# Patient Record
Sex: Male | Born: 1955 | Race: White | Hispanic: No | Marital: Single | Smoking: Former smoker
Health system: Southern US, Community
[De-identification: ages and names within clinical notes are randomized; demographics above are authoritative.]

## PROBLEM LIST (undated history)

## (undated) DIAGNOSIS — I10 Essential (primary) hypertension: Secondary | ICD-10-CM

## (undated) DIAGNOSIS — G629 Polyneuropathy, unspecified: Secondary | ICD-10-CM

## (undated) DIAGNOSIS — I4891 Unspecified atrial fibrillation: Secondary | ICD-10-CM

## (undated) DIAGNOSIS — K7460 Unspecified cirrhosis of liver: Secondary | ICD-10-CM

## (undated) DIAGNOSIS — I639 Cerebral infarction, unspecified: Secondary | ICD-10-CM

## (undated) DIAGNOSIS — G2581 Restless legs syndrome: Secondary | ICD-10-CM

## (undated) DIAGNOSIS — N400 Enlarged prostate without lower urinary tract symptoms: Secondary | ICD-10-CM

## (undated) DIAGNOSIS — I499 Cardiac arrhythmia, unspecified: Secondary | ICD-10-CM

## (undated) DIAGNOSIS — M5126 Other intervertebral disc displacement, lumbar region: Secondary | ICD-10-CM

## (undated) DIAGNOSIS — E871 Hypo-osmolality and hyponatremia: Secondary | ICD-10-CM

## (undated) DIAGNOSIS — Z8673 Personal history of transient ischemic attack (TIA), and cerebral infarction without residual deficits: Secondary | ICD-10-CM

## (undated) DIAGNOSIS — E876 Hypokalemia: Secondary | ICD-10-CM

## (undated) DIAGNOSIS — I69359 Hemiplegia and hemiparesis following cerebral infarction affecting unspecified side: Secondary | ICD-10-CM

## (undated) DIAGNOSIS — K219 Gastro-esophageal reflux disease without esophagitis: Secondary | ICD-10-CM

## (undated) HISTORY — DX: Restless legs syndrome: G25.81

## (undated) HISTORY — DX: Unspecified atrial fibrillation: I48.91

## (undated) HISTORY — DX: Unspecified cirrhosis of liver: K74.60

## (undated) HISTORY — DX: Hemiplegia and hemiparesis following cerebral infarction affecting unspecified side: I69.359

## (undated) HISTORY — DX: Hypo-osmolality and hyponatremia: E87.1

## (undated) HISTORY — DX: Polyneuropathy, unspecified: G62.9

## (undated) HISTORY — PX: TONSILLECTOMY: SUR1361

## (undated) HISTORY — PX: FRACTURE SURGERY: SHX138

---
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
## 2002-01-11 ENCOUNTER — Inpatient Hospital Stay (HOSPITAL_COMMUNITY): Admission: EM | Admit: 2002-01-11 | Discharge: 2002-01-12 | Payer: Self-pay | Admitting: Emergency Medicine

## 2002-01-11 ENCOUNTER — Encounter: Payer: Self-pay | Admitting: Emergency Medicine

## 2002-06-22 ENCOUNTER — Emergency Department (HOSPITAL_COMMUNITY): Admission: EM | Admit: 2002-06-22 | Discharge: 2002-06-23 | Payer: Self-pay | Admitting: Emergency Medicine

## 2002-07-09 ENCOUNTER — Emergency Department (HOSPITAL_COMMUNITY): Admission: EM | Admit: 2002-07-09 | Discharge: 2002-07-09 | Payer: Self-pay | Admitting: Emergency Medicine

## 2003-05-14 ENCOUNTER — Emergency Department (HOSPITAL_COMMUNITY): Admission: AC | Admit: 2003-05-14 | Discharge: 2003-05-14 | Payer: Self-pay

## 2003-05-15 ENCOUNTER — Encounter: Payer: Self-pay | Admitting: Emergency Medicine

## 2006-03-28 ENCOUNTER — Emergency Department (HOSPITAL_COMMUNITY): Admission: EM | Admit: 2006-03-28 | Discharge: 2006-03-28 | Payer: Self-pay | Admitting: Emergency Medicine

## 2006-04-21 ENCOUNTER — Emergency Department (HOSPITAL_COMMUNITY): Admission: EM | Admit: 2006-04-21 | Discharge: 2006-04-21 | Payer: Self-pay | Admitting: Family Medicine

## 2006-12-18 ENCOUNTER — Emergency Department (HOSPITAL_COMMUNITY): Admission: EM | Admit: 2006-12-18 | Discharge: 2006-12-18 | Payer: Self-pay | Admitting: Emergency Medicine

## 2008-09-13 ENCOUNTER — Emergency Department (HOSPITAL_COMMUNITY): Admission: EM | Admit: 2008-09-13 | Discharge: 2008-09-13 | Payer: Self-pay | Admitting: Emergency Medicine

## 2011-02-22 NOTE — Discharge Summary (Signed)
[place]  Patient:    Jeffrey Randolph, SCHWERTNER Visit Number: [id number] MRN: [id number]          Service Type: EMS Location: [place] Attending Physician:  Lorre Nick Dictated by:   Guy Franco, P.A. [facility] Admit Date:  01/11/2002 Disc. Date: 01/12/02                    Referring Physician Discharge [place]  DISCHARGE DIAGNOSES: 1. Chest pain, resolved. 2. Hypertension. 3. Tachycardia. 4. History of anxiety.  HISTORY OF PRESENT ILLNESS:  [name] is a 55 year old male patient who was admitted on January 11, 2002, with substernal chest pain.  His EKG showed sinus tachycardia at a rate of 112 with no acute changes.  HOSPITAL COURSE:  He was kept overnight and ruled out with negative cardiac isoenzymes and troponins.  He is being discharged today for a stress Cardiolite in the office at 1 p.m.  He was noted to be tachycardic and hypertensive in the hospital.  DISPOSITION:  He was discharged to home.  DISCHARGE MEDICATIONS: 1. Enteric-coated aspirin 325 mg one p.o. q.d. 2. Lopressor 50 mg one p.o. b.i.d. 3. Sublingual nitroglycerin p.r.n. chest pain  ACTIVITY:  No strenuous activity.  DIET:  Remain on a low-fat diet.  SPECIAL INSTRUCTIONS:  Call for any questions or concerns.  FOLLOW-UP:  Follow up in the office on January 12, 2002, at 1 p.m. for a stress Cardiolite.  He has a follow-up appointment on January 18, 2002, at 12:15 p.m. for a blood pressure check. Dictated by:   Guy Franco, P.A. LHC Attending Physician:  Lorre Nick DD:  01/12/02 TD:  01/12/02 Job: [id number]

## 2011-07-12 ENCOUNTER — Other Ambulatory Visit (HOSPITAL_COMMUNITY): Payer: Self-pay

## 2011-07-12 ENCOUNTER — Inpatient Hospital Stay (HOSPITAL_COMMUNITY): Payer: Self-pay

## 2011-07-12 ENCOUNTER — Emergency Department (HOSPITAL_COMMUNITY): Payer: Self-pay

## 2011-07-12 ENCOUNTER — Inpatient Hospital Stay (HOSPITAL_COMMUNITY)
Admission: EM | Admit: 2011-07-12 | Discharge: 2011-07-15 | DRG: 065 | Disposition: A | Discharge status: Home / Self Care | Payer: Self-pay | Source: Ambulatory Visit | Attending: Neurology | Admitting: Neurology

## 2011-07-12 DIAGNOSIS — I6789 Other cerebrovascular disease: Secondary | ICD-10-CM

## 2011-07-12 DIAGNOSIS — I9589 Other hypotension: Secondary | ICD-10-CM | POA: Diagnosis present

## 2011-07-12 DIAGNOSIS — R209 Unspecified disturbances of skin sensation: Secondary | ICD-10-CM | POA: Diagnosis present

## 2011-07-12 DIAGNOSIS — M519 Unspecified thoracic, thoracolumbar and lumbosacral intervertebral disc disorder: Secondary | ICD-10-CM | POA: Diagnosis present

## 2011-07-12 DIAGNOSIS — G8190 Hemiplegia, unspecified affecting unspecified side: Secondary | ICD-10-CM | POA: Diagnosis present

## 2011-07-12 DIAGNOSIS — I619 Nontraumatic intracerebral hemorrhage, unspecified: Principal | ICD-10-CM | POA: Diagnosis present

## 2011-07-12 DIAGNOSIS — M549 Dorsalgia, unspecified: Secondary | ICD-10-CM | POA: Diagnosis present

## 2011-07-12 LAB — POCT I-STAT, CHEM 8
BUN: <3 mg/dL — ABNORMAL LOW (ref 6–23)
Calcium, Ion: 1.01 mmol/L — ABNORMAL LOW (ref 1.12–1.32)
Chloride: 103 meq/L (ref 96–112)
Creatinine, Ser: 1 mg/dL (ref 0.50–1.35)
Glucose, Bld: 111 mg/dL — ABNORMAL HIGH (ref 70–99)
HCT: 49 % (ref 39.0–52.0)
Hemoglobin: 16.7 g/dL (ref 13.0–17.0)
Potassium: 3.6 meq/L (ref 3.5–5.1)
Sodium: 139 meq/L (ref 135–145)
TCO2: 22 mmol/L (ref 0–100)

## 2011-07-12 LAB — DIFFERENTIAL
Basophils Absolute: 0 10*3/uL (ref 0.0–0.1)
Basophils Relative: 0 % (ref 0–1)
Eosinophils Absolute: 0 10*3/uL (ref 0.0–0.7)
Eosinophils Relative: 0 % (ref 0–5)
Lymphocytes Relative: 17 % (ref 12–46)
Lymphs Abs: 1.2 10*3/uL (ref 0.7–4.0)
Monocytes Absolute: 0.6 K/uL (ref 0.1–1.0)
Monocytes Relative: 9 % (ref 3–12)
Neutro Abs: 5.3 K/uL (ref 1.7–7.7)
Neutrophils Relative %: 74 % (ref 43–77)

## 2011-07-12 LAB — CBC
HCT: 45.4 % (ref 39.0–52.0)
Hemoglobin: 15.7 g/dL (ref 13.0–17.0)
MCH: 34.5 pg — ABNORMAL HIGH (ref 26.0–34.0)
MCHC: 34.6 g/dL (ref 30.0–36.0)
MCV: 99.8 fL (ref 78.0–100.0)
Platelets: 209 10*3/uL (ref 150–400)
RBC: 4.55 MIL/uL (ref 4.22–5.81)
RDW: 13.2 % (ref 11.5–15.5)
WBC: 7.2 10*3/uL (ref 4.0–10.5)

## 2011-07-12 LAB — COMPREHENSIVE METABOLIC PANEL
AST: 45 U/L — ABNORMAL HIGH (ref 0–37)
CO2: 24 mEq/L (ref 19–32)
Calcium: 8.9 mg/dL (ref 8.4–10.5)
Creatinine, Ser: 0.62 mg/dL (ref 0.50–1.35)
GFR calc Af Amer: >90 mL/min (ref >90)
GFR calc non Af Amer: >90 mL/min (ref >90)

## 2011-07-12 LAB — COMPREHENSIVE METABOLIC PANEL WITH GFR
ALT: 29 U/L (ref 0–53)
Albumin: 3.9 g/dL (ref 3.5–5.2)
Alkaline Phosphatase: 85 U/L (ref 39–117)
BUN: 5 mg/dL — ABNORMAL LOW (ref 6–23)
Chloride: 100 meq/L (ref 96–112)
Glucose, Bld: 110 mg/dL — ABNORMAL HIGH (ref 70–99)
Potassium: 3.6 meq/L (ref 3.5–5.1)
Sodium: 137 meq/L (ref 135–145)
Total Bilirubin: 0.4 mg/dL (ref 0.3–1.2)
Total Protein: 7.8 g/dL (ref 6.0–8.3)

## 2011-07-12 LAB — CK TOTAL AND CKMB (NOT AT ARMC)
CK, MB: 2.8 ng/mL (ref 0.3–4.0)
Relative Index: 1.8 (ref 0.0–2.5)
Total CK: 158 U/L (ref 7–232)

## 2011-07-12 LAB — APTT: aPTT: 29 seconds (ref 24–37)

## 2011-07-12 LAB — PROTIME-INR
INR: 1.06 (ref 0.00–1.49)
Prothrombin Time: 14 seconds (ref 11.6–15.2)

## 2011-07-12 LAB — TROPONIN I: Troponin I: <0.3 ng/mL (ref <0.30)

## 2011-07-12 MED ORDER — GADOBENATE DIMEGLUMINE 529 MG/ML IV SOLN
20.0000 mL | Freq: Once | INTRAVENOUS | Status: AC
  Administered 2011-07-12: 20 mL via INTRAVENOUS

## 2011-07-14 ENCOUNTER — Inpatient Hospital Stay (HOSPITAL_COMMUNITY): Payer: Self-pay

## 2011-07-14 LAB — BASIC METABOLIC PANEL
Calcium: 9.4 mg/dL (ref 8.4–10.5)
Creatinine, Ser: 0.8 mg/dL (ref 0.50–1.35)
GFR calc Af Amer: >90 mL/min (ref >90)

## 2011-07-14 LAB — CBC
MCH: 32.5 pg (ref 26.0–34.0)
MCV: 100.7 fL — ABNORMAL HIGH (ref 78.0–100.0)
Platelets: 197 10*3/uL (ref 150–400)
RDW: 13 % (ref 11.5–15.5)

## 2011-07-14 LAB — TSH: TSH: 1.337 u[IU]/mL (ref 0.350–4.500)

## 2011-07-15 DIAGNOSIS — I6330 Cerebral infarction due to thrombosis of unspecified cerebral artery: Secondary | ICD-10-CM

## 2011-07-16 NOTE — H&P (Signed)
[name], AL NO.:  [id number]  MEDICAL RECORD NO.:  [id number]  LOCATION:  3106                         FACILITY:  [facility]  PHYSICIAN:  Marlan Palau, M.D.  DATE OF BIRTH:  January 20, 1956  DATE OF ADMISSION:  07/12/2011 DATE OF DISCHARGE:                             HISTORY & PHYSICAL   HISTORY OF PRESENT ILLNESS:  Jeffrey Randolph is a 55 year old right-handed white male, born on [birth year], without significant past medical history.  This patient has no primary care physician, and is on no medications.  The patient was cutting grass today around 10 a.m. when he noted onset of left-sided numbness and weakness.  The patient was unable to get back on his riding mower and came to the hospital for an evaluation.  Code Stroke was called.  CT scan of the brain was done showing evidence of a small right basal ganglia hemorrhage.  The patient was noted to have mild left hemiparesis and hemisensory deficit.  The patient is being brought in for an evaluation.  NIH stroke scale score is 2.  The patient is not a t-PA candidate secondary to the intracranial hemorrhage.  PAST MEDICAL HISTORY:  Significant for: 1. New-onset right basal ganglia hemorrhage. 2. Tonsillectomy. 3. Chronic low back pain and lumbosacral spine fraction in the past.  MEDICATIONS:  The patient is on no medications.  ALLERGIES:  Again, states no known allergies.  SOCIAL HISTORY:  The patient is divorced, is not employed, and has 2 children who are alive and well.  [place].  Does not smoke, drinks alcohol on occasion.  FAMILY MEDICAL HISTORY:  Mother died with pancreatic cancer.  Father had died from sudden death, possible cardiac source.  The patient has 1 brother.  No sisters.  Brother has low back pain, coronary artery disease.  REVIEW OF SYSTEMS:  Notable for occasional chills associated with episodes of abdominal pain that occur off and on.  The  patient does note some left-sided numbness, weakness.  Denies headache, shortness of breath, chest pains, visual field changes, trouble swallowing, blackout episodes, dizziness.  The patient denies problems controlling the bowels or the bladder.  PHYSICAL EXAMINATION:  VITAL SIGNS:  Blood pressure is 158/87, heart rate 97, respiratory rate 20, temperature afebrile. GENERAL:  The patient is a fairly well-developed white male who is alert and cooperative at the time of examination. HEENT:  Head is atraumatic.  Eyes:  Pupils are equal, round, and reactive to light. NECK:  Supple.  No carotid bruits noted. RESPIRATORY:  Clear. CARDIOVASCULAR:  Regular rate and rhythm.  No obvious murmurs or rubs noted. EXTREMITIES:  Without significant edema. NEUROLOGIC:  Cranial nerves as above.  Facial symmetry is present.  The patient has good sensation of the face to pinprick and soft touch bilaterally.  He has good strength of facial muscles and muscles of head turn and shoulder shrug bilaterally.  The patient has evidence of decreased pinprick and vibratory sensation in the left arm and left leg as compared to the right.  The patient extinct to double simultaneous stimulation on the entire left side including the face.  The  patient has mild drift with the left upper extremity.  No drift is noted with the left lower extremity.  The patient has fairly good strength to direct testing of all 4 extremities.  Deep tendon reflexes are relatively symmetric.  Toes are neutral bilaterally.  The patient has no evidence of ataxia with finger-nose-finger, heel-to-shin bilaterally.  Gait was not tested.  LABORATORY DATA:  Laboratory values notable for a white count of 7.2, hemoglobin of 15.7, hematocrit of 45.4, MCV of 99.8, platelets of 209. INR of 1.06.  Sodium 137, potassium 3.6, chloride of 100, CO2 of 24, glucose of 110, BUN of 5, creatinine 0.62.  Total bili 0.4, alk phosphatase 85, SGOT of 45, SGPT of  29, total protein 7.8, albumin 3.9, calcium 8.9, CK of 158, MB fraction 2.8, troponin-I of less than 0.3.  IMPRESSION:  Onset of right basal ganglia hemorrhage.  The patient really has no significant risk factors for stroke.  Blood pressure is minimally elevated at this time.  The patient will be brought in for further management of the intracranial hemorrhage.  The patient will undergo carotid Doppler studies, 2-D echocardiogram, and MRI evaluation.  The patient will require physical and occupational therapy evaluation.  We will follow the patient's clinical course while in-house.  The patient will require a primary care physician following discharge.     Marlan Palau, M.D.     CKW/MEDQ  D:  07/12/2011  T:  07/12/2011  Job:  [id number]  Electronically Signed by Thana Farr M.D. on 07/16/2011 06:23:14 PM

## 2011-07-18 NOTE — Discharge Summary (Signed)
Jeffrey Randolph, Jeffrey Randolph                 ACCOUNT NO.:  [id number]  MEDICAL RECORD NO.:  [id number]  LOCATION:  3006                         FACILITY:  [facility]  PHYSICIAN:  Zedekiah Hinderman P. Pearlean Brownie, MD    DATE OF BIRTH:  1956/04/19  DATE OF ADMISSION:  07/12/2011 DATE OF DISCHARGE:  07/15/2011                              DISCHARGE SUMMARY   DISCHARGE DIAGNOSES: 1. Hypertensive right basal ganglia hemorrhage. 2. Chronic back pain with bulging L3-L4 disk. 3. Tonsillectomy. 4. History of lumbosacral spine fracture in the past. 5. L3-L4 and L4-L5 disk bulging. 6. Inflammatory Schmorl's node.  MEDICATIONS AT TIME OF DISCHARGE: 1. Hydrochlorothiazide 25 mg a day. 2. Ultram 50 mg 1-2 q.6 h. p.r.n. back pain.  STUDIES PERFORMED: 1. CT of the brain on admission shows acute right basal ganglia     hemorrhage.  Estimated volume 4 mL. No significant mass effect.  No     other acute abnormalities. 2. MRI of the brain shows acute right     hemisphere subcapsular hemorrhage likely hypertensive. 3. MRA of the brain is unremarkable.  Followup CT at 24 hours shows     stable unchanged right basal ganglia hemorrhage. 4. Shoulder.  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 5. MR of the lumbar spine showed L2-3 severe disk desiccation with     loss of height and Schmorl's node extending to the inferior L2     endplate.  Discogenic edema on both sides of the disk space,     prominent surrounding the Schmorl node suggesting acute or     inflammatory Schmorl's node.  Mild central and minimal right     foraminal stenosis.  L4-L5 mild-to-moderate bilateral foraminal     stenosis associated with bulging disks and facet hypertrophy.     L4-L5 shallow broad based posterior disk bulge with mild left and     moderate right foraminal stenosis secondary to facet disease and     bulging disk. 6. 2D echocardiogram shows EF of 60-65% with no     obvious source of embolus. 7. EKG not present in chart.  Her  telemetry strip showed normal sinus rhythm. 8. Carotid Doppler with no ICA stenosis.  LABORATORY STUDIES:  Vitamin B12 313, TSH 1.337, chemistry with glucose 103.5, creatinine 0.8, otherwise normal.  CBC with MCV 100.7, otherwise normal.  MRSA negative.  Chemistry normal.  Troponin normal. Coagulation studies on admission were normal.  Liver function tests with SGOT 45, SGPT 29, otherwise normal.  HISTORY OF PRESENT ILLNESS:  Mr. Saleh Ulbrich is a 55 year old right- handed Caucasian male without significant past medical history.  The patient has no primary care physician and is on no medicines.  The patient was cutting grass around 10 am when he noticed onset of left- sided numbness and weakness.  The patient was unable to get back on his riding mower and came to the hospital for evaluation.  Code stroke was called.  CT of the brain shows a small right basal ganglia hemorrhage. The patient was noted to have left hemiparesis and hemisensory deficit. The patient is being brought in for evaluation.  NIH stroke scale was 2. The patient is not  a tPA candidate secondary to intracranial hemorrhage. He was admitted to the hospital for further evaluation.  HOSPITAL COURSE:  Blood pressure on admission was relatively unremarkable 158/81.  His hemorrhage was felt to be likely related to hypertension, though his blood pressure remained relatively stable in the hospital ranging from 123-149.  He was started on hydrochlorothiazide for blood pressure management.  The patient also complained of some back pain.  He was found to have L3-4 and L4-5 bulging disk.  He was recommended to follow up with [facility] and message has been left for appointment.  PT and OT evaluated the patient in hospital, having no therapy needs.  He was placed on Ultram for back pain and again will follow up with [facility]surgery.  CONDITIONS AT THE TIME OF DISCHARGE:  The patient is alert and oriented x3.   Normal speech.  Normal language.  Eye movements are full.  Face is symmetric.  There is no drift in his extremities and no focal weakness. His heart rate is regular.  His breath sounds are clear.  DISCHARGE PLAN: 1. Discharge home with family. 2. No aspirin or aspirin-containing products 3. Ultram for back pain.  FOLLOWUP: 1. [facility].  Message has been left for new patient     appointment. 2. Follow up Dr. Pearlean Brownie in his office in 2 months. 3. Follow up primary care physician in 1 month.     Annie Main, N.P.   ______________________________ Sunny Schlein. Pearlean Brownie, MD    SB/MEDQ  D:  07/15/2011  T:  07/16/2011  Job:  [id number]  cc:   Zyrell Carmean P. Pearlean Brownie, MD  Electronically Signed by Annie Main N.P. on 07/18/2011 04:11:18 PM Electronically Signed by Delia Heady MD on 07/18/2011 10:07:58 PM

## 2011-08-03 ENCOUNTER — Emergency Department (HOSPITAL_COMMUNITY)
Admission: EM | Admit: 2011-08-03 | Discharge: 2011-08-03 | Disposition: A | Discharge status: Home / Self Care | Payer: Self-pay | Attending: Emergency Medicine | Admitting: Emergency Medicine

## 2011-08-03 ENCOUNTER — Emergency Department (HOSPITAL_COMMUNITY): Payer: Self-pay

## 2011-08-03 DIAGNOSIS — R232 Flushing: Secondary | ICD-10-CM | POA: Insufficient documentation

## 2011-08-03 DIAGNOSIS — I10 Essential (primary) hypertension: Secondary | ICD-10-CM | POA: Insufficient documentation

## 2011-08-03 DIAGNOSIS — Z8673 Personal history of transient ischemic attack (TIA), and cerebral infarction without residual deficits: Secondary | ICD-10-CM | POA: Insufficient documentation

## 2011-08-03 DIAGNOSIS — E876 Hypokalemia: Secondary | ICD-10-CM | POA: Insufficient documentation

## 2011-08-03 DIAGNOSIS — M549 Dorsalgia, unspecified: Secondary | ICD-10-CM | POA: Insufficient documentation

## 2011-08-03 DIAGNOSIS — G8929 Other chronic pain: Secondary | ICD-10-CM | POA: Insufficient documentation

## 2011-08-03 DIAGNOSIS — R262 Difficulty in walking, not elsewhere classified: Secondary | ICD-10-CM | POA: Insufficient documentation

## 2011-08-03 DIAGNOSIS — Z79899 Other long term (current) drug therapy: Secondary | ICD-10-CM | POA: Insufficient documentation

## 2011-08-03 DIAGNOSIS — R4701 Aphasia: Secondary | ICD-10-CM | POA: Insufficient documentation

## 2011-08-03 LAB — POCT I-STAT, CHEM 8
Calcium, Ion: 1.04 mmol/L — ABNORMAL LOW (ref 1.12–1.32)
Glucose, Bld: 83 mg/dL (ref 70–99)
HCT: 48 % (ref 39.0–52.0)
Hemoglobin: 16.3 g/dL (ref 13.0–17.0)

## 2011-08-03 LAB — URINALYSIS, ROUTINE W REFLEX MICROSCOPIC
Bilirubin Urine: NEGATIVE
Ketones, ur: NEGATIVE mg/dL
Nitrite: NEGATIVE
Urobilinogen, UA: 0.2 mg/dL (ref 0.0–1.0)
pH: 6 (ref 5.0–8.0)

## 2011-08-05 LAB — GLUCOSE, CAPILLARY: Glucose-Capillary: 97 mg/dL (ref 70–99)

## 2011-12-20 ENCOUNTER — Emergency Department (HOSPITAL_COMMUNITY)
Admission: EM | Admit: 2011-12-20 | Discharge: 2011-12-20 | Disposition: A | Discharge status: Home / Self Care | Payer: Self-pay | Attending: Emergency Medicine | Admitting: Emergency Medicine

## 2011-12-20 ENCOUNTER — Encounter (HOSPITAL_COMMUNITY): Payer: Self-pay | Admitting: Emergency Medicine

## 2011-12-20 ENCOUNTER — Emergency Department (HOSPITAL_COMMUNITY): Payer: Self-pay

## 2011-12-20 DIAGNOSIS — M545 Low back pain, unspecified: Secondary | ICD-10-CM | POA: Insufficient documentation

## 2011-12-20 DIAGNOSIS — M549 Dorsalgia, unspecified: Secondary | ICD-10-CM

## 2011-12-20 DIAGNOSIS — M62830 Muscle spasm of back: Secondary | ICD-10-CM

## 2011-12-20 DIAGNOSIS — Z79899 Other long term (current) drug therapy: Secondary | ICD-10-CM | POA: Insufficient documentation

## 2011-12-20 DIAGNOSIS — M5380 Other specified dorsopathies, site unspecified: Secondary | ICD-10-CM | POA: Insufficient documentation

## 2011-12-20 DIAGNOSIS — I10 Essential (primary) hypertension: Secondary | ICD-10-CM | POA: Insufficient documentation

## 2011-12-20 HISTORY — DX: Essential (primary) hypertension: I10

## 2011-12-20 MED ORDER — ONDANSETRON 4 MG PO TBDP
4.0000 mg | ORAL_TABLET | Freq: Once | ORAL | Status: AC
  Administered 2011-12-20: 4 mg via ORAL
  Filled 2011-12-20: qty 1

## 2011-12-20 MED ORDER — MORPHINE SULFATE 4 MG/ML IJ SOLN
4.0000 mg | Freq: Once | INTRAMUSCULAR | Status: AC
  Administered 2011-12-20: 4 mg via INTRAMUSCULAR
  Filled 2011-12-20: qty 1

## 2011-12-20 MED ORDER — OXYCODONE-ACETAMINOPHEN 5-325 MG PO TABS: 1.0000 | ORAL_TABLET | Freq: Four times a day (QID) | ORAL | Status: AC | PRN

## 2011-12-20 MED ORDER — DIAZEPAM 5 MG PO TABS: 5.0000 mg | ORAL_TABLET | Freq: Two times a day (BID) | ORAL | Status: AC

## 2011-12-20 MED ORDER — DIAZEPAM 5 MG PO TABS
5.0000 mg | ORAL_TABLET | Freq: Once | ORAL | Status: AC
  Administered 2011-12-20: 5 mg via ORAL
  Filled 2011-12-20: qty 1

## 2011-12-20 NOTE — ED Notes (Signed)
Pt states 8/10 sharp back pain. States pain becomes worse with movement. No signs of distress noted. Vital signs stable. Will continue to monitor.

## 2011-12-20 NOTE — ED Notes (Signed)
Patient walked off front porch on Wednesday and twisted wrong.  Pain increased yesterday, unable to control pain at home.  Patient is visably uncomfortable.

## 2011-12-20 NOTE — ED Notes (Signed)
Pt discharged home. Had no further questions. Vital signs stable. Instructed to follow up with orthopedic physician as referred.

## 2011-12-20 NOTE — ED Provider Notes (Signed)
History     CSN: [id number]  Arrival date & time 12/20/11  0454   First MD Initiated Contact with Patient 12/20/11 [phone]      Chief Complaint  Patient presents with  . Back Pain    (Consider location/radiation/quality/duration/timing/severity/associated sxs/prior treatment) HPI  Pt presents to the ED with complaints of back pain. He has a PMH of back pain once or twice before but never this significant. He states that he stepped off the porch yesterday and it incited severe pain. He was unable to get comfortable to sleep last night. The patient has tried Tramadol at home but without any relief. He is ambulatory. He does not have any increased weakness, aside from residual deficits from a previous stroke. He denies urinary/bowel incontinence. Patient appears uncomfortable but is acting appropriately and nontoxic appearing.   Past Medical History  Diagnosis Date  . Hypertension     No past surgical history on file.  No family history on file.  History  Substance Use Topics  . Smoking status: Former Games developer  . Smokeless tobacco: Not on file  . Alcohol Use: Yes      Review of Systems  All other systems reviewed and are negative.    Allergies  Review of patient's allergies indicates no known allergies.  Home Medications   Current Outpatient Rx  Name Route Sig Dispense Refill  . ACETAMINOPHEN 500 MG PO TABS Oral Take 500 mg by mouth every 6 (six) hours as needed. For pain    . HYDROCHLOROTHIAZIDE 25 MG PO TABS Oral Take 25 mg by mouth daily.    . TRAMADOL HCL 50 MG PO TABS Oral Take 50 mg by mouth every 6 (six) hours as needed. For pain    . DIAZEPAM 5 MG PO TABS Oral Take 1 tablet (5 mg total) by mouth 2 (two) times daily. 10 tablet 0  . OXYCODONE-ACETAMINOPHEN 5-325 MG PO TABS Oral Take 1 tablet by mouth every 6 (six) hours as needed for pain. 12 tablet 0    BP 148/82  Pulse 89  Temp 98.5 F (36.9 C)  Resp 20  SpO2 96%  Physical Exam  Nursing note and vitals  reviewed. Constitutional: He appears well-developed and well-nourished. No distress.  HENT:  Head: Normocephalic and atraumatic.  Eyes: Pupils are equal, round, and reactive to light.  Neck: Normal range of motion. Neck supple.  Cardiovascular: Normal rate and regular rhythm.   Pulmonary/Chest: Effort normal.  Abdominal: Soft.  Musculoskeletal:       Lumbar back: He exhibits decreased range of motion, tenderness (bilateral paraspinal tenderness), pain and spasm. He exhibits no bony tenderness, no swelling, no edema, no deformity, no laceration and normal pulse.       Back:  Neurological: He is alert.  Skin: Skin is warm and dry.    ED Course  Procedures (including critical care time)  Labs Reviewed - No data to display Dg Lumbar Spine Complete  12/20/2011  *RADIOLOGY REPORT*  Clinical Data: Low back pain, the  LUMBAR SPINE - COMPLETE 4+ VIEW  Comparison: The prior MRI from 07/14/2011.  Plain film exam from 09/13/2008.  Findings: No fracture.  No subluxation.  Loss of disc height with endplate spurring seen at L2-3.  There is some loss of disc height at L3-4, slightly progressed since 12/08 and nine.  Facets are well- aligned bilaterally.  SI joints have normal imaging features.  IMPRESSION: Bowel, the level the gyri disc disease.  No acute bony findings.  Original Report  Authenticated By: ERIC A. MANSELL, M.D.     1. Back muscle spasm   2. Back pain       MDM    Pt given 4mg  Morphine IM and 5mg  Valium in ED. Pts pain went from a 10/10 down to a 3/10. Lumbar xray non acute findings. Pt is not having weakness or neurological symptoms. He ambulated to xray and back admitting to having one spasm during the course of transit.  Will give patient referral to Ortho, Rx for Valium 5mg  (10 pills) and Percocets 5-325 (12 pills).   Pt has been advised of the symptoms that warrant their return to the ED. Patient has voiced understanding and has agreed to follow-up with the PCP or  specialist.         Dorthula Matas, PA 12/20/11 [phone]

## 2011-12-20 NOTE — Discharge Instructions (Signed)
Please follow-up with the orthopedist if your symptoms do not start to resolve in the next 2-3 days. If you develop loss of bowel or urinary control return to the ED as soon as possible for further evaluation. Please take the medications as prescribed as they can be dangerous if not taken appropriately.  Back Pain, Adult Low back pain is very common. About 1 in 5 people have back pain.The cause of low back pain is rarely dangerous. The pain often gets better over time.About half of people with a sudden onset of back pain feel better in just 2 weeks. About 8 in 10 people feel better by 6 weeks.  CAUSES Some common causes of back pain include:  Strain of the muscles or ligaments supporting the spine.   Wear and tear (degeneration) of the spinal discs.   Arthritis.   Direct injury to the back.  DIAGNOSIS Most of the time, the direct cause of low back pain is not known.However, back pain can be treated effectively even when the exact cause of the pain is unknown.Answering your caregiver's questions about your overall health and symptoms is one of the most accurate ways to make sure the cause of your pain is not dangerous. If your caregiver needs more information, he or she may order lab work or imaging tests (X-rays or MRIs).However, even if imaging tests show changes in your back, this usually does not require surgery. HOME CARE INSTRUCTIONS For many people, back pain returns.Since low back pain is rarely dangerous, it is often a condition that people can learn to [facility] their own.   Remain active. It is stressful on the back to sit or stand in one place. Do not sit, drive, or stand in one place for more than 30 minutes at a time. Take short walks on level surfaces as soon as pain allows.Try to increase the length of time you walk each day.   Do not stay in bed.Resting more than 1 or 2 days can delay your recovery.   Do not avoid exercise or work.Your body is made to move.It is not  dangerous to be active, even though your back may hurt.Your back will likely heal faster if you return to being active before your pain is gone.   Pay attention to your body when you bend and lift. Many people have less discomfortwhen lifting if they bend their knees, keep the load close to their bodies,and avoid twisting. Often, the most comfortable positions are those that put less stress on your recovering back.   Find a comfortable position to sleep. Use a firm mattress and lie on your side with your knees slightly bent. If you lie on your back, put a pillow under your knees.   Only take over-the-counter or prescription medicines as directed by your caregiver. Over-the-counter medicines to reduce pain and inflammation are often the most helpful.Your caregiver may prescribe muscle relaxant drugs.These medicines help dull your pain so you can more quickly return to your normal activities and healthy exercise.   Put ice on the injured area.   Put ice in a plastic bag.   Place a towel between your skin and the bag.   Leave the ice on for 15 to 20 minutes, 3 to 4 times a day for the first 2 to 3 days. After that, ice and heat may be alternated to reduce pain and spasms.   Ask your caregiver about trying back exercises and gentle massage. This may be of some benefit.  Avoid feeling anxious or stressed.Stress increases muscle tension and can worsen back pain.It is important to recognize when you are anxious or stressed and learn ways to manage it.Exercise is a great option.  SEEK MEDICAL CARE IF:  You have pain that is not relieved with rest or medicine.   You have pain that does not improve in 1 week.   You have new symptoms.   You are generally not feeling well.  SEEK IMMEDIATE MEDICAL CARE IF:   You have pain that radiates from your back into your legs.   You develop new bowel or bladder control problems.   You have unusual weakness or numbness in your arms or legs.    You develop nausea or vomiting.   You develop abdominal pain.   You feel faint.  Document Released: 09/23/2005 Document Revised: 09/12/2011 Document Reviewed: 02/11/2011 [facility] Patient Information 2012 [place], [place].Lumbosacral Strain Lumbosacral strain is one of the most common causes of back pain. There are many causes of back pain. Most are not serious conditions. CAUSES  Your backbone (spinal column) is made up of 24 main vertebral bodies, the sacrum, and the coccyx. These are held together by muscles and tough, fibrous tissue (ligaments). Nerve roots pass through the openings between the vertebrae. A sudden move or injury to the back may cause injury to, or pressure on, these nerves. This may result in localized back pain or pain movement (radiation) into the buttocks, down the leg, and into the foot. Sharp, shooting pain from the buttock down the back of the leg (sciatica) is frequently associated with a ruptured (herniated) disk. Pain may be caused by muscle spasm alone. Your caregiver can often find the cause of your pain by the details of your symptoms and an exam. In some cases, you may need tests (such as X-rays). Your caregiver will work with you to decide if any tests are needed based on your specific exam. HOME CARE INSTRUCTIONS   Avoid an underactive lifestyle. Active exercise, as directed by your caregiver, is your greatest weapon against back pain.   Avoid hard physical activities (tennis, racquetball, waterskiing) if you are not in proper physical condition for it. This may aggravate or create problems.   If you have a back problem, avoid sports requiring sudden body movements. Swimming and walking are generally safer activities.   Maintain good posture.   Avoid becoming overweight (obese).   Use bed rest for only the most extreme, sudden (acute) episode. Your caregiver will help you determine how much bed rest is necessary.   For acute conditions, you may put ice  on the injured area.   Put ice in a plastic bag.   Place a towel between your skin and the bag.   Leave the ice on for 15 to 20 minutes at a time, every 2 hours, or as needed.   After you are improved and more active, it may help to apply heat for 30 minutes before activities.  See your caregiver if you are having pain that lasts longer than expected. Your caregiver can advise appropriate exercises or therapy if needed. With conditioning, most back problems can be avoided. SEEK IMMEDIATE MEDICAL CARE IF:   You have numbness, tingling, weakness, or problems with the use of your arms or legs.   You experience severe back pain not relieved with medicines.   There is a change in bowel or bladder control.   You have increasing pain in any area of the body, including your belly (  abdomen).   You notice shortness of breath, dizziness, or feel faint.   You feel sick to your stomach (nauseous), are throwing up (vomiting), or become sweaty.   You notice discoloration of your toes or legs, or your feet get very cold.   Your back pain is getting worse.   You have a fever.  MAKE SURE YOU:   Understand these instructions.   Will watch your condition.   Will get help right away if you are not doing well or get worse.  Document Released: 07/03/2005 Document Revised: 09/12/2011 Document Reviewed: 12/23/2008 [facility] Patient Information 2012 [place], [place].

## 2011-12-21 NOTE — ED Provider Notes (Signed)
Medical screening examination/treatment/procedure(s) were performed by non-physician practitioner and as supervising physician I was immediately available for consultation/collaboration.   Nat Christen, MD 12/21/11 [phone]

## 2012-03-05 ENCOUNTER — Emergency Department (HOSPITAL_COMMUNITY): Payer: Self-pay

## 2012-03-05 ENCOUNTER — Encounter (HOSPITAL_COMMUNITY): Payer: Self-pay | Admitting: *Deleted

## 2012-03-05 ENCOUNTER — Emergency Department (HOSPITAL_COMMUNITY)
Admission: EM | Admit: 2012-03-05 | Discharge: 2012-03-05 | Disposition: A | Discharge status: Home / Self Care | Payer: Self-pay | Attending: Emergency Medicine | Admitting: Emergency Medicine

## 2012-03-05 DIAGNOSIS — M792 Neuralgia and neuritis, unspecified: Secondary | ICD-10-CM

## 2012-03-05 DIAGNOSIS — IMO0002 Reserved for concepts with insufficient information to code with codable children: Secondary | ICD-10-CM | POA: Insufficient documentation

## 2012-03-05 DIAGNOSIS — Z8673 Personal history of transient ischemic attack (TIA), and cerebral infarction without residual deficits: Secondary | ICD-10-CM | POA: Insufficient documentation

## 2012-03-05 DIAGNOSIS — Z79899 Other long term (current) drug therapy: Secondary | ICD-10-CM | POA: Insufficient documentation

## 2012-03-05 DIAGNOSIS — I10 Essential (primary) hypertension: Secondary | ICD-10-CM | POA: Insufficient documentation

## 2012-03-05 DIAGNOSIS — M79609 Pain in unspecified limb: Secondary | ICD-10-CM | POA: Insufficient documentation

## 2012-03-05 HISTORY — DX: Cerebral infarction, unspecified: I63.9

## 2012-03-05 MED ORDER — HYDROMORPHONE HCL PF 1 MG/ML IJ SOLN
1.0000 mg | Freq: Once | INTRAMUSCULAR | Status: AC
  Administered 2012-03-05: 1 mg via INTRAMUSCULAR
  Filled 2012-03-05: qty 1

## 2012-03-05 MED ORDER — OXYCODONE-ACETAMINOPHEN 5-325 MG PO TABS: 1.0000 | ORAL_TABLET | Freq: Four times a day (QID) | ORAL | Status: AC | PRN

## 2012-03-05 MED ORDER — GABAPENTIN 100 MG PO CAPS: 100.0000 mg | ORAL_CAPSULE | Freq: Three times a day (TID) | ORAL | Status: DC

## 2012-03-05 NOTE — ED Notes (Signed)
Patient had a stroke 07/2011 and ever since then his left side has been in pain.  Today his left side is worse.  Patient states that his left arm, foot, and leg hurts worse.  Patient states that when he tried to shave his left side of the face it did not feel right.  Patient went to bed last night and was in his normal and when he woke up this am his pain was worse than yesterday.  Alert and oriented x 3.

## 2012-03-05 NOTE — ED Notes (Signed)
Pt reports left arm and left leg pain and numbness.  PT states, "It feels like my foot is 10 times bigger than normal."

## 2012-03-05 NOTE — ED Notes (Signed)
Pt placed on monitor and a pillow placed under left arm for comfort

## 2012-03-05 NOTE — ED Notes (Signed)
Patient transported to X-ray 

## 2012-03-05 NOTE — ED Notes (Signed)
Pt being transported out of the department

## 2012-03-05 NOTE — ED Notes (Signed)
Pt returned from being out of the department

## 2012-03-05 NOTE — ED Provider Notes (Signed)
History     CSN: [id number]  Arrival date & time 03/05/12  1056   First MD Initiated Contact with Patient 03/05/12 1107      Chief Complaint  Patient presents with  . left side pain     (Consider location/radiation/quality/duration/timing/severity/associated sxs/prior treatment) HPI Pt presents with c/o pain in left side- primarily in arm.  Pt states he has had similar pain since having a stroke in 10/12.   He denies having had any rehab or pain medication for this pain.  Today the pain is worse than prior.  No weakness or numbness of extremities.  No headache.  Has been taking blood pressure medications as prescribed.  Denies chest pain or shortness of breath.  No changes in vision or speech.  There are no other associated systemic symptoms, there are no alleviating or modifying factors.    Past Medical History  Diagnosis Date  . Hypertension   . CVA (cerebral infarction)     History reviewed. No pertinent past surgical history.  History reviewed. No pertinent family history.  History  Substance Use Topics  . Smoking status: Former Games developer  . Smokeless tobacco: Not on file  . Alcohol Use: Yes      Review of Systems ROS reviewed and all otherwise negative except for mentioned in HPI  Allergies  Review of patient's allergies indicates no known allergies.  Home Medications   Current Outpatient Rx  Name Route Sig Dispense Refill  . LISINOPRIL 10 MG PO TABS Oral Take 10 mg by mouth daily.    Marland Kitchen GABAPENTIN 100 MG PO CAPS Oral Take 1 capsule (100 mg total) by mouth 3 (three) times daily. 90 capsule 0  . OXYCODONE-ACETAMINOPHEN 5-325 MG PO TABS Oral Take 1-2 tablets by mouth every 6 (six) hours as needed for pain. 15 tablet 0    BP 123/69  Pulse 97  Temp(Src) 98 F (36.7 C) (Oral)  Resp 18  SpO2 95% Vitals reviewed Physical Exam Physical Examination: General appearance - alert, uncomfortable appearing, and in no distress Mental status - alert, oriented to person,  place, and time Neck - supple, nontender Chest - clear to auscultation, no wheezes, rales or rhonchi, symmetric air entry Heart - normal rate, regular rhythm, normal S1, S2, no murmurs, rubs, clicks or gallops Abdomen - soft, nontender, nondistended, no masses or organomegaly Neurological - alert, oriented, normal speech, strength 4+/5 on left upper and lower extremities- baseline per patient, sensation intact, cranial nerves 2-12 tested and intact Musculoskeletal - no joint tenderness, deformity or swelling Extremities - peripheral pulses normal, no pedal edema, no clubbing or cyanosis Skin - normal coloration and turgor, no rashes  ED Course  Procedures (including critical care time)  Labs Reviewed - No data to display Ct Head Wo Contrast  03/05/2012  *RADIOLOGY REPORT*  Clinical Data: Left arm pain.  History of stroke.  CT HEAD WITHOUT CONTRAST  Technique:  Contiguous axial images were obtained from the base of the skull through the vertex without contrast.  Comparison: CT 08/03/2011  Findings: Generalized atrophy is present, unchanged.  Chronic infarct right periventricular white matter.  This is the site of prior hemorrhage.  No acute infarct.  Mild chronic microvascular ischemia in the white matter.  Negative for mass with or acute hemorrhage.  Calvarium is intact.  IMPRESSION: Generalized atrophy and chronic ischemic changes.  No acute abnormality.  Original Report Authenticated By: Camelia Phenes, M.D.     1. Neuropathic pain  MDM  Pt presents with c/o left arm and leg pain which has been chronic in nature since his prior CVA in 10/12.  Has taken tylenol in the past but has not had other pain control.  Will discharge with rx for neurontin and advised him to f/u with PMD for further management.  Pt given strict return precautions.  He is agreeable with this plan.         Ethelda Chick, MD 03/06/12 [phone]

## 2012-03-05 NOTE — Discharge Instructions (Signed)
Return to the ED with any concerns including changes in vision or speech, increased weakness of arms or legs, or any other alarming symptoms

## 2012-05-25 IMAGING — CT CT HEAD W/O CM
1 of 2 series · 13 of 30 positions shown, 17 images · non-contrast
Comparison: 07/14/2011 and earlier.

CLINICAL DATA: 55-year-old male with fall, aphasia.  Recent stroke.

CT HEAD WITHOUT CONTRAST
TECHNIQUE: Contiguous axial images were obtained from the base of
the skull through the vertex without contrast.

[Series 2: brain · axial · 0.47mm/px · z∈[+17,+139]mm · 13 of 28 slices shown, 17 images]
[im 2/28  brain]
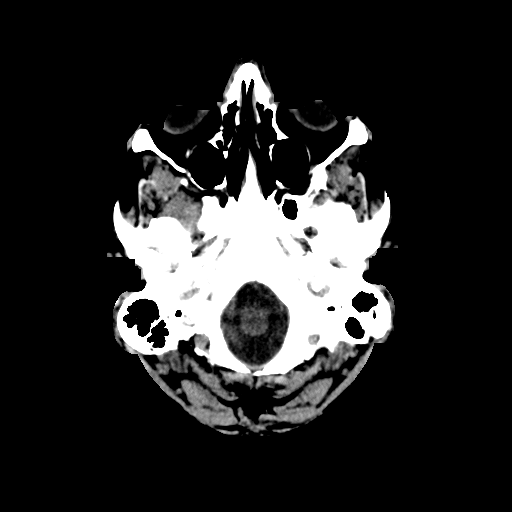
[im 2/28  bone]
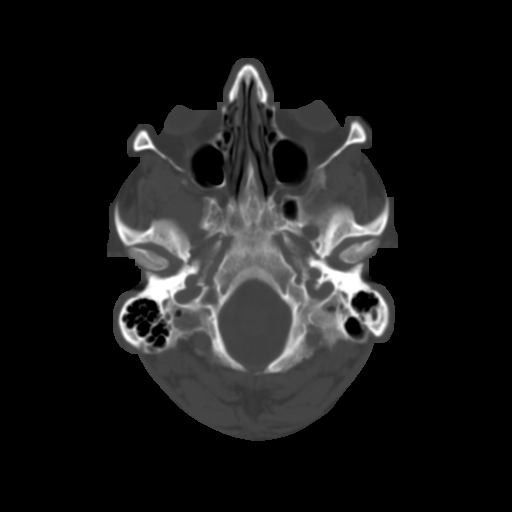
[im 4/28  brain]
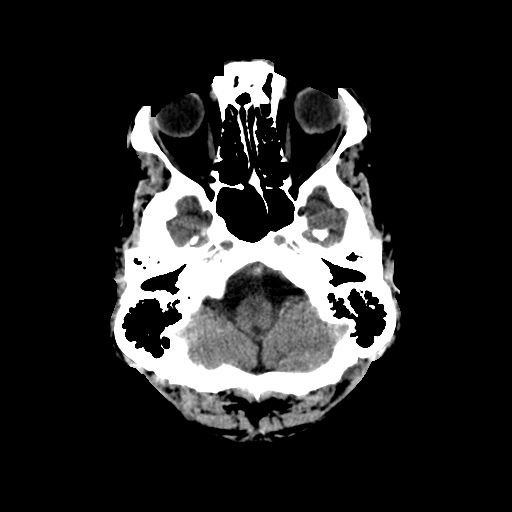
[im 6/28  brain]
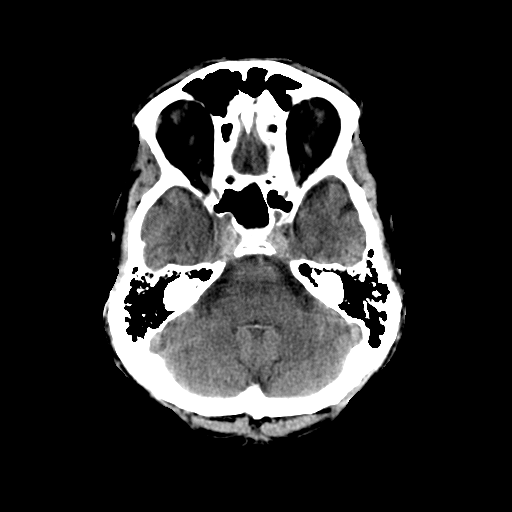
[im 8/28  brain]
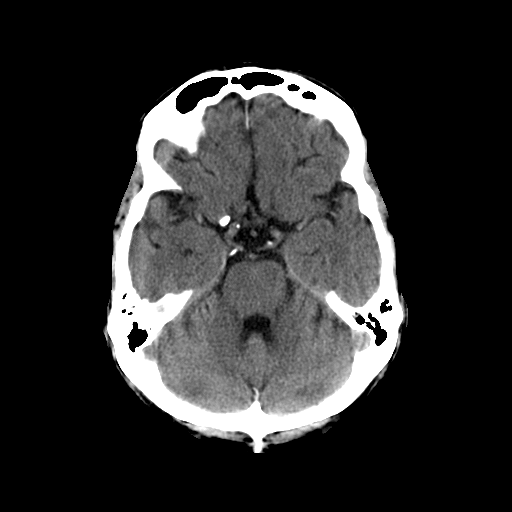
[im 10/28  brain]
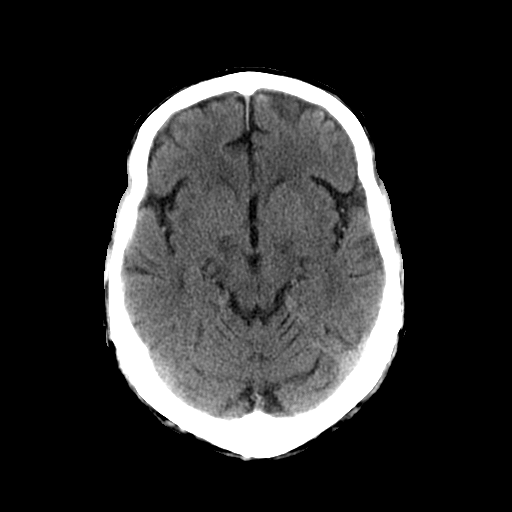
[im 10/28  bone]
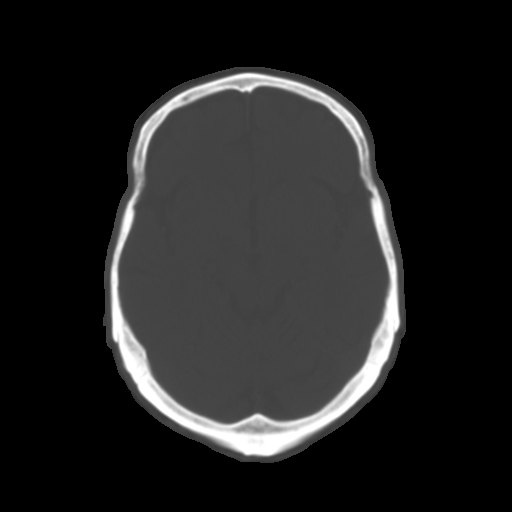
[im 12/28  brain]
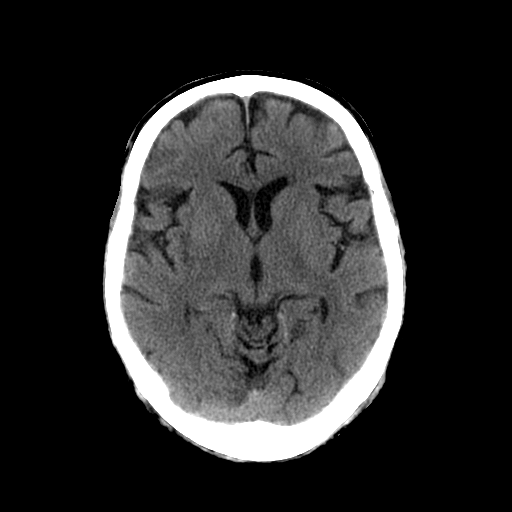
[im 14/28  brain]
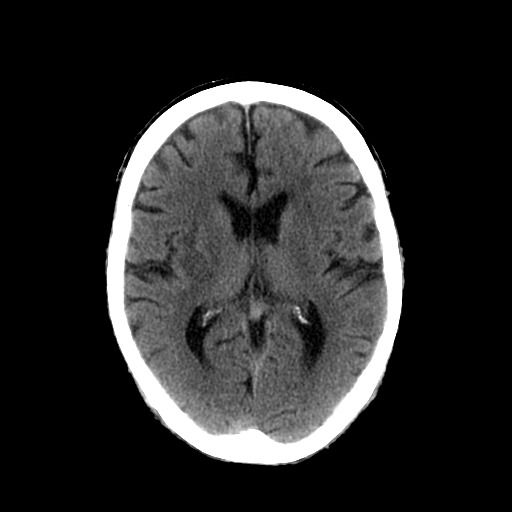
[im 16/28  brain]
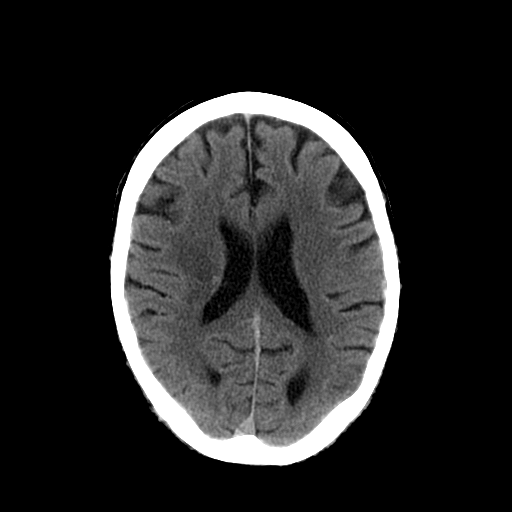
[im 18/28  brain]
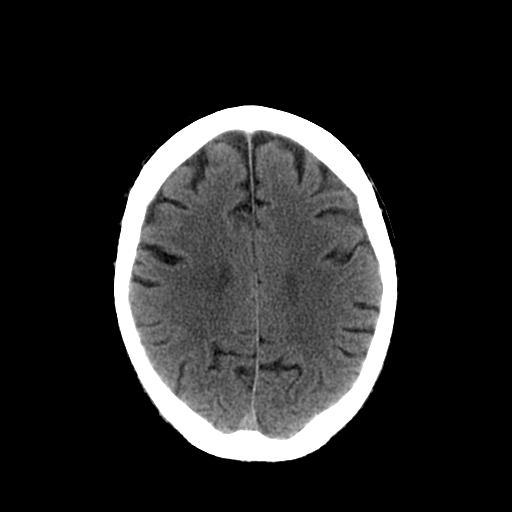
[im 18/28  bone]
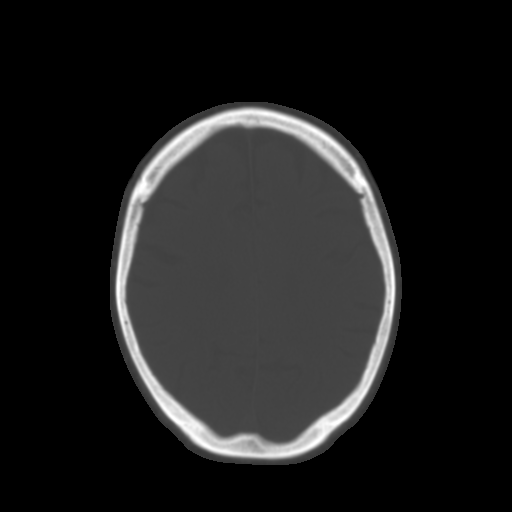
[im 20/28  brain]
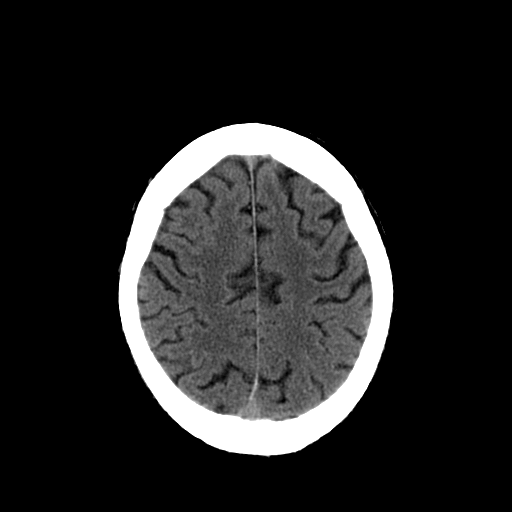
[im 22/28  brain]
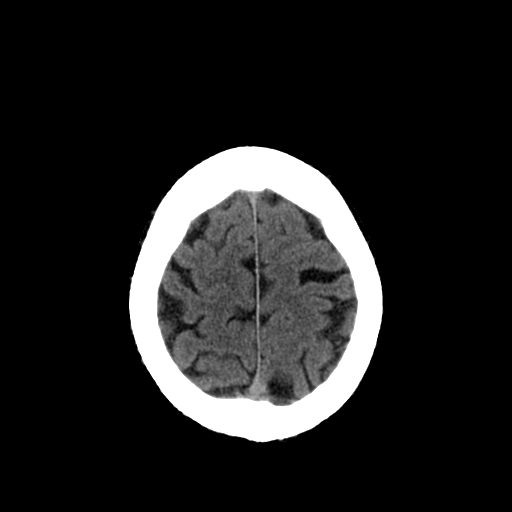
[im 24/28  brain]
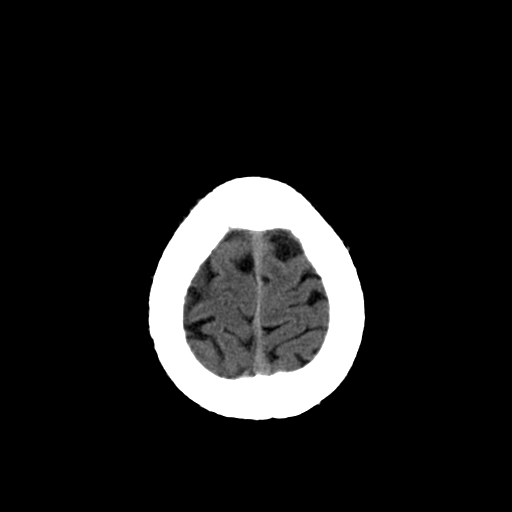
[im 26/28  brain]
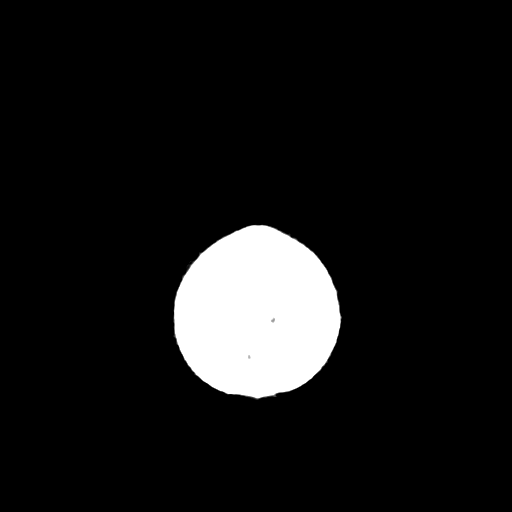
[im 26/28  bone]
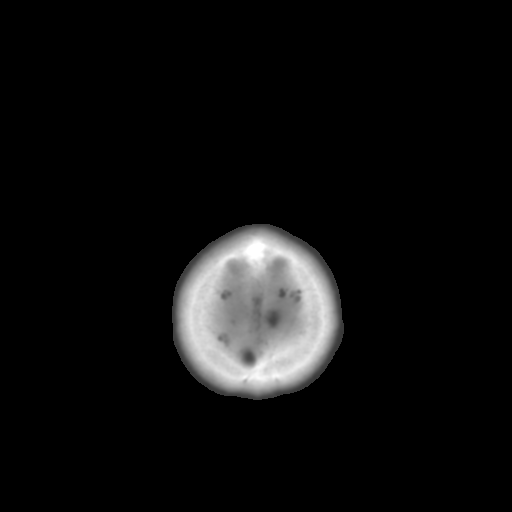

[13 of 30 positions shown; findings below may reference images not displayed]

FINDINGS: Visualized orbits and scalp soft tissues are within
normal limits.  Visualized paranasal sinuses and mastoids are
clear.  No acute osseous abnormality identified.

Interval expected evolution of previously seen right basal ganglia
hemorrhage, now with hypodensity.  No mass effect or new
intracranial mass lesion.

No ventriculomegaly. No acute intracranial hemorrhage identified.
Stable, normal gray-white matter differentiation elsewhere. No
evidence of cortically based acute infarction identified.  No
suspicious intracranial vascular hyperdensity.
IMPRESSION: Expected evolution of right basal ganglia hemorrhage from earlier
this month.  No new intracranial abnormality.

## 2012-10-11 IMAGING — CR DG LUMBAR SPINE COMPLETE 4+V
5 series · 5 of 5 positions shown · non-contrast
Comparison: The prior MRI from 07/14/2011.  Plain film exam from
09/13/2008.

CLINICAL DATA: Low back pain, the

LUMBAR SPINE - COMPLETE 4+ VIEW

[t lumbar spine ap]
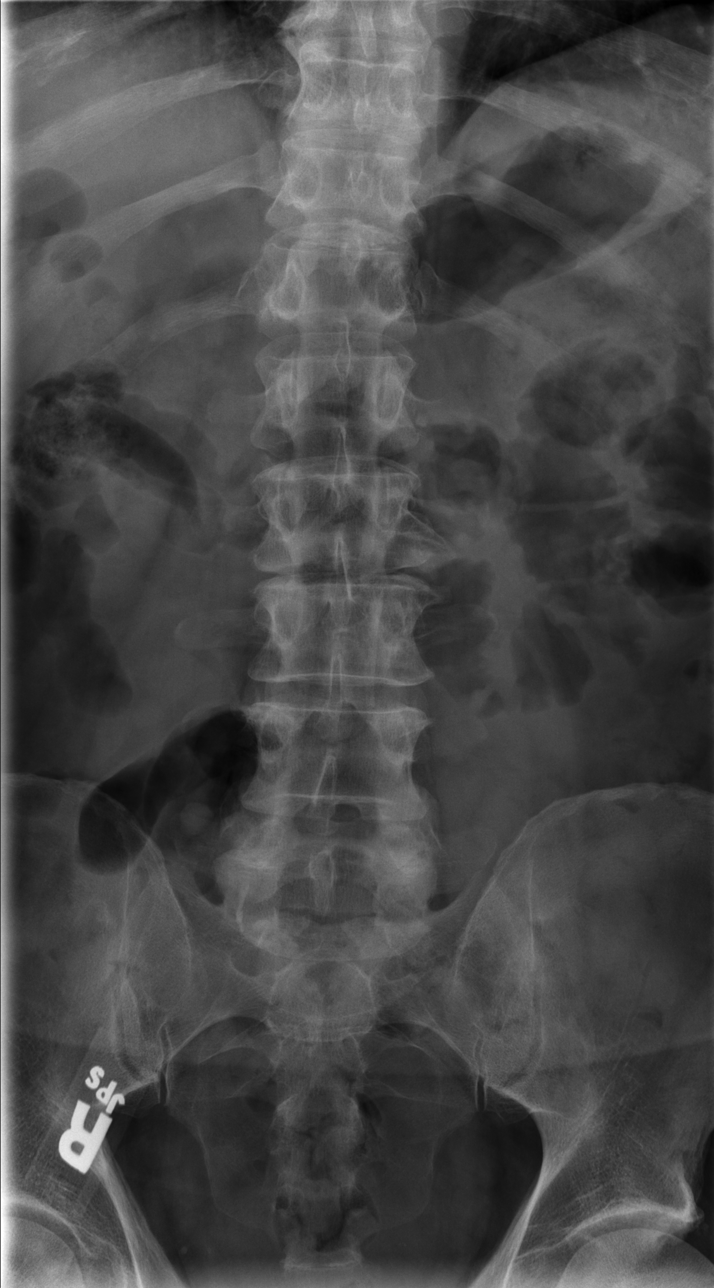

[t lumbar spine obl (1 of 2)]
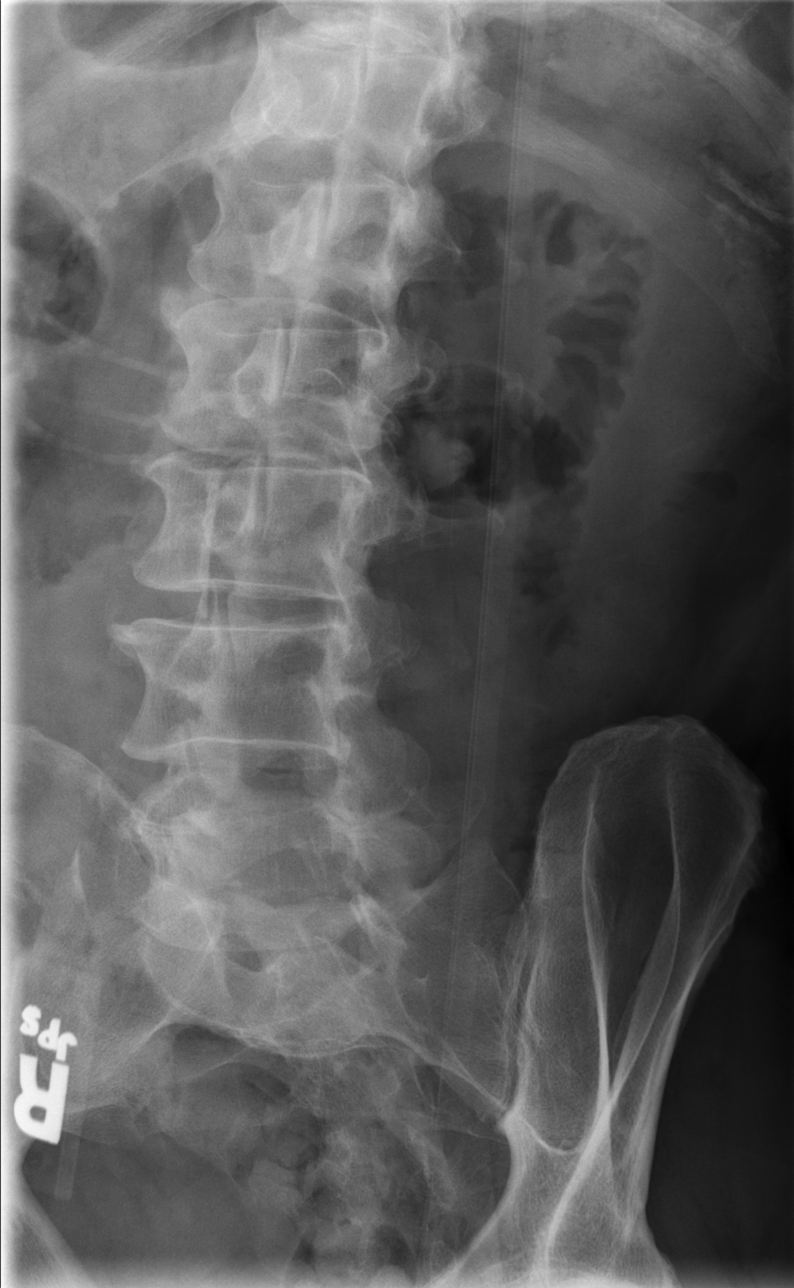

[t lumbar spine obl (2 of 2)]
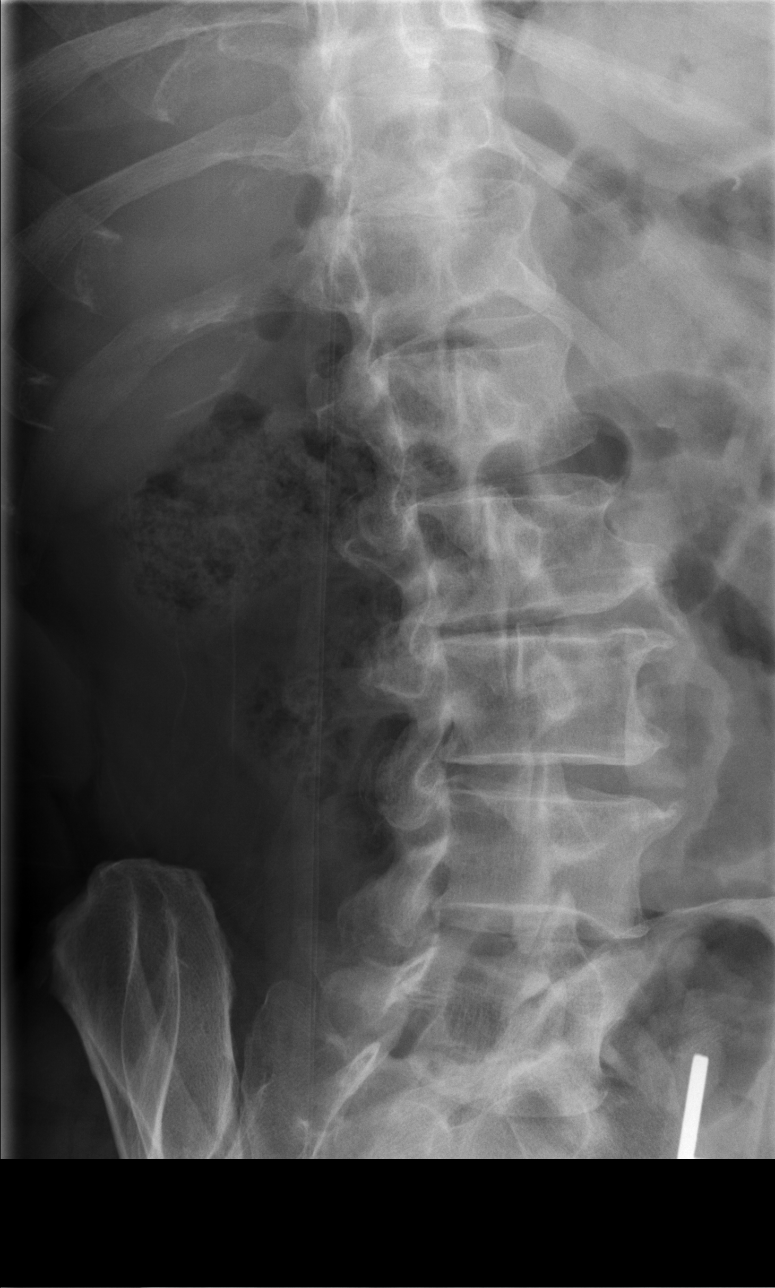

[t lumbar spine lat]
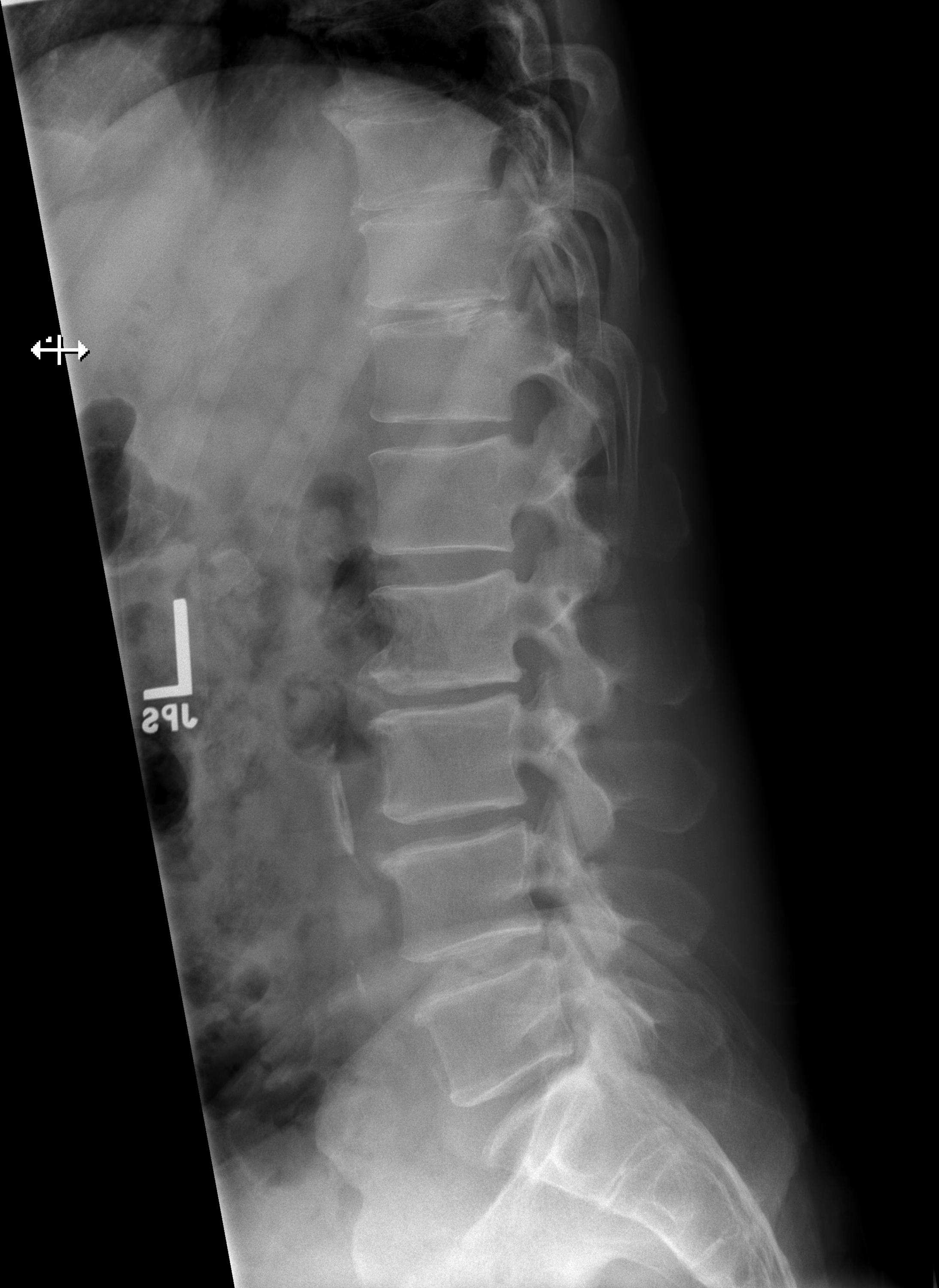

[t lumbar l-5 s-1 spot]
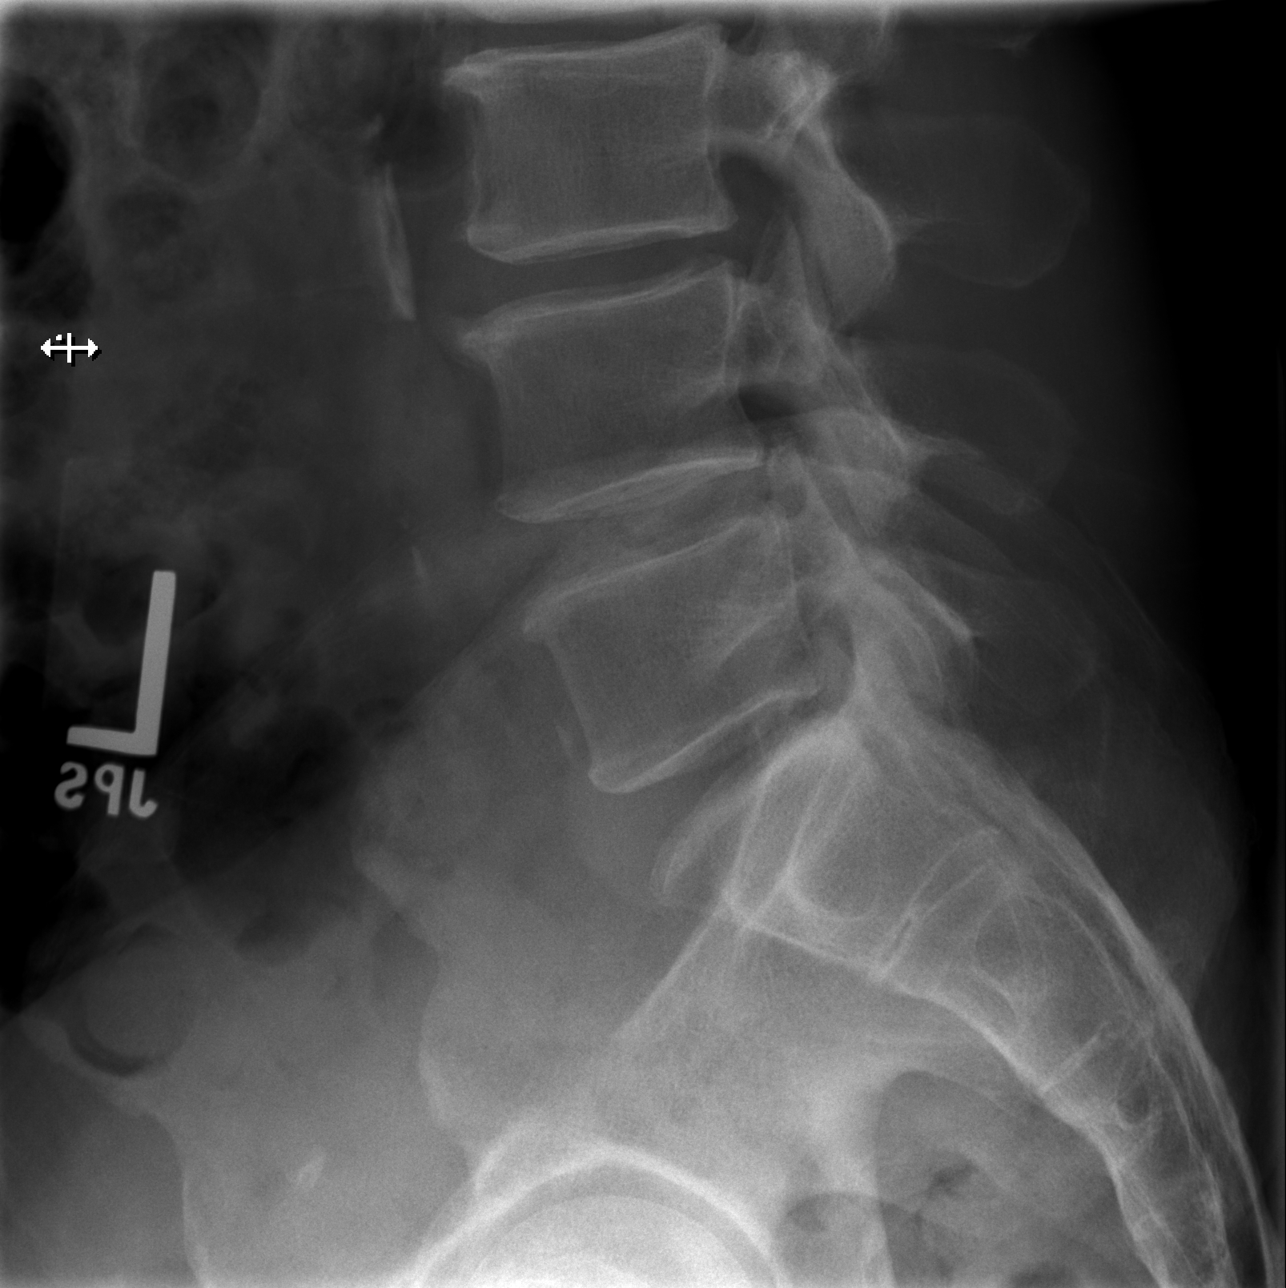

[5 of 5 positions shown; findings below may reference images not displayed]

FINDINGS: No fracture.  No subluxation.  Loss of disc height with
endplate spurring seen at L2-3.  There is some loss of disc height
at L3-4, slightly progressed since [DATE] and nine.  Facets are well-
aligned bilaterally.  SI joints have normal imaging features.
IMPRESSION: Bowel, the level the gyri disc disease.  No acute bony findings.

## 2014-03-30 ENCOUNTER — Emergency Department (HOSPITAL_COMMUNITY)
Admission: EM | Admit: 2014-03-30 | Discharge: 2014-03-30 | Discharge status: Against Medical Advice | Payer: Medicaid Other | Attending: Emergency Medicine | Admitting: Emergency Medicine

## 2014-03-30 ENCOUNTER — Encounter (HOSPITAL_COMMUNITY): Payer: Self-pay | Admitting: Emergency Medicine

## 2014-03-30 DIAGNOSIS — G8929 Other chronic pain: Secondary | ICD-10-CM | POA: Insufficient documentation

## 2014-03-30 DIAGNOSIS — I10 Essential (primary) hypertension: Secondary | ICD-10-CM | POA: Insufficient documentation

## 2014-03-30 DIAGNOSIS — M549 Dorsalgia, unspecified: Secondary | ICD-10-CM | POA: Insufficient documentation

## 2014-03-30 HISTORY — DX: Cerebral infarction, unspecified: I63.9

## 2014-03-30 NOTE — ED Notes (Addendum)
Pt has chronic back pain. Pt states it has gotten worse today but didn't do anything to make it worse. States pain shoots down from the lower back down his right leg.

## 2014-03-30 NOTE — ED Notes (Signed)
Pt requesting MRI, explained to pt MRI is not here at nights, pt admits to having pain med at home and that last dose was last night

## 2014-03-30 NOTE — ED Notes (Signed)
Pt left without informing staff of leaving, Pauline Ausammy Triplett, PA notified as well

## 2014-12-08 ENCOUNTER — Other Ambulatory Visit: Payer: Self-pay | Admitting: Neurosurgery

## 2014-12-08 DIAGNOSIS — M5126 Other intervertebral disc displacement, lumbar region: Secondary | ICD-10-CM | POA: Insufficient documentation

## 2015-01-02 ENCOUNTER — Encounter (HOSPITAL_COMMUNITY): Payer: Self-pay

## 2015-01-02 ENCOUNTER — Encounter (HOSPITAL_COMMUNITY)
Admission: RE | Admit: 2015-01-02 | Discharge: 2015-01-02 | Disposition: A | Discharge status: Home / Self Care | Payer: Medicaid Other | Source: Ambulatory Visit | Attending: Neurosurgery | Admitting: Neurosurgery

## 2015-01-02 DIAGNOSIS — I10 Essential (primary) hypertension: Secondary | ICD-10-CM | POA: Diagnosis not present

## 2015-01-02 DIAGNOSIS — Z01810 Encounter for preprocedural cardiovascular examination: Secondary | ICD-10-CM | POA: Diagnosis present

## 2015-01-02 DIAGNOSIS — Z01812 Encounter for preprocedural laboratory examination: Secondary | ICD-10-CM | POA: Insufficient documentation

## 2015-01-02 HISTORY — DX: Benign prostatic hyperplasia without lower urinary tract symptoms: N40.0

## 2015-01-02 HISTORY — DX: Gastro-esophageal reflux disease without esophagitis: K21.9

## 2015-01-02 LAB — SURGICAL PCR SCREEN
MRSA, PCR: NEGATIVE
STAPHYLOCOCCUS AUREUS: NEGATIVE

## 2015-01-02 LAB — BASIC METABOLIC PANEL
ANION GAP: 10 (ref 5–15)
BUN: 5 mg/dL — ABNORMAL LOW (ref 6–23)
CO2: 26 mmol/L (ref 19–32)
Calcium: 9.2 mg/dL (ref 8.4–10.5)
Chloride: 95 mmol/L — ABNORMAL LOW (ref 96–112)
Creatinine, Ser: 0.77 mg/dL (ref 0.50–1.35)
Glucose, Bld: 121 mg/dL — ABNORMAL HIGH (ref 70–99)
POTASSIUM: 3.9 mmol/L (ref 3.5–5.1)
SODIUM: 131 mmol/L — AB (ref 135–145)

## 2015-01-02 LAB — CBC
HEMATOCRIT: 45.4 % (ref 39.0–52.0)
HEMOGLOBIN: 16.2 g/dL (ref 13.0–17.0)
MCH: 35.5 pg — AB (ref 26.0–34.0)
MCHC: 35.7 g/dL (ref 30.0–36.0)
MCV: 99.6 fL (ref 78.0–100.0)
Platelets: 216 10*3/uL (ref 150–400)
RBC: 4.56 MIL/uL (ref 4.22–5.81)
RDW: 14 % (ref 11.5–15.5)
WBC: 8.8 10*3/uL (ref 4.0–10.5)

## 2015-01-02 NOTE — Progress Notes (Signed)
Anesthesia Chart Review:  Pt is 59 year old male scheduled for lumbar laminectomy/decompression microdiscectomy on 01/09/2015 with Dr. Wynetta Emeryram.   PMH includes: HTN, hemorrhagic stroke (2012), GERD. Former smoker. BMI 28.   Preoperative labs reviewed.    EKG: NSR. Cannot rule out Anterior infarct, age undetermined. Appears unchanged from 07/12/2011 EKG.   Echo 07/12/2011: -LV cavity size normal. Wall thickness was increased in a pattern of mild LVH. Systolic function was normal. Estimated EF 60-65%.   If no changes, I anticipate pt can proceed with surgery as scheduled.   Rica Mastngela Chardai Gangemi, FNP-BC [facility]Anesthesiology Phone: [phone] 01/02/2015 5:03 PM

## 2015-01-02 NOTE — Pre-Procedure Instructions (Addendum)
Jeffrey Randolph  01/02/2015   Your procedure is scheduled on:  Monday, April 4.  [facility] Admitting at 9:45 AM.   Call this number if you have problems the morning of surgery: [phone]               For any other questions, please call [phone], Monday - Friday 8 AM - 4 PM.   Remember:   Do not eat food or drink liquids after midnight Sunday, April 3.   Take these medicines the morning of surgery with A SIP OF WATER: Omeprazole.     Do not wear jewelry, make-up or nail polish.  Do not wear lotions, powders, or perfumes  Men may shave face and neck.  Do not bring valuables to the hospital.             [facility] any belongings or valuables.               Contacts, dentures or bridgework may not be worn into surgery.  Leave suitcase in the car. After surgery it may be brought to your room.  For patients admitted to the hospital, discharge time is determined by your treatment team.               Patients discharged the day of surgery will not be allowed to drive home.  Name and phone number of your driver:-   Special Instructions: Review  [facility] - Preparing For Surgery.   Please read over the following fact sheets that you were given: Pain Booklet, Coughing and Deep Breathing and Surgical Site Infection Prevention

## 2015-01-02 NOTE — Pre-Procedure Instructions (Signed)
[name]  01/02/2015   Your procedure is scheduled on:  Monday, April 4.  Report to [facility] Admitting at 9:45 AM.   Call this number if you have problems the morning of surgery: [phone]               For any other questions, please call [phone], Monday - Friday 8 AM - 4 PM.   Remember:   Do not eat food or drink liquids after midnight Sunday, April 3.   Take these medicines the morning of surgery with A SIP OF WATER: Omeprazole.     Do not wear jewelry, make-up or nail polish.  Do not wear lotions, powders, or perfumes  Men may shave face and neck.  Do not bring valuables to the hospital.             [facility] is not responsible for any belongings or valuables.               Contacts, dentures or bridgework may not be worn into surgery.  Leave suitcase in the car. After surgery it may be brought to your room.  For patients admitted to the hospital, discharge time is determined by your treatment team.               Patients discharged the day of surgery will not be allowed to drive home.  Name and phone number of your driver:-   Special Instructions: Review  [facility] - Preparing For Surgery.   Please read over the following fact sheets that you were given: Pain Booklet, Coughing and Deep Breathing and Surgical Site Infection Prevention  

## 2015-01-02 NOTE — Progress Notes (Signed)
Mr Jeffrey Randolph denies chest pain, light headedness ,sob.  Patient reports that the only residual from stroke is pain, "pain off over".  "My left foot feels like it is 3 times larger than it is and I limp."  Mr Jeffrey Randolph does not remember following up with the neurologist and thinks the only time he saw Dr Sharyn LullHarwani was when he was hospitalized for the stroke.

## 2015-01-08 MED ORDER — DEXAMETHASONE SODIUM PHOSPHATE 10 MG/ML IJ SOLN
10.0000 mg | INTRAMUSCULAR | Status: AC
  Administered 2015-01-09: 10 mg via INTRAVENOUS
  Filled 2015-01-08: qty 1

## 2015-01-08 MED ORDER — CEFAZOLIN SODIUM-DEXTROSE 2-3 GM-% IV SOLR
2.0000 g | INTRAVENOUS | Status: AC
  Administered 2015-01-09: 2 g via INTRAVENOUS
  Filled 2015-01-08: qty 50

## 2015-01-09 ENCOUNTER — Encounter (HOSPITAL_COMMUNITY)
Admission: RE | Disposition: A | Discharge status: Home / Self Care | Payer: Self-pay | Source: Ambulatory Visit | Attending: Neurosurgery

## 2015-01-09 ENCOUNTER — Ambulatory Visit (HOSPITAL_COMMUNITY): Payer: Medicaid Other | Admitting: Emergency Medicine

## 2015-01-09 ENCOUNTER — Encounter (HOSPITAL_COMMUNITY): Payer: Self-pay | Admitting: *Deleted

## 2015-01-09 ENCOUNTER — Ambulatory Visit (HOSPITAL_COMMUNITY): Payer: Medicaid Other

## 2015-01-09 ENCOUNTER — Ambulatory Visit (HOSPITAL_COMMUNITY): Payer: Medicaid Other | Admitting: Anesthesiology

## 2015-01-09 ENCOUNTER — Ambulatory Visit (HOSPITAL_COMMUNITY)
Admission: RE | Admit: 2015-01-09 | Discharge: 2015-01-10 | Disposition: A | Discharge status: Home / Self Care | Payer: Medicaid Other | Source: Ambulatory Visit | Attending: Neurosurgery | Admitting: Neurosurgery

## 2015-01-09 DIAGNOSIS — Z87891 Personal history of nicotine dependence: Secondary | ICD-10-CM | POA: Insufficient documentation

## 2015-01-09 DIAGNOSIS — K219 Gastro-esophageal reflux disease without esophagitis: Secondary | ICD-10-CM | POA: Diagnosis not present

## 2015-01-09 DIAGNOSIS — M79604 Pain in right leg: Secondary | ICD-10-CM | POA: Diagnosis present

## 2015-01-09 DIAGNOSIS — Z8673 Personal history of transient ischemic attack (TIA), and cerebral infarction without residual deficits: Secondary | ICD-10-CM | POA: Insufficient documentation

## 2015-01-09 DIAGNOSIS — M5126 Other intervertebral disc displacement, lumbar region: Secondary | ICD-10-CM

## 2015-01-09 DIAGNOSIS — M5116 Intervertebral disc disorders with radiculopathy, lumbar region: Secondary | ICD-10-CM | POA: Insufficient documentation

## 2015-01-09 DIAGNOSIS — N400 Enlarged prostate without lower urinary tract symptoms: Secondary | ICD-10-CM | POA: Insufficient documentation

## 2015-01-09 DIAGNOSIS — I10 Essential (primary) hypertension: Secondary | ICD-10-CM | POA: Diagnosis not present

## 2015-01-09 DIAGNOSIS — IMO0002 Reserved for concepts with insufficient information to code with codable children: Secondary | ICD-10-CM

## 2015-01-09 HISTORY — DX: Other intervertebral disc displacement, lumbar region: M51.26

## 2015-01-09 HISTORY — PX: LUMBAR LAMINECTOMY/DECOMPRESSION MICRODISCECTOMY: SHX5026

## 2015-01-09 SURGERY — LUMBAR LAMINECTOMY/DECOMPRESSION MICRODISCECTOMY 1 LEVEL
Anesthesia: General | Site: Back | Laterality: Right

## 2015-01-09 MED ORDER — LACTATED RINGERS IV SOLN
INTRAVENOUS | Status: DC
  Administered 2015-01-09 (×2): via INTRAVENOUS

## 2015-01-09 MED ORDER — PROPOFOL 10 MG/ML IV BOLUS
INTRAVENOUS | Status: AC
  Filled 2015-01-09: qty 20

## 2015-01-09 MED ORDER — 0.9 % SODIUM CHLORIDE (POUR BTL) OPTIME
TOPICAL | Status: DC | PRN
  Administered 2015-01-09: 1000 mL

## 2015-01-09 MED ORDER — ALUM & MAG HYDROXIDE-SIMETH 200-200-20 MG/5ML PO SUSP
30.0000 mL | Freq: Four times a day (QID) | ORAL | Status: DC | PRN
  Administered 2015-01-09: 30 mL via ORAL
  Filled 2015-01-09: qty 30

## 2015-01-09 MED ORDER — BUPIVACAINE HCL (PF) 0.25 % IJ SOLN
INTRAMUSCULAR | Status: DC | PRN
  Administered 2015-01-09: 10 mL
  Administered 2015-01-09: 5 mL

## 2015-01-09 MED ORDER — TAMSULOSIN HCL 0.4 MG PO CAPS
0.4000 mg | ORAL_CAPSULE | Freq: Every day | ORAL | Status: DC
  Administered 2015-01-09: 0.4 mg via ORAL
  Filled 2015-01-09 (×2): qty 1

## 2015-01-09 MED ORDER — PHENYLEPHRINE 40 MCG/ML (10ML) SYRINGE FOR IV PUSH (FOR BLOOD PRESSURE SUPPORT)
PREFILLED_SYRINGE | INTRAVENOUS | Status: AC
  Filled 2015-01-09: qty 10

## 2015-01-09 MED ORDER — DOCUSATE SODIUM 100 MG PO CAPS
100.0000 mg | ORAL_CAPSULE | Freq: Two times a day (BID) | ORAL | Status: DC
  Administered 2015-01-09: 100 mg via ORAL
  Filled 2015-01-09 (×2): qty 1

## 2015-01-09 MED ORDER — SODIUM CHLORIDE 0.9 % IV SOLN: 250.0000 mL | INTRAVENOUS | Status: DC

## 2015-01-09 MED ORDER — ACETAMINOPHEN 650 MG RE SUPP: 650.0000 mg | RECTAL | Status: DC | PRN

## 2015-01-09 MED ORDER — PHENOL 1.4 % MT LIQD: 1.0000 | OROMUCOSAL | Status: DC | PRN

## 2015-01-09 MED ORDER — FENTANYL CITRATE 0.05 MG/ML IJ SOLN
INTRAMUSCULAR | Status: DC | PRN
  Administered 2015-01-09: 150 ug via INTRAVENOUS

## 2015-01-09 MED ORDER — DEXTROSE 5 % IV SOLN
10.0000 mg | INTRAVENOUS | Status: DC | PRN
  Administered 2015-01-09: 20 ug/min via INTRAVENOUS

## 2015-01-09 MED ORDER — OXYCODONE-ACETAMINOPHEN 5-325 MG PO TABS
1.0000 | ORAL_TABLET | ORAL | Status: DC | PRN
  Administered 2015-01-09 – 2015-01-10 (×3): 1 via ORAL
  Filled 2015-01-09 (×2): qty 1
  Filled 2015-01-09: qty 2

## 2015-01-09 MED ORDER — SODIUM CHLORIDE 0.9 % IJ SOLN
3.0000 mL | Freq: Two times a day (BID) | INTRAMUSCULAR | Status: DC
  Administered 2015-01-09: 3 mL via INTRAVENOUS

## 2015-01-09 MED ORDER — EPHEDRINE SULFATE 50 MG/ML IJ SOLN
INTRAMUSCULAR | Status: DC | PRN
  Administered 2015-01-09: 10 mg via INTRAVENOUS

## 2015-01-09 MED ORDER — PANTOPRAZOLE SODIUM 40 MG PO TBEC: 40.0000 mg | DELAYED_RELEASE_TABLET | Freq: Every day | ORAL | Status: DC

## 2015-01-09 MED ORDER — PHENYLEPHRINE HCL 10 MG/ML IJ SOLN
INTRAMUSCULAR | Status: AC
  Filled 2015-01-09: qty 1

## 2015-01-09 MED ORDER — ROCURONIUM BROMIDE 50 MG/5ML IV SOLN
INTRAVENOUS | Status: AC
  Filled 2015-01-09: qty 3

## 2015-01-09 MED ORDER — LIDOCAINE HCL (CARDIAC) 20 MG/ML IV SOLN
INTRAVENOUS | Status: DC | PRN
  Administered 2015-01-09: 90 mg via INTRAVENOUS

## 2015-01-09 MED ORDER — THROMBIN 5000 UNITS EX SOLR
CUTANEOUS | Status: DC | PRN
  Administered 2015-01-09 (×2): 5000 [IU] via TOPICAL

## 2015-01-09 MED ORDER — CEFAZOLIN SODIUM 1-5 GM-% IV SOLN
1.0000 g | Freq: Three times a day (TID) | INTRAVENOUS | Status: AC
  Administered 2015-01-09 – 2015-01-10 (×2): 1 g via INTRAVENOUS
  Filled 2015-01-09 (×2): qty 50

## 2015-01-09 MED ORDER — EPHEDRINE SULFATE 50 MG/ML IJ SOLN
INTRAMUSCULAR | Status: AC
  Filled 2015-01-09: qty 2

## 2015-01-09 MED ORDER — LIDOCAINE HCL (CARDIAC) 20 MG/ML IV SOLN
INTRAVENOUS | Status: AC
  Filled 2015-01-09: qty 10

## 2015-01-09 MED ORDER — PHENYLEPHRINE HCL 10 MG/ML IJ SOLN
INTRAMUSCULAR | Status: DC | PRN
  Administered 2015-01-09 (×9): 40 ug via INTRAVENOUS

## 2015-01-09 MED ORDER — PROMETHAZINE HCL 25 MG/ML IJ SOLN: 6.2500 mg | INTRAMUSCULAR | Status: DC | PRN

## 2015-01-09 MED ORDER — LISINOPRIL 40 MG PO TABS
40.0000 mg | ORAL_TABLET | Freq: Every day | ORAL | Status: DC
  Filled 2015-01-09: qty 1

## 2015-01-09 MED ORDER — ARTIFICIAL TEARS OP OINT
TOPICAL_OINTMENT | OPHTHALMIC | Status: AC
  Filled 2015-01-09: qty 7

## 2015-01-09 MED ORDER — NEOSTIGMINE METHYLSULFATE 10 MG/10ML IV SOLN
INTRAVENOUS | Status: DC | PRN
Start: 2015-01-09 — End: 2015-01-09
  Administered 2015-01-09: 4 mg via INTRAVENOUS

## 2015-01-09 MED ORDER — SODIUM CHLORIDE 0.9 % IJ SOLN: 3.0000 mL | INTRAMUSCULAR | Status: DC | PRN

## 2015-01-09 MED ORDER — ROCURONIUM BROMIDE 100 MG/10ML IV SOLN
INTRAVENOUS | Status: DC | PRN
  Administered 2015-01-09: 50 mg via INTRAVENOUS

## 2015-01-09 MED ORDER — ONDANSETRON HCL 4 MG/2ML IJ SOLN: 4.0000 mg | INTRAMUSCULAR | Status: DC | PRN

## 2015-01-09 MED ORDER — HYDROCHLOROTHIAZIDE 25 MG PO TABS
25.0000 mg | ORAL_TABLET | Freq: Every day | ORAL | Status: DC
  Filled 2015-01-09: qty 1

## 2015-01-09 MED ORDER — SUCCINYLCHOLINE CHLORIDE 20 MG/ML IJ SOLN
INTRAMUSCULAR | Status: AC
  Filled 2015-01-09: qty 1

## 2015-01-09 MED ORDER — CYCLOBENZAPRINE HCL 10 MG PO TABS
10.0000 mg | ORAL_TABLET | Freq: Three times a day (TID) | ORAL | Status: DC | PRN
  Administered 2015-01-09 – 2015-01-10 (×2): 10 mg via ORAL
  Filled 2015-01-09 (×2): qty 1

## 2015-01-09 MED ORDER — ONDANSETRON HCL 4 MG/2ML IJ SOLN
INTRAMUSCULAR | Status: AC
  Filled 2015-01-09: qty 2

## 2015-01-09 MED ORDER — HYDROMORPHONE HCL 1 MG/ML IJ SOLN
INTRAMUSCULAR | Status: AC
  Filled 2015-01-09: qty 1

## 2015-01-09 MED ORDER — ARTIFICIAL TEARS OP OINT
TOPICAL_OINTMENT | OPHTHALMIC | Status: DC | PRN
  Administered 2015-01-09: 1 via OPHTHALMIC

## 2015-01-09 MED ORDER — HYDROMORPHONE HCL 1 MG/ML IJ SOLN: 0.5000 mg | INTRAMUSCULAR | Status: DC | PRN

## 2015-01-09 MED ORDER — PROPOFOL 10 MG/ML IV BOLUS
INTRAVENOUS | Status: DC | PRN
  Administered 2015-01-09: 170 mg via INTRAVENOUS

## 2015-01-09 MED ORDER — BACITRACIN 50000 UNITS IM SOLR
INTRAMUSCULAR | Status: DC | PRN
  Administered 2015-01-09: 500 mL

## 2015-01-09 MED ORDER — LISINOPRIL-HYDROCHLOROTHIAZIDE 20-12.5 MG PO TABS: 2.0000 | ORAL_TABLET | Freq: Every day | ORAL | Status: DC

## 2015-01-09 MED ORDER — MIDAZOLAM HCL 5 MG/5ML IJ SOLN
INTRAMUSCULAR | Status: DC | PRN
  Administered 2015-01-09: 2 mg via INTRAVENOUS

## 2015-01-09 MED ORDER — GLYCOPYRROLATE 0.2 MG/ML IJ SOLN
INTRAMUSCULAR | Status: AC
  Filled 2015-01-09: qty 2

## 2015-01-09 MED ORDER — ONDANSETRON HCL 4 MG/2ML IJ SOLN
INTRAMUSCULAR | Status: DC | PRN
  Administered 2015-01-09: 4 mg via INTRAVENOUS

## 2015-01-09 MED ORDER — MIDAZOLAM HCL 2 MG/2ML IJ SOLN
INTRAMUSCULAR | Status: AC
  Filled 2015-01-09: qty 2

## 2015-01-09 MED ORDER — MENTHOL 3 MG MT LOZG
1.0000 | LOZENGE | OROMUCOSAL | Status: DC | PRN
  Filled 2015-01-09: qty 9

## 2015-01-09 MED ORDER — HYDROMORPHONE HCL 1 MG/ML IJ SOLN
0.2500 mg | INTRAMUSCULAR | Status: DC | PRN
  Administered 2015-01-09 (×2): 0.5 mg via INTRAVENOUS

## 2015-01-09 MED ORDER — HEMOSTATIC AGENTS (NO CHARGE) OPTIME
TOPICAL | Status: DC | PRN
  Administered 2015-01-09: 1 via TOPICAL

## 2015-01-09 MED ORDER — GLYCOPYRROLATE 0.2 MG/ML IJ SOLN
INTRAMUSCULAR | Status: DC | PRN
  Administered 2015-01-09: 0.6 mg via INTRAVENOUS

## 2015-01-09 MED ORDER — FENTANYL CITRATE 0.05 MG/ML IJ SOLN
INTRAMUSCULAR | Status: AC
  Filled 2015-01-09: qty 5

## 2015-01-09 MED ORDER — ACETAMINOPHEN 325 MG PO TABS: 650.0000 mg | ORAL_TABLET | ORAL | Status: DC | PRN

## 2015-01-09 MED ORDER — LIDOCAINE-EPINEPHRINE 1 %-1:100000 IJ SOLN
INTRAMUSCULAR | Status: DC | PRN
  Administered 2015-01-09: 5 mL

## 2015-01-09 SURGICAL SUPPLY — 58 items
ADH SKN CLS LQ APL DERMABOND (GAUZE/BANDAGES/DRESSINGS) ×1
APL SKNCLS STERI-STRIP NONHPOA (GAUZE/BANDAGES/DRESSINGS) ×1
BAG DECANTER FOR FLEXI CONT (MISCELLANEOUS) ×3 IMPLANT
BENZOIN TINCTURE PRP APPL 2/3 (GAUZE/BANDAGES/DRESSINGS) ×3 IMPLANT
BLADE CLIPPER SURG (BLADE) IMPLANT
BLADE SURG 11 STRL SS (BLADE) ×3 IMPLANT
BRUSH SCRUB EZ PLAIN DRY (MISCELLANEOUS) ×3 IMPLANT
BUR MATCHSTICK NEURO 3.0 LAGG (BURR) ×3 IMPLANT
BUR PRECISION FLUTE 6.0 (BURR) ×3 IMPLANT
CANISTER SUCT 3000ML PPV (MISCELLANEOUS) ×3 IMPLANT
CLOSURE WOUND 1/2 X4 (GAUZE/BANDAGES/DRESSINGS) ×1
CONT SPEC 4OZ CLIKSEAL STRL BL (MISCELLANEOUS) ×3 IMPLANT
DECANTER SPIKE VIAL GLASS SM (MISCELLANEOUS) ×3 IMPLANT
DERMABOND ADHESIVE PROPEN (GAUZE/BANDAGES/DRESSINGS) ×2
DERMABOND ADVANCED .7 DNX6 (GAUZE/BANDAGES/DRESSINGS) IMPLANT
DRAPE LAPAROTOMY 100X72X124 (DRAPES) ×3 IMPLANT
DRAPE MICROSCOPE LEICA (MISCELLANEOUS) ×3 IMPLANT
DRAPE POUCH INSTRU U-SHP 10X18 (DRAPES) ×3 IMPLANT
DRAPE PROXIMA HALF (DRAPES) IMPLANT
DRAPE SURG 17X23 STRL (DRAPES) ×3 IMPLANT
DRSG OPSITE 4X5.5 SM (GAUZE/BANDAGES/DRESSINGS) ×3 IMPLANT
DRSG OPSITE POSTOP 4X6 (GAUZE/BANDAGES/DRESSINGS) ×2 IMPLANT
DURAPREP 26ML APPLICATOR (WOUND CARE) ×3 IMPLANT
ELECT REM PT RETURN 9FT ADLT (ELECTROSURGICAL) ×3
ELECTRODE REM PT RTRN 9FT ADLT (ELECTROSURGICAL) ×1 IMPLANT
GAUZE SPONGE 4X4 12PLY STRL (GAUZE/BANDAGES/DRESSINGS) ×3 IMPLANT
GAUZE SPONGE 4X4 16PLY XRAY LF (GAUZE/BANDAGES/DRESSINGS) IMPLANT
GLOVE BIO SURGEON STRL SZ8 (GLOVE) ×3 IMPLANT
GLOVE EXAM NITRILE LRG STRL (GLOVE) IMPLANT
GLOVE EXAM NITRILE MD LF STRL (GLOVE) IMPLANT
GLOVE EXAM NITRILE XL STR (GLOVE) IMPLANT
GLOVE EXAM NITRILE XS STR PU (GLOVE) IMPLANT
GLOVE INDICATOR 8.5 STRL (GLOVE) ×3 IMPLANT
GOWN STRL REUS W/ TWL LRG LVL3 (GOWN DISPOSABLE) ×1 IMPLANT
GOWN STRL REUS W/ TWL XL LVL3 (GOWN DISPOSABLE) ×2 IMPLANT
GOWN STRL REUS W/TWL 2XL LVL3 (GOWN DISPOSABLE) IMPLANT
GOWN STRL REUS W/TWL LRG LVL3 (GOWN DISPOSABLE) ×3
GOWN STRL REUS W/TWL XL LVL3 (GOWN DISPOSABLE) ×6
KIT BASIN OR (CUSTOM PROCEDURE TRAY) ×3 IMPLANT
KIT ROOM TURNOVER OR (KITS) ×3 IMPLANT
LIQUID BAND (GAUZE/BANDAGES/DRESSINGS) ×3 IMPLANT
NDL SPNL 22GX3.5 QUINCKE BK (NEEDLE) ×1 IMPLANT
NEEDLE HYPO 22GX1.5 SAFETY (NEEDLE) ×3 IMPLANT
NEEDLE SPNL 22GX3.5 QUINCKE BK (NEEDLE) ×3 IMPLANT
NS IRRIG 1000ML POUR BTL (IV SOLUTION) ×3 IMPLANT
PACK LAMINECTOMY NEURO (CUSTOM PROCEDURE TRAY) ×3 IMPLANT
RUBBERBAND STERILE (MISCELLANEOUS) ×6 IMPLANT
SPONGE SURGIFOAM ABS GEL SZ50 (HEMOSTASIS) ×3 IMPLANT
STRIP CLOSURE SKIN 1/2X4 (GAUZE/BANDAGES/DRESSINGS) ×2 IMPLANT
SUT VIC AB 0 CT1 18XCR BRD8 (SUTURE) ×1 IMPLANT
SUT VIC AB 0 CT1 8-18 (SUTURE) ×3
SUT VIC AB 2-0 CT1 18 (SUTURE) ×3 IMPLANT
SUT VICRYL 4-0 PS2 18IN ABS (SUTURE) ×3 IMPLANT
SYR 20ML ECCENTRIC (SYRINGE) ×3 IMPLANT
TAPE STRIPS DRAPE STRL (GAUZE/BANDAGES/DRESSINGS) ×2 IMPLANT
TOWEL OR 17X24 6PK STRL BLUE (TOWEL DISPOSABLE) ×3 IMPLANT
TOWEL OR 17X26 10 PK STRL BLUE (TOWEL DISPOSABLE) ×3 IMPLANT
WATER STERILE IRR 1000ML POUR (IV SOLUTION) ×3 IMPLANT

## 2015-01-09 NOTE — H&P (Signed)
Jeffrey Randolph is an 59 y.o. male.   Chief Complaint: Back and right leg pain HPI: Patient is a very pleasant 39 year gym is a progress worsening back and right leg pain rating down the anterior thigh but his knee consistent with a right L3 nerve root pattern. Workup revealed a large disc herniation L2-3 and the right due to patient's failure conservative treatment imaging findings and progression of clinical syndrome I recommended lumbar leg and microscopic discectomy at L2-3 on the right. I extensively reviewed the risks and benefits of the operation with the patient as well as perioperative course expectations of outcome and alternatives surgery and he understands and agrees to proceed forward.  Past Medical History  Diagnosis Date  . Hypertension   . CVA (cerebral infarction)   . Stroke     Left foot always feel 2 x larger, walks with a limp.  Marland Kitchen. GERD (gastroesophageal reflux disease)   . BPH (benign prostatic hyperplasia)     Past Surgical History  Procedure Laterality Date  . Tonsillectomy      History reviewed. No pertinent family history. Social History:  reports that he has quit smoking. He does not have any smokeless tobacco history on file. He reports that he does not drink alcohol or use illicit drugs.  Allergies: No Known Allergies  Medications Prior to Admission  Medication Sig Dispense Refill  . lisinopril-hydrochlorothiazide (PRINZIDE,ZESTORETIC) 20-12.5 MG per tablet Take 2 tablets by mouth daily.    Marland Kitchen. omeprazole (PRILOSEC) 40 MG capsule Take 40 mg by mouth daily.    . tamsulosin (FLOMAX) 0.4 MG CAPS capsule Take 0.4 mg by mouth at bedtime.    . gabapentin (NEURONTIN) 100 MG capsule Take 1 capsule (100 mg total) by mouth 3 (three) times daily. 90 capsule 0    No results found for this or any previous visit (from the past 48 hour(s)). No results found.  Review of Systems  Constitutional: Negative.   HENT: Negative.   Eyes: Negative.   Respiratory: Negative.    Cardiovascular: Negative.   Gastrointestinal: Negative.   Genitourinary: Negative.   Musculoskeletal: Positive for myalgias and back pain.  Skin: Negative.   Neurological: Positive for tingling and sensory change.    Blood pressure 173/73, pulse 87, temperature 97.7 F (36.5 C), temperature source Oral, resp. rate 20, height 5\' 9"  (1.753 m), weight 84.823 kg (187 lb), SpO2 98 %. Physical Exam  Constitutional: He is oriented to person, place, and time. He appears well-developed and well-nourished.  HENT:  Head: Normocephalic.  Eyes: Pupils are equal, round, and reactive to light.  Neck: Normal range of motion.  Respiratory: Effort normal.  Neurological: He is oriented to person, place, and time. He has normal strength. GCS eye subscore is 4. GCS verbal subscore is 5. GCS motor subscore is 6.  Strength is 5 out of 5 in his iliopsoas, quads, his gases, into tibialis, and EHL.     Assessment/Plan 59 year old gentleman presents for right L2-3 discectomy  Kristan Votta P 01/09/2015, 11:03 AM

## 2015-01-09 NOTE — Anesthesia Preprocedure Evaluation (Addendum)
Anesthesia Evaluation  Patient identified by MRN, date of birth, ID band Patient awake    Reviewed: Allergy & Precautions, NPO status , Patient's Chart, lab work & pertinent test results  Airway Mallampati: II  TM Distance: >3 FB Neck ROM: Full    Dental  (+) Partial Upper, Missing, Dental Advisory Given   Pulmonary neg pulmonary ROS, former smoker,  breath sounds clear to auscultation  Pulmonary exam normal       Cardiovascular hypertension, Pt. on medications Rhythm:Regular Rate:Normal     Neuro/Psych CVA, Residual Symptoms negative psych ROS   GI/Hepatic Neg liver ROS, GERD-  Medicated,  Endo/Other  negative endocrine ROS  Renal/GU negative Renal ROS  negative genitourinary   Musculoskeletal negative musculoskeletal ROS (+)   Abdominal   Peds negative pediatric ROS (+)  Hematology negative hematology ROS (+)   Anesthesia Other Findings   Reproductive/Obstetrics negative OB ROS                           Anesthesia Physical Anesthesia Plan  ASA: III  Anesthesia Plan: General   Post-op Pain Management:    Induction: Intravenous  Airway Management Planned: Oral ETT  Additional Equipment:   Intra-op Plan:   Post-operative Plan: Extubation in OR  Informed Consent: I have reviewed the patients History and Physical, chart, labs and discussed the procedure including the risks, benefits and alternatives for the proposed anesthesia with the patient or authorized representative who has indicated his/her understanding and acceptance.   Dental advisory given  Plan Discussed with: CRNA and Surgeon  Anesthesia Plan Comments:         Anesthesia Quick Evaluation

## 2015-01-09 NOTE — Op Note (Signed)
Preoperative diagnosis: Right L3 radiculopathy from herniated nucleus pulposis L2-3 right  Postoperative diagnosis: Same  Procedure: Right-sided lumbar limiting microdiscectomy L2-3 with microdissection of the right L3 nerve root microscopic discectomy  Surgeon: Jillyn HiddenGary Londyn Wotton  Asst.: Sherilyn CooterHenry pool  Anesthesia: Gen.  EBL: Minimal  History of present illness: Patient is a very pleasant 59 year old gentleman is a progress worsening back and probably right hip and anterior quad pain above his knee consistent with a right L3 nerve root pattern workup revealed disc herniation L2-3 causing severe compression of the right L3 nerve root. Due to patient's failure conservative treatment imaging findings and progression of clinical syndrome I recommended limiting microscopic discectomy at L2-3 on the right. I extensively went over the risks and benefits of the operation with the patient as well as perioperative course expectations of outcome alternatives surgery and he understood and agreed to proceed forward.  Operative procedure: Patient brought into the or was induced under general anesthesia positioned prone the Wilson frame his back was prepped and draped in routine sterile fashion preoperative x-ray localize the appropriate level so after infiltration of 10 mL lidocaine with epi a midline incision was made and Bovie light cautery was used to gas subcutaneous tissues and subperiosteal dissections care lamina of L2-3 on the right. Interoperative X identify the L3-4 disc space level so attention was taken one interspace above this laminotomy as well as a high-speed drill to drill off the inferior aspect lamina of L2 medial facet complex super aspect limited of L3. And abutting both lamina with 3 mm Kerrison punch and under biting the facet complex allowed indication of thecal sac. Under my scope illumination the L3 pedicle was identified marching superiorly identify the L2-3 disc space. Correlate the epidural veins  are still disc herniation partially contained with a ligament ligament was incised with lumbar scalpel and disc spaces cleanout pituitary rongeurs and Epstein curettes. At the end of the Discectomy there is no further stenosis the L3 foramen was widely patent thecal sac and L3 nerve root were decompressed. The wounds and to see her get meticulous in space was maintained Gelfoam was opened up the dura the muscle fascia approximately layers with after Vicryl and skin is closed running 4 subcuticular benzoin and Steri-Strips were applied patient recovered in stable condition. At the end the case all needle counts sponge counts were correct.

## 2015-01-09 NOTE — Transfer of Care (Signed)
Immediate Anesthesia Transfer of Care Note  Patient: Jeffrey Randolph  Procedure(s) Performed: Procedure(s) with comments: LUMBAR LAMINECTOMY/DECOMPRESSION MICRODISCECTOMY 1 LEVEL (Right) - LUMBAR LAMINECTOMY/DECOMPRESSION MICRODISCECTOMY 1 LEVEL RIGHT LUMBAR 2-3  Patient Location: PACU  Anesthesia Type:General  Level of Consciousness: awake and alert   Airway & Oxygen Therapy: Patient Spontanous Breathing and Patient connected to nasal cannula oxygen  Post-op Assessment: Report given to RN, Post -op Vital signs reviewed and stable and Patient moving all extremities  Post vital signs: Reviewed and stable  Last Vitals:  Filed Vitals:   01/09/15 0934  BP: 173/73  Pulse: 87  Temp: 36.5 C  Resp: 20    Complications: No apparent anesthesia complications

## 2015-01-09 NOTE — Anesthesia Procedure Notes (Signed)
Procedure Name: Intubation Date/Time: 01/09/2015 11:34 AM Performed by: Quentin OreWALKER, Evella Kasal E Pre-anesthesia Checklist: Patient identified, Emergency Drugs available, Suction available, Patient being monitored and Timeout performed Patient Re-evaluated:Patient Re-evaluated prior to inductionOxygen Delivery Method: Circle system utilized Preoxygenation: Pre-oxygenation with 100% oxygen Intubation Type: IV induction Ventilation: Mask ventilation without difficulty and Oral airway inserted - appropriate to patient size Laryngoscope Size: Mac and 3 Grade View: Grade II Tube type: Oral Tube size: 7.5 mm Number of attempts: 1 Airway Equipment and Method: Stylet Placement Confirmation: ETT inserted through vocal cords under direct vision,  positive ETCO2 and breath sounds checked- equal and bilateral Secured at: 22 cm Tube secured with: Tape Dental Injury: Teeth and Oropharynx as per pre-operative assessment

## 2015-01-09 NOTE — Anesthesia Postprocedure Evaluation (Signed)
  Anesthesia Post-op Note  Patient: Jeffrey Randolph  Procedure(s) Performed: Procedure(s) (LRB): LUMBAR LAMINECTOMY/DECOMPRESSION MICRODISCECTOMY 1 LEVEL (Right)  Patient Location: PACU  Anesthesia Type: General  Level of Consciousness: awake and alert   Airway and Oxygen Therapy: Patient Spontanous Breathing  Post-op Pain: mild  Post-op Assessment: Post-op Vital signs reviewed, Patient's Cardiovascular Status Stable, Respiratory Function Stable, Patent Airway and No signs of Nausea or vomiting  Last Vitals:  Filed Vitals:   01/09/15 1549  BP: 129/70  Pulse: 106  Temp:   Resp: 16    Post-op Vital Signs: stable   Complications: No apparent anesthesia complications

## 2015-01-10 ENCOUNTER — Encounter (HOSPITAL_COMMUNITY): Payer: Self-pay | Admitting: Neurosurgery

## 2015-01-10 DIAGNOSIS — M5116 Intervertebral disc disorders with radiculopathy, lumbar region: Secondary | ICD-10-CM | POA: Diagnosis not present

## 2015-01-10 MED ORDER — OXYCODONE-ACETAMINOPHEN 5-325 MG PO TABS: 1.0000 | ORAL_TABLET | ORAL | Status: DC | PRN

## 2015-01-10 NOTE — Discharge Summary (Signed)
  Physician Discharge Summary  Patient ID: Jeffrey LyeRandy Randolph MRN: [id number] DOB/AGE: 09-Dec-1955 59 y.o.  Admit date: 01/09/2015 Discharge date: 01/10/2015  Admission Diagnoses: Herniated nucleus pulposus L2-3 right  Discharge Diagnoses: Same Active Problems:   HNP (herniated nucleus pulposus), lumbar   Discharged Condition: good  Hospital Course: Patient admitted the hospital underwent laminectomy microdiscectomy postop patient did very well recovered in the floor on the floor was angling and voiding spontaneously tolerating regular diet was stable for discharge home.  Consults: Significant Diagnostic Studies: Treatments: Microdiscectomy L2-3 right Discharge Exam: Blood pressure 126/67, pulse 82, temperature 98 F (36.7 C), temperature source Oral, resp. rate 18, height 5\' 9"  (1.753 m), weight 84.823 kg (187 lb), SpO2 96 %. Strength 5 out of 5 wound clean dry and intact  Disposition: Home     Medication List    TAKE these medications        gabapentin 100 MG capsule  Commonly known as:  NEURONTIN  Take 1 capsule (100 mg total) by mouth 3 (three) times daily.     lisinopril-hydrochlorothiazide 20-12.5 MG per tablet  Commonly known as:  PRINZIDE,ZESTORETIC  Take 2 tablets by mouth daily.     omeprazole 40 MG capsule  Commonly known as:  PRILOSEC  Take 40 mg by mouth daily.     oxyCODONE-acetaminophen 5-325 MG per tablet  Commonly known as:  PERCOCET/ROXICET  Take 1-2 tablets by mouth every 4 (four) hours as needed for moderate pain.     tamsulosin 0.4 MG Caps capsule  Commonly known as:  FLOMAX  Take 0.4 mg by mouth at bedtime.         Signed: Damyia Strider P 01/10/2015, 7:29 AM

## 2015-01-10 NOTE — Progress Notes (Signed)
Pt doing well. Pt given D/C instructions with Rx, verbal understanding was provided. Pt's incision is covered with Honeycomb dressing and it has no sign of infection. Pt's IV was removed prior to D/C. Pt D/C'd home via wheelchair @ 1040 per MD order. Pt is stable @ D/C and has no other needs at this time. Rema FendtAshley Prescott Truex, RN

## 2015-01-10 NOTE — Progress Notes (Signed)
Patient ID: Jeffrey Randolph, male   DOB: 1956-04-26, 59 y.o.   MRN: [id number] Patient doing well no leg pain  Strength 5 out of 5 wound clean dry and intact  Discharge home

## 2015-01-10 NOTE — Discharge Instructions (Signed)
No lifting no bending no twisting no driving a riding a car unless he's come back and forth to see me.   Wound Care Keep incision covered and dry for one week.  If you shower prior to then, cover incision with plastic wrap.  You may remove outer bandage after one week and shower.  Do not put any creams, lotions, or ointments on incision. Leave steri-strips on neck.  They will fall off by themselves. Activity Walk each and every day, increasing distance each day. No lifting greater than 5 lbs.  Avoid bending, arching, or twisting. No driving for 2 weeks; may ride as a passenger locally. If provided with back brace, wear when out of bed.  It is not necessary to wear in bed. Diet Resume your normal diet.  Return to Work Will be discussed at you follow up appointment. Call Your Doctor If Any of These Occur Redness, drainage, or swelling at the wound.  Temperature greater than 101 degrees. Severe pain not relieved by pain medication. Incision starts to come apart. Follow Up Appt Call today for appointment in 1-2 weeks ([phone]) or for problems.  If you have any hardware placed in your spine, you will need an x-ray before your appointment.   Laminectomy - Laminotomy - Discectomy Your surgeon has decided that a laminectomy (entire lamina removal) or laminotomy (partial lamina removal) is the best treatment for your back problem. These procedures involve removal of bone to relieve pressure on nerve roots. It allows the surgeon access to parts of the spine where other problems are located. This could be an injured disc (the cartilage-like structures located between the bones of the back). In this surgery your surgeon removes a part of the boney arch that surrounds your spinal canal. This may be compressing nerve roots. In some cases, the surgeon will remove the disc and fuse (stick together) vertebral bodies (the bones of your back) to make the spine more stable. The type of procedure you will  need is usually decided prior to surgery, however modifications may be necessary. The time in surgery depends on the findings in surgery and the procedure necessary to correct the problems. DISCECTOMY For people with disc problems, the surgeon removes the portion of the disc that is causing the pressure on the nerve root. Some surgeons perform a micro (small) discectomy, which may require removal of only a small portion of the lamina. A disc nucleus (center) may also be removed either through a needle (percutaneous discectomy) or by injecting an enzyme called chymopapain into the disc. Chymopapain is an enzyme that dissolves the disc. For people with back instability, the surgeon fuses vertebrae that are next to each other with tiny pieces of bone. These are used as bone grafts on the facets, or between the vertebrae. When this heals, the bones will no longer be able to move. These bone chips are often taken from the pelvic bones. Bones and bone grafts grow into one unit, stabilizing the segments of the spinal column. LET YOUR CAREGIVER KNOW ABOUT:  Allergies.  Medicines taken including herbs, eyedrops, over-the-counter medicines, and creams.  Use of steroids (by mouth or creams).  Previous problems with anesthetics or numbing medicine.  Possibility of pregnancy, if this applies.  History of blood clots (thrombophlebitis).  History of bleeding or blood problems.  Previous surgery.  Other health problems. RISKS AND COMPLICATIONS Your caregiver will discuss possible risks and complications with you before surgery. In addition to the usual risks of anesthesia, other common  risks and complications include:  Blood loss and replacement.  Temporary increase in pain due to surgery.  Uncorrected back pain.  Infection.  New nerve damage (tingling, numbness, and pain). BEFORE THE PROCEDURE  Stop smoking at least 1 week prior to surgery. This lowers risk during surgery.  Your caregiver may  advise that you stop taking certain medicines that may affect the outcome of the surgery and your ability to heal. For example, you may need to stop taking anti-inflammatories, such as aspirin, because of possible bleeding problems. Other medicines may have interactions with anesthesia.  Tell your caregiver if you have been on steroids for long periods of time. Often, additional steroids are administered intravenously before and during the procedure to prevent complications.  You should be present 60 minutes prior to your procedure or as directed. AFTER THE PROCEDURE After surgery, you will be taken to the recovery area where a nurse will watch and check your progress. Generally, you will be allowed to go home within 1 week barring other problems. HOME CARE INSTRUCTIONS   Check the surgical cut (incision) twice a day for signs of infection. Some signs may include a bad smelling, greenish or yellowish discharge from the wound; increased pain or increased redness over the incision site; an opening of the incision; flu-like symptoms; or a temperature above 101.5 F (38.6 C).  Change your bandages in about 24 to 36 hours following surgery or as directed.  You may shower once the bandage is removed or as directed. Avoid bathtubs, swimming pools, and hot tubs for 3 weeks or until your incision has healed completely. If you have stitches (sutures) or staples they may be removed 2 to 3 weeks after surgery, or as directed by your caregiver.  Follow your caregiver's instructions for activities, exercises, and physical therapy.  Weight reduction may be beneficial if you are overweight.  Walking is permitted. You may use a treadmill without an incline. Cut down on activities if you have discomfort. You may also go up and down stairs as tolerated.  Do not lift anything heavier than 10 to 15 pounds. Avoid bending or twisting at the waist. Always bend your knees.  Maintain strength and range of motion as  instructed.  No driving is permitted for 2 to 3 weeks, or as directed by your caregiver. You may be a passenger for 20 to 30 minute trips. Lying back in the passenger seat may be more comfortable for you.  Limit your sitting to 20 to 30 minute intervals. You should lie down or walk in between sitting periods. There are no limitations for sitting in a recliner chair.  Only take over-the-counter or prescription medicines for pain, discomfort, or fever as directed by your caregiver. SEEK MEDICAL CARE IF:   There is increased bleeding (more than a small spot) from the wound.  You notice redness, swelling, or increasing pain in the wound.  Pus is coming from the wound.  An unexplained oral temperature above 102 F (38.9 C) develops.  You notice a bad smell coming from the wound or dressing. SEEK IMMEDIATE MEDICAL CARE IF:   You develop a rash.  You have difficulty breathing.  You have any allergic problems. Document Released: 09/20/2000 Document Revised: 02/07/2014 Document Reviewed: 07/19/2008 [facility] Patient Information 2015 [place], [place]. This information is not intended to replace advice given to you by your health care provider. Make sure you discuss any questions you have with your health care provider.

## 2015-04-20 DIAGNOSIS — M48061 Spinal stenosis, lumbar region without neurogenic claudication: Secondary | ICD-10-CM | POA: Insufficient documentation

## 2015-08-14 DIAGNOSIS — M47816 Spondylosis without myelopathy or radiculopathy, lumbar region: Secondary | ICD-10-CM | POA: Insufficient documentation

## 2018-02-14 ENCOUNTER — Encounter: Payer: Self-pay | Admitting: Emergency Medicine

## 2018-02-14 ENCOUNTER — Emergency Department
Admission: EM | Admit: 2018-02-14 | Discharge: 2018-02-14 | Disposition: A | Discharge status: Home / Self Care | Payer: Medicaid Other | Attending: Emergency Medicine | Admitting: Emergency Medicine

## 2018-02-14 DIAGNOSIS — I10 Essential (primary) hypertension: Secondary | ICD-10-CM | POA: Diagnosis not present

## 2018-02-14 DIAGNOSIS — Z87891 Personal history of nicotine dependence: Secondary | ICD-10-CM | POA: Diagnosis not present

## 2018-02-14 DIAGNOSIS — Z79899 Other long term (current) drug therapy: Secondary | ICD-10-CM | POA: Diagnosis not present

## 2018-02-14 DIAGNOSIS — Z8673 Personal history of transient ischemic attack (TIA), and cerebral infarction without residual deficits: Secondary | ICD-10-CM | POA: Insufficient documentation

## 2018-02-14 DIAGNOSIS — R2232 Localized swelling, mass and lump, left upper limb: Secondary | ICD-10-CM | POA: Diagnosis present

## 2018-02-14 DIAGNOSIS — M7592 Shoulder lesion, unspecified, left shoulder: Secondary | ICD-10-CM | POA: Diagnosis not present

## 2018-02-14 NOTE — ED Provider Notes (Signed)
[facility] Emergency Department Provider Note   ____________________________________________   First MD Initiated Contact with Patient 02/14/18 [phone]     (approximate)  I have reviewed the triage vital signs and the nursing notes.   HISTORY  Chief Complaint Abscess    HPI Jeffrey Randolph is a 62 y.o. male patient complain of increased growth of a nodular lesion on his left superior shoulder.  Patient denies pain with this complaint.  Patient states lesion irritated secondary to wearing portable air blower in which the strap rubs against the lesion.  Patient states they are worse mild bleeding yesterday while using the portable blower.  Patient has a history of working outdoors.  Past Medical History:  Diagnosis Date  . BPH (benign prostatic hyperplasia)   . CVA (cerebral infarction)   . GERD (gastroesophageal reflux disease)   . Hypertension   . Stroke [facility])    Left foot always feel 2 x larger, walks with a limp.    Patient Active Problem List   Diagnosis Date Noted  . HNP (herniated nucleus pulposus), lumbar 01/09/2015    Past Surgical History:  Procedure Laterality Date  . LUMBAR LAMINECTOMY/DECOMPRESSION MICRODISCECTOMY Right 01/09/2015   Procedure: LUMBAR LAMINECTOMY/DECOMPRESSION MICRODISCECTOMY 1 LEVEL;  Surgeon: Donalee Citrin, MD;  Location: [facility] NEUR[facility] ORS;  Service: Neurosurgery;  Laterality: Right;  LUMBAR LAMINECTOMY/DECOMPRESSION MICRODISCECTOMY 1 LEVEL RIGHT LUMBAR 2-3  . TONSILLECTOMY      Prior to Admission medications   Medication Sig Start Date End Date Taking? Authorizing Provider  gabapentin (NEURONTIN) 100 MG capsule Take 1 capsule (100 mg total) by mouth 3 (three) times daily. 03/05/12 03/05/13  Phillis Haggis, MD  lisinopril-hydrochlorothiazide (PRINZIDE,ZESTORETIC) 20-12.5 MG per tablet Take 2 tablets by mouth daily.    [provider]  omeprazole (PRILOSEC) 40 MG capsule Take 40 mg by mouth daily.    [provider]    oxyCODONE-acetaminophen (PERCOCET/ROXICET) 5-325 MG per tablet Take 1-2 tablets by mouth every 4 (four) hours as needed for moderate pain. 01/10/15   Donalee Citrin, MD  tamsulosin (FLOMAX) 0.4 MG CAPS capsule Take 0.4 mg by mouth at bedtime.    [provider]    Allergies Patient has no known allergies.  No family history on file.  Social History Social History   Tobacco Use  . Smoking status: Former Smoker    Years: 3.00  . Tobacco comment: quit smoking in 1980's  Substance Use Topics  . Alcohol use: No  . Drug use: No    Review of Systems Constitutional: No fever/chills Eyes: No visual changes. ENT: No sore throat. Cardiovascular: Denies chest pain. Respiratory: Denies shortness of breath. Gastrointestinal: No abdominal pain.  No nausea, no vomiting.  No diarrhea.  No constipation. Genitourinary: Negative for dysuria. Musculoskeletal: Negative for back pain. Skin: Nodule lesion superior aspect of left shoulder Neurological: Negative for headaches, focal weakness or numbness. Endocrine:Hypertension   ____________________________________________   PHYSICAL EXAM:  VITAL SIGNS: ED Triage Vitals  Enc Vitals Group     BP 02/14/18 0705 (!) 143/68     Pulse Rate 02/14/18 0705 90     Resp --      Temp 02/14/18 0705 98.2 F (36.8 C)     Temp Source 02/14/18 0705 Oral     SpO2 02/14/18 0705 97 %     Weight 02/14/18 0706 185 lb (83.9 kg)     Height 02/14/18 0706  (1.753 m)     Head Circumference --  Peak Flow --      Pain Score 02/14/18 0706 0     Pain Loc --      Pain Edu? --      Excl. in GC? --    Constitutional: Alert and oriented. Well appearing and in no acute distress. Neck: No stridor.  Hematological/Lymphatic/Immunilogical: No cervical lymphadenopathy. Cardiovascular: Normal rate, regular rhythm. Grossly normal heart sounds.  Good peripheral circulation. Respiratory: Normal respiratory effort.  No retractions. Lungs CTAB. Neurologic:   Normal speech and language. No gross focal neurologic deficits are appreciated. No gait instability. Skin: 3 cm nodule lesion with irregular borders superior aspect of left shoulder.  Psychiatric: Mood and affect are normal. Speech and behavior are normal.  ____________________________________________   LABS (all labs ordered are listed, but only abnormal results are displayed)  Labs Reviewed - No data to display ____________________________________________  EKG   ____________________________________________  RADIOLOGY  ED MD interpretation:    Official radiology report(s): No results found.  ____________________________________________   PROCEDURES  Procedure(s) performed: None  Procedures  Critical Care performed: No  ____________________________________________   INITIAL IMPRESSION / ASSESSMENT AND PLAN / ED COURSE  As part of my medical decision making, I reviewed the following data within the electronic MEDICAL RECORD NUMBER    Patient presents with 3 cm nodule lesion left shoulder.  Differential consist of abscess, sebaceous infected cyst, or basal cell carcinoma.  Patient will be referred to dermatology for definitive evaluation and treatment.      ____________________________________________   FINAL CLINICAL IMPRESSION(S) / ED DIAGNOSES  Final diagnoses:  Shoulder lesion, unspecified, left shoulder     ED Discharge Orders    None       Note:  This document was prepared using Dragon voice recognition software and may include unintentional dictation errors.    Joni Reining, PA-C 02/14/18 0731    Nita Sickle, MD 02/15/18 [phone]

## 2018-02-14 NOTE — Discharge Instructions (Addendum)
Keep area covered with dressing until evaluation by dermatology.  Call Monday morning at 830 and advised them you are a follow-up from the emergency room.

## 2018-02-14 NOTE — ED Triage Notes (Signed)
Pt reports has a knot pop up on his left shoulder and it burst open and was bleeding. Pt with gauze over top of it now. Bleeding controlled.

## 2018-12-22 ENCOUNTER — Other Ambulatory Visit: Payer: Self-pay

## 2018-12-22 ENCOUNTER — Emergency Department (HOSPITAL_COMMUNITY)
Admission: EM | Admit: 2018-12-22 | Discharge: 2018-12-22 | Disposition: A | Discharge status: Home / Self Care | Payer: Medicaid Other | Attending: Emergency Medicine | Admitting: Emergency Medicine

## 2018-12-22 ENCOUNTER — Encounter (HOSPITAL_COMMUNITY): Payer: Self-pay | Admitting: Emergency Medicine

## 2018-12-22 DIAGNOSIS — Z8673 Personal history of transient ischemic attack (TIA), and cerebral infarction without residual deficits: Secondary | ICD-10-CM | POA: Diagnosis not present

## 2018-12-22 DIAGNOSIS — I10 Essential (primary) hypertension: Secondary | ICD-10-CM | POA: Diagnosis not present

## 2018-12-22 DIAGNOSIS — R5383 Other fatigue: Secondary | ICD-10-CM | POA: Diagnosis present

## 2018-12-22 DIAGNOSIS — I4891 Unspecified atrial fibrillation: Secondary | ICD-10-CM | POA: Insufficient documentation

## 2018-12-22 DIAGNOSIS — Z87891 Personal history of nicotine dependence: Secondary | ICD-10-CM | POA: Insufficient documentation

## 2018-12-22 LAB — CBC
HCT: 41 % (ref 39.0–52.0)
HEMOGLOBIN: 14.3 g/dL (ref 13.0–17.0)
MCH: 36.3 pg — ABNORMAL HIGH (ref 26.0–34.0)
MCHC: 34.9 g/dL (ref 30.0–36.0)
MCV: 104.1 fL — ABNORMAL HIGH (ref 80.0–100.0)
NRBC: 0.2 % (ref 0.0–0.2)
Platelets: 192 10*3/uL (ref 150–400)
RBC: 3.94 MIL/uL — AB (ref 4.22–5.81)
RDW: 14.5 % (ref 11.5–15.5)
WBC: 8.2 10*3/uL (ref 4.0–10.5)

## 2018-12-22 LAB — BASIC METABOLIC PANEL
Anion gap: 11 (ref 5–15)
BUN: <5 mg/dL — ABNORMAL LOW (ref 8–23)
CO2: 23 mmol/L (ref 22–32)
CREATININE: 0.74 mg/dL (ref 0.61–1.24)
Calcium: 9.4 mg/dL (ref 8.9–10.3)
Chloride: 100 mmol/L (ref 98–111)
GFR calc non Af Amer: >60 mL/min (ref >60)
Glucose, Bld: 205 mg/dL — ABNORMAL HIGH (ref 70–99)
Potassium: 3.3 mmol/L — ABNORMAL LOW (ref 3.5–5.1)
SODIUM: 134 mmol/L — AB (ref 135–145)

## 2018-12-22 MED ORDER — METOPROLOL TARTRATE 5 MG/5ML IV SOLN
5.0000 mg | Freq: Once | INTRAVENOUS | Status: AC
  Administered 2018-12-22: 5 mg via INTRAVENOUS
  Filled 2018-12-22: qty 5

## 2018-12-22 MED ORDER — APIXABAN 5 MG PO TABS
5.0000 mg | ORAL_TABLET | Freq: Once | ORAL | Status: AC
  Administered 2018-12-22: 5 mg via ORAL
  Filled 2018-12-22: qty 1

## 2018-12-22 MED ORDER — APIXABAN 5 MG PO TABS: 5.0000 mg | ORAL_TABLET | Freq: Two times a day (BID) | ORAL | 0 refills | Status: DC

## 2018-12-22 MED ORDER — APIXABAN 5 MG PO TABS: 10.0000 mg | ORAL_TABLET | Freq: Once | ORAL | Status: DC

## 2018-12-22 MED ORDER — ETOMIDATE 2 MG/ML IV SOLN
INTRAVENOUS | Status: AC | PRN
  Administered 2018-12-22 (×2): 5 mg via INTRAVENOUS

## 2018-12-22 MED ORDER — ETOMIDATE 2 MG/ML IV SOLN
10.0000 mg | Freq: Once | INTRAVENOUS | Status: DC
  Filled 2018-12-22: qty 10

## 2018-12-22 NOTE — Progress Notes (Signed)
RT NOTE:  RT standby in room during cardioversion.

## 2018-12-22 NOTE — Sedation Documentation (Signed)
120J shock given to patient  

## 2018-12-22 NOTE — ED Provider Notes (Signed)
[facility] Emergency Department Provider Note MRN:  [id number]  Arrival date & time: 12/23/18     Chief Complaint   Atrial Fibrillation   History of Present Illness   Jeffrey Randolph is a 63 y.o. year-old male with a history of stroke presenting to the ED with chief complaint of A. fib.  Patient felt well yesterday, began feeling general fatigue/malaise last night before bed.  Woke up feeling very lightheaded, worsening weakness and malaise.  Denies chest pain or shortness of breath, no abdominal pain, no fever, no numbness weakness to the arms or legs.  Denies history of A. fib.  Symptoms are constant, moderate, no exacerbating relieving factors.  Denies palpitations.  Review of Systems  A complete 10 system review of systems was obtained and all systems are negative except as noted in the HPI and PMH.   Patient's Health History    Past Medical History:  Diagnosis Date  . BPH (benign prostatic hyperplasia)   . CVA (cerebral infarction)   . GERD (gastroesophageal reflux disease)   . Hypertension   . Stroke [facility])    Left foot always feel 2 x larger, walks with a limp.    Past Surgical History:  Procedure Laterality Date  . LUMBAR LAMINECTOMY/DECOMPRESSION MICRODISCECTOMY Right 01/09/2015   Procedure: LUMBAR LAMINECTOMY/DECOMPRESSION MICRODISCECTOMY 1 LEVEL;  Surgeon: Donalee Citrin, MD;  Location: [facility] NEUR[facility] ORS;  Service: Neurosurgery;  Laterality: Right;  LUMBAR LAMINECTOMY/DECOMPRESSION MICRODISCECTOMY 1 LEVEL RIGHT LUMBAR 2-3  . TONSILLECTOMY      History reviewed. No pertinent family history.  Social History   Socioeconomic History  . Marital status: Single    Spouse name: Not on file  . Number of children: Not on file  . Years of education: Not on file  . Highest education level: Not on file  Occupational History  . Not on file  Social Needs  . Financial resource strain: Not on file  . Food insecurity:    Worry: Not on file    Inability: Not on file  .  Transportation needs:    Medical: Not on file    Non-medical: Not on file  Tobacco Use  . Smoking status: Former Smoker    Years: 3.00  . Smokeless tobacco: Never Used  . Tobacco comment: quit smoking in 1980's  Substance and Sexual Activity  . Alcohol use: No  . Drug use: No  . Sexual activity: Not on file  Lifestyle  . Physical activity:    Days per week: Not on file    Minutes per session: Not on file  . Stress: Not on file  Relationships  . Social connections:    Talks on phone: Not on file    Gets together: Not on file    Attends religious service: Not on file    Active member of club or organization: Not on file    Attends meetings of clubs or organizations: Not on file    Relationship status: Not on file  . Intimate partner violence:    Fear of current or ex partner: Not on file    Emotionally abused: Not on file    Physically abused: Not on file    Forced sexual activity: Not on file  Other Topics Concern  . Not on file  Social History Narrative  . Not on file     Physical Exam  Vital Signs and Nursing Notes reviewed Vitals:   12/22/18 1515 12/22/18 1603  BP: 130/75 (!) 142/82  Pulse:  82  Resp: 17 19  Temp:    SpO2:  98%    CONSTITUTIONAL: Well-appearing, NAD NEURO:  Alert and oriented x 3, no focal deficits EYES:  eyes equal and reactive ENT/NECK:  no LAD, no JVD CARDIO: Tachycardic and irregular rate, well-perfused, normal S1 and S2 PULM:  CTAB no wheezing or rhonchi GI/GU:  normal bowel sounds, non-distended, non-tender MSK/SPINE:  No gross deformities, no edema SKIN:  no rash, atraumatic PSYCH:  Appropriate speech and behavior  Diagnostic and Interventional Summary    EKG Interpretation  Date/Time:  Tuesday December 22 2018 12:45:21 EDT Ventricular Rate:  152 PR Interval:    QRS Duration: 84 QT Interval:  296 QTC Calculation: 471 R Axis:   53 Text Interpretation:  Atrial fibrillation ST depression, probably rate related Baseline wander in  lead(s) V1 Confirmed by Kennis Carina [phone]) on 12/22/2018 1:18:19 PM      Labs Reviewed  CBC - Abnormal; Notable for the following components:      Result Value   RBC 3.94 (*)    MCV 104.1 (*)    MCH 36.3 (*)    All other components within normal limits  BASIC METABOLIC PANEL - Abnormal; Notable for the following components:   Sodium 134 (*)    Potassium 3.3 (*)    Glucose, Bld 205 (*)    BUN <5 (*)    All other components within normal limits    No orders to display    Medications  metoprolol tartrate (LOPRESSOR) injection 5 mg (5 mg Intravenous Given 12/22/18 1334)  etomidate (AMIDATE) injection (5 mg Intravenous Given 12/22/18 1350)  apixaban (ELIQUIS) tablet 5 mg (5 mg Oral Given 12/22/18 1558)     .Sedation Date/Time: 12/23/2018 3:06 PM Performed by: Sabas Sous, MD Authorized by: Sabas Sous, MD   Consent:    Consent obtained:  Verbal and written   Consent given by:  Patient   Risks discussed:  Prolonged hypoxia resulting in organ damage, prolonged sedation necessitating reversal and respiratory compromise necessitating ventilatory assistance and intubation Universal protocol:    Immediately prior to procedure a time out was called: yes     Patient identity confirmation method:  Anonymous protocol, patient vented/unresponsive, arm band, hospital-assigned identification number and verbally with patient Indications:    Procedure performed:  Cardioversion   Procedure necessitating sedation performed by:  Physician performing sedation Pre-sedation assessment:    Time since last food or drink:  3 hours   ASA classification: class 1 - normal, healthy patient     Mallampati score:  I - soft palate, uvula, fauces, pillars visible   Pre-sedation assessments completed and reviewed: airway patency, cardiovascular function, mental status and respiratory function   Immediate pre-procedure details:    Reviewed: vital signs and relevant labs/tests     Verified: bag valve  mask available, emergency equipment available, intubation equipment available, IV patency confirmed, oxygen available and suction available   Procedure details (see MAR for exact dosages):    Preoxygenation:  Nasal cannula   Sedation:  Etomidate   Intra-procedure monitoring:  Blood pressure monitoring, cardiac monitor, continuous capnometry and continuous pulse oximetry   Intra-procedure events: none     Total Provider sedation time (minutes):  16 Post-procedure details:    Attendance: Constant attendance by certified staff until patient recovered     Recovery: Patient returned to pre-procedure baseline     Post-sedation assessments completed and reviewed: airway patency, cardiovascular function, mental status and respiratory function     Patient  is stable for discharge or admission: yes     Patient tolerance:  Tolerated well, no immediate complications .Cardioversion Date/Time: 12/23/2018 3:07 PM Performed by: Sabas Sous, MD Authorized by: Sabas Sous, MD   Consent:    Consent obtained:  Verbal and written   Consent given by:  Patient   Alternatives discussed:  Rate-control medication Pre-procedure details:    Cardioversion basis:  Elective   Rhythm:  Atrial fibrillation   Electrode placement:  Anterior-posterior Patient sedated: Yes. Refer to sedation procedure documentation for details of sedation.  Attempt one:    Cardioversion mode:  Synchronous   Shock (Joules):  120   Shock outcome:  Conversion to normal sinus rhythm Post-procedure details:    Patient status:  Awake   Patient tolerance of procedure:  Tolerated well, no immediate complications Comments:     Successful conversion from Afib to NSR     Critical Care Critical Care Documentation Critical care time provided by me (excluding procedures): 32 minutes  Condition necessitating critical care: Afib with RVR  Components of critical care management: reviewing of prior records, laboratory and imaging  interpretation, frequent re-examination and reassessment of vital signs, administration of IV metoprolol    ED Course and Medical Decision Making  I have reviewed the triage vital signs and the nursing notes.  Pertinent labs & imaging results that were available during my care of the patient were reviewed by me and considered in my medical decision making (see below for details).  New onset A. fib in this 63 year old male, clear acute symptom onset within the past 24 hours, good candidate for cardioversion.  Rates in the 140s, normotensive, no chest pain, will set up for procedure.  Procedure tolerated well, will give first dose of anticoagulation and refer to A. fib clinic.  Counseling provided by pharmacy.  After the discussed management above, the patient was determined to be safe for discharge.  The patient was in agreement with this plan and all questions regarding their care were answered.  ED return precautions were discussed and the patient will return to the ED with any significant worsening of condition.  Elmer Sow. Pilar Plate, MD [facility] Emergency Medicine [facility] [email or web address]  Final Clinical Impressions(s) / ED Diagnoses     ICD-10-CM   1. Atrial fibrillation with RVR [facility]) I48.91     ED Discharge Orders         Ordered    Amb referral to AFIB Clinic     12/22/18 1515    apixaban (ELIQUIS) 5 MG TABS tablet  2 times daily     12/22/18 1544             Sabas Sous, MD 12/23/18 [phone]

## 2018-12-22 NOTE — ED Notes (Signed)
Pharmacy at bedside to talk to pt about starting on eliquis.

## 2018-12-22 NOTE — ED Notes (Signed)
Patient verbalizes understanding of discharge instructions. Opportunity for questioning and answers were provided. Armband removed by staff, pt discharged from ED.  

## 2018-12-22 NOTE — ED Triage Notes (Signed)
Pt arrives to ED from home with complaints of dizziness and feelings of passing out starting this morning when he woke up. EMS reports pt was found to be in atrial fibrillation with a HR in the 130's to 180's.

## 2018-12-22 NOTE — Discharge Instructions (Addendum)
You were evaluated in the Emergency Department and after careful evaluation, we did not find any emergent condition requiring admission or further testing in the hospital.  Your symptoms today seem to be due to atrial fibrillation.  We were able to convert your heart rhythm back to normal after a cardioversion procedure today.  Please follow-up with the A. fib clinic, please call today or tomorrow to set up an appointment.  We are starting you on a blood thinner to be taken twice a day.  The cardiologist may decide to keep you on this medicine or stop the medicine.  Please return to the Emergency Department if you experience any worsening of your condition.  We encourage you to follow up with a primary care provider.  Thank you for allowing [place] to be a part of your care.  Information on my medicine - ELIQUIS (apixaban)  This medication education was reviewed with me or my healthcare representative as part of my discharge preparation.    Why was Eliquis prescribed for you? Eliquis was prescribed for you to reduce the risk of forming blood clots that can cause a stroke if you have a medical condition called atrial fibrillation (a type of irregular heartbeat) OR to reduce the risk of a blood clots forming after orthopedic surgery.  What do You need to know about Eliquis ? Take your Eliquis TWICE DAILY - one tablet in the morning and one tablet in the evening with or without food.  It would be best to take the doses about the same time each day.  If you have difficulty swallowing the tablet whole please discuss with your pharmacist how to take the medication safely.  Take Eliquis exactly as prescribed by your doctor and DO NOT stop taking Eliquis without talking to the doctor who prescribed the medication.  Stopping may increase your risk of developing a new clot or stroke.  Refill your prescription before you run out.  After discharge, you should have regular check-up appointments with your  healthcare provider that is prescribing your Eliquis.  In the future your dose may need to be changed if your kidney function or weight changes by a significant amount or as you get older.  What do you do if you miss a dose? If you miss a dose, take it as soon as you remember on the same day and resume taking twice daily.  Do not take more than one dose of ELIQUIS at the same time.  Important Safety Information A possible side effect of Eliquis is bleeding. You should call your healthcare provider right away if you experience any of the following: ? Bleeding from an injury or your nose that does not stop. ? Unusual colored urine (red or dark brown) or unusual colored stools (red or black). ? Unusual bruising for unknown reasons. ? A serious fall or if you hit your head (even if there is no bleeding).  Some medicines may interact with Eliquis and might increase your risk of bleeding or clotting while on Eliquis. To help avoid this, consult your healthcare provider or pharmacist prior to using any new prescription or non-prescription medications, including herbals, vitamins, non-steroidal anti-inflammatory drugs (NSAIDs) and supplements.  This website has more information on Eliquis (apixaban): [email or web address].com.cy.

## 2019-01-04 ENCOUNTER — Telehealth (HOSPITAL_COMMUNITY): Payer: Self-pay | Admitting: *Deleted

## 2019-01-04 NOTE — Telephone Encounter (Signed)
Attempted to reach patient x 2.  First call hung up without answer.  Second call was picked up and hung up.  Pt was referred to afib clinic.  Pt lives in [place] and it is unclear as to whether or not he has another cardiologist.   Pt was dccv in ED on March 17th

## 2019-04-05 ENCOUNTER — Emergency Department (HOSPITAL_COMMUNITY)
Admission: EM | Admit: 2019-04-05 | Discharge: 2019-04-05 | Disposition: A | Discharge status: Home / Self Care | Payer: Medicaid Other | Attending: Emergency Medicine | Admitting: Emergency Medicine

## 2019-04-05 ENCOUNTER — Emergency Department (HOSPITAL_COMMUNITY): Payer: Medicaid Other

## 2019-04-05 ENCOUNTER — Other Ambulatory Visit: Payer: Self-pay

## 2019-04-05 DIAGNOSIS — I4891 Unspecified atrial fibrillation: Secondary | ICD-10-CM | POA: Diagnosis not present

## 2019-04-05 DIAGNOSIS — I10 Essential (primary) hypertension: Secondary | ICD-10-CM | POA: Insufficient documentation

## 2019-04-05 DIAGNOSIS — Z87891 Personal history of nicotine dependence: Secondary | ICD-10-CM | POA: Insufficient documentation

## 2019-04-05 DIAGNOSIS — R531 Weakness: Secondary | ICD-10-CM | POA: Insufficient documentation

## 2019-04-05 DIAGNOSIS — Z79899 Other long term (current) drug therapy: Secondary | ICD-10-CM | POA: Diagnosis not present

## 2019-04-05 DIAGNOSIS — R51 Headache: Secondary | ICD-10-CM | POA: Diagnosis present

## 2019-04-05 DIAGNOSIS — R519 Headache, unspecified: Secondary | ICD-10-CM

## 2019-04-05 LAB — URINALYSIS, ROUTINE W REFLEX MICROSCOPIC
Bacteria, UA: NONE SEEN
Bilirubin Urine: NEGATIVE
Glucose, UA: NEGATIVE mg/dL
Ketones, ur: NEGATIVE mg/dL
Leukocytes,Ua: NEGATIVE
Nitrite: NEGATIVE
Protein, ur: NEGATIVE mg/dL
Specific Gravity, Urine: 1.002 — ABNORMAL LOW (ref 1.005–1.030)
pH: 7 (ref 5.0–8.0)

## 2019-04-05 LAB — BASIC METABOLIC PANEL
Anion gap: 12 (ref 5–15)
BUN: <5 mg/dL — ABNORMAL LOW (ref 8–23)
CO2: 25 mmol/L (ref 22–32)
Calcium: 9.1 mg/dL (ref 8.9–10.3)
Chloride: 97 mmol/L — ABNORMAL LOW (ref 98–111)
Creatinine, Ser: 0.72 mg/dL (ref 0.61–1.24)
GFR calc Af Amer: >60 mL/min (ref >60)
GFR calc non Af Amer: >60 mL/min (ref >60)
Glucose, Bld: 122 mg/dL — ABNORMAL HIGH (ref 70–99)
Potassium: 3.4 mmol/L — ABNORMAL LOW (ref 3.5–5.1)
Sodium: 134 mmol/L — ABNORMAL LOW (ref 135–145)

## 2019-04-05 LAB — CBC
HCT: 37.8 % — ABNORMAL LOW (ref 39.0–52.0)
Hemoglobin: 13 g/dL (ref 13.0–17.0)
MCH: 36.7 pg — ABNORMAL HIGH (ref 26.0–34.0)
MCHC: 34.4 g/dL (ref 30.0–36.0)
MCV: 106.8 fL — ABNORMAL HIGH (ref 80.0–100.0)
Platelets: 147 10*3/uL — ABNORMAL LOW (ref 150–400)
RBC: 3.54 MIL/uL — ABNORMAL LOW (ref 4.22–5.81)
RDW: 14.8 % (ref 11.5–15.5)
WBC: 5.2 10*3/uL (ref 4.0–10.5)
nRBC: 0 % (ref 0.0–0.2)

## 2019-04-05 LAB — TROPONIN I (HIGH SENSITIVITY)
Troponin I (High Sensitivity): 3 ng/L (ref <18)
Troponin I (High Sensitivity): <2 ng/L (ref <18)

## 2019-04-05 LAB — CBG MONITORING, ED: Glucose-Capillary: 101 mg/dL — ABNORMAL HIGH (ref 70–99)

## 2019-04-05 MED ORDER — SODIUM CHLORIDE 0.9 % IV BOLUS
1000.0000 mL | Freq: Once | INTRAVENOUS | Status: AC
  Administered 2019-04-05: 1000 mL via INTRAVENOUS

## 2019-04-05 MED ORDER — ETOMIDATE 2 MG/ML IV SOLN
INTRAVENOUS | Status: AC | PRN
  Administered 2019-04-05: 16 mg via INTRAVENOUS
  Administered 2019-04-05: 4 mg via INTRAVENOUS

## 2019-04-05 MED ORDER — SODIUM CHLORIDE 0.9% FLUSH
3.0000 mL | Freq: Once | INTRAVENOUS | Status: AC
  Administered 2019-04-05: 3 mL via INTRAVENOUS

## 2019-04-05 MED ORDER — ETOMIDATE 2 MG/ML IV SOLN
20.0000 mg | Freq: Once | INTRAVENOUS | Status: DC
  Filled 2019-04-05: qty 10

## 2019-04-05 NOTE — Sedation Documentation (Signed)
Shock delivered at 120J 

## 2019-04-05 NOTE — Discharge Instructions (Signed)
Please read and follow all provided instructions.  Your diagnoses today include:  1. Atrial fibrillation with RVR ([place])   2. Acute nonintractable headache, unspecified headache type     Tests performed today include:  CT of your head which was normal and did not show any serious cause of your headache  EKG showed atrial fibrillation at a very fast rate  Blood counts and electrolytes  Urine test  Cardiac enzymes - no signs of heart muscle damage  Vital signs. See below for your results today.   Medications:   None  Additional information:  Follow any educational materials contained in this packet.  You are having a headache. No specific cause was found today for your headache. It may have been a migraine or other cause of headache. Stress, anxiety, fatigue, and depression are common triggers for headaches.   Your headache today does not appear to be life-threatening or require hospitalization, but often the exact cause of headaches is not determined in the emergency department. Therefore, follow-up with your doctor is very important to find out what may have caused your headache and whether or not you need any further diagnostic testing or treatment.   Sometimes headaches can appear benign (not harmful), but then more serious symptoms can develop which should prompt an immediate re-evaluation by your doctor or the emergency department.  BE VERY CAREFUL not to take multiple medicines containing Tylenol (also called acetaminophen). Doing so can lead to an overdose which can damage your liver and cause liver failure and possibly death.   Follow-up instructions: Please follow-up with your primary care provider in the next 2 days for further evaluation of your symptoms.   Return instructions:   Please return to the Emergency Department if you experience worsening symptoms.  Return if the medications do not resolve your headache, if it recurs, or if you have multiple episodes of  vomiting or cannot keep down fluids.  Return if you have a change from the usual headache.  RETURN IMMEDIATELY IF you:  Develop a sudden, severe headache  Develop confusion or become poorly responsive or faint  Develop a fever above 100.68F or problem breathing  Have a change in speech, vision, swallowing, or understanding  Develop new weakness, numbness, tingling, incoordination in your arms or legs  Have a seizure  Please return if you have any other emergent concerns.  Additional Information:  Your vital signs today were: BP (!) 167/86 (BP Location: Right Arm)    Pulse (!) 106    Temp 98.7 F (37.1 C) (Oral)    Resp 16    SpO2 96%  If your blood pressure (BP) was elevated above 135/85 this visit, please have this repeated by your doctor within one month. --------------

## 2019-04-05 NOTE — Sedation Documentation (Signed)
Pt experiencing tremors; potential side effect of medication per MD

## 2019-04-05 NOTE — ED Notes (Signed)
Pt discharged from ED; instructions provided; Pt encouraged to return to ED if symptoms worsen and to f/u with PCP; Pt verbalized understanding of all instructions 

## 2019-04-05 NOTE — ED Provider Notes (Signed)
.  Sedation  Date/Time: 04/05/2019 9:56 PM Performed by: Hayden Rasmussen, MD Authorized by: Hayden Rasmussen, MD   Consent:    Consent obtained:  Verbal   Consent given by:  Patient   Risks discussed:  Allergic reaction, dysrhythmia, inadequate sedation, nausea, prolonged hypoxia resulting in organ damage, prolonged sedation necessitating reversal, respiratory compromise necessitating ventilatory assistance and intubation and vomiting   Alternatives discussed:  Analgesia without sedation, anxiolysis and regional anesthesia Universal protocol:    Procedure explained and questions answered to patient or proxy's satisfaction: yes     Relevant documents present and verified: yes     Test results available and properly labeled: yes     Imaging studies available: yes     Required blood products, implants, devices, and special equipment available: yes     Site/side marked: yes     Immediately prior to procedure a time out was called: yes     Patient identity confirmation method:  Verbally with patient Indications:    Procedure necessitating sedation performed by:  Physician performing sedation Pre-sedation assessment:    Time since last food or drink:  4hours   ASA classification: class 2 - patient with mild systemic disease     Neck mobility: normal     Mouth opening:  3 or more finger widths   Thyromental distance:  4 finger widths   Mallampati score:  I - soft palate, uvula, fauces, pillars visible   Pre-sedation assessments completed and reviewed: airway patency, cardiovascular function, hydration status, mental status, nausea/vomiting, pain level, respiratory function and temperature     Pre-sedation assessment completed:  04/05/2019 9:00 PM Immediate pre-procedure details:    Reassessment: Patient reassessed immediately prior to procedure     Reviewed: vital signs, relevant labs/tests and NPO status     Verified: bag valve mask available, emergency equipment available, intubation  equipment available, IV patency confirmed, oxygen available and suction available   Procedure details (see MAR for exact dosages):    Preoxygenation:  Nasal cannula   Sedation:  Etomidate   Intra-procedure monitoring:  Blood pressure monitoring, cardiac monitor, continuous pulse oximetry, frequent LOC assessments, frequent vital sign checks and continuous capnometry   Intra-procedure events: none     Total Provider sedation time (minutes):  15 Post-procedure details:    Post-sedation assessment completed:  04/05/2019 9:57 PM   Attendance: Constant attendance by certified staff until patient recovered     Recovery: Patient returned to pre-procedure baseline     Post-sedation assessments completed and reviewed: airway patency, cardiovascular function, hydration status, mental status, nausea/vomiting, pain level, respiratory function and temperature     Patient is stable for discharge or admission: yes     Patient tolerance:  Tolerated well, no immediate complications      Hayden Rasmussen, MD 04/06/19 [phone]

## 2019-04-05 NOTE — ED Provider Notes (Addendum)
[facility]   CSN: [id number] Arrival date & time: 04/05/19  1419     History   Chief Complaint Chief Complaint  Patient presents with  . Headache  . Weakness    HPI Jeffrey Randolph is a 63 y.o. male.     Patient with history of stroke, hypertension, atrial fibrillation on apixaban --presents the emergency department with complaint of headache and generalized weakness.  Patient has had symptoms for about 3 days.  Headache is a 5 out of 10 and is located all over his head.  He states that he has not had a headache in 30 years.  He was concerned that he had a sinus infection.  He has been taking antihistamines without relief.  He also states that he has not felt hungry.  No chest pain, shortness of breath, abdominal pain.  No fevers or cough.  No N/V/D.  No ear pain or runny nose.  Patient transported to the hospital by EMS.  Normal sinus rhythm in route per EMS.  Atrial fibrillation with rapid ventricular response noted at time of initial exam.       Past Medical History:  Diagnosis Date  . BPH (benign prostatic hyperplasia)   . CVA (cerebral infarction)   . GERD (gastroesophageal reflux disease)   . Hypertension   . Stroke [facility](H[facility])    Left foot always feel 2 x larger, walks with a limp.    Patient Active Problem List   Diagnosis Date Noted  . HNP (herniated nucleus pulposus), lumbar 01/09/2015    Past Surgical History:  Procedure Laterality Date  . LUMBAR LAMINECTOMY/DECOMPRESSION MICRODISCECTOMY Right 01/09/2015   Procedure: LUMBAR LAMINECTOMY/DECOMPRESSION MICRODISCECTOMY 1 LEVEL;  Surgeon: Donalee CitrinGary Cram, MD;  Location: [facility]UR[facility] OR[facility];  Service: Neurosurgery;  Laterality: Right;  LUMBAR LAMINECTOMY/DECOMPRESSION MICRODISCECTOMY 1 LEVEL RIGHT LUMBAR 2-3  . TONSILLECTOMY          Home Medications    Prior to Admission medications   Medication Sig Start Date End Date Taking? Authorizing Provider  amitriptyline (ELAVIL) 25 MG  tablet Take 25 mg by mouth daily. 09/11/18   [provider]  apixaban (ELIQUIS) 5 MG TABS tablet Take 1 tablet (5 mg total) by mouth 2 (two) times daily for 30 days. 12/22/18 01/21/19  Sabas SousBero, Michael M, MD  gabapentin (NEURONTIN) 300 MG capsule Take 600 mg by mouth 3 (three) times daily.    [provider]  hydrochlorothiazide (HYDRODIURIL) 25 MG tablet Take 25 mg by mouth daily. 12/10/18   [provider]  losartan (COZAAR) 100 MG tablet Take 100 mg by mouth daily. 12/10/18   [provider]  omeprazole (PRILOSEC) 40 MG capsule Take 40 mg by mouth daily.    [provider]  oxyCODONE-acetaminophen (PERCOCET/ROXICET) 5-325 MG per tablet Take 1-2 tablets by mouth every 4 (four) hours as needed for moderate pain. 01/10/15   Donalee Citrinram, Gary, MD  tamsulosin (FLOMAX) 0.4 MG CAPS capsule Take 0.4 mg by mouth at bedtime.    [provider]    Family History No family history on file.  Social History Social History   Tobacco Use  . Smoking status: Former Smoker    Years: 3.00  . Smokeless tobacco: Never Used  . Tobacco comment: quit smoking in 1980's  Substance Use Topics  . Alcohol use: No  . Drug use: No     Allergies   Patient has no known allergies.   Review of Systems Review of Systems  Constitutional:  Positive for appetite change and fatigue. Negative for chills and fever.  HENT: Negative for rhinorrhea and sore throat.   Eyes: Negative for redness.  Respiratory: Negative for cough.   Cardiovascular: Negative for chest pain.  Gastrointestinal: Negative for abdominal pain, diarrhea, nausea and vomiting.  Genitourinary: Negative for dysuria.  Musculoskeletal: Negative for myalgias.  Skin: Negative for rash.  Neurological: Positive for weakness (generalized) and headaches.     Physical Exam Updated Vital Signs BP (!) 160/85   Pulse (!) 137   Temp 98.6 F (37 C) (Oral)   Resp 11   SpO2 99%   Physical Exam Vitals signs and  nursing note reviewed.  Constitutional:      Appearance: He is well-developed. He is not diaphoretic.  HENT:     Head: Normocephalic and atraumatic.     Right Ear: Tympanic membrane, ear canal and external ear normal.     Left Ear: Tympanic membrane, ear canal and external ear normal.     Nose: Nose normal.     Mouth/Throat:     Mouth: Mucous membranes are not dry.     Pharynx: Oropharynx is clear. No pharyngeal swelling or oropharyngeal exudate.  Eyes:     Conjunctiva/sclera: Conjunctivae normal.  Neck:     Musculoskeletal: Normal range of motion and neck supple. No muscular tenderness.     Vascular: Normal carotid pulses. No carotid bruit or JVD.     Trachea: Trachea normal. No tracheal deviation.  Cardiovascular:     Rate and Rhythm: Tachycardia present. Rhythm irregular.     Pulses: No decreased pulses.     Heart sounds: Normal heart sounds, S1 normal and S2 normal. Heart sounds not distant. No murmur.  Pulmonary:     Effort: Pulmonary effort is normal. No respiratory distress.     Breath sounds: Normal breath sounds. No wheezing.  Chest:     Chest wall: No tenderness.  Abdominal:     General: Bowel sounds are normal.     Palpations: Abdomen is soft.     Tenderness: There is no abdominal tenderness. There is no guarding or rebound.  Skin:    General: Skin is warm and dry.     Coloration: Skin is not pale.  Neurological:     Mental Status: He is alert.      ED Treatments / Results  Labs (all labs ordered are listed, but only abnormal results are displayed) Labs Reviewed  BASIC METABOLIC PANEL - Abnormal; Notable for the following components:      Result Value   Sodium 134 (*)    Potassium 3.4 (*)    Chloride 97 (*)    Glucose, Bld 122 (*)    BUN <5 (*)    All other components within normal limits  CBC - Abnormal; Notable for the following components:   RBC 3.54 (*)    HCT 37.8 (*)    MCV 106.8 (*)    MCH 36.7 (*)    Platelets 147 (*)    All other components  within normal limits  URINALYSIS, ROUTINE W REFLEX MICROSCOPIC - Abnormal; Notable for the following components:   Color, Urine STRAW (*)    Specific Gravity, Urine 1.002 (*)    Hgb urine dipstick SMALL (*)    All other components within normal limits  CBG MONITORING, ED - Abnormal; Notable for the following components:   Glucose-Capillary 101 (*)    All other components within normal limits  TROPONIN I (HIGH SENSITIVITY)  TROPONIN I (  HIGH SENSITIVITY)    EKG EKG Interpretation  Date/Time:  Monday April 05 2019 18:04:24 EDT Ventricular Rate:  128 PR Interval:    QRS Duration: 95 QT Interval:  326 QTC Calculation: 476 R Axis:   58 Text Interpretation:  Atrial fibrillation Abnormal R-wave progression, early transition Minimal ST depression, diffuse leads Borderline prolonged QT interval Baseline wander in lead(s) V2 new afib and tachyardia compared with prior today Confirmed by Aletta Edouard [phone]) on 04/05/2019 6:27:45 PM   Radiology Ct Head Wo Contrast  Result Date: 04/05/2019 CLINICAL DATA:  Atypical headache.  On anticoagulation EXAM: CT HEAD WITHOUT CONTRAST TECHNIQUE: Contiguous axial images were obtained from the base of the skull through the vertex without intravenous contrast. COMPARISON:  CT head 03/05/2012 FINDINGS: Brain: Moderate atrophy, with progression. Moderate chronic white matter changes, with progression. Negative for acute infarct, hemorrhage, mass. Vascular: Negative for hyperdense vessel Skull: Negative Sinuses/Orbits: Negative Other: None IMPRESSION: Atrophy and chronic microvascular ischemic change in the white matter. No acute intracranial abnormality. Electronically Signed   By: Franchot Gallo M.D.   On: 04/05/2019 18:59    Procedures .Cardioversion  Date/Time: 04/05/2019 10:00 PM Performed by: Carlisle Cater, PA-C Authorized by: Carlisle Cater, PA-C   Consent:    Consent obtained:  Verbal   Consent given by:  Patient   Risks discussed:  Cutaneous  burn, death, pain and induced arrhythmia   Alternatives discussed:  Rate-control medication Pre-procedure details:    Cardioversion basis:  Emergent   Rhythm:  Atrial fibrillation   Electrode placement:  Anterior-posterior Patient sedated: Yes. Refer to sedation procedure documentation for details of sedation.  Attempt one:    Cardioversion mode:  Synchronous   Waveform:  Monophasic   Shock (Joules):  120   Shock outcome:  Conversion to normal sinus rhythm Post-procedure details:    Patient status:  Awake   Patient tolerance of procedure:  Tolerated well, no immediate complications Comments:     Pt tolerated well with some transient myoclonic jerks.      (including critical care time)  Medications Ordered in ED Medications  etomidate (AMIDATE) injection 20 mg (has no administration in time range)  sodium chloride flush (NS) 0.9 % injection 3 mL (3 mLs Intravenous Given 04/05/19 1821)  sodium chloride 0.9 % bolus 1,000 mL (0 mLs Intravenous Stopped 04/05/19 1907)  etomidate (AMIDATE) injection (4 mg Intravenous Given 04/05/19 2123)     Initial Impression / Assessment and Plan / ED Course  I have reviewed the triage vital signs and the nursing notes.  Pertinent labs & imaging results that were available during my care of the patient were reviewed by me and considered in my medical decision making (see chart for details).  Clinical Course as of Apr 05 2151  Mon Apr 05, 3115  3333 63 year old male complaining of headache runny nose sore throat along with some fatigue.  He has a prior history of A. fib and is on anticoagulation.  He is currently now back in A. fib with a rapid ventricular response.  Checking some labs and may end up needing cardioversion.   [MB]  2130 After informed consent patient was sedated with etomidate and cardioverted with 120 J synchronized.  Initially he did not seem to convert but as we are giving him a little bit more sedation for another shock he slowed  down his heart rate was 100 and regular.  He still is a lot of clonic activity from the etomidate but we will get another EKG  and see if he is in sinus now.   [MB]    Clinical Course User Index [MB] Terrilee FilesButler, Michael C, MD       Patient seen and examined. Work-up initiated. Medications ordered.   Vital signs reviewed and are as follows: BP (!) 160/85   Pulse (!) 137   Temp 98.6 F (37 C) (Oral)   Resp 11   SpO2 99%   CT reviewed.  No signs of bleed.  Discussed DC cardioversion versus IV medications for control of A. fib.  Patient is agreeable to attempt at DC cardioversion as he has had this before.  Discussed patient with Dr. Charm BargesButler who has seen.   9:52 PM Cardioversion completed. Pt tolerated well overall, some myoclonic jerking.   10:49 PM patient reassessed, states "I am already feeling better".  He is sitting in the room at his baseline, conversant, drinking water.  Plan to discharge to home.  Encourage PCP follow-up in the next 2 days.  Encouraged return with worsening symptoms, chest pain, shortness of breath, new symptoms or other concerns.  This patients CHA2DS2-VASc Score and unadjusted Ischemic Stroke Rate (% per year) is equal to 3.2 % stroke rate/year from a score of 3.  Above score calculated as 1 point each if present [CHF, HTN, DM, Vascular=MI/PAD/Aortic Plaque, Age if 65-74, or Male] Above score calculated as 2 points each if present [Age > 75, or Stroke/TIA/TE]    CRITICAL CARE Performed by: Renne CriglerJoshua Kitara Hebb PA-C Total critical care time: 40 minutes Critical care time was exclusive of separately billable procedures and treating other patients. Critical care was necessary to treat or prevent imminent or life-threatening deterioration. Critical care was time spent personally by me on the following activities: development of treatment plan with patient and/or surrogate as well as nursing, discussions with consultants, evaluation of patient's response to treatment,  examination of patient, obtaining history from patient or surrogate, ordering and performing treatments and interventions, ordering and review of laboratory studies, ordering and review of radiographic studies, pulse oximetry and re-evaluation of patient's condition.   Final Clinical Impressions(s) / ED Diagnoses   Final diagnoses:  Atrial fibrillation with RVR (HCC)  Acute nonintractable headache, unspecified headache type   Patient with c/o HA, generalized weakness. HA without neuro deficit but atypical for patient.  CT of the head without any signs of bleeding, masses, other acute problems.  Atrial fibrillation with rapid ventricular response treated with DC cardioversion with good results.  Patient feeling better and return to baseline after procedure.  No indications for further work-up or treatment at this time.  We will discharged home.  ED Discharge Orders    None       Renne CriglerGeiple, Tambi Thole, Cordelia Poche-C 04/05/19 2301    Terrilee FilesButler, Michael C, MD 04/06/19 0924    Renne CriglerGeiple, Tauheedah Bok, PA-C 04/06/19 1914    Terrilee FilesButler, Michael C, MD 04/07/19 [phone]

## 2019-04-05 NOTE — ED Triage Notes (Signed)
Patient in via [facility] from home c/o generalized weakness and mild headache x 3 days. States he feels like he has a sinus infection and has been taking zyrtec without much relief. Also endorses loss of appetite. EMS VS: BP initially 160 palp - + orthostatic changes, CBG 138, HR 65 NSR.

## 2019-04-28 ENCOUNTER — Telehealth: Payer: Self-pay

## 2019-04-28 NOTE — Telephone Encounter (Signed)
NOTES FROM [facility] [phone], REFERRAL SENT TO SCHEDULING 

## 2019-05-25 ENCOUNTER — Encounter (HOSPITAL_COMMUNITY): Payer: Self-pay

## 2019-05-25 ENCOUNTER — Observation Stay (HOSPITAL_COMMUNITY)
Admission: EM | Admit: 2019-05-25 | Discharge: 2019-05-26 | Disposition: A | Discharge status: Home / Self Care | Payer: Medicaid Other | Attending: Cardiovascular Disease | Admitting: Cardiovascular Disease

## 2019-05-25 ENCOUNTER — Other Ambulatory Visit: Payer: Self-pay

## 2019-05-25 ENCOUNTER — Emergency Department (HOSPITAL_COMMUNITY): Payer: Medicaid Other

## 2019-05-25 DIAGNOSIS — N400 Enlarged prostate without lower urinary tract symptoms: Secondary | ICD-10-CM | POA: Insufficient documentation

## 2019-05-25 DIAGNOSIS — Z87891 Personal history of nicotine dependence: Secondary | ICD-10-CM | POA: Diagnosis not present

## 2019-05-25 DIAGNOSIS — I4891 Unspecified atrial fibrillation: Secondary | ICD-10-CM | POA: Diagnosis present

## 2019-05-25 DIAGNOSIS — D72829 Elevated white blood cell count, unspecified: Secondary | ICD-10-CM | POA: Insufficient documentation

## 2019-05-25 DIAGNOSIS — I10 Essential (primary) hypertension: Secondary | ICD-10-CM | POA: Diagnosis not present

## 2019-05-25 DIAGNOSIS — I480 Paroxysmal atrial fibrillation: Principal | ICD-10-CM | POA: Insufficient documentation

## 2019-05-25 DIAGNOSIS — K219 Gastro-esophageal reflux disease without esophagitis: Secondary | ICD-10-CM | POA: Insufficient documentation

## 2019-05-25 DIAGNOSIS — I69354 Hemiplegia and hemiparesis following cerebral infarction affecting left non-dominant side: Secondary | ICD-10-CM | POA: Insufficient documentation

## 2019-05-25 DIAGNOSIS — Z7901 Long term (current) use of anticoagulants: Secondary | ICD-10-CM | POA: Diagnosis not present

## 2019-05-25 DIAGNOSIS — Z20828 Contact with and (suspected) exposure to other viral communicable diseases: Secondary | ICD-10-CM | POA: Insufficient documentation

## 2019-05-25 DIAGNOSIS — Z8673 Personal history of transient ischemic attack (TIA), and cerebral infarction without residual deficits: Secondary | ICD-10-CM | POA: Diagnosis not present

## 2019-05-25 DIAGNOSIS — E876 Hypokalemia: Secondary | ICD-10-CM

## 2019-05-25 DIAGNOSIS — Z79899 Other long term (current) drug therapy: Secondary | ICD-10-CM | POA: Insufficient documentation

## 2019-05-25 DIAGNOSIS — I088 Other rheumatic multiple valve diseases: Secondary | ICD-10-CM | POA: Insufficient documentation

## 2019-05-25 HISTORY — DX: Unspecified atrial fibrillation: I48.91

## 2019-05-25 HISTORY — DX: Hypokalemia: E87.6

## 2019-05-25 LAB — BASIC METABOLIC PANEL
Anion gap: 15 (ref 5–15)
BUN: <5 mg/dL — ABNORMAL LOW (ref 8–23)
CO2: 21 mmol/L — ABNORMAL LOW (ref 22–32)
Calcium: 9 mg/dL (ref 8.9–10.3)
Chloride: 89 mmol/L — ABNORMAL LOW (ref 98–111)
Creatinine, Ser: 0.62 mg/dL (ref 0.61–1.24)
GFR calc Af Amer: >60 mL/min (ref >60)
GFR calc non Af Amer: >60 mL/min (ref >60)
Glucose, Bld: 124 mg/dL — ABNORMAL HIGH (ref 70–99)
Potassium: 3.2 mmol/L — ABNORMAL LOW (ref 3.5–5.1)
Sodium: 125 mmol/L — ABNORMAL LOW (ref 135–145)

## 2019-05-25 LAB — CBC
HCT: 39.4 % (ref 39.0–52.0)
Hemoglobin: 13.8 g/dL (ref 13.0–17.0)
MCH: 36.4 pg — ABNORMAL HIGH (ref 26.0–34.0)
MCHC: 35 g/dL (ref 30.0–36.0)
MCV: 104 fL — ABNORMAL HIGH (ref 80.0–100.0)
Platelets: 205 10*3/uL (ref 150–400)
RBC: 3.79 MIL/uL — ABNORMAL LOW (ref 4.22–5.81)
RDW: 13.8 % (ref 11.5–15.5)
WBC: 14.4 10*3/uL — ABNORMAL HIGH (ref 4.0–10.5)
nRBC: 0.1 % (ref 0.0–0.2)

## 2019-05-25 LAB — MRSA PCR SCREENING: MRSA by PCR: NEGATIVE

## 2019-05-25 LAB — SARS CORONAVIRUS 2 BY RT PCR (HOSPITAL ORDER, PERFORMED IN ~~LOC~~ HOSPITAL LAB): SARS Coronavirus 2: NEGATIVE

## 2019-05-25 LAB — MAGNESIUM: Magnesium: 1.6 mg/dL — ABNORMAL LOW (ref 1.7–2.4)

## 2019-05-25 MED ORDER — MAGNESIUM SULFATE IN D5W 1-5 GM/100ML-% IV SOLN
1.0000 g | Freq: Once | INTRAVENOUS | Status: AC
  Administered 2019-05-25: 1 g via INTRAVENOUS
  Filled 2019-05-25: qty 100

## 2019-05-25 MED ORDER — ETOMIDATE 2 MG/ML IV SOLN
INTRAVENOUS | Status: AC | PRN
  Administered 2019-05-25: 10 mg via INTRAVENOUS

## 2019-05-25 MED ORDER — LOSARTAN POTASSIUM 50 MG PO TABS
100.0000 mg | ORAL_TABLET | Freq: Every day | ORAL | Status: DC
  Administered 2019-05-25: 19:00:00 100 mg via ORAL
  Filled 2019-05-25: qty 2

## 2019-05-25 MED ORDER — DILTIAZEM LOAD VIA INFUSION
20.0000 mg | Freq: Once | INTRAVENOUS | Status: AC
  Administered 2019-05-25: 20 mg via INTRAVENOUS
  Filled 2019-05-25: qty 20

## 2019-05-25 MED ORDER — ACETAMINOPHEN 325 MG PO TABS: 650.0000 mg | ORAL_TABLET | ORAL | Status: DC | PRN

## 2019-05-25 MED ORDER — ETOMIDATE 2 MG/ML IV SOLN
10.0000 mg | Freq: Once | INTRAVENOUS | Status: DC
  Filled 2019-05-25: qty 10

## 2019-05-25 MED ORDER — FENTANYL CITRATE (PF) 100 MCG/2ML IJ SOLN
50.0000 ug | Freq: Once | INTRAMUSCULAR | Status: DC
  Filled 2019-05-25: qty 2

## 2019-05-25 MED ORDER — DILTIAZEM HCL-DEXTROSE 100-5 MG/100ML-% IV SOLN (PREMIX)
5.0000 mg/h | INTRAVENOUS | Status: DC
  Administered 2019-05-25 (×2): 5 mg/h via INTRAVENOUS
  Filled 2019-05-25 (×3): qty 100

## 2019-05-25 MED ORDER — ONDANSETRON HCL 4 MG/2ML IJ SOLN: 4.0000 mg | Freq: Four times a day (QID) | INTRAMUSCULAR | Status: DC | PRN

## 2019-05-25 MED ORDER — CHLORHEXIDINE GLUCONATE CLOTH 2 % EX PADS
6.0000 | MEDICATED_PAD | Freq: Every day | CUTANEOUS | Status: DC
  Administered 2019-05-26: 6 via TOPICAL

## 2019-05-25 MED ORDER — TAMSULOSIN HCL 0.4 MG PO CAPS
0.4000 mg | ORAL_CAPSULE | Freq: Every day | ORAL | Status: DC
  Administered 2019-05-25: 0.4 mg via ORAL
  Filled 2019-05-25: qty 1

## 2019-05-25 MED ORDER — APIXABAN 5 MG PO TABS
5.0000 mg | ORAL_TABLET | Freq: Two times a day (BID) | ORAL | Status: DC
  Administered 2019-05-25 – 2019-05-26 (×2): 5 mg via ORAL
  Filled 2019-05-25 (×2): qty 1

## 2019-05-25 MED ORDER — POTASSIUM CHLORIDE CRYS ER 20 MEQ PO TBCR
40.0000 meq | EXTENDED_RELEASE_TABLET | Freq: Once | ORAL | Status: AC
  Administered 2019-05-25: 40 meq via ORAL
  Filled 2019-05-25: qty 2

## 2019-05-25 MED ORDER — GABAPENTIN 300 MG PO CAPS
600.0000 mg | ORAL_CAPSULE | Freq: Three times a day (TID) | ORAL | Status: DC
  Administered 2019-05-25 – 2019-05-26 (×4): 600 mg via ORAL
  Filled 2019-05-25 (×4): qty 2

## 2019-05-25 MED ORDER — FENTANYL CITRATE (PF) 100 MCG/2ML IJ SOLN
INTRAMUSCULAR | Status: AC | PRN
  Administered 2019-05-25: 50 ug via INTRAVENOUS

## 2019-05-25 NOTE — Code Documentation (Addendum)
2nd 200J shock given

## 2019-05-25 NOTE — Code Documentation (Signed)
200J shock given

## 2019-05-25 NOTE — H&P (Addendum)
Cardiology Consultation:   Patient ID: Jeffrey LyeRandy Dupriest MRN: [id number]; DOB: 1956/08/01  Admit date: 05/25/2019 Date of Consult: 05/25/2019  Primary Care Provider: Lonie Peakonroy, Nathan, PA-C Primary Cardiologist: No primary care provider on file.  Primary Electrophysiologist:  None   CC: Palpitations  Patient Profile:   Jeffrey Randolph is a 63 y.o. male with a hx of stroke, hypertension, and recently diagnosed atrial fibrillation on Eliquis who is being seen today for the evaluation of Afib RVR at the request of Dr/ Jeraldine LootsLockwood.  History of Present Illness:  Cardiac history includes recent diagnosis of afib. On 3/17 patient presented to the ED for dizziness and pre-syncope and was found to be in new onset afib RVR, rates 130-180. Patient was successfully cardioverted and discharged on Eliquis 5 mg twice daily. He was referred to the Afib clinic but never followed-up. Patient returned to the ER in June 2020 for generalized weakness and headache x 3 days. In the ED EKG showed Atrial fib with RVR, 128 bpm. CT head showed no signs of acute changes. Patient was consented for cardioversion and he was shocked with 120J and converted to NSR and discharged.   Jeffrey Randolph presented to the ER 8/18 for palpitations for the past day. Palpitations started at around 2 pm yesterday while he was sitting down. Earlier that morning patient felt normal and had been doing some stuff around the house. Onset was acute and had no associated symptoms. Since the onset palpitations have been persistent. Denies chest pain, shortness of breath, or abdominal pain. No fevers or cough. Denies N/V. EMS was called and reported rate 150-200 bpm. 5mg  Metoprolol was given with improvement to 100-150 bpm. 324 ASA was also given.    In the ED BP 161/134, 119 bpm, Temp 99, Resp 20, O2 98%. Potassium 3.2, Glucose 124, Creatinine 0.62, WBC 14.4, Hgb 13.8. Magnesium was found to be low, 1.6 and he was palced on IV mag. Patient says he has not missed a dose  of Eliquis. In the ER patent was consented for cardioversion and was shocked 200J. Patient did not convert to NSR. Patient was placed on IV cardizem. Cardiology was consulted for further work-up.   Patient says he is feeling better. He denies any chest pain or shortness of breath. He denies known history of CAD, MI, thyroid disease, PVD, high cholesterol, or heart failure. Denies known family history of heart disease. Denies alcohol/tobacco/drug use. Patient has not worked since his stroke. He is relatively active at baseline. Patient says he has a follow-up appointment with Dr. Mayford Knifeurner   Heart Pathway Score:     Past Medical History:  Diagnosis Date  . BPH (benign prostatic hyperplasia)   . CVA (cerebral infarction)   . GERD (gastroesophageal reflux disease)   . Hypertension   . Stroke [facility](HCC)    Left foot always feel 2 x larger, walks with a limp.    Past Surgical History:  Procedure Laterality Date  . LUMBAR LAMINECTOMY/DECOMPRESSION MICRODISCECTOMY Right 01/09/2015   Procedure: LUMBAR LAMINECTOMY/DECOMPRESSION MICRODISCECTOMY 1 LEVEL;  Surgeon: Donalee CitrinGary Cram, MD;  Location: [facility];  Service: Neurosurgery;  Laterality: Right;  LUMBAR LAMINECTOMY/DECOMPRESSION MICRODISCECTOMY 1 LEVEL RIGHT LUMBAR 2-3  . TONSILLECTOMY       Home Medications:  Prior to Admission medications   Medication Sig Start Date End Date Taking? Authorizing Provider  amitriptyline (ELAVIL) 25 MG tablet Take 25 mg by mouth daily. 09/11/18   [provider]  apixaban (ELIQUIS) 5 MG TABS tablet Take 1 tablet (5 mg  total) by mouth 2 (two) times daily for 30 days. 12/22/18 01/21/19  Sabas SousBero, Michael M, MD  gabapentin (NEURONTIN) 300 MG capsule Take 600 mg by mouth 3 (three) times daily.    [provider]  hydrochlorothiazide (HYDRODIURIL) 25 MG tablet Take 25 mg by mouth daily. 12/10/18   [provider]  losartan (COZAAR) 100 MG tablet Take 100 mg by mouth daily. 12/10/18   [provider]   omeprazole (PRILOSEC) 40 MG capsule Take 40 mg by mouth daily.    [provider]  oxyCODONE-acetaminophen (PERCOCET/ROXICET) 5-325 MG per tablet Take 1-2 tablets by mouth every 4 (four) hours as needed for moderate pain. 01/10/15   Donalee Citrinram, Gary, MD  tamsulosin (FLOMAX) 0.4 MG CAPS capsule Take 0.4 mg by mouth at bedtime.    [provider]    Inpatient Medications: Scheduled Meds: . diltiazem  20 mg Intravenous Once  . etomidate  10 mg Intravenous Once  . fentaNYL (SUBLIMAZE) injection  50 mcg Intravenous Once   Continuous Infusions: . diltiazem (CARDIZEM) infusion    . magnesium sulfate bolus IVPB     PRN Meds:   Allergies:   No Known Allergies  Social History:   Social History   Socioeconomic History  . Marital status: Single    Spouse name: Not on file  . Number of children: Not on file  . Years of education: Not on file  . Highest education level: Not on file  Occupational History  . Not on file  Social Needs  . Financial resource strain: Not on file  . Food insecurity    Worry: Not on file    Inability: Not on file  . Transportation needs    Medical: Not on file    Non-medical: Not on file  Tobacco Use  . Smoking status: Former Smoker    Years: 3.00  . Smokeless tobacco: Never Used  . Tobacco comment: quit smoking in 1980's  Substance and Sexual Activity  . Alcohol use: No  . Drug use: No  . Sexual activity: Not on file  Lifestyle  . Physical activity    Days per week: Not on file    Minutes per session: Not on file  . Stress: Not on file  Relationships  . Social Musicianconnections    Talks on phone: Not on file    Gets together: Not on file    Attends religious service: Not on file    Active member of club or organization: Not on file    Attends meetings of clubs or organizations: Not on file    Relationship status: Not on file  . Intimate partner violence    Fear of current or ex partner: Not on file    Emotionally abused: Not on file     Physically abused: Not on file    Forced sexual activity: Not on file  Other Topics Concern  . Not on file  Social History Narrative  . Not on file    Family History:   No known family history of heart disease   ROS:  Please see the history of present illness.  All other ROS reviewed and negative.     Physical Exam/Data:   Vitals:   05/25/19 1315 05/25/19 1319 05/25/19 1327 05/25/19 1330  BP:      Pulse: (!) 115   (!) 123  Resp: (!) 21   18  Temp:  99.2 F (37.3 C) 99.2 F (37.3 C)   TempSrc:  Oral Oral  SpO2: 100%   99%  Weight:      Height:       No intake or output data in the 24 hours ending 05/25/19 1341 Last 3 Weights 05/25/2019 12/22/2018 02/14/2018  Weight (lbs) 180 lb 177 lb 185 lb  Weight (kg) 81.647 kg 80.287 kg 83.915 kg     Body mass index is 26.58 kg/m.  General:  Well nourished, well developed, in no acute distress HEENT: normal Lymph: no adenopathy Neck: no JVD Endocrine:  No thryomegaly Vascular: No carotid bruits; Pedal 2+ bilaterally without bruits  Cardiac:  normal S1, S2; Irreg Irreg; no murmur  Lungs:  clear to auscultation bilaterally, no wheezing, rhonchi or rales  Abd: soft, nontender, no hepatomegaly  Ext: no edema Musculoskeletal:  No deformities, BUE and BLE strength normal and equal Skin: warm and dry  Neuro:  CNs 2-12 intact, no focal abnormalities noted Psych:  Normal affect   EKG:  The EKG was personally reviewed and demonstrates:  Atrial fibrillation, 152 bpm Telemetry:  Telemetry was personally reviewed and demonstrates:  Afib RVR, rates 110-140s. During exam rates 96-105  Relevant CV Studies:  N/A  Laboratory Data:  High Sensitivity Troponin:  No results for input(s): TROPONINIHS in the last 720 hours.   Cardiac EnzymesNo results for input(s): TROPONINI in the last 168 hours. No results for input(s): TROPIPOC in the last 168 hours.  Chemistry Recent Labs  Lab 05/25/19 1152  NA 125*  K 3.2*  CL 89*  CO2 21*  GLUCOSE  124*  BUN <5*  CREATININE 0.62  CALCIUM 9.0  GFRNONAA >60  GFRAA >60  ANIONGAP 15    No results for input(s): PROT, ALBUMIN, AST, ALT, ALKPHOS, BILITOT in the last 168 hours. Hematology Recent Labs  Lab 05/25/19 1152  WBC 14.4*  RBC 3.79*  HGB 13.8  HCT 39.4  MCV 104.0*  MCH 36.4*  MCHC 35.0  RDW 13.8  PLT 205   BNPNo results for input(s): BNP, PROBNP in the last 168 hours.  DDimer No results for input(s): DDIMER in the last 168 hours.   Radiology/Studies:  No results found.  Assessment and Plan:   Afib with RVR Since the beginning of the year patient has had 2 ED visits found to be in Afib RVR and treated with successful cardioversion. At the first visit in March he was placed on Eliquis 5 mg BID and referred to the Afib clinic, but never followed up.  - Patient presents to the ER again 8/18 for palpitations x 1 day. EMS reports initial HR 150-200 and 5 mg Metoprolol was given with improvement to 100-150. ASA 324 given. - In the ER EKG showed Afib RVR, 152 bpm. Patient consented for cardioversion and patient received 200J shock in the ED. Cardioversion was unsuccessful.  - Labs showed Potassium 3.2, Hgb 13.8, Glucose 124, Creatinine 0.62 - Magnesium 1.6, on IV mag - WBC 14.4 - Patient placed on IV cardizem. - Rates are improving. On exam rates 96-106 - Admit to telemetry  - Check TSH - Will leave patient on IV Cardizem overnight in hopes patient will convert to NSR. Patient can be switched to PO Cardizem 120 mg daily tomorrow.  - Check echo - Labs daily - Patient has appointment with Dr. Radford Pax scheduled in September  Hypertension - Home meds include Losartan 100 daily and HCTZ 25 mg daily - BP stable, 127/74  Hypomagnesemia - 1.6 on admission - on IV mag - BMET tomorrow  Hypokalemia - 3.2 on admission -  Replete - BMET tomorrow  Leukocytosis - WBC 14.4 upon arrival. Temp 99 - Patient denies recent illness - CXR negative for acute changes - Monitor  with CBC   H/o of Stroke - left sided residual weakness   For questions or updates, please contact [facility] Please consult [email or web address] for contact info under     Signed, Cadence David StallH Furth, PA-C  05/25/2019 1:41 PM   I have personally seen and examined this patient with Cadence Fransico MichaelFurth, PA. I agree with the assessment and plan as outlined above. Jeffrey Randolph has a history of HTN, prior stroke and paroxysmal atrial fibrillation. [facility] with atrial fib with RVR and was cardioverted and sent home on Eliquis but no beta blocker or calcium channel blocker. He missed follow up in the atrial fib clinic. [facility] and was in atrial fib with RVR. He was again cardioverted and sent home with no beta blocker or calcium channel blocker. Cardiology was not consulted on either of those ED visits. He now presents to the ED today with c/o palpitations and heart rates over 150 bpm at home. He was found to be in atrial fib with RVR by EMS and was brought to the ED. He was cardioverted twice but did not convert to sinus. Cardiology was then called  He denies dyspnea, chest pain or LE edema.  EKG reviewed by me and shows atrial fib with RVR, rate 152 bpm  Labs reviewed by me  My exam:  General: Well developed, well nourished, NAD  HEENT: OP clear, mucus membranes moist  SKIN: warm, dry. No rashes.  Neuro: No focal deficits  Musculoskeletal: Muscle strength 5/5 all ext  Psychiatric: Mood and affect normal  Neck: No JVD, no carotid bruits, no thyromegaly, no lymphadenopathy.  Lungs:Clear bilaterally, no wheezes, rhonci, crackles  Cardiovascular: Irregular irregular. No murmurs, gallops or rubs.  Abdomen:Soft. Bowel sounds present. Non-tender.  Extremities: No lower extremity edema. Pulses are 2 + in the bilateral DP/PT.   Plan: Atrial fib with RVR: Third presentation over last 5 months with atrial fib with RVR. He has not been on a beta  blocker or a calcium channel blocker. Rate is now controlled with IV Cardizem. Will admit to telemetry. Will continue IV Cardizem. If he does not convert to sinus overnight, will convert to oral calcium channel blocker tomorrow. Will arrange an echo.   Verne CarrowChristopher   05/25/2019 3:30 PM

## 2019-05-25 NOTE — ED Triage Notes (Signed)
Pt here from home for palpitations since yesterday at 2pm. Per EMS rate 150-200, 5mg  metoprolol given with improvement to 100-150. 324 ASA given. Denies chest pain. Endorses pain around his neck when taking a deep breath this morning but has totally resolved on arrival. Hx two cardioversions within the last four months.

## 2019-05-25 NOTE — Code Documentation (Signed)
Consents verified

## 2019-05-25 NOTE — Plan of Care (Signed)

## 2019-05-25 NOTE — ED Provider Notes (Signed)
[facility] Note   CSN: [id number] Arrival date & time: 05/25/19  1117    History   Chief Complaint Chief Complaint  Patient presents with  . Palpitations    HPI Jeffrey LyeRandy Randolph is a 63 y.o. male.     HPI Patient with multiple medical issues including prior stroke, A. fib, hypertension presents with concern for pain, palpitations. Onset was yesterday, about 20 hours ago. Since that time symptoms been persistent apparently he notes that symptoms are similar to those he experienced with prior episodes of A. fib. Patient states that he has been taking his medication as directed including anticoagulant. He denies concurrent disease, concerns including fever, syncope, other pain. Though the patient has A. fib, he has not yet had his outpatient cardiology follow-up.  Past Medical History:  Diagnosis Date  . BPH (benign prostatic hyperplasia)   . CVA (cerebral infarction)   . GERD (gastroesophageal reflux disease)   . Hypertension   . Stroke [facility](HCC)    Left foot always feel 2 x larger, walks with a limp.    Patient Active Problem List   Diagnosis Date Noted  . HNP (herniated nucleus pulposus), lumbar 01/09/2015    Past Surgical History:  Procedure Laterality Date  . LUMBAR LAMINECTOMY/DECOMPRESSION MICRODISCECTOMY Right 01/09/2015   Procedure: LUMBAR LAMINECTOMY/DECOMPRESSION MICRODISCECTOMY 1 LEVEL;  Surgeon: Donalee CitrinGary Cram, MD;  Location: [facility];  Service: Neurosurgery;  Laterality: Right;  LUMBAR LAMINECTOMY/DECOMPRESSION MICRODISCECTOMY 1 LEVEL RIGHT LUMBAR 2-3  . TONSILLECTOMY          Home Medications    Prior to Admission medications   Medication Sig Start Date End Date Taking? Authorizing Provider  amitriptyline (ELAVIL) 25 MG tablet Take 25 mg by mouth daily. 09/11/18   [provider]  apixaban (ELIQUIS) 5 MG TABS tablet Take 1 tablet (5 mg total) by mouth 2 (two) times daily for 30 days. 12/22/18 01/21/19   Sabas SousBero, Michael M, MD  gabapentin (NEURONTIN) 300 MG capsule Take 600 mg by mouth 3 (three) times daily.    [provider]  hydrochlorothiazide (HYDRODIURIL) 25 MG tablet Take 25 mg by mouth daily. 12/10/18   [provider]  losartan (COZAAR) 100 MG tablet Take 100 mg by mouth daily. 12/10/18   [provider]  omeprazole (PRILOSEC) 40 MG capsule Take 40 mg by mouth daily.    [provider]  oxyCODONE-acetaminophen (PERCOCET/ROXICET) 5-325 MG per tablet Take 1-2 tablets by mouth every 4 (four) hours as needed for moderate pain. 01/10/15   Donalee Citrinram, Gary, MD  tamsulosin (FLOMAX) 0.4 MG CAPS capsule Take 0.4 mg by mouth at bedtime.    [provider]    Family History No family history on file.  Social History Social History   Tobacco Use  . Smoking status: Former Smoker    Years: 3.00  . Smokeless tobacco: Never Used  . Tobacco comment: quit smoking in 1980's  Substance Use Topics  . Alcohol use: No  . Drug use: No     Allergies   Patient has no known allergies.   Review of Systems Review of Systems  Constitutional:       Per HPI, otherwise negative  HENT:       Per HPI, otherwise negative  Respiratory:       Per HPI, otherwise negative  Cardiovascular:       Per HPI, otherwise negative  Gastrointestinal: Negative for vomiting.  Endocrine:       Negative aside from  HPI  Genitourinary:       Neg aside from HPI   Musculoskeletal:       Per HPI, otherwise negative  Skin: Negative.   Neurological: Negative for syncope.     Physical Exam Updated Vital Signs BP 120/67   Pulse (!) 123   Temp 99.2 F (37.3 C) (Oral)   Resp 18   Ht 5\' 9"  (1.753 m)   Wt 81.6 kg   SpO2 100%   BMI 26.58 kg/m   Physical Exam Vitals signs and nursing note reviewed.  Constitutional:      General: He is not in acute distress.    Appearance: He is well-developed.  HENT:     Head: Normocephalic and atraumatic.  Eyes:     Conjunctiva/sclera:  Conjunctivae normal.  Cardiovascular:     Rate and Rhythm: Tachycardia present. Rhythm irregular.  Pulmonary:     Effort: Pulmonary effort is normal. No respiratory distress.     Breath sounds: No stridor.  Abdominal:     General: There is no distension.  Skin:    General: Skin is warm and dry.       Neurological:     Mental Status: He is alert and oriented to person, place, and time.     Motor: Tremor present.      ED Treatments / Results  Labs (all labs ordered are listed, but only abnormal results are displayed) Labs Reviewed  BASIC METABOLIC PANEL - Abnormal; Notable for the following components:      Result Value   Sodium 125 (*)    Potassium 3.2 (*)    Chloride 89 (*)    CO2 21 (*)    Glucose, Bld 124 (*)    BUN <5 (*)    All other components within normal limits  MAGNESIUM - Abnormal; Notable for the following components:   Magnesium 1.6 (*)    All other components within normal limits  CBC - Abnormal; Notable for the following components:   WBC 14.4 (*)    RBC 3.79 (*)    MCV 104.0 (*)    MCH 36.4 (*)    All other components within normal limits  SARS CORONAVIRUS 2 (HOSPITAL ORDER, PERFORMED IN  HOSPITAL LAB)  TSH    EKG EKG Interpretation  Date/Time:  Tuesday May 25 2019 11:27:15 EDT Ventricular Rate:  152 PR Interval:    QRS Duration: 83 QT Interval:  304 QTC Calculation: 484 R Axis:   64 Text Interpretation:  Atrial fibrillation with rapid ventricular response Baseline wander Abnormal ECG Confirmed by Gerhard MunchLockwood, Maxson Oddo [phone]([id number]) on 05/25/2019 11:41:52 AM Also confirmed by Gerhard MunchLockwood, Lorain Fettes ([id number]), editor Barbette HairCassel, Kerry [phone]([id number])  on 05/25/2019 12:27:37 PM   Radiology Dg Chest Port 1 View  Result Date: 05/25/2019 CLINICAL DATA:  Chest pain today. EXAM: PORTABLE CHEST 1 VIEW COMPARISON:  None. FINDINGS: Lungs clear. Heart size normal. No pneumothorax or pleural fluid. No acute or focal bony abnormality. Defibrillator pad in place. IMPRESSION:  Negative chest. Electronically Signed   By: Drusilla Kannerhomas  Dalessio M.D.   On: 05/25/2019 14:22    Procedures .Cardioversion  Date/Time: 05/25/2019 1:29 PM Performed by: Gerhard MunchLockwood, Deloros Beretta, MD Authorized by: Gerhard MunchLockwood, Cliffard Hair, MD   Consent:    Consent obtained:  Verbal and written   Consent given by:  Patient   Risks discussed:  Induced arrhythmia and pain   Alternatives discussed:  No treatment and alternative treatment Universal protocol:    Procedure explained and questions answered to patient or proxy's  satisfaction: yes     Relevant documents present and verified: yes     Test results available and properly labeled: yes     Immediately prior to procedure a time out was called: yes     Patient identity confirmed:  Verbally with patient Pre-procedure details:    Cardioversion basis:  Emergent   Rhythm:  Atrial fibrillation   Electrode placement:  Anterior-posterior Patient sedated: Yes. Refer to sedation procedure documentation for details of sedation.  Attempt one:    Cardioversion mode:  Synchronous   Waveform:  Biphasic   Shock (Joules):  200   Shock outcome:  No change in rhythm Attempt two:    Cardioversion mode:  Synchronous   Waveform:  Biphasic   Shock (Joules):  200   Shock outcome:  No change in rhythm Post-procedure details:    Patient status:  Awake   Patient tolerance of procedure:  Tolerated well, no immediate complications  .Sedation  Date/Time: 05/25/2019 3:32 PM Performed by: Gerhard MunchLockwood, Kanitra Purifoy, MD Authorized by: Gerhard MunchLockwood, Marcea Rojek, MD   Consent:    Consent obtained:  Written   Consent given by:  Patient   Risks discussed:  Dysrhythmia and inadequate sedation   Alternatives discussed:  Anxiolysis Universal protocol:    Procedure explained and questions answered to patient or proxy's satisfaction: yes     Immediately prior to procedure a time out was called: yes     Patient identity confirmation method:  Verbally with patient Indications:    Procedure  performed:  Cardioversion   Procedure necessitating sedation performed by:  Physician performing sedation Pre-sedation assessment:    Time since last food or drink:  3   ASA classification: class 3 - patient with severe systemic disease     Neck mobility: normal     Mouth opening:  3 or more finger widths   Thyromental distance:  3 finger widths   Mallampati score:  II - soft palate, uvula, fauces visible   Pre-sedation assessments completed and reviewed: airway patency     Pre-sedation assessment completed:  05/25/2019 1:00 PM Immediate pre-procedure details:    Reassessment: Patient reassessed immediately prior to procedure     Reviewed: vital signs     Verified: bag valve mask available, emergency equipment available, intubation equipment available, IV patency confirmed, oxygen available and suction available   Procedure details (see MAR for exact dosages):    Preoxygenation:  Nasal cannula   Sedation:  Etomidate   Analgesia:  Fentanyl   Intra-procedure monitoring:  Blood pressure monitoring, cardiac monitor, continuous pulse oximetry, continuous capnometry, frequent LOC assessments and frequent vital sign checks   Intra-procedure events: none     Total Provider sedation time (minutes):  20 Post-procedure details:    Post-sedation assessment completed:  05/25/2019 3:00 PM   Attendance: Constant attendance by certified staff until patient recovered     Recovery: Patient returned to pre-procedure baseline     Post-sedation assessments completed and reviewed: airway patency, cardiovascular function, hydration status, mental status, nausea/vomiting, pain level, respiratory function and temperature     Patient is stable for discharge or admission: yes     Patient tolerance:  Tolerated well, no immediate complications   (including critical care time)  CRITICAL CARE Performed by: Gerhard Munchobert Takashi Korol Total critical care time: 35 minutes Critical care time was exclusive of separately billable  procedures and treating other patients. Critical care was necessary to treat or prevent imminent or life-threatening deterioration. Critical care was time spent personally by me on the  following activities: development of treatment plan with patient and/or surrogate as well as nursing, discussions with consultants, evaluation of patient's response to treatment, examination of patient, obtaining history from patient or surrogate, ordering and performing treatments and interventions, ordering and review of laboratory studies, ordering and review of radiographic studies, pulse oximetry and re-evaluation of patient's condition.   Medications Ordered in ED Medications  etomidate (AMIDATE) injection 10 mg ( Intravenous Canceled Entry 05/25/19 1322)  fentaNYL (SUBLIMAZE) injection 50 mcg ( Intravenous Canceled Entry 05/25/19 1322)  diltiazem (CARDIZEM) 1 mg/mL load via infusion 20 mg (20 mg Intravenous Bolus from Bag 05/25/19 1420)    And  diltiazem (CARDIZEM) 100 mg in dextrose 5% 112mL (1 mg/mL) infusion (5 mg/hr Intravenous New Bag/Given 05/25/19 1419)  magnesium sulfate IVPB 1 g 100 mL (1 g Intravenous New Bag/Given 05/25/19 1519)  fentaNYL (SUBLIMAZE) injection (50 mcg Intravenous Given 05/25/19 1319)  etomidate (AMIDATE) injection (10 mg Intravenous Given 05/25/19 1320)     Initial Impression / Assessment and Plan / ED Course  I have reviewed the triage vital signs and the nursing notes.  Pertinent labs & imaging results that were available during my care of the patient were reviewed by me and considered in my medical decision making (see chart for details).    This patients CHA2DS2-VASc Score and unadjusted Ischemic Stroke Rate (% per year) is equal to 4.8 % stroke rate/year from a score of 4  Above score calculated as 1 point each if present [CHF, HTN, DM, Vascular=MI/PAD/Aortic Plaque, Age if 65-74, or Male] Above score calculated as 2 points each if present [Age > 75, or Stroke/TIA/TE]   Initial labs generally reassuring aside from mild hyponatremia, mild hypomagnesemia. Patient will receive repletion.   Patient is awake, alert. With history of prior successful cardioversion, this will be attempted.   Following conscious sedation with cardioversion, x2, patient remains in atrial fibrillation with rapid ventricular response. Procedure otherwise well-tolerated.  Update: Patient in similar condition, tachycardic, though slowed after initiation of Cardizem which was started after the patient had a unsuccessful cardioversion.  This adult male with a history of atrial fibrillation, appropriately anticoagulated presents with A. fib, palpitations, pain. Here patient had attempted cardioversion without success, required initiation of Cardizem, bolus, drip for persistent A. fib. Though he does have some pain, low suspicion for substantial ongoing ischemia, no evidence for other concurrent pathology including pneumonia. Patient require admission to our cardiology colleagues for further monitoring, management.  Final Clinical Impressions(s) / ED Diagnoses   Final diagnoses:  Atrial fibrillation with RVR [facility])    ED Discharge Orders         Ordered    Amb referral to AFIB Clinic     05/25/19 1155           Carmin Muskrat, MD 05/25/19 1534

## 2019-05-26 ENCOUNTER — Observation Stay (HOSPITAL_BASED_OUTPATIENT_CLINIC_OR_DEPARTMENT_OTHER): Payer: Medicaid Other

## 2019-05-26 ENCOUNTER — Encounter (HOSPITAL_COMMUNITY): Payer: Self-pay | Admitting: Medical

## 2019-05-26 ENCOUNTER — Other Ambulatory Visit: Payer: Self-pay | Admitting: Medical

## 2019-05-26 DIAGNOSIS — I371 Nonrheumatic pulmonary valve insufficiency: Secondary | ICD-10-CM

## 2019-05-26 DIAGNOSIS — E876 Hypokalemia: Secondary | ICD-10-CM

## 2019-05-26 DIAGNOSIS — I4891 Unspecified atrial fibrillation: Secondary | ICD-10-CM

## 2019-05-26 DIAGNOSIS — I10 Essential (primary) hypertension: Secondary | ICD-10-CM

## 2019-05-26 DIAGNOSIS — Z8673 Personal history of transient ischemic attack (TIA), and cerebral infarction without residual deficits: Secondary | ICD-10-CM | POA: Insufficient documentation

## 2019-05-26 DIAGNOSIS — I361 Nonrheumatic tricuspid (valve) insufficiency: Secondary | ICD-10-CM | POA: Diagnosis not present

## 2019-05-26 HISTORY — DX: Personal history of transient ischemic attack (TIA), and cerebral infarction without residual deficits: Z86.73

## 2019-05-26 HISTORY — DX: Hypomagnesemia: E83.42

## 2019-05-26 LAB — BASIC METABOLIC PANEL
Anion gap: 8 (ref 5–15)
BUN: 6 mg/dL — ABNORMAL LOW (ref 8–23)
CO2: 24 mmol/L (ref 22–32)
Calcium: 8.4 mg/dL — ABNORMAL LOW (ref 8.9–10.3)
Chloride: 92 mmol/L — ABNORMAL LOW (ref 98–111)
Creatinine, Ser: 0.91 mg/dL (ref 0.61–1.24)
GFR calc Af Amer: >60 mL/min (ref >60)
GFR calc non Af Amer: >60 mL/min (ref >60)
Glucose, Bld: 110 mg/dL — ABNORMAL HIGH (ref 70–99)
Potassium: 2.7 mmol/L — CL (ref 3.5–5.1)
Sodium: 124 mmol/L — ABNORMAL LOW (ref 135–145)

## 2019-05-26 LAB — TSH: TSH: 1.293 u[IU]/mL (ref 0.350–4.500)

## 2019-05-26 LAB — CBC
HCT: 31.6 % — ABNORMAL LOW (ref 39.0–52.0)
Hemoglobin: 11.3 g/dL — ABNORMAL LOW (ref 13.0–17.0)
MCH: 36.9 pg — ABNORMAL HIGH (ref 26.0–34.0)
MCHC: 35.8 g/dL (ref 30.0–36.0)
MCV: 103.3 fL — ABNORMAL HIGH (ref 80.0–100.0)
Platelets: 154 10*3/uL (ref 150–400)
RBC: 3.06 MIL/uL — ABNORMAL LOW (ref 4.22–5.81)
RDW: 14 % (ref 11.5–15.5)
WBC: 8.6 10*3/uL (ref 4.0–10.5)
nRBC: 0.2 % (ref 0.0–0.2)

## 2019-05-26 LAB — ECHOCARDIOGRAM COMPLETE
Height: 69 in
Weight: 2719.59 oz

## 2019-05-26 LAB — HIV ANTIBODY (ROUTINE TESTING W REFLEX): HIV Screen 4th Generation wRfx: NONREACTIVE

## 2019-05-26 LAB — MAGNESIUM: Magnesium: 1.8 mg/dL (ref 1.7–2.4)

## 2019-05-26 LAB — POTASSIUM: Potassium: 3.8 mmol/L (ref 3.5–5.1)

## 2019-05-26 MED ORDER — POTASSIUM CHLORIDE CRYS ER 20 MEQ PO TBCR
40.0000 meq | EXTENDED_RELEASE_TABLET | Freq: Once | ORAL | Status: AC
  Administered 2019-05-26: 40 meq via ORAL
  Filled 2019-05-26: qty 2

## 2019-05-26 MED ORDER — DILTIAZEM HCL 60 MG PO TABS
120.0000 mg | ORAL_TABLET | Freq: Every day | ORAL | Status: DC
  Administered 2019-05-26: 120 mg via ORAL
  Filled 2019-05-26: qty 2

## 2019-05-26 MED ORDER — POTASSIUM CHLORIDE CRYS ER 20 MEQ PO TBCR
40.0000 meq | EXTENDED_RELEASE_TABLET | Freq: Once | ORAL | Status: AC
  Administered 2019-05-26: 05:00:00 40 meq via ORAL
  Filled 2019-05-26: qty 2

## 2019-05-26 MED ORDER — POTASSIUM CHLORIDE ER 20 MEQ PO TBCR: 40.0000 meq | EXTENDED_RELEASE_TABLET | Freq: Every day | ORAL | 0 refills | Status: DC

## 2019-05-26 MED ORDER — DILTIAZEM HCL 120 MG PO TABS: 120.0000 mg | ORAL_TABLET | Freq: Every day | ORAL | 0 refills | Status: DC

## 2019-05-26 NOTE — Discharge Instructions (Signed)

## 2019-05-26 NOTE — Plan of Care (Signed)

## 2019-05-26 NOTE — Progress Notes (Signed)
  Echocardiogram 2D Echocardiogram has been performed.  Jeffrey Randolph 05/26/2019, 10:02 AM

## 2019-05-26 NOTE — Progress Notes (Addendum)
Progress Note  Patient Name: Jeffrey Randolph Date of Encounter: 05/26/2019  Primary Cardiologist: No primary care provider on file.   Subjective   Patient is feeling good the morning. He denies chest pain or sob. Palpitations improved. Patient is still in aifb.   Inpatient Medications    Scheduled Meds: . apixaban  5 mg Oral BID  . Chlorhexidine Gluconate Cloth  6 each Topical Q0600  . gabapentin  600 mg Oral TID  . losartan  100 mg Oral Daily  . tamsulosin  0.4 mg Oral QHS   Continuous Infusions: . diltiazem (CARDIZEM) infusion 5 mg/hr (05/26/19 0400)   PRN Meds: acetaminophen, ondansetron (ZOFRAN) IV   Vital Signs    Vitals:   05/25/19 1953 05/25/19 2352 05/26/19 0403 05/26/19 0422  BP: (!) 101/54 (!) 100/51 (!) 86/47 (!) 90/51  Pulse: (!) 111 87 93 88  Resp: 19 14 (!) 24 13  Temp: 98.1 F (36.7 C) 98 F (36.7 C) 98.1 F (36.7 C)   TempSrc: Oral Oral Oral   SpO2: 95% 94% 99% 95%  Weight:   77.1 kg   Height:        Intake/Output Summary (Last 24 hours) at 05/26/2019 0743 Last data filed at 05/26/2019 0400 Gross per 24 hour  Intake 308.1 ml  Output 250 ml  Net 58.1 ml   Last 3 Weights 05/26/2019 05/25/2019 12/22/2018  Weight (lbs) 169 lb 15.6 oz 180 lb 177 lb  Weight (kg) 77.1 kg 81.647 kg 80.287 kg      Telemetry    Afib, HR 80-90s, some elevated rates to 130s - Personally Reviewed  ECG    No new - Personally Reviewed  Physical Exam   GEN: No acute distress.   Neck: No JVD Cardiac: Irreg Irreg, no murmurs, rubs, or gallops.  Respiratory: Clear to auscultation bilaterally. GI: Soft, nontender, non-distended  MS: No edema; No deformity. Neuro:  Nonfocal  Psych: Normal affect   Labs    High Sensitivity Troponin:  No results for input(s): TROPONINIHS in the last 720 hours.    Cardiac EnzymesNo results for input(s): TROPONINI in the last 168 hours. No results for input(s): TROPIPOC in the last 168 hours.   Chemistry Recent Labs  Lab 05/25/19  1152 05/26/19 0216  NA 125* 124*  K 3.2* 2.7*  CL 89* 92*  CO2 21* 24  GLUCOSE 124* 110*  BUN <5* 6*  CREATININE 0.62 0.91  CALCIUM 9.0 8.4*  GFRNONAA >60 >60  GFRAA >60 >60  ANIONGAP 15 8     Hematology Recent Labs  Lab 05/25/19 1152 05/26/19 0216  WBC 14.4* 8.6  RBC 3.79* 3.06*  HGB 13.8 11.3*  HCT 39.4 31.6*  MCV 104.0* 103.3*  MCH 36.4* 36.9*  MCHC 35.0 35.8  RDW 13.8 14.0  PLT 205 154    BNPNo results for input(s): BNP, PROBNP in the last 168 hours.   DDimer No results for input(s): DDIMER in the last 168 hours.   Radiology    Dg Chest Port 1 View  Result Date: 05/25/2019 CLINICAL DATA:  Chest pain today. EXAM: PORTABLE CHEST 1 VIEW COMPARISON:  None. FINDINGS: Lungs clear. Heart size normal. No pneumothorax or pleural fluid. No acute or focal bony abnormality. Defibrillator pad in place. IMPRESSION: Negative chest. Electronically Signed   By: Inge Rise M.D.   On: 05/25/2019 14:22    Cardiac Studies   Echo pending  Patient Profile     63 y.o. male with a hx of stroke,  hypertension, and recently diagnosed atrial fibrillation on Eliquis who is being seen today for the evaluation of Afib RVR.  Assessment & Plan    Afib with RVR.  - Patient presented to the ER 8/18 for palpitations x 1 day and found to be in afib RVR Patient consented for cardioversion and patient received 200J shock in the ED. Cardioversion was unsuccessful. Patient placed on IV cardizem - Labs showed Potassium 3.2> 2.7 despite receiving K+ yesterday. Potassium given this AM - Magnesium 1.6, IV mag given yesterday,AM lab pending - Rates are reasonable, 80-90s with some elevated rates to 120 - TSH 1.2 - Eliquis 5 mg BID a/c - Patient on IV Cardizem overnight in hopes patient will convert to NSR. It appears patient is still in afib. Patient can likely be switched to PO Cardizem 120 mg daily today. However, will discuss with MD option of adding IV amio since BPs have been soft.  -  Echo results pending - Patient has appointment with Dr. Mayford Knifeurner scheduled in September - CHADSVASC = 3 (HTN, stroke) This patients CHA2DS2-VASc Score and unadjusted Ischemic Stroke Rate (% per year) is equal to 3.2 % stroke rate/year from a score of 3  Above score calculated as 1 point each if present [CHF, HTN, DM, Vascular=MI/PAD/Aortic Plaque, Age if 65-74, or Male] Above score calculated as 2 points each if present [Age > 75, or Stroke/TIA/TE]  Hypertension - Home meds include Losartan 100 daily and HCTZ 25 mg daily. HCTZ held on admission - BPs soft - Will d/c losartan  - Monitor  Hypomagnesemia - 1.6 on admission - AM Lab pending  Hypokalemia - 3.2 > 2.7 - Levels dropped despite 40 mEq given Yesterday. Patient given 80 mEq this AM  - Recheck BMET   Leukocytosis - WBC improved14.4 > 8.6  H/o of Stroke - left sided residual weakness  For questions or updates, please contact [facility] Please consult [email or web address] for contact info under        Signed, Cadence David StallH Furth, PA-C  05/26/2019, 7:43 AM    I have personally seen and examined this patient with Cadence Fransico MichaelFurth, PA-C. I agree with the assessment and plan as outlined above. He is in rate controlled atrial fib this am. Failed cardioversion yesterday. Will convert Cardizem to po today (Cardizem CD 120 mg daily). Continue Eliquis. Echo today to assess LV systolic function. If LV function is normal and he remains rate controlled, will d/c home later today. Will d/c Cozaar. We will restart HCTZ prior to discharge. He will also need to be started on daily Kdur 40 meq at time of discharge.   Verne CarrowChristopher Eliyanah Elgersma 05/26/2019 8:45 AM

## 2019-05-26 NOTE — Progress Notes (Signed)
Pt on Cardizem drip with low blood pressures and a critical potassium of 2.7. Pt asymptomatic. Paged on call MD regarding Cardizem drip parameters and critical lab. MD stated to keep patient on Cardizem drip and gave a verbal order for potassium replacement. Will continue to monitor.  Tressie Ellis, RN

## 2019-05-26 NOTE — Discharge Summary (Signed)
Discharge Summary    Patient ID: Jeffrey Randolph MRN: [id number]; DOB: [birth year]  Admit date: 05/25/2019 Discharge date: 05/26/2019  Primary Care Provider: Cyndi Bender, PA-C  Primary Cardiologist: Fransico Him, MD  Primary Electrophysiologist:  None   Discharge Diagnoses    Principal Problem:   Atrial fibrillation with RVR [facility]) Active Problems:   Hypertension   Hypokalemia   Hypomagnesemia   Allergies No Known Allergies  Diagnostic Studies/Procedures    Echo 05/26/19  1. The left ventricle has normal systolic function, with an ejection fraction of 55-60%. The cavity size was normal. Left ventricular diastolic function could not be evaluated.  2. The right ventricle has normal systolic function. The cavity was normal. There is no increase in right ventricular wall thickness.  3. Left atrial size was mildly dilated.  4. Mild thickening of the mitral valve leaflet.  5. The aortic valve was not well visualized. Sclerosis without any evidence of stenosis of the aortic valve.  6. The aorta is normal unless otherwise noted. _____________   History of Present Illness     Jeffrey Randolph is a 63 y.o. male with a hx of stroke, hypertension, and recently diagnosed atrial fibrillation on Eliquis. On 3/17 patient presented to the ED for dizziness and pre-syncope and was found to be in new onset afib RVR, rates 130-180. Patient was successfully cardioverted and discharged on Eliquis 5 mg twice daily. He was referred to the Afib clinic but never followed-up. Patient returned to the ER in June 2020 for generalized weakness and headache for the past 3 days. In the ED EKG showed afib with RVR, 128 bpm. CT head showed no signs of acute changes. Patient was converted to NSR and discharged.   On 8/17 patient noticed acute onset palpitations started at around 2 pm while he was sitting down. Patient had no other associated symptoms. Palpitations were constant and nothing seemed to make them better or  worse. Earlier that morning patient felt normal and had been doing light work around the house. He denied chest pain, shortness of breath, or abdominal pain. No fevers or cough. Denied N/V. EMS was called and reported elevated rates 150-200 bpm. 5mg  Metoprolol was given with improvement to 100-150 bpm. 324 ASA was also given. Patient was taken to the ER.   Hospital Course     Consultants: None   In the ER BP 161/134, 119 bpm, Temp 99, Resp 20, O2 98%. Potassium 3.2, Glucose 124, Creatinine 0.62, WBC 14.4, Hgb 13.8. Magnesium was found to be low, 1.6 and he was palced on IV mag. EKG showed Afib RVR, 152 bpm. Patient says he has not missed a dose of Eliquis. Patient was consented for cardioversion and was shocked with 200J twice. Patient did not convert to NSR. Patient was placed on IV cardizem. Rates slowly improved. Patient was admitted by cardiology for observation. Patient was given 40 mEq of pottasium. HCTZ was held on admission. Echo was ordered.   Patient did not have any problems overnight. He remained in Afib with rates 80-90s. Patient was switched to PO Cardizem 120 mg daily. BPs were noted to be soft and Losartan was discontinued. Morning labs showed critical level of potassium, 2.7, and he was given 40 mEq. He was given another 40 mEq more potasium later that morning and repeat potassium lab was ordered. Echo showed 55-60% and mildly dilated left atrium. TSH level 1.2. Repeat CBC showed improved WBC, 8.6. Repeat mag was 1.8. Repeat Potasium was 3.8. Plan to continue Cardizem 150  mg daily, HCTZ, and Eliquis on discharge. Losartan will be discontinued. Will be started on Kdur 40 meq at discharge as well with plan for follow-up labs next week.   Patient was seen and examined by Dr. Clifton JamesMcAlhany on 05/26/19 and felt to be stable for discharge.  _____________  Discharge Vitals Blood pressure 100/75, pulse (!) 110, temperature 99 F (37.2 C), temperature source Oral, resp. rate 19, height 5\' 9"  (1.753  m), weight 77.1 kg, SpO2 96 %.  Filed Weights   05/25/19 1120 05/26/19 0403  Weight: 81.6 kg 77.1 kg    Labs & Radiologic Studies    CBC Recent Labs    05/25/19 1152 05/26/19 0216  WBC 14.4* 8.6  HGB 13.8 11.3*  HCT 39.4 31.6*  MCV 104.0* 103.3*  PLT 205 154   Basic Metabolic Panel Recent Labs    16/07/9607/18/20 1152 05/26/19 0216  NA 125* 124*  K 3.2* 2.7*  CL 89* 92*  CO2 21* 24  GLUCOSE 124* 110*  BUN <5* 6*  CREATININE 0.62 0.91  CALCIUM 9.0 8.4*  MG 1.6* 1.8   Liver Function Tests No results for input(s): AST, ALT, ALKPHOS, BILITOT, PROT, ALBUMIN in the last 72 hours. No results for input(s): LIPASE, AMYLASE in the last 72 hours. Cardiac Enzymes No results for input(s): CKTOTAL, CKMB, CKMBINDEX, TROPONINI in the last 72 hours. BNP Invalid input(s): POCBNP D-Dimer No results for input(s): DDIMER in the last 72 hours. Hemoglobin A1C No results for input(s): HGBA1C in the last 72 hours. Fasting Lipid Panel No results for input(s): CHOL, HDL, LDLCALC, TRIG, CHOLHDL, LDLDIRECT in the last 72 hours. Thyroid Function Tests Recent Labs    05/26/19 0216  TSH 1.293   _____________  Dg Chest Port 1 View  Result Date: 05/25/2019 CLINICAL DATA:  Chest pain today. EXAM: PORTABLE CHEST 1 VIEW COMPARISON:  None. FINDINGS: Lungs clear. Heart size normal. No pneumothorax or pleural fluid. No acute or focal bony abnormality. Defibrillator pad in place. IMPRESSION: Negative chest. Electronically Signed   By: Drusilla Kannerhomas  Dalessio M.D.   On: 05/25/2019 14:22   Disposition   Pt is being discharged home today in good condition.  Follow-up Plans & Appointments    Follow-up Information    Quintella Reicherturner, Traci R, MD Follow up on 06/15/2019.   Specialty: Cardiology Why: Appointment time 8:40 AM Contact information: [address] [place] [address] [phone]          Discharge Instructions    Amb referral to AFIB Clinic   Complete by: As directed        Discharge Medications   Allergies as of 05/26/2019   No Known Allergies     Medication List    STOP taking these medications   losartan 100 MG tablet Commonly known as: COZAAR   oxyCODONE-acetaminophen 5-325 MG tablet Commonly known as: PERCOCET/ROXICET     TAKE these medications   amitriptyline 25 MG tablet Commonly known as: ELAVIL Take 25 mg by mouth daily.   apixaban 5 MG Tabs tablet Commonly known as: ELIQUIS Take 1 tablet (5 mg total) by mouth 2 (two) times daily for 30 days.   diltiazem 120 MG tablet Commonly known as: CARDIZEM Take 1 tablet (120 mg total) by mouth daily. Start taking on: May 27, 2019   gabapentin 300 MG capsule Commonly known as: NEURONTIN Take 600 mg by mouth 3 (three) times daily.   hydrochlorothiazide 25 MG tablet Commonly known as: HYDRODIURIL Take 25 mg by mouth daily.  omeprazole 40 MG capsule Commonly known as: PRILOSEC Take 40 mg by mouth daily.   Potassium Chloride ER 20 MEQ Tbcr Take 40 mEq by mouth daily.   tamsulosin 0.4 MG Caps capsule Commonly known as: FLOMAX Take 0.4 mg by mouth at bedtime.        Acute coronary syndrome (MI, NSTEMI, STEMI, etc) this admission?: No.    Outstanding Labs/Studies   BMET  Duration of Discharge Encounter   Greater than 30 minutes including physician time.  Signed, Tekila Caillouet David StallH Jerrard Bradburn, PA-C 05/26/2019, 2:43 PM

## 2019-05-31 ENCOUNTER — Other Ambulatory Visit: Payer: Self-pay

## 2019-05-31 ENCOUNTER — Other Ambulatory Visit: Payer: Medicaid Other | Admitting: *Deleted

## 2019-05-31 DIAGNOSIS — E876 Hypokalemia: Secondary | ICD-10-CM

## 2019-05-31 LAB — BASIC METABOLIC PANEL
BUN/Creatinine Ratio: 7 — ABNORMAL LOW (ref 10–24)
BUN: 5 mg/dL — ABNORMAL LOW (ref 8–27)
CO2: 18 mmol/L — ABNORMAL LOW (ref 20–29)
Calcium: 8.8 mg/dL (ref 8.6–10.2)
Chloride: 96 mmol/L (ref 96–106)
Creatinine, Ser: 0.71 mg/dL — ABNORMAL LOW (ref 0.76–1.27)
GFR calc Af Amer: 115 mL/min/{1.73_m2} (ref >59)
GFR calc non Af Amer: 100 mL/min/{1.73_m2} (ref >59)
Glucose: 135 mg/dL — ABNORMAL HIGH (ref 65–99)
Potassium: 3.8 mmol/L (ref 3.5–5.2)
Sodium: 131 mmol/L — ABNORMAL LOW (ref 134–144)

## 2019-06-13 NOTE — Progress Notes (Signed)
Cardiology Office Note    Date:  06/15/2019   ID:  Jeffrey Randolph, DOB 1955/10/18, MRN [id number]  PCP:  Lonie Peakonroy, Nathan, PA-C  Cardiologist:  Armanda Magicraci Korine Winton, MD   Chief Complaint  Patient presents with  . Atrial Fibrillation    History of Present Illness:  Jeffrey Randolph is a [age] y.o. male who is being seen today for the evaluation of new onset atrial fibrillation at the request of Lonie PeakConroy, Nathan, [place].  This is a 63yo male with a hx of CVA, HTN and PAF dx 12/2018.  In March he underwent DCCV to NSR and has been on Eliquis since then.  He was referred to afib clinic but never followed up.  Represented to ER in June with recurrent afib with RVR and converted to NSR and was sent home.  Readmitted again with palpitations with no other sx and called EMS where he was found to be back in afib with RVr at 150-200pmb.  In ER was hypertensive to 161/18534mmHg with elevated WBC to 14.4 and low mag at 1.6.  Underwent DCCV x 2 but failed to to convert to NSR and was started on Iv Cardizem gtt and later transitioned to PO.   He is here today for followup and is doing well.  He denies any chest pain or pressure, SOB, DOE, PND, orthopnea, LE edema, dizziness, palpitations or syncope. He is compliant with his meds and is tolerating meds with no SE.    Past Medical History:  Diagnosis Date  . Atrial fibrillation with rapid ventricular response (HCC)   . BPH (benign prostatic hyperplasia)   . CVA (cerebral infarction)   . GERD (gastroesophageal reflux disease)   . History of stroke 05/26/2019  . HNP (herniated nucleus pulposus), lumbar 01/09/2015  . Hypertension   . Hypokalemia   . Hypomagnesemia 05/26/2019  . Stroke [facility](HCC)    Left foot always feel 2 x larger, walks with a limp.    Past Surgical History:  Procedure Laterality Date  . LUMBAR LAMINECTOMY/DECOMPRESSION MICRODISCECTOMY Right 01/09/2015   Procedure: LUMBAR LAMINECTOMY/DECOMPRESSION MICRODISCECTOMY 1 LEVEL;  Surgeon: Donalee CitrinGary Cram, MD;  Location: [facility];  Service: Neurosurgery;  Laterality: Right;  LUMBAR LAMINECTOMY/DECOMPRESSION MICRODISCECTOMY 1 LEVEL RIGHT LUMBAR 2-3  . TONSILLECTOMY      Current Medications: Current Meds  Medication Sig  . apixaban (ELIQUIS) 5 MG TABS tablet Take 1 tablet (5 mg total) by mouth 2 (two) times daily for 30 days.  Marland Kitchen. diltiazem (CARDIZEM) 120 MG tablet Take 1 tablet (120 mg total) by mouth daily.  Marland Kitchen. omeprazole (PRILOSEC) 40 MG capsule Take 40 mg by mouth daily.  . potassium chloride 20 MEQ TBCR Take 40 mEq by mouth daily.    Allergies:   Patient has no known allergies.   Social History   Socioeconomic History  . Marital status: Single    Spouse name: Not on file  . Number of children: Not on file  . Years of education: Not on file  . Highest education level: Not on file  Occupational History  . Not on file  Social Needs  . Financial resource strain: Not on file  . Food insecurity    Worry: Not on file    Inability: Not on file  . Transportation needs    Medical: Not on file    Non-medical: Not on file  Tobacco Use  . Smoking status: Former Smoker    Years: 3.00  . Smokeless tobacco: Never Used  . Tobacco comment: quit smoking  in 1980's  Substance and Sexual Activity  . Alcohol use: No  . Drug use: No  . Sexual activity: Not on file  Lifestyle  . Physical activity    Days per week: Not on file    Minutes per session: Not on file  . Stress: Not on file  Relationships  . Social Musician on phone: Not on file    Gets together: Not on file    Attends religious service: Not on file    Active member of club or organization: Not on file    Attends meetings of clubs or organizations: Not on file    Relationship status: Not on file  Other Topics Concern  . Not on file  Social History Narrative  . Not on file     Family History:  The patient's family history is not on file.   ROS:   Please see the history of present illness.    ROS All other systems reviewed and  are negative.  No flowsheet data found.   PHYSICAL EXAM:   VS:  BP (!) 156/54   Pulse (!) 102   Ht 5\' 9"  (1.753 m)   Wt 173 lb 3.2 oz (78.6 kg)   SpO2 98%   BMI 25.58 kg/m    GEN: Well nourished, well developed, in no acute distress  HEENT: normal  Neck: no JVD, carotid bruits, or masses Cardiac: RRR; no murmurs, rubs, or gallops,no edema.  Intact distal pulses bilaterally.  Respiratory:  clear to auscultation bilaterally, normal work of breathing GI: soft, nontender, nondistended, + BS MS: no deformity or atrophy  Skin: warm and dry, no rash Neuro:  Alert and Oriented x 3, Strength and sensation are intact Psych: euthymic mood, full affect  Wt Readings from Last 3 Encounters:  06/15/19 173 lb 3.2 oz (78.6 kg)  05/26/19 169 lb 15.6 oz (77.1 kg)  12/22/18 177 lb (80.3 kg)      Studies/Labs Reviewed:   EKG:  EKG is not ordered today.   Recent Labs: 05/26/2019: Hemoglobin 11.3; Magnesium 1.8; Platelets 154; TSH 1.293 05/31/2019: BUN 5; Creatinine, Ser 0.71; Potassium 3.8; Sodium 131   Lipid Panel No results found for: CHOL, TRIG, HDL, CHOLHDL, VLDL, LDLCALC, LDLDIRECT  Additional studies/ records that were reviewed today include:  Hospital notes, 2D ehco    ASSESSMENT:    1. Paroxysmal atrial fibrillation (HCC)   2. Essential hypertension   3. Hypokalemia      PLAN:  In order of problems listed above:  1.  PAF - he has had several reoccurrences of afib with RVR, the last being last month in the setting of electrolyte abnormalities.  He has not had any further palpitations.  His HR is elevated today.  Increase  Cardizem CD 180mg  daily and apixaban 5mg  BID.  I will refer him back to afib clinic.  2.  HTN - BP is borderline controlled on exam.  Continue on HCTZ 25mg  daily and increase Cardizem CD to 180mg  daily.  Losartan stopped in hospital due to soft BP.   3  Hypokalemia - noted at last admit with afib as well as low Mag.  Will repeat BMET and Mag.     Medication Adjustments/Labs and Tests Ordered: Current medicines are reviewed at length with the patient today.  Concerns regarding medicines are outlined above.  Medication changes, Labs and Tests ordered today are listed in the Patient Instructions below.  There are no Patient Instructions on file for this  visit.   Signed, Fransico Him, MD  06/15/2019 8:37 AM    [address], [place], [place]  [address] Phone: [phone]; Fax: [phone]

## 2019-06-15 ENCOUNTER — Ambulatory Visit (INDEPENDENT_AMBULATORY_CARE_PROVIDER_SITE_OTHER): Payer: Medicaid Other | Admitting: Cardiology

## 2019-06-15 ENCOUNTER — Encounter: Payer: Self-pay | Admitting: Cardiology

## 2019-06-15 ENCOUNTER — Other Ambulatory Visit: Payer: Self-pay

## 2019-06-15 VITALS — BP 156/54 | HR 102 | Ht 69.0 in | Wt 173.2 lb

## 2019-06-15 DIAGNOSIS — I480 Paroxysmal atrial fibrillation: Secondary | ICD-10-CM | POA: Diagnosis not present

## 2019-06-15 DIAGNOSIS — E876 Hypokalemia: Secondary | ICD-10-CM

## 2019-06-15 DIAGNOSIS — I10 Essential (primary) hypertension: Secondary | ICD-10-CM

## 2019-06-15 LAB — BASIC METABOLIC PANEL
BUN/Creatinine Ratio: 5 — ABNORMAL LOW (ref 10–24)
BUN: 3 mg/dL — ABNORMAL LOW (ref 8–27)
CO2: 21 mmol/L (ref 20–29)
Calcium: 8.8 mg/dL (ref 8.6–10.2)
Chloride: 103 mmol/L (ref 96–106)
Creatinine, Ser: 0.65 mg/dL — ABNORMAL LOW (ref 0.76–1.27)
GFR calc Af Amer: 120 mL/min/{1.73_m2} (ref >59)
GFR calc non Af Amer: 104 mL/min/{1.73_m2} (ref >59)
Glucose: 104 mg/dL — ABNORMAL HIGH (ref 65–99)
Potassium: 4.5 mmol/L (ref 3.5–5.2)
Sodium: 137 mmol/L (ref 134–144)

## 2019-06-15 MED ORDER — DILTIAZEM HCL ER COATED BEADS 180 MG PO CP24: 180.0000 mg | ORAL_CAPSULE | Freq: Every day | ORAL | 3 refills | Status: DC

## 2019-06-15 NOTE — Patient Instructions (Signed)
Medication Instructions:  1) INCREASE Diltiazem to 180mg  once daily  If you need a refill on your cardiac medications before your next appointment, please call your pharmacy.   Lab work: BMET today  If you have labs (blood work) drawn today and your tests are completely normal, you will receive your results only by: Marland Kitchen MyChart Message (if you have MyChart) OR . A paper copy in the mail If you have any lab test that is abnormal or we need to change your treatment, we will call you to review the results.  Testing/Procedures: None  Follow-Up:  You have been referred to our Atrial Fibrillation Clinic.   Your physician wants you to follow-up in: 6 months with a PA or NP on our team.  You will receive a reminder letter in the mail two months in advance. If you don't receive a letter, please call our office to schedule the follow-up appointment.  At [facility], you and your health needs are our priority.  As part of our continuing mission to provide you with exceptional heart care, we have created designated Provider Care Teams.  These Care Teams include your primary Cardiologist (physician) and Advanced Practice Providers (APPs -  Physician Assistants and Nurse Practitioners) who all work together to provide you with the care you need, when you need it. You will need a follow up appointment in 12 months.  Please call our office 2 months in advance to schedule this appointment.  You may see Fransico Him, MD or one of the following Advanced Practice Providers on your designated Care Team:   [place], PA-C Melina Copa, PA-C . Ermalinda Barrios, PA-C  Any Other Special Instructions Will Be Listed Below (If Applicable).

## 2019-06-23 ENCOUNTER — Encounter (HOSPITAL_COMMUNITY): Payer: Self-pay | Admitting: Physician Assistant

## 2019-06-23 ENCOUNTER — Ambulatory Visit (HOSPITAL_COMMUNITY)
Admission: RE | Admit: 2019-06-23 | Discharge: 2019-06-23 | Disposition: A | Discharge status: Home / Self Care | Payer: Medicaid Other | Source: Ambulatory Visit | Attending: Physician Assistant | Admitting: Physician Assistant

## 2019-06-23 ENCOUNTER — Other Ambulatory Visit: Payer: Self-pay

## 2019-06-23 VITALS — BP 168/88 | HR 107 | Ht 69.0 in | Wt 170.8 lb

## 2019-06-23 DIAGNOSIS — Z7901 Long term (current) use of anticoagulants: Secondary | ICD-10-CM | POA: Diagnosis not present

## 2019-06-23 DIAGNOSIS — I4891 Unspecified atrial fibrillation: Secondary | ICD-10-CM | POA: Diagnosis present

## 2019-06-23 DIAGNOSIS — Z87891 Personal history of nicotine dependence: Secondary | ICD-10-CM | POA: Diagnosis not present

## 2019-06-23 DIAGNOSIS — I4819 Other persistent atrial fibrillation: Secondary | ICD-10-CM | POA: Diagnosis not present

## 2019-06-23 DIAGNOSIS — K219 Gastro-esophageal reflux disease without esophagitis: Secondary | ICD-10-CM | POA: Diagnosis not present

## 2019-06-23 DIAGNOSIS — Z8673 Personal history of transient ischemic attack (TIA), and cerebral infarction without residual deficits: Secondary | ICD-10-CM | POA: Diagnosis not present

## 2019-06-23 DIAGNOSIS — Z79899 Other long term (current) drug therapy: Secondary | ICD-10-CM | POA: Insufficient documentation

## 2019-06-23 DIAGNOSIS — I10 Essential (primary) hypertension: Secondary | ICD-10-CM | POA: Insufficient documentation

## 2019-06-23 MED ORDER — FLECAINIDE ACETATE 50 MG PO TABS: 50.0000 mg | ORAL_TABLET | Freq: Two times a day (BID) | ORAL | 3 refills | Status: DC

## 2019-06-23 MED ORDER — DILTIAZEM HCL ER COATED BEADS 240 MG PO CP24: 240.0000 mg | ORAL_CAPSULE | Freq: Every day | ORAL | 3 refills | Status: DC

## 2019-06-23 NOTE — Patient Instructions (Signed)
Increase cardizem (diltiazem) to 240mg  once a day at bedtime  Start Flecainide 50mg  twice a day

## 2019-06-23 NOTE — Progress Notes (Signed)
Primary Care Physician: Lonie Peakonroy, Nathan, PA-C Primary Cardiologist: Dr Mayford Knifeurner Primary Electrophysiologist: none Referring Physician: Dr Mayford Knifeurner   Jeffrey Randolph is a 63 y.o. male with a history of CVA, HTN and persistent afib dx 12/2018 who is referred for consultation in the AF Clinic.  In March 2020 he underwent DCCV to NSR and has been on Eliquis since then.  He was referred to afib clinic but never followed up.  Presented again to ER in June with recurrent afib with RVR and converted to NSR and was sent home.  Readmitted again 05/25/19 with palpitations with no other sx and called EMS where he was found to be back in afib with RVr at 150-200pmb. Underwent DCCV x 2 but failed to to convert to NSR and was started on Iv Cardizem gtt and later transitioned to PO. His recent echo showed preserved EF 55-60%, mild LAE. Patient denies any significant alcohol use or snoring. He states that he feels better since increasing the diltiazem but is still a little weak feeling. When his HR becomes rapid he has symptoms of palpitations and presyncope. He remains in afib today.   Today, he denies symptoms of chest pain, shortness of breath, orthopnea, PND, lower extremity edema, dizziness, syncope, snoring, daytime somnolence, bleeding, or neurologic sequela. The patient is tolerating medications without difficulties and is otherwise without complaint today.    Atrial Fibrillation Risk Factors:  he does not have symptoms or diagnosis of sleep apnea. he does not have a history of rheumatic fever. he does not have a history of alcohol use. The patient does not have a history of early familial atrial fibrillation or other arrhythmias.  he has a BMI of Body mass index is 25.22 kg/m.Marland Kitchen. Filed Weights   06/23/19 0854  Weight: 77.5 kg    No family history on file.   Atrial Fibrillation Management history:  Previous antiarrhythmic drugs: none Previous cardioversions: 12/2018, 04/05/19, 05/25/19 (unsuccessful)  Previous ablations: none CHADS2VASC score: 3 Anticoagulation history: Eliquis    Past Medical History:  Diagnosis Date  . Atrial fibrillation with rapid ventricular response (HCC)   . BPH (benign prostatic hyperplasia)   . CVA (cerebral infarction)   . GERD (gastroesophageal reflux disease)   . History of stroke 05/26/2019  . HNP (herniated nucleus pulposus), lumbar 01/09/2015  . Hypertension   . Hypokalemia   . Hypomagnesemia 05/26/2019  . Stroke [facility](HCC)    Left foot always feel 2 x larger, walks with a limp.   Past Surgical History:  Procedure Laterality Date  . LUMBAR LAMINECTOMY/DECOMPRESSION MICRODISCECTOMY Right 01/09/2015   Procedure: LUMBAR LAMINECTOMY/DECOMPRESSION MICRODISCECTOMY 1 LEVEL;  Surgeon: Donalee CitrinGary Cram, MD;  Location: [facility];  Service: Neurosurgery;  Laterality: Right;  LUMBAR LAMINECTOMY/DECOMPRESSION MICRODISCECTOMY 1 LEVEL RIGHT LUMBAR 2-3  . TONSILLECTOMY      Current Outpatient Medications  Medication Sig Dispense Refill  . apixaban (ELIQUIS) 5 MG TABS tablet Take 1 tablet (5 mg total) by mouth 2 (two) times daily for 30 days. 60 tablet 0  . diltiazem (CARDIZEM CD) 240 MG 24 hr capsule Take 1 capsule (240 mg total) by mouth daily. 30 capsule 3  . omeprazole (PRILOSEC) 40 MG capsule Take 40 mg by mouth daily.    . potassium chloride 20 MEQ TBCR Take 40 mEq by mouth daily. 60 tablet 0  . flecainide (TAMBOCOR) 50 MG tablet Take 1 tablet (50 mg total) by mouth 2 (two) times daily. 60 tablet 3   No current facility-administered medications for this encounter.  No Known Allergies  Social History   Socioeconomic History  . Marital status: Single    Spouse name: Not on file  . Number of children: Not on file  . Years of education: Not on file  . Highest education level: Not on file  Occupational History  . Not on file  Social Needs  . Financial resource strain: Not on file  . Food insecurity    Worry: Not on file    Inability: Not on file  .  Transportation needs    Medical: Not on file    Non-medical: Not on file  Tobacco Use  . Smoking status: Former Smoker    Years: 3.00  . Smokeless tobacco: Never Used  . Tobacco comment: quit smoking in 1980's  Substance and Sexual Activity  . Alcohol use: No  . Drug use: No  . Sexual activity: Not on file  Lifestyle  . Physical activity    Days per week: Not on file    Minutes per session: Not on file  . Stress: Not on file  Relationships  . Social Musician on phone: Not on file    Gets together: Not on file    Attends religious service: Not on file    Active member of club or organization: Not on file    Attends meetings of clubs or organizations: Not on file    Relationship status: Not on file  . Intimate partner violence    Fear of current or ex partner: Not on file    Emotionally abused: Not on file    Physically abused: Not on file    Forced sexual activity: Not on file  Other Topics Concern  . Not on file  Social History Narrative  . Not on file     ROS- All systems are reviewed and negative except as per the HPI above.  Physical Exam: Vitals:   06/23/19 0854  BP: (!) 168/88  Pulse: (!) 107  Weight: 77.5 kg  Height: 5\' 9"  (1.753 m)    GEN- The patient is well appearing, alert and oriented x 3 today.   Head- normocephalic, atraumatic Eyes-  Sclera clear, conjunctiva pink Ears- hearing intact Oropharynx- clear Neck- supple  Lungs- Clear to ausculation bilaterally, normal work of breathing Heart- irregular rate and rhythm, no murmurs, rubs or gallops  GI- soft, NT, ND, + BS Extremities- no clubbing, cyanosis, or edema MS- no significant deformity or atrophy Skin- no rash or lesion Psych- euthymic mood, full affect Neuro- strength and sensation are intact  Wt Readings from Last 3 Encounters:  06/23/19 77.5 kg  06/15/19 78.6 kg  05/26/19 77.1 kg    EKG today demonstrates afib HR 107, QRS 78, QTc 459  Echo 05/26/19 demonstrated   1.  The left ventricle has normal systolic function, with an ejection fraction of 55-60%. The cavity size was normal. Left ventricular diastolic function could not be evaluated.  2. The right ventricle has normal systolic function. The cavity was normal. There is no increase in right ventricular wall thickness.  3. Left atrial size was mildly dilated.  4. Mild thickening of the mitral valve leaflet.  5. The aortic valve was not well visualized. Sclerosis without any evidence of stenosis of the aortic valve.  6. The aorta is normal unless otherwise noted.  Epic records are reviewed at length today  Assessment and Plan:  1. Persistent atrial fibrillation General education about afib provided and questions answered. We also discussed his  stroke risk and the risks and benefits of anticoagulation.  We discussed therapeutic options including AAD. After discussing the risks and benefits, will start flecainide 50 mg BID. No h/o CAD. Will plan for TST once SR restored. We discussed that we may need to repeat DCCV on flecainide. Patient agreeable.  Will increase diltiazem to 240 mg daily Continue Eliquis 5 mg BID. Patient reports no missed doses.  This patients CHA2DS2-VASc Score and unadjusted Ischemic Stroke Rate (% per year) is equal to 3.2 % stroke rate/year from a score of 3  Above score calculated as 1 point each if present [CHF, HTN, DM, Vascular=MI/PAD/Aortic Plaque, Age if 65-74, or Male] Above score calculated as 2 points each if present [Age > 75, or Stroke/TIA/TE]   2. HTN Elevated today, med changes as above.   Follow up in the AF clinic in one week.   [facility] [address] [place], [place] [address] [phone] 06/23/2019 9:35 AM

## 2019-07-01 ENCOUNTER — Ambulatory Visit (HOSPITAL_COMMUNITY)
Admission: RE | Admit: 2019-07-01 | Discharge: 2019-07-01 | Disposition: A | Discharge status: Home / Self Care | Payer: Medicaid Other | Source: Ambulatory Visit | Attending: Physician Assistant | Admitting: Physician Assistant

## 2019-07-01 ENCOUNTER — Encounter (HOSPITAL_COMMUNITY): Payer: Self-pay | Admitting: Physician Assistant

## 2019-07-01 ENCOUNTER — Other Ambulatory Visit: Payer: Self-pay

## 2019-07-01 VITALS — BP 160/70 | HR 80 | Ht 69.0 in | Wt 175.4 lb

## 2019-07-01 DIAGNOSIS — Z7901 Long term (current) use of anticoagulants: Secondary | ICD-10-CM | POA: Diagnosis not present

## 2019-07-01 DIAGNOSIS — I10 Essential (primary) hypertension: Secondary | ICD-10-CM | POA: Insufficient documentation

## 2019-07-01 DIAGNOSIS — I4819 Other persistent atrial fibrillation: Secondary | ICD-10-CM | POA: Diagnosis not present

## 2019-07-01 DIAGNOSIS — Z79899 Other long term (current) drug therapy: Secondary | ICD-10-CM | POA: Diagnosis not present

## 2019-07-01 DIAGNOSIS — K219 Gastro-esophageal reflux disease without esophagitis: Secondary | ICD-10-CM | POA: Diagnosis not present

## 2019-07-01 DIAGNOSIS — Z8673 Personal history of transient ischemic attack (TIA), and cerebral infarction without residual deficits: Secondary | ICD-10-CM | POA: Insufficient documentation

## 2019-07-01 DIAGNOSIS — I639 Cerebral infarction, unspecified: Secondary | ICD-10-CM | POA: Diagnosis not present

## 2019-07-01 DIAGNOSIS — E876 Hypokalemia: Secondary | ICD-10-CM | POA: Insufficient documentation

## 2019-07-01 DIAGNOSIS — Z87891 Personal history of nicotine dependence: Secondary | ICD-10-CM | POA: Insufficient documentation

## 2019-07-01 NOTE — Progress Notes (Signed)
Primary Care Physician: Lonie Peakonroy, Nathan, PA-C Primary Cardiologist: Dr Mayford Knifeurner Primary Electrophysiologist: none Referring Physician: Dr Mayford Knifeurner   Jeffrey LyeRandy Phaneuf is a 63 y.o. male with a history of CVA, HTN and persistent afib dx 12/2018 who is referred for consultation in the AF Clinic.  In March 2020 he underwent DCCV to NSR and has been on Eliquis since then.  He was referred to afib clinic but never followed up.  Presented again to ER in June with recurrent afib with RVR and converted to NSR and was sent home.  Readmitted again 05/25/19 with palpitations with no other sx and called EMS where he was found to be back in afib with RVr at 150-200pmb. Underwent DCCV x 2 but failed to to convert to NSR and was started on Iv Cardizem gtt and later transitioned to PO. His recent echo showed preserved EF 55-60%, mild LAE. Patient denies any significant alcohol use or snoring.   On follow up today, patient reports that he has done very well. He has more energy and his legs no longer feel weak. He is in SR today and tolerating the medication well.  Today, he denies symptoms of palpitations, chest pain, shortness of breath, orthopnea, PND, lower extremity edema, dizziness, syncope, snoring, daytime somnolence, bleeding, or neurologic sequela. The patient is tolerating medications without difficulties and is otherwise without complaint today.    Atrial Fibrillation Risk Factors:  he does not have symptoms or diagnosis of sleep apnea. he does not have a history of rheumatic fever. he does not have a history of alcohol use. The patient does not have a history of early familial atrial fibrillation or other arrhythmias.  he has a BMI of Body mass index is 25.9 kg/m.Marland Kitchen. Filed Weights   07/01/19 0908  Weight: 79.6 kg    No family history on file.   Atrial Fibrillation Management history:  Previous antiarrhythmic drugs: flecainide Previous cardioversions: 12/2018, 04/05/19, 05/25/19 (unsuccessful)  Previous ablations: none CHADS2VASC score: 3 Anticoagulation history: Eliquis    Past Medical History:  Diagnosis Date  . Atrial fibrillation with rapid ventricular response (HCC)   . BPH (benign prostatic hyperplasia)   . CVA (cerebral infarction)   . GERD (gastroesophageal reflux disease)   . History of stroke 05/26/2019  . HNP (herniated nucleus pulposus), lumbar 01/09/2015  . Hypertension   . Hypokalemia   . Hypomagnesemia 05/26/2019  . Stroke [facility](HCC)    Left foot always feel 2 x larger, walks with a limp.   Past Surgical History:  Procedure Laterality Date  . LUMBAR LAMINECTOMY/DECOMPRESSION MICRODISCECTOMY Right 01/09/2015   Procedure: LUMBAR LAMINECTOMY/DECOMPRESSION MICRODISCECTOMY 1 LEVEL;  Surgeon: Donalee CitrinGary Cram, MD;  Location: [facility];  Service: Neurosurgery;  Laterality: Right;  LUMBAR LAMINECTOMY/DECOMPRESSION MICRODISCECTOMY 1 LEVEL RIGHT LUMBAR 2-3  . TONSILLECTOMY      Current Outpatient Medications  Medication Sig Dispense Refill  . apixaban (ELIQUIS) 5 MG TABS tablet Take 1 tablet (5 mg total) by mouth 2 (two) times daily for 30 days. 60 tablet 0  . diltiazem (CARDIZEM CD) 240 MG 24 hr capsule Take 1 capsule (240 mg total) by mouth daily. 30 capsule 3  . flecainide (TAMBOCOR) 50 MG tablet Take 1 tablet (50 mg total) by mouth 2 (two) times daily. 60 tablet 3  . omeprazole (PRILOSEC) 40 MG capsule Take 40 mg by mouth daily.    . potassium chloride 20 MEQ TBCR Take 40 mEq by mouth daily. 60 tablet 0   No current facility-administered medications for this  encounter.     No Known Allergies  Social History   Socioeconomic History  . Marital status: Single    Spouse name: Not on file  . Number of children: Not on file  . Years of education: Not on file  . Highest education level: Not on file  Occupational History  . Not on file  Social Needs  . Financial resource strain: Not on file  . Food insecurity    Worry: Not on file    Inability: Not on file  .  Transportation needs    Medical: Not on file    Non-medical: Not on file  Tobacco Use  . Smoking status: Former Smoker    Years: 3.00  . Smokeless tobacco: Never Used  . Tobacco comment: quit smoking in 1980's  Substance and Sexual Activity  . Alcohol use: No  . Drug use: No  . Sexual activity: Not on file  Lifestyle  . Physical activity    Days per week: Not on file    Minutes per session: Not on file  . Stress: Not on file  Relationships  . Social Herbalist on phone: Not on file    Gets together: Not on file    Attends religious service: Not on file    Active member of club or organization: Not on file    Attends meetings of clubs or organizations: Not on file    Relationship status: Not on file  . Intimate partner violence    Fear of current or ex partner: Not on file    Emotionally abused: Not on file    Physically abused: Not on file    Forced sexual activity: Not on file  Other Topics Concern  . Not on file  Social History Narrative  . Not on file     ROS- All systems are reviewed and negative except as per the HPI above.  Physical Exam: Vitals:   07/01/19 0908  BP: (!) 160/70  Pulse: 80  Weight: 79.6 kg  Height: 5\' 9"  (1.753 m)    GEN- The patient is well appearing, alert and oriented x 3 today.   HEENT-head normocephalic, atraumatic, sclera clear, conjunctiva pink, hearing intact, trachea midline. Lungs- Clear to ausculation bilaterally, normal work of breathing Heart- Regular rate and rhythm, no murmurs, rubs or gallops  GI- soft, NT, ND, + BS Extremities- no clubbing, cyanosis, or edema MS- no significant deformity or atrophy Skin- no rash or lesion Psych- euthymic mood, full affect Neuro- strength and sensation are intact   Wt Readings from Last 3 Encounters:  07/01/19 79.6 kg  06/23/19 77.5 kg  06/15/19 78.6 kg    EKG today demonstrates SR HR 80, PR 164, QRS 88, QTc 456  Echo 05/26/19 demonstrated   1. The left ventricle has  normal systolic function, with an ejection fraction of 55-60%. The cavity size was normal. Left ventricular diastolic function could not be evaluated.  2. The right ventricle has normal systolic function. The cavity was normal. There is no increase in right ventricular wall thickness.  3. Left atrial size was mildly dilated.  4. Mild thickening of the mitral valve leaflet.  5. The aortic valve was not well visualized. Sclerosis without any evidence of stenosis of the aortic valve.  6. The aorta is normal unless otherwise noted.  Epic records are reviewed at length today  Assessment and Plan:  1. Persistent atrial fibrillation Patient converted to SR on flecainide. Continue flecainide 50 mg BID.  Will arrange for TST. Continue diltiazem to 240 mg daily Continue Eliquis 5 mg BID. Patient reports no missed doses.  This patients CHA2DS2-VASc Score and unadjusted Ischemic Stroke Rate (% per year) is equal to 3.2 % stroke rate/year from a score of 3  Above score calculated as 1 point each if present [CHF, HTN, DM, Vascular=MI/PAD/Aortic Plaque, Age if 65-74, or Male] Above score calculated as 2 points each if present [Age > 75, or Stroke/TIA/TE]   2. HTN Elevated today, better controlled at home. Continue to monitor, may need to resume Losartan (stopped in hospital 2/2 soft BP).   Follow up in the AF clinic in 3 months.   Jorja Loa PA-C Afib Clinic [facility] [address] [place], [place] [address] [phone] 07/01/2019 9:25 AM

## 2019-07-01 NOTE — Patient Instructions (Signed)
Scheduling will be in contact to set up treadmill testing.

## 2019-07-24 ENCOUNTER — Inpatient Hospital Stay (HOSPITAL_COMMUNITY): Admission: RE | Admit: 2019-07-24 | Payer: Medicaid Other | Source: Ambulatory Visit

## 2019-07-26 ENCOUNTER — Inpatient Hospital Stay (HOSPITAL_COMMUNITY): Admission: RE | Admit: 2019-07-26 | Payer: Medicaid Other | Source: Ambulatory Visit

## 2019-07-28 ENCOUNTER — Encounter (HOSPITAL_COMMUNITY): Payer: Medicaid Other

## 2019-08-16 ENCOUNTER — Other Ambulatory Visit (HOSPITAL_COMMUNITY)
Admission: RE | Admit: 2019-08-16 | Discharge: 2019-08-16 | Disposition: A | Discharge status: Home / Self Care | Payer: Medicaid Other | Source: Ambulatory Visit | Attending: Physician Assistant | Admitting: Physician Assistant

## 2019-08-16 DIAGNOSIS — Z20828 Contact with and (suspected) exposure to other viral communicable diseases: Secondary | ICD-10-CM | POA: Insufficient documentation

## 2019-08-16 DIAGNOSIS — Z01812 Encounter for preprocedural laboratory examination: Secondary | ICD-10-CM | POA: Diagnosis present

## 2019-08-17 ENCOUNTER — Telehealth (HOSPITAL_COMMUNITY): Payer: Self-pay

## 2019-08-17 LAB — NOVEL CORONAVIRUS, NAA (HOSP ORDER, SEND-OUT TO REF LAB; TAT 18-24 HRS): SARS-CoV-2, NAA: NOT DETECTED

## 2019-08-17 NOTE — Telephone Encounter (Signed)
Encounter complete. 

## 2019-08-19 ENCOUNTER — Other Ambulatory Visit: Payer: Self-pay

## 2019-08-19 ENCOUNTER — Ambulatory Visit (HOSPITAL_COMMUNITY)
Admission: RE | Admit: 2019-08-19 | Discharge: 2019-08-19 | Disposition: A | Discharge status: Home / Self Care | Payer: Medicaid Other | Source: Ambulatory Visit | Attending: Cardiovascular Disease | Admitting: Cardiovascular Disease

## 2019-08-19 DIAGNOSIS — I4819 Other persistent atrial fibrillation: Secondary | ICD-10-CM | POA: Diagnosis not present

## 2019-08-19 LAB — EXERCISE TOLERANCE TEST
Estimated workload: 4.6 METS
Exercise duration (min): 3 min
Exercise duration (sec): 0 s
MPHR: 157 {beats}/min
Peak HR: 133 {beats}/min
Percent HR: 84 %
RPE: 18
Rest HR: 102 {beats}/min

## 2019-09-28 ENCOUNTER — Encounter (HOSPITAL_COMMUNITY): Payer: Self-pay

## 2019-09-28 ENCOUNTER — Ambulatory Visit (HOSPITAL_COMMUNITY): Payer: Medicaid Other | Admitting: Physician Assistant

## 2019-10-20 ENCOUNTER — Other Ambulatory Visit: Payer: Self-pay

## 2019-10-20 ENCOUNTER — Ambulatory Visit (HOSPITAL_COMMUNITY)
Admission: RE | Admit: 2019-10-20 | Discharge: 2019-10-20 | Disposition: A | Discharge status: Home / Self Care | Payer: Medicaid Other | Source: Ambulatory Visit | Attending: Physician Assistant | Admitting: Physician Assistant

## 2019-10-20 ENCOUNTER — Encounter (HOSPITAL_COMMUNITY): Payer: Self-pay | Admitting: Physician Assistant

## 2019-10-20 VITALS — BP 140/50 | HR 94 | Ht 69.0 in | Wt 179.2 lb

## 2019-10-20 DIAGNOSIS — K219 Gastro-esophageal reflux disease without esophagitis: Secondary | ICD-10-CM | POA: Diagnosis not present

## 2019-10-20 DIAGNOSIS — Z8673 Personal history of transient ischemic attack (TIA), and cerebral infarction without residual deficits: Secondary | ICD-10-CM | POA: Insufficient documentation

## 2019-10-20 DIAGNOSIS — Z79899 Other long term (current) drug therapy: Secondary | ICD-10-CM | POA: Diagnosis not present

## 2019-10-20 DIAGNOSIS — Z7901 Long term (current) use of anticoagulants: Secondary | ICD-10-CM | POA: Diagnosis not present

## 2019-10-20 DIAGNOSIS — I4819 Other persistent atrial fibrillation: Secondary | ICD-10-CM | POA: Diagnosis not present

## 2019-10-20 DIAGNOSIS — E876 Hypokalemia: Secondary | ICD-10-CM | POA: Diagnosis not present

## 2019-10-20 DIAGNOSIS — Z87891 Personal history of nicotine dependence: Secondary | ICD-10-CM | POA: Insufficient documentation

## 2019-10-20 DIAGNOSIS — D6869 Other thrombophilia: Secondary | ICD-10-CM | POA: Diagnosis not present

## 2019-10-20 DIAGNOSIS — I10 Essential (primary) hypertension: Secondary | ICD-10-CM | POA: Insufficient documentation

## 2019-10-20 MED ORDER — DILTIAZEM HCL ER COATED BEADS 240 MG PO CP24: 240.0000 mg | ORAL_CAPSULE | Freq: Every day | ORAL | 6 refills | Status: DC

## 2019-10-20 NOTE — Progress Notes (Signed)
Primary Care Physician: Cyndi Bender, PA-C Primary Cardiologist: Dr Radford Pax Primary Electrophysiologist: none Referring Physician: Dr Radford Pax   Jeffrey Randolph is a 64 y.o. male with a history of CVA, HTN and persistent afib dx 12/2018 who is referred for consultation in the AF Clinic.  In March 2020 he underwent DCCV to NSR and has been on Eliquis since then.  He was referred to afib clinic but never followed up.  Presented again to ER in June with recurrent afib with RVR and converted to NSR and was sent home.  Readmitted again 05/25/19 with palpitations with no other sx and called EMS where he was found to be back in afib with RVr at 150-200pmb. Underwent DCCV x 2 but failed to to convert to NSR and was started on Iv Cardizem gtt and later transitioned to PO. His recent echo showed preserved EF 55-60%, mild LAE. Patient denies any significant alcohol use or snoring. He was started on flecainide 06/23/19 and converted to SR on the medication.   On follow up today, patient reports doing very well since his last visit. He has not had any palpitations or heart racing. He remains very active. He is tolerating the medication without difficulty.   Today, he denies symptoms of palpitations, chest pain, shortness of breath, orthopnea, PND, lower extremity edema, dizziness, syncope, snoring, daytime somnolence, bleeding, or neurologic sequela. The patient is tolerating medications without difficulties and is otherwise without complaint today.    Atrial Fibrillation Risk Factors:  he does not have symptoms or diagnosis of sleep apnea. he does not have a history of rheumatic fever. he does not have a history of alcohol use. The patient does not have a history of early familial atrial fibrillation or other arrhythmias.  he has a BMI of Body mass index is 26.46 kg/m.Marland Kitchen Filed Weights   10/20/19 1359  Weight: 81.3 kg    No family history on file.   Atrial Fibrillation Management history:  Previous  antiarrhythmic drugs: flecainide Previous cardioversions: 12/2018, 04/05/19, 05/25/19 (unsuccessful) Previous ablations: none CHADS2VASC score: 3 Anticoagulation history: Eliquis    Past Medical History:  Diagnosis Date  . Atrial fibrillation with rapid ventricular response ([place])   . BPH (benign prostatic hyperplasia)   . CVA (cerebral infarction)   . GERD (gastroesophageal reflux disease)   . History of stroke 05/26/2019  . HNP (herniated nucleus pulposus), lumbar 01/09/2015  . Hypertension   . Hypokalemia   . Hypomagnesemia 05/26/2019  . Stroke [facility])    Left foot always feel 2 x larger, walks with a limp.   Past Surgical History:  Procedure Laterality Date  . LUMBAR LAMINECTOMY/DECOMPRESSION MICRODISCECTOMY Right 01/09/2015   Procedure: LUMBAR LAMINECTOMY/DECOMPRESSION MICRODISCECTOMY 1 LEVEL;  Surgeon: Kary Kos, MD;  Location: [facility];  Service: Neurosurgery;  Laterality: Right;  LUMBAR LAMINECTOMY/DECOMPRESSION MICRODISCECTOMY 1 LEVEL RIGHT LUMBAR 2-3  . TONSILLECTOMY      Current Outpatient Medications  Medication Sig Dispense Refill  . apixaban (ELIQUIS) 5 MG TABS tablet Take 1 tablet (5 mg total) by mouth 2 (two) times daily for 30 days. 60 tablet 0  . diltiazem (CARDIZEM CD) 240 MG 24 hr capsule Take 1 capsule (240 mg total) by mouth daily. 30 capsule 6  . flecainide (TAMBOCOR) 50 MG tablet Take 1 tablet (50 mg total) by mouth 2 (two) times daily. 60 tablet 3  . omeprazole (PRILOSEC) 40 MG capsule Take 40 mg by mouth daily.    . potassium chloride 20 MEQ TBCR Take 40 mEq  by mouth daily. 60 tablet 0   No current facility-administered medications for this encounter.    No Known Allergies  Social History   Socioeconomic History  . Marital status: Single    Spouse name: Not on file  . Number of children: Not on file  . Years of education: Not on file  . Highest education level: Not on file  Occupational History  . Not on file  Tobacco Use  . Smoking status: Former  Smoker    Years: 3.00  . Smokeless tobacco: Never Used  . Tobacco comment: quit smoking in 1980's  Substance and Sexual Activity  . Alcohol use: No  . Drug use: No  . Sexual activity: Not on file  Other Topics Concern  . Not on file  Social History Narrative  . Not on file   Social Determinants of Health   Financial Resource Strain:   . Difficulty of Paying Living Expenses: Not on file  Food Insecurity:   . Worried About Programme researcher, broadcasting/film/video in the Last Year: Not on file  . Ran Out of Food in the Last Year: Not on file  Transportation Needs:   . Lack of Transportation (Medical): Not on file  . Lack of Transportation (Non-Medical): Not on file  Physical Activity:   . Days of Exercise per Week: Not on file  . Minutes of Exercise per Session: Not on file  Stress:   . Feeling of Stress : Not on file  Social Connections:   . Frequency of Communication with Friends and Family: Not on file  . Frequency of Social Gatherings with Friends and Family: Not on file  . Attends Religious Services: Not on file  . Active Member of Clubs or Organizations: Not on file  . Attends Banker Meetings: Not on file  . Marital Status: Not on file  Intimate Partner Violence:   . Fear of Current or Ex-Partner: Not on file  . Emotionally Abused: Not on file  . Physically Abused: Not on file  . Sexually Abused: Not on file     ROS- All systems are reviewed and negative except as per the HPI above.  Physical Exam: Vitals:   10/20/19 1359  BP: (!) 140/50  Pulse: 94  Weight: 81.3 kg  Height: 5\' 9"  (1.753 m)    GEN- The patient is well appearing, alert and oriented x 3 today.   HEENT-head normocephalic, atraumatic, sclera clear, conjunctiva pink, hearing intact, trachea midline. Lungs- Clear to ausculation bilaterally, normal work of breathing Heart- Regular rate and rhythm, no murmurs, rubs or gallops  GI- soft, NT, ND, + BS Extremities- no clubbing, cyanosis, or edema MS- no  significant deformity or atrophy Skin- no rash or lesion Psych- euthymic mood, full affect Neuro- strength and sensation are intact   Wt Readings from Last 3 Encounters:  10/20/19 81.3 kg  07/01/19 79.6 kg  06/23/19 77.5 kg    EKG today demonstrates SR HR 94, PR 168, QRS 86, QTc 442  Echo 05/26/19 demonstrated   1. The left ventricle has normal systolic function, with an ejection fraction of 55-60%. The cavity size was normal. Left ventricular diastolic function could not be evaluated.  2. The right ventricle has normal systolic function. The cavity was normal. There is no increase in right ventricular wall thickness.  3. Left atrial size was mildly dilated.  4. Mild thickening of the mitral valve leaflet.  5. The aortic valve was not well visualized. Sclerosis without any evidence  of stenosis of the aortic valve.  6. The aorta is normal unless otherwise noted.  Epic records are reviewed at length today  Assessment and Plan:  1. Persistent atrial fibrillation Patient appears to be maintaining SR. Continue flecainide 50 mg BID. Continue diltiazem to 240 mg daily Continue Eliquis 5 mg BID.  Patient due for labs with PCP soon.  This patients CHA2DS2-VASc Score and unadjusted Ischemic Stroke Rate (% per year) is equal to 3.2 % stroke rate/year from a score of 3  Above score calculated as 1 point each if present [CHF, HTN, DM, Vascular=MI/PAD/Aortic Plaque, Age if 65-74, or Male] Above score calculated as 2 points each if present [Age > 75, or Stroke/TIA/TE]   2. HTN Stable, no changes today.   Follow up in the AF clinic in 3 months. Dr Norris Cross office as scheduled.    Jorja Loa PA-C Afib Clinic [facility] [address] [place] [address] [place], [place] [address] [phone] 10/20/2019 2:31 PM

## 2020-01-18 ENCOUNTER — Ambulatory Visit (HOSPITAL_COMMUNITY): Payer: Medicaid Other | Admitting: Physician Assistant

## 2020-02-02 ENCOUNTER — Ambulatory Visit (HOSPITAL_COMMUNITY): Payer: Medicaid Other | Admitting: Physician Assistant

## 2020-02-10 ENCOUNTER — Ambulatory Visit (HOSPITAL_COMMUNITY): Payer: Medicaid Other | Admitting: Physician Assistant

## 2020-02-10 NOTE — Progress Notes (Signed)
Primary Care Physician: Lonie Peak, PA-C Primary Cardiologist: Dr Mayford Knife Primary Electrophysiologist: none Referring Physician: Dr Mayford Knife   Jeffrey Randolph is a 64 y.o. male with a history of CVA, HTN and persistent afib dx 12/2018 who is referred for follow up in the AF Clinic.  In March 2020 he underwent DCCV to NSR and has been on Eliquis since then.  He was referred to afib clinic but never followed up.  Presented again to ER in June with recurrent afib with RVR and converted to NSR and was sent home.  Readmitted again 05/25/19 with palpitations with no other sx and called EMS where he was found to be back in afib with RVR at 150-200pmb. Underwent DCCV x 2 but failed to to convert to NSR and was started on Iv Cardizem gtt and later transitioned to PO. His recent echo showed preserved EF 55-60%, mild LAE. Patient denies any significant alcohol use or snoring. He was started on flecainide 06/23/19 and converted to SR on the medication.   On follow up today, patient reports that he has done very well since his last visit. He denies any heart racing or palpitations. He is tolerating the medication without difficulty. He denies any bleeding issues on anticoagulation.   Today, he denies symptoms of palpitations, chest pain, shortness of breath, orthopnea, PND, lower extremity edema, dizziness, syncope, snoring, daytime somnolence, bleeding, or neurologic sequela. The patient is tolerating medications without difficulties and is otherwise without complaint today.    Atrial Fibrillation Risk Factors:  he does not have symptoms or diagnosis of sleep apnea. he does not have a history of rheumatic fever. he does not have a history of alcohol use. The patient does not have a history of early familial atrial fibrillation or other arrhythmias.  he has a BMI of Body mass index is 25.84 kg/m.Marland Kitchen Filed Weights   02/11/20 0907  Weight: 79.4 kg    No family history on file.   Atrial Fibrillation  Management history:  Previous antiarrhythmic drugs: flecainide Previous cardioversions: 12/2018, 04/05/19, 05/25/19 (unsuccessful) Previous ablations: none CHADS2VASC score: 3 Anticoagulation history: Eliquis    Past Medical History:  Diagnosis Date  . Atrial fibrillation with rapid ventricular response (HCC)   . BPH (benign prostatic hyperplasia)   . CVA (cerebral infarction)   . GERD (gastroesophageal reflux disease)   . History of stroke 05/26/2019  . HNP (herniated nucleus pulposus), lumbar 01/09/2015  . Hypertension   . Hypokalemia   . Hypomagnesemia 05/26/2019  . Stroke [facility])    Left foot always feel 2 x larger, walks with a limp.   Past Surgical History:  Procedure Laterality Date  . LUMBAR LAMINECTOMY/DECOMPRESSION MICRODISCECTOMY Right 01/09/2015   Procedure: LUMBAR LAMINECTOMY/DECOMPRESSION MICRODISCECTOMY 1 LEVEL;  Surgeon: Donalee Citrin, MD;  Location: [facility];  Service: Neurosurgery;  Laterality: Right;  LUMBAR LAMINECTOMY/DECOMPRESSION MICRODISCECTOMY 1 LEVEL RIGHT LUMBAR 2-3  . TONSILLECTOMY      Current Outpatient Medications  Medication Sig Dispense Refill  . apixaban (ELIQUIS) 5 MG TABS tablet Take 1 tablet (5 mg total) by mouth 2 (two) times daily for 30 days. 60 tablet 0  . diltiazem (CARDIZEM CD) 240 MG 24 hr capsule Take 1 capsule (240 mg total) by mouth daily. 30 capsule 6  . flecainide (TAMBOCOR) 50 MG tablet Take 1 tablet (50 mg total) by mouth 2 (two) times daily. 60 tablet 3  . omeprazole (PRILOSEC) 40 MG capsule Take 40 mg by mouth daily.    . potassium chloride 20  MEQ TBCR Take 40 mEq by mouth daily. 60 tablet 0   No current facility-administered medications for this encounter.    No Known Allergies  Social History   Socioeconomic History  . Marital status: Single    Spouse name: Not on file  . Number of children: Not on file  . Years of education: Not on file  . Highest education level: Not on file  Occupational History  . Not on file    Tobacco Use  . Smoking status: Former Smoker    Years: 3.00  . Smokeless tobacco: Never Used  . Tobacco comment: quit smoking in 1980's  Substance and Sexual Activity  . Alcohol use: No  . Drug use: No  . Sexual activity: Not on file  Other Topics Concern  . Not on file  Social History Narrative  . Not on file   Social Determinants of Health   Financial Resource Strain:   . Difficulty of Paying Living Expenses:   Food Insecurity:   . Worried About Charity fundraiser in the Last Year:   . Arboriculturist in the Last Year:   Transportation Needs:   . Film/video editor (Medical):   Marland Kitchen Lack of Transportation (Non-Medical):   Physical Activity:   . Days of Exercise per Week:   . Minutes of Exercise per Session:   Stress:   . Feeling of Stress :   Social Connections:   . Frequency of Communication with Friends and Family:   . Frequency of Social Gatherings with Friends and Family:   . Attends Religious Services:   . Active Member of Clubs or Organizations:   . Attends Archivist Meetings:   Marland Kitchen Marital Status:   Intimate Partner Violence:   . Fear of Current or Ex-Partner:   . Emotionally Abused:   Marland Kitchen Physically Abused:   . Sexually Abused:      ROS- All systems are reviewed and negative except as per the HPI above.  Physical Exam: Vitals:   02/11/20 0907  BP: (!) 142/60  Pulse: 79  Weight: 79.4 kg  Height: 5\' 9"  (1.753 m)    GEN- The patient is well appearing, alert and oriented x 3 today.   HEENT-head normocephalic, atraumatic, sclera clear, conjunctiva pink, hearing intact, trachea midline. Lungs- Clear to ausculation bilaterally, normal work of breathing Heart- Regular rate and rhythm, no murmurs, rubs or gallops  GI- soft, NT, ND, + BS Extremities- no clubbing, cyanosis, or edema MS- no significant deformity or atrophy Skin- no rash or lesion Psych- euthymic mood, full affect Neuro- strength and sensation are intact   Wt Readings from  Last 3 Encounters:  02/11/20 79.4 kg  10/20/19 81.3 kg  07/01/19 79.6 kg    EKG today demonstrates SR HR 79, PR 154, QRS 86, QTc 426  Echo 05/26/19 demonstrated   1. The left ventricle has normal systolic function, with an ejection fraction of 55-60%. The cavity size was normal. Left ventricular diastolic function could not be evaluated.  2. The right ventricle has normal systolic function. The cavity was normal. There is no increase in right ventricular wall thickness.  3. Left atrial size was mildly dilated.  4. Mild thickening of the mitral valve leaflet.  5. The aortic valve was not well visualized. Sclerosis without any evidence of stenosis of the aortic valve.  6. The aorta is normal unless otherwise noted.  Epic records are reviewed at length today  Assessment and Plan:  1. Persistent  atrial fibrillation Patient appears to be maintaining SR.  Continue flecainide 50 mg BID. Continue diltiazem to 240 mg daily Continue Eliquis 5 mg BID  This patients CHA2DS2-VASc Score and unadjusted Ischemic Stroke Rate (% per year) is equal to 3.2 % stroke rate/year from a score of 3  Above score calculated as 1 point each if present [CHF, HTN, DM, Vascular=MI/PAD/Aortic Plaque, Age if 65-74, or Male] Above score calculated as 2 points each if present [Age > 75, or Stroke/TIA/TE]  2. HTN Stable, no changes today.   Follow up in the AF clinic in 4 months.     Jorja Loa PA-C Afib Clinic [facility] [address] [place], [place] [address] [phone] 02/11/2020 9:32 AM

## 2020-02-11 ENCOUNTER — Encounter (HOSPITAL_COMMUNITY): Payer: Self-pay | Admitting: Physician Assistant

## 2020-02-11 ENCOUNTER — Ambulatory Visit (HOSPITAL_COMMUNITY)
Admission: RE | Admit: 2020-02-11 | Discharge: 2020-02-11 | Disposition: A | Discharge status: Home / Self Care | Payer: Medicaid Other | Source: Ambulatory Visit | Attending: Physician Assistant | Admitting: Physician Assistant

## 2020-02-11 ENCOUNTER — Other Ambulatory Visit: Payer: Self-pay

## 2020-02-11 VITALS — BP 142/60 | HR 79 | Ht 69.0 in | Wt 175.0 lb

## 2020-02-11 DIAGNOSIS — Z79899 Other long term (current) drug therapy: Secondary | ICD-10-CM | POA: Diagnosis not present

## 2020-02-11 DIAGNOSIS — D6869 Other thrombophilia: Secondary | ICD-10-CM

## 2020-02-11 DIAGNOSIS — Z8673 Personal history of transient ischemic attack (TIA), and cerebral infarction without residual deficits: Secondary | ICD-10-CM | POA: Diagnosis not present

## 2020-02-11 DIAGNOSIS — I10 Essential (primary) hypertension: Secondary | ICD-10-CM | POA: Insufficient documentation

## 2020-02-11 DIAGNOSIS — Z87891 Personal history of nicotine dependence: Secondary | ICD-10-CM | POA: Insufficient documentation

## 2020-02-11 DIAGNOSIS — Z7901 Long term (current) use of anticoagulants: Secondary | ICD-10-CM | POA: Insufficient documentation

## 2020-02-11 DIAGNOSIS — K219 Gastro-esophageal reflux disease without esophagitis: Secondary | ICD-10-CM | POA: Insufficient documentation

## 2020-02-11 DIAGNOSIS — I4819 Other persistent atrial fibrillation: Secondary | ICD-10-CM | POA: Diagnosis present

## 2020-10-24 ENCOUNTER — Other Ambulatory Visit (HOSPITAL_COMMUNITY): Payer: Self-pay | Admitting: *Deleted

## 2020-10-24 MED ORDER — DILTIAZEM HCL ER COATED BEADS 240 MG PO CP24: 240.0000 mg | ORAL_CAPSULE | Freq: Every day | ORAL | 0 refills | Status: DC

## 2020-11-07 ENCOUNTER — Ambulatory Visit (HOSPITAL_COMMUNITY)
Admission: RE | Admit: 2020-11-07 | Discharge: 2020-11-07 | Disposition: A | Discharge status: Home / Self Care | Payer: Medicaid Other | Source: Ambulatory Visit | Attending: Physician Assistant | Admitting: Physician Assistant

## 2020-11-07 ENCOUNTER — Other Ambulatory Visit: Payer: Self-pay

## 2020-11-07 ENCOUNTER — Encounter (HOSPITAL_COMMUNITY): Payer: Self-pay | Admitting: Physician Assistant

## 2020-11-07 VITALS — BP 142/59 | HR 79 | Ht 69.0 in | Wt 176.8 lb

## 2020-11-07 DIAGNOSIS — I10 Essential (primary) hypertension: Secondary | ICD-10-CM | POA: Insufficient documentation

## 2020-11-07 DIAGNOSIS — Z8673 Personal history of transient ischemic attack (TIA), and cerebral infarction without residual deficits: Secondary | ICD-10-CM | POA: Diagnosis not present

## 2020-11-07 DIAGNOSIS — D6869 Other thrombophilia: Secondary | ICD-10-CM | POA: Diagnosis not present

## 2020-11-07 DIAGNOSIS — Z79899 Other long term (current) drug therapy: Secondary | ICD-10-CM | POA: Diagnosis not present

## 2020-11-07 DIAGNOSIS — I4819 Other persistent atrial fibrillation: Secondary | ICD-10-CM | POA: Insufficient documentation

## 2020-11-07 DIAGNOSIS — Z7901 Long term (current) use of anticoagulants: Secondary | ICD-10-CM | POA: Insufficient documentation

## 2020-11-07 DIAGNOSIS — Z87891 Personal history of nicotine dependence: Secondary | ICD-10-CM | POA: Diagnosis not present

## 2020-11-07 NOTE — Progress Notes (Signed)
Electrophysiology TeleHealth Note   Due to national recommendations of social distancing due to COVID 19, Audio/video telehealth visit is felt to be most appropriate for this patient at this time.  See consent below from today for patient consent regarding telehealth for the [facility].    Date:  11/07/2020   ID:  Jeffrey Randolph, DOB Jul 26, 1956, MRN [id number]  Location: [facility] Provider location: [address] [place], [place] [address] Evaluation Performed: Follow up  PCP:  Lonie Peak, PA-C  Primary Cardiologist: Dr Mayford Knife Primary Electrophysiologist: none Referring Physician: Dr Mayford Knife     History of Present Illness: Jeffrey Randolph is a 65 y.o. male who presents via audio/video conferencing for a telehealth visit today. He has a history of CVA, HTN and persistent afib dx 12/2018 who is referred for follow up in the AF Clinic. In March 2020 he underwent DCCV to NSR and has been on Eliquis since then. He was referred to afib clinic but never followed up. Presented again to ER in June with recurrent afib with RVR and converted to NSR and was sent home. Readmitted again 05/25/19 with palpitations with no other sx and called EMS where he was found to be back in afib with RVR at 150-200pmb.Underwent DCCV x 2 but failed to to convert to NSR and was started on Iv Cardizem gtt and later transitioned to PO.His recent echo showed preserved EF 55-60%, mild LAE. Patient denies any significant alcohol use or snoring. He was started on flecainide 06/23/19 and converted to SR on the medication.   On follow up today, patient reports he has done well since his last visit. He denies any heart racing or palpitations. He has stopped flecainide for an unknown reason but has done well off AAD. He denies any bleeding issues on anticoagulation.   Today, he denies symptoms of palpitations, chest pain, shortness of breath, orthopnea, PND, lower extremity edema, claudication, dizziness,  presyncope, syncope, bleeding, or neurologic sequela. The patient is tolerating medications without difficulties and is otherwise without complaint today.    Atrial Fibrillation Risk Factors:  he does not have symptoms or diagnosis of sleep apnea. he does not have a history of rheumatic fever. he does not have a history of alcohol use. The patient does not have a history of early familial atrial fibrillation or other arrhythmias.  he has a BMI of Body mass index is 26.11 kg/m.Marland Kitchen Filed Weights   11/07/20 1030  Weight: 80.2 kg     Atrial Fibrillation Management history:  Previous antiarrhythmic drugs: flecainide Previous cardioversions: 12/2018, 04/05/19, 05/25/19 (unsuccessful) Previous ablations: none CHADS2VASC score: 3 Anticoagulation history: Eliquis    Past Medical History:  Diagnosis Date  . Atrial fibrillation with rapid ventricular response (HCC)   . BPH (benign prostatic hyperplasia)   . CVA (cerebral infarction)   . GERD (gastroesophageal reflux disease)   . History of stroke 05/26/2019  . HNP (herniated nucleus pulposus), lumbar 01/09/2015  . Hypertension   . Hypokalemia   . Hypomagnesemia 05/26/2019  . Stroke [facility])    Left foot always feel 2 x larger, walks with a limp.   Past Surgical History:  Procedure Laterality Date  . LUMBAR LAMINECTOMY/DECOMPRESSION MICRODISCECTOMY Right 01/09/2015   Procedure: LUMBAR LAMINECTOMY/DECOMPRESSION MICRODISCECTOMY 1 LEVEL;  Surgeon: Donalee Citrin, MD;  Location: [facility];  Service: Neurosurgery;  Laterality: Right;  LUMBAR LAMINECTOMY/DECOMPRESSION MICRODISCECTOMY 1 LEVEL RIGHT LUMBAR 2-3  . TONSILLECTOMY       Current Outpatient Medications  Medication Sig  Dispense Refill  . apixaban (ELIQUIS) 5 MG TABS tablet Take 1 tablet (5 mg total) by mouth 2 (two) times daily for 30 days. 60 tablet 0  . diltiazem (CARDIZEM CD) 240 MG 24 hr capsule Take 1 capsule (240 mg total) by mouth daily. 30 capsule 0  . omeprazole (PRILOSEC) 40 MG  capsule Take 40 mg by mouth daily.     No current facility-administered medications for this encounter.    Allergies:   Patient has no known allergies.   Social History:  The patient  reports that he has quit smoking. He quit after 3.00 years of use. He has never used smokeless tobacco. He reports that he does not drink alcohol and does not use drugs.   Family History:  The patient's  family history is not on file.    ROS:  Please see the history of present illness.   All other systems are personally reviewed and negative.   Exam: Well appearing, alert and conversant, regular work of breathing,  good skin color Eyes- anicteric, neuro- grossly intact, skin- no apparent rash or lesions or cyanosis, mouth- oral mucosa is pink  Recent Labs: No results found for requested labs within last 8760 hours.  personally reviewed   ECG today demonstrates SR Vent. rate 79 BPM PR interval 148 ms QRS duration 80 ms QT/QTc 380/435 ms  Epic records reviewed at length.   CHA2DS2-VASc Score = 3  The patient's score is based upon: CHF History: No HTN History: Yes Diabetes History: No Stroke History: Yes Vascular Disease History: No Age Score: 0 Gender Score: 0  {Click here to calculate score.  REFRESH note before signing.       :[id number]}    ASSESSMENT AND PLAN: 1. Persistent Atrial Fibrillation (ICD10:  I48.19) The patient's CHA2DS2-VASc score is 3, indicating a 3.2% annual risk of stroke.   Patient appears to be maintaining SR. Continue diltiazem 240 mg daily Continue Eliquis 5 mg BID Patient due for lab work with PCP. Will request copy of results.   2. Secondary Hypercoagulable State (ICD10:  D68.69) The patient is at significant risk for stroke/thromboembolism based upon his CHA2DS2-VASc Score of 3.  Continue Apixaban (Eliquis).   3. HTN Stable, no changes today.   Follow-up with Dr Mayford Knife in 6 months. AF clinic in one year.   Current medicines are reviewed at length with  the patient today.   The patient does not have concerns regarding his medicines.  The following changes were made today:  none  Labs/ tests ordered today include:  Orders Placed This Encounter  Procedures  . EKG 12-Lead    Patient Risk:  after full review of this patients clinical status, I feel that they are at moderate risk at this time.   Today, I have spent 19 minutes with the patient with telehealth technology discussing the above.  Dalia Heading PA-C 11/07/2020 10:55 AM  Afib Clinic [facility] [address] [place], [place] [address] [phone]   I hereby voluntarily request, consent and authorize the Atrial Fibrillation Clinic and its employed or contracted physicians, physician assistants, nurse practitioners or other licensed health care professionals (the Practitioner), to provide me with telemedicine health care services (the "Services") as deemed necessary by the treating Practitioner. I acknowledge and consent to receive the Services by the Practitioner via telemedicine. I understand that the telemedicine visit will involve communicating with the Practitioner through live audiovisual communication technology and the disclosure of certain medical information by electronic transmission.  I acknowledge that I have been given the opportunity to request an in-person assessment or other available alternative prior to the telemedicine visit and am voluntarily participating in the telemedicine visit.   I understand that I have the right to withhold or withdraw my consent to the use of telemedicine in the course of my care at any time, without affecting my right to future care or treatment, and that the Practitioner or I may terminate the telemedicine visit at any time. I understand that I have the right to inspect all information obtained and/or recorded in the course of the telemedicine visit and may receive copies of available information for a reasonable fee.  I  understand that some of the potential risks of receiving the Services via telemedicine include:   Delay or interruption in medical evaluation due to technological equipment failure or disruption;  Information transmitted may not be sufficient (e.g. poor resolution of images) to allow for appropriate medical decision making by the Practitioner; and/or  In rare instances, security protocols could fail, causing a breach of personal health information.   Furthermore, I acknowledge that it is my responsibility to provide information about my medical history, conditions and care that is complete and accurate to the best of my ability. I acknowledge that Practitioner's advice, recommendations, and/or decision may be based on factors not within their control, such as incomplete or inaccurate data provided by me or distortions of diagnostic images or specimens that may result from electronic transmissions. I understand that the practice of medicine is not an exact science and that Practitioner makes no warranties or guarantees regarding treatment outcomes. I acknowledge that I will receive a copy of this consent concurrently upon execution via email to the email address I last provided but may also request a printed copy by calling the office of the Atrial Fibrillation Clinic.  I understand that my insurance will be billed for this visit.   I have read or had this consent read to me.  I understand the contents of this consent, which adequately explains the benefits and risks of the Services being provided via telemedicine.  I have been provided ample opportunity to ask questions regarding this consent and the Services and have had my questions answered to my satisfaction.  I give my informed consent for the services to be provided through the use of telemedicine in my medical care  By participating in this telemedicine visit I agree to the above.

## 2020-11-07 NOTE — Progress Notes (Signed)
Patient is being seen in the Atrial Fibrillation Clinic. VS, EKG, and device interrogations performed in the clinic. Clint Fenton, PA is at home and seeing patients via telemedicine as he is in quarantine.  

## 2020-11-07 NOTE — Addendum Note (Signed)
Encounter addended by: Shona Simpson, RN on: 11/07/2020 11:32 AM  Actions taken: Clinical Note Signed

## 2020-12-01 ENCOUNTER — Other Ambulatory Visit (HOSPITAL_COMMUNITY): Payer: Self-pay | Admitting: Nurse Practitioner

## 2021-07-02 ENCOUNTER — Ambulatory Visit: Payer: Medicaid Other | Admitting: Dermatology

## 2022-01-21 ENCOUNTER — Other Ambulatory Visit: Payer: Self-pay | Admitting: Physician Assistant

## 2022-01-21 DIAGNOSIS — R748 Abnormal levels of other serum enzymes: Secondary | ICD-10-CM

## 2022-02-26 ENCOUNTER — Ambulatory Visit
Admission: RE | Admit: 2022-02-26 | Discharge: 2022-02-26 | Disposition: A | Discharge status: Home / Self Care | Payer: Medicare Other | Source: Ambulatory Visit | Attending: Physician Assistant | Admitting: Physician Assistant

## 2022-02-26 DIAGNOSIS — R748 Abnormal levels of other serum enzymes: Secondary | ICD-10-CM | POA: Insufficient documentation

## 2022-03-20 ENCOUNTER — Emergency Department: Payer: Medicare Other

## 2022-03-20 ENCOUNTER — Observation Stay
Admission: EM | Admit: 2022-03-20 | Discharge: 2022-03-21 | Disposition: A | Discharge status: Home Health | Payer: Medicare Other | Attending: Family Medicine | Admitting: Family Medicine

## 2022-03-20 ENCOUNTER — Encounter: Payer: Self-pay | Admitting: Internal Medicine

## 2022-03-20 ENCOUNTER — Other Ambulatory Visit: Payer: Self-pay

## 2022-03-20 DIAGNOSIS — J9811 Atelectasis: Secondary | ICD-10-CM | POA: Diagnosis not present

## 2022-03-20 DIAGNOSIS — I482 Chronic atrial fibrillation, unspecified: Secondary | ICD-10-CM | POA: Diagnosis not present

## 2022-03-20 DIAGNOSIS — N400 Enlarged prostate without lower urinary tract symptoms: Secondary | ICD-10-CM | POA: Insufficient documentation

## 2022-03-20 DIAGNOSIS — Z7901 Long term (current) use of anticoagulants: Secondary | ICD-10-CM | POA: Insufficient documentation

## 2022-03-20 DIAGNOSIS — R0781 Pleurodynia: Secondary | ICD-10-CM | POA: Diagnosis not present

## 2022-03-20 DIAGNOSIS — F1011 Alcohol abuse, in remission: Secondary | ICD-10-CM | POA: Insufficient documentation

## 2022-03-20 DIAGNOSIS — Z9181 History of falling: Secondary | ICD-10-CM | POA: Insufficient documentation

## 2022-03-20 DIAGNOSIS — I2510 Atherosclerotic heart disease of native coronary artery without angina pectoris: Secondary | ICD-10-CM | POA: Diagnosis not present

## 2022-03-20 DIAGNOSIS — Z8673 Personal history of transient ischemic attack (TIA), and cerebral infarction without residual deficits: Secondary | ICD-10-CM | POA: Diagnosis not present

## 2022-03-20 DIAGNOSIS — Z87891 Personal history of nicotine dependence: Secondary | ICD-10-CM | POA: Diagnosis not present

## 2022-03-20 DIAGNOSIS — K219 Gastro-esophageal reflux disease without esophagitis: Secondary | ICD-10-CM | POA: Insufficient documentation

## 2022-03-20 DIAGNOSIS — R54 Age-related physical debility: Secondary | ICD-10-CM | POA: Insufficient documentation

## 2022-03-20 DIAGNOSIS — Y9301 Activity, walking, marching and hiking: Secondary | ICD-10-CM | POA: Diagnosis not present

## 2022-03-20 DIAGNOSIS — R9431 Abnormal electrocardiogram [ECG] [EKG]: Secondary | ICD-10-CM | POA: Insufficient documentation

## 2022-03-20 DIAGNOSIS — I700 Atherosclerosis of aorta: Secondary | ICD-10-CM | POA: Diagnosis not present

## 2022-03-20 DIAGNOSIS — R079 Chest pain, unspecified: Secondary | ICD-10-CM | POA: Diagnosis present

## 2022-03-20 DIAGNOSIS — W010XXA Fall on same level from slipping, tripping and stumbling without subsequent striking against object, initial encounter: Secondary | ICD-10-CM | POA: Diagnosis not present

## 2022-03-20 DIAGNOSIS — R7989 Other specified abnormal findings of blood chemistry: Secondary | ICD-10-CM | POA: Diagnosis not present

## 2022-03-20 DIAGNOSIS — R55 Syncope and collapse: Secondary | ICD-10-CM | POA: Diagnosis not present

## 2022-03-20 DIAGNOSIS — K7460 Unspecified cirrhosis of liver: Secondary | ICD-10-CM | POA: Diagnosis present

## 2022-03-20 DIAGNOSIS — I10 Essential (primary) hypertension: Secondary | ICD-10-CM | POA: Diagnosis present

## 2022-03-20 DIAGNOSIS — Y92009 Unspecified place in unspecified non-institutional (private) residence as the place of occurrence of the external cause: Secondary | ICD-10-CM

## 2022-03-20 DIAGNOSIS — S0990XA Unspecified injury of head, initial encounter: Secondary | ICD-10-CM | POA: Insufficient documentation

## 2022-03-20 DIAGNOSIS — Z800 Family history of malignant neoplasm of digestive organs: Secondary | ICD-10-CM | POA: Insufficient documentation

## 2022-03-20 DIAGNOSIS — J90 Pleural effusion, not elsewhere classified: Secondary | ICD-10-CM | POA: Insufficient documentation

## 2022-03-20 DIAGNOSIS — Y92098 Other place in other non-institutional residence as the place of occurrence of the external cause: Secondary | ICD-10-CM | POA: Diagnosis not present

## 2022-03-20 DIAGNOSIS — R188 Other ascites: Principal | ICD-10-CM | POA: Insufficient documentation

## 2022-03-20 DIAGNOSIS — W19XXXA Unspecified fall, initial encounter: Secondary | ICD-10-CM | POA: Diagnosis not present

## 2022-03-20 DIAGNOSIS — K8020 Calculus of gallbladder without cholecystitis without obstruction: Secondary | ICD-10-CM | POA: Diagnosis not present

## 2022-03-20 DIAGNOSIS — R0789 Other chest pain: Secondary | ICD-10-CM | POA: Insufficient documentation

## 2022-03-20 DIAGNOSIS — R140 Abdominal distension (gaseous): Secondary | ICD-10-CM | POA: Insufficient documentation

## 2022-03-20 DIAGNOSIS — R262 Difficulty in walking, not elsewhere classified: Secondary | ICD-10-CM | POA: Insufficient documentation

## 2022-03-20 DIAGNOSIS — R000 Tachycardia, unspecified: Secondary | ICD-10-CM | POA: Diagnosis not present

## 2022-03-20 DIAGNOSIS — R2689 Other abnormalities of gait and mobility: Secondary | ICD-10-CM | POA: Insufficient documentation

## 2022-03-20 DIAGNOSIS — Z79899 Other long term (current) drug therapy: Secondary | ICD-10-CM | POA: Insufficient documentation

## 2022-03-20 LAB — LIPASE, BLOOD: Lipase: 26 U/L (ref 11–51)

## 2022-03-20 LAB — BODY FLUID CELL COUNT WITH DIFFERENTIAL
Eos, Fluid: 0 %
Lymphs, Fluid: 37 %
Monocyte-Macrophage-Serous Fluid: 61 %
Neutrophil Count, Fluid: 2 %
Total Nucleated Cell Count, Fluid: 215 cu mm

## 2022-03-20 LAB — ALBUMIN, PLEURAL OR PERITONEAL FLUID: Albumin, Fluid: <1.5 g/dL

## 2022-03-20 LAB — BASIC METABOLIC PANEL
Anion gap: 7 (ref 5–15)
BUN: <5 mg/dL — ABNORMAL LOW (ref 8–23)
CO2: 25 mmol/L (ref 22–32)
Calcium: 8.6 mg/dL — ABNORMAL LOW (ref 8.9–10.3)
Chloride: 98 mmol/L (ref 98–111)
Creatinine, Ser: 0.43 mg/dL — ABNORMAL LOW (ref 0.61–1.24)
GFR, Estimated: >60 mL/min (ref >60)
Glucose, Bld: 123 mg/dL — ABNORMAL HIGH (ref 70–99)
Potassium: 3.9 mmol/L (ref 3.5–5.1)
Sodium: 130 mmol/L — ABNORMAL LOW (ref 135–145)

## 2022-03-20 LAB — HEPATIC FUNCTION PANEL
ALT: 17 U/L (ref 0–44)
AST: 35 U/L (ref 15–41)
Albumin: 3.1 g/dL — ABNORMAL LOW (ref 3.5–5.0)
Alkaline Phosphatase: 111 U/L (ref 38–126)
Bilirubin, Direct: 0.5 mg/dL — ABNORMAL HIGH (ref 0.0–0.2)
Indirect Bilirubin: 1.6 mg/dL — ABNORMAL HIGH (ref 0.3–0.9)
Total Bilirubin: 2.1 mg/dL — ABNORMAL HIGH (ref 0.3–1.2)
Total Protein: 6.9 g/dL (ref 6.5–8.1)

## 2022-03-20 LAB — CBC
HCT: 33.6 % — ABNORMAL LOW (ref 39.0–52.0)
Hemoglobin: 11.4 g/dL — ABNORMAL LOW (ref 13.0–17.0)
MCH: 35.3 pg — ABNORMAL HIGH (ref 26.0–34.0)
MCHC: 33.9 g/dL (ref 30.0–36.0)
MCV: 104 fL — ABNORMAL HIGH (ref 80.0–100.0)
Platelets: 205 10*3/uL (ref 150–400)
RBC: 3.23 MIL/uL — ABNORMAL LOW (ref 4.22–5.81)
RDW: 20.2 % — ABNORMAL HIGH (ref 11.5–15.5)
WBC: 4 10*3/uL (ref 4.0–10.5)
nRBC: 0.5 % — ABNORMAL HIGH (ref 0.0–0.2)

## 2022-03-20 LAB — HEPATITIS PANEL, ACUTE
HCV Ab: NONREACTIVE
Hep A IgM: NONREACTIVE
Hep B C IgM: NONREACTIVE
Hepatitis B Surface Ag: NONREACTIVE

## 2022-03-20 LAB — PROTEIN, PLEURAL OR PERITONEAL FLUID: Total protein, fluid: <3 g/dL

## 2022-03-20 LAB — LACTATE DEHYDROGENASE, PLEURAL OR PERITONEAL FLUID: LD, Fluid: 45 U/L — ABNORMAL HIGH (ref 3–23)

## 2022-03-20 LAB — APTT: aPTT: 34 seconds (ref 24–36)

## 2022-03-20 LAB — LACTATE DEHYDROGENASE: LDH: 132 U/L (ref 98–192)

## 2022-03-20 LAB — TROPONIN I (HIGH SENSITIVITY)
Troponin I (High Sensitivity): 3 ng/L (ref <18)
Troponin I (High Sensitivity): 2 ng/L (ref <18)

## 2022-03-20 LAB — AMMONIA: Ammonia: 17 umol/L (ref 9–35)

## 2022-03-20 LAB — PROTIME-INR
INR: 1.4 — ABNORMAL HIGH (ref 0.8–1.2)
Prothrombin Time: 16.5 seconds — ABNORMAL HIGH (ref 11.4–15.2)

## 2022-03-20 LAB — HIV ANTIBODY (ROUTINE TESTING W REFLEX): HIV Screen 4th Generation wRfx: NONREACTIVE

## 2022-03-20 MED ORDER — OXYCODONE-ACETAMINOPHEN 5-325 MG PO TABS: 1.0000 | ORAL_TABLET | ORAL | Status: DC | PRN

## 2022-03-20 MED ORDER — DILTIAZEM HCL ER COATED BEADS 120 MG PO CP24
240.0000 mg | ORAL_CAPSULE | Freq: Every day | ORAL | Status: DC
  Administered 2022-03-20 – 2022-03-21 (×2): 240 mg via ORAL
  Filled 2022-03-20: qty 1
  Filled 2022-03-20 (×2): qty 2

## 2022-03-20 MED ORDER — IOHEXOL 300 MG/ML  SOLN
100.0000 mL | Freq: Once | INTRAMUSCULAR | Status: AC | PRN
  Administered 2022-03-20: 100 mL via INTRAVENOUS

## 2022-03-20 MED ORDER — PANTOPRAZOLE SODIUM 40 MG PO TBEC
40.0000 mg | DELAYED_RELEASE_TABLET | Freq: Every day | ORAL | Status: DC
  Administered 2022-03-20 – 2022-03-21 (×2): 40 mg via ORAL
  Filled 2022-03-20 (×2): qty 1

## 2022-03-20 MED ORDER — ACETAMINOPHEN 325 MG PO TABS: 650.0000 mg | ORAL_TABLET | Freq: Four times a day (QID) | ORAL | Status: DC | PRN

## 2022-03-20 MED ORDER — ALBUMIN HUMAN 25 % IV SOLN
25.0000 g | Freq: Once | INTRAVENOUS | Status: AC
Start: 2022-03-20 — End: 2022-03-20
  Administered 2022-03-20: 25 g via INTRAVENOUS
  Filled 2022-03-20: qty 100

## 2022-03-20 MED ORDER — PREGABALIN 50 MG PO CAPS
100.0000 mg | ORAL_CAPSULE | Freq: Three times a day (TID) | ORAL | Status: DC
  Administered 2022-03-20 – 2022-03-21 (×3): 100 mg via ORAL
  Filled 2022-03-20 (×3): qty 2

## 2022-03-20 MED ORDER — APIXABAN 5 MG PO TABS
5.0000 mg | ORAL_TABLET | Freq: Two times a day (BID) | ORAL | Status: DC
  Administered 2022-03-20 – 2022-03-21 (×2): 5 mg via ORAL
  Filled 2022-03-20 (×2): qty 1

## 2022-03-20 MED ORDER — SPIRONOLACTONE 25 MG PO TABS
50.0000 mg | ORAL_TABLET | Freq: Two times a day (BID) | ORAL | Status: DC
  Administered 2022-03-20 – 2022-03-21 (×2): 50 mg via ORAL
  Filled 2022-03-20 (×2): qty 2

## 2022-03-20 MED ORDER — ONDANSETRON HCL 4 MG/2ML IJ SOLN: 4.0000 mg | Freq: Three times a day (TID) | INTRAMUSCULAR | Status: DC | PRN

## 2022-03-20 MED ORDER — ALBUMIN HUMAN 25 % IV SOLN
12.5000 g | Freq: Once | INTRAVENOUS | Status: DC
Start: 2022-03-20 — End: 2022-03-20

## 2022-03-20 MED ORDER — HYDRALAZINE HCL 20 MG/ML IJ SOLN
5.0000 mg | INTRAMUSCULAR | Status: DC | PRN
Start: 2022-03-20 — End: 2022-03-21

## 2022-03-20 NOTE — Assessment & Plan Note (Signed)
This is most likely due to alcohol abuse.  Patient stopped drinking alcohol 2 weeks ago. Paracentesis was done by IR, initial fluid analysis showed PMN 215.  Patient does not have abdominal pain.  Does not seem to have SBP.  Patient had ultrasound on 11/29/2021 which showed: 1. Significant ascites. 2. Cholelithiasis and sludge in the gallbladder without wall thickening or Murphy's sign. 3. Diffuse increased echogenicity in the liver is nonspecific but often seen with hepatic steatosis or hepatocellular disease.  -place telemetry bed for observation -Paracentesis was performed, follow-up fluid analysis -25 gram of albumin is ordered -Check ammonia level --> 17 -Check hepatitis panel and HIV antibody -Patient is taking spironolactone

## 2022-03-20 NOTE — ED Provider Notes (Signed)
[facility] Provider Note    Event Date/Time   First MD Initiated Contact with Patient 03/20/22 [phone]     (approximate)   History   Chest Pain and Fall   HPI  Jeffrey Randolph is a 66 y.o. male with a history of A-fib reportedly on anticoagulation presents to the ER for evaluation of right-sided rib pain abdominal distention poor p.o. intake poor appetite for the past several weeks.  Started having pain in the right ribs and right side of his abdomen 2 days ago when he had a fall coming out of a building that he lives in.  Does not member if he hit his head.  Having trouble getting around and moving secondary to the pain.  Denies any melena and hematochezia.  No fevers.  Does not recall any palpitations or chest discomfort prior to the fall.  Does not think that he passed out.     Physical Exam   Triage Vital Signs: ED Triage Vitals  Enc Vitals Group     BP 03/20/22 0708 (!) 127/95     Pulse Rate 03/20/22 0708 (!) 104     Resp 03/20/22 0708 18     Temp 03/20/22 0708 98.5 F (36.9 C)     Temp Source 03/20/22 0708 Oral     SpO2 03/20/22 0708 97 %     Weight 03/20/22 0705 181 lb (82.1 kg)     Height 03/20/22 0705 5\' 9"  (1.753 m)     Head Circumference --      Peak Flow --      Pain Score 03/20/22 0704 5     Pain Loc --      Pain Edu? --      Excl. in [place]? --     Most recent vital signs: Vitals:   03/20/22 0900 03/20/22 1007  BP: 126/61 (!) 145/78  Pulse: 97 96  Resp: 18   Temp:    SpO2: 97% 100%     Constitutional: Alert Eyes: Conjunctivae are normal.  Head: Atraumatic. Nose: No congestion/rhinnorhea. Mouth/Throat: Mucous membranes are moist.   Neck: Painless ROM.  Cardiovascular:   Good peripheral circulation. No m/g/r Respiratory: Normal respiratory effort.  No retractions.  Gastrointestinal: soft, distended, mil ttp in ruq and right flank Musculoskeletal:  no deformity Neurologic:  MAE spontaneously. No gross focal neurologic deficits  are appreciated.  Skin:  Skin is warm, dry and intact. No rash noted. Psychiatric: Mood and affect are normal. Speech and behavior are normal.    ED Results / Procedures / Treatments   Labs (all labs ordered are listed, but only abnormal results are displayed) Labs Reviewed  BASIC METABOLIC PANEL - Abnormal; Notable for the following components:      Result Value   Sodium 130 (*)    Glucose, Bld 123 (*)    BUN <5 (*)    Creatinine, Ser 0.43 (*)    Calcium 8.6 (*)    All other components within normal limits  CBC - Abnormal; Notable for the following components:   RBC 3.23 (*)    Hemoglobin 11.4 (*)    HCT 33.6 (*)    MCV 104.0 (*)    MCH 35.3 (*)    RDW 20.2 (*)    nRBC 0.5 (*)    All other components within normal limits  PROTIME-INR - Abnormal; Notable for the following components:   Prothrombin Time 16.5 (*)    INR 1.4 (*)    All other components  within normal limits  HEPATIC FUNCTION PANEL - Abnormal; Notable for the following components:   Albumin 3.1 (*)    Total Bilirubin 2.1 (*)    Bilirubin, Direct 0.5 (*)    Indirect Bilirubin 1.6 (*)    All other components within normal limits  BODY FLUID CULTURE W GRAM STAIN  APTT  LIPASE, BLOOD  AMMONIA  LACTATE DEHYDROGENASE  BODY FLUID CELL COUNT WITH DIFFERENTIAL  HEPATITIS PANEL, ACUTE  HIV ANTIBODY (ROUTINE TESTING W REFLEX)  LACTATE DEHYDROGENASE, PLEURAL OR PERITONEAL FLUID  ALBUMIN, PLEURAL OR PERITONEAL FLUID   PROTEIN, PLEURAL OR PERITONEAL FLUID  LIPASE, FLUID  PROTEIN, BODY FLUID (OTHER)  CYTOLOGY - NON PAP  TROPONIN I (HIGH SENSITIVITY)  TROPONIN I (HIGH SENSITIVITY)     EKG  ED ECG REPORT I, Merlyn Lot, the attending physician, personally viewed and interpreted this ECG.   Date: 03/20/2022  EKG Time: 7:06  Rate: 105  Rhythm: sinus  Axis: normal  Intervals: normal  ST&T Change: nonspecific st abn, no stemi    RADIOLOGY Please see ED Course for my review and interpretation.  I  personally reviewed all radiographic images ordered to evaluate for the above acute complaints and reviewed radiology reports and findings.  These findings were personally discussed with the patient.  Please see medical record for radiology report.    PROCEDURES:  Critical Care performed: No  Procedures   MEDICATIONS ORDERED IN ED: Medications  ondansetron (ZOFRAN) injection 4 mg (has no administration in time range)  acetaminophen (TYLENOL) tablet 650 mg (has no administration in time range)  hydrALAZINE (APRESOLINE) injection 5 mg (has no administration in time range)  iohexol (OMNIPAQUE) 300 MG/ML solution 100 mL (100 mLs Intravenous Contrast Given 03/20/22 0820)  albumin human 25 % solution 25 g (25 g Intravenous New Bag/Given 03/20/22 1233)     IMPRESSION / MDM / ASSESSMENT AND PLAN / ED COURSE  I reviewed the triage vital signs and the nursing notes.                              Differential diagnosis includes, but is not limited to, contusion, fracture, effusion, viscus injury, ascites, malignancy, SDH, IPH, SBO  Patient presented to ER for evaluation of symptoms as described above.  Review of records patient does have family history of pancreatic cancer and had recent outpatient ultrasound showing evidence of ascites but I do not see any documentation of follow-up on this.  Patient frail-appearing mildly tachycardic but otherwise well perfused.  Given concern for the above differential after fall 2 days ago I am concerned for traumatic injury.  This presenting complaint could reflect a potentially life-threatening illness therefore the patient will be placed on continuous pulse oximetry and telemetry for monitoring.  Laboratory evaluation will be sent to evaluate for the above complaints.  CT imaging will be ordered for the above differential.    Clinical Course as of 03/20/22 1251  Wed Mar 20, 2022  0740 Chest x-ray by my interpretation does not show any evidence of  pneumothorax or contusion. [PR]  [phone] CT head by my interpretation does not show any evidence of subdural hematoma.  CT abdomen and pelvis on my interpretation shows large volume ascites no obstruction. [PR]  [phone] Patient hemodynamically stable at this time.  Given moderate volume ascites with new diagnosis of probable cirrhosis uncertain etiology with syncopal episode I do believe it is appropriate for patient to be admitted to the  hospital for paracentesis for the diagnostic work-up and evaluation.  Will consult hospitalist for admission. [PR]    Clinical Course User Index [PR] Merlyn Lot, MD      FINAL CLINICAL IMPRESSION(S) / ED DIAGNOSES   Final diagnoses:  Syncope and collapse  Other ascites     Rx / DC Orders   ED Discharge Orders     None        Note:  This document was prepared using Dragon voice recognition software and may include unintentional dictation errors.    Merlyn Lot, MD 03/20/22 1251

## 2022-03-20 NOTE — ED Triage Notes (Signed)
Pt here with CP and a fall that started 2 days ago. Pt states he fell hard at home but denies hitting his head. Pt is c/o right side rib pain and generalized CP. Pt states CP does not radiate. Pt denies N/V/D.

## 2022-03-20 NOTE — Assessment & Plan Note (Signed)
-   IV hydralazine as needed -Continue Cardizem

## 2022-03-20 NOTE — Assessment & Plan Note (Signed)
Etiology is not clear, very atypical chest pain.  Troponin negative x2.  No shortness of breath.  Patient is taking Eliquis, low suspicions for PE -As needed Percocet and Tylenol for pain -check A1c and FLP

## 2022-03-20 NOTE — Assessment & Plan Note (Signed)
CT head negative.  CT scanning of chest/abdomen/pelvis negative for acute injury - Fall precaution

## 2022-03-20 NOTE — ED Notes (Signed)
No albumin in pyxis.  Requested from pharmacy

## 2022-03-20 NOTE — Procedures (Signed)
Ultrasound-guided diagnostic and therapeutic paracentesis performed yielding 750 miiliters of straw colored fluid.  Fluid was sent to lab for analysis. No immediate complications. EBL is none.    Marliss Coots, MD Pager: [phone]

## 2022-03-20 NOTE — ED Notes (Signed)
Pt independent to bathroom 

## 2022-03-20 NOTE — ED Notes (Signed)
Informed RN bed assigned 

## 2022-03-20 NOTE — H&P (Signed)
History and Physical    Jeffrey Randolph BMW:[id number] DOB: 01-Jul-1956 DOA: 03/20/2022  Referring MD/NP/PA:   PCP: Lonie Peak, PA-C   Patient coming from:  The patient is coming from home.  At baseline, pt is independent for most of ADL.        Chief Complaint: Abdominal distention, chest pain  HPI: Jeffrey Randolph is a 66 y.o. male with medical history significant of hypertension, stroke, GERD, BPH, atrial fibrillation on Eliquis,` alcohol abuse in remission (stopped drinking alcohol 2 weeks ago), who presents with abdominal distention and chest pain.  Patient states that he noticed that his abdomen is distended recently which has been progressively worsening.  Patient does not have nausea, vomiting, diarrhea or abdominal pain.  Patient states he had small amount of loose stool earlier, but no active diarrhea.  He also reports chest pain which is located in the right lower chest, sharp, mild to moderate, nonradiating.  Denies cough, shortness of breath.  No symptoms of UTI. Patient states that he used to drink beers several times per week, 4-5 beers each time for more than 20 years.  He stopped drinking alcohol 2 weeks ago.  He does not have symptoms of withdrawal.   Pt states that he has generalized weakness, poor appetite and decreased oral intake.  He states that 2 days ago, he accidentally fell when he was walking to the back door and tripped his steps.  No loss of consciousness.  Denies head or neck injury.   Data Reviewed and ED Course: pt was found to have WBC 4.0, INR 1.4, PTT 34, ammonia 17, troponin level 3, 2, lipase 26, liver function normal except for total bilirubin 2.3, temperature normal, blood pressure 126/61, heart rate of 104, 97, RR 18, oxygen saturation 97% on room air.  CT of head is negative for acute intracranial abnormalities.  Chest x-ray is negative for infiltration. Pt is place in tele bed for obs.  CT angiogram of chest/abdomen/pelvis: No definite evidence of traumatic  injury seen in the chest, abdomen or pelvis.   Findings concerning for hepatic cirrhosis with recanalized umbilical vein. Moderate ascites is noted.   Coronary artery calcifications are noted suggesting coronary artery disease.   Minimal cholelithiasis.   Aortic Atherosclerosis (ICD10-I70.0).   EKG: I have personally reviewed.  Sinus rhythm, QTc 473, low voltage, nonspecific T wave change   Review of Systems:   General: no fevers, chills, no body weight gain, has poor appetite, has fatigue HEENT: no blurry vision, hearing changes or sore throat Respiratory: no dyspnea, coughing, wheezing CV: has chest pain, no palpitations GI: no nausea, vomiting, abdominal pain, diarrhea, constipation.  Has abdominal distention GU: no dysuria, burning on urination, increased urinary frequency, hematuria  Ext: no leg edema Neuro: no unilateral weakness, numbness, or tingling, no vision change or hearing loss Skin: no rash, no skin tear. MSK: No muscle spasm, no deformity, no limitation of range of movement in spin Heme: No easy bruising.  Travel history: No recent long distant travel.   Allergy: No Known Allergies  Past Medical History:  Diagnosis Date   Atrial fibrillation with rapid ventricular response (HCC)    BPH (benign prostatic hyperplasia)    CVA (cerebral infarction)    GERD (gastroesophageal reflux disease)    History of stroke 05/26/2019   HNP (herniated nucleus pulposus), lumbar 01/09/2015   Hypertension    Hypokalemia    Hypomagnesemia 05/26/2019   Stroke (HCC)    Left foot always feel 2 x larger, walks  with a limp.    Past Surgical History:  Procedure Laterality Date   LUMBAR LAMINECTOMY/DECOMPRESSION MICRODISCECTOMY Right 01/09/2015   Procedure: LUMBAR LAMINECTOMY/DECOMPRESSION MICRODISCECTOMY 1 LEVEL;  Surgeon: Donalee CitrinGary Cram, MD;  Location: [facility];  Service: Neurosurgery;  Laterality: Right;  LUMBAR LAMINECTOMY/DECOMPRESSION MICRODISCECTOMY 1 LEVEL RIGHT LUMBAR 2-3    TONSILLECTOMY      Social History:  reports that he has quit smoking. He has never used smokeless tobacco. He reports that he does not currently use alcohol. He reports that he does not use drugs.  Family History:  Family History  Problem Relation Age of Onset   Pancreatic cancer Mother      Prior to Admission medications   Medication Sig Start Date End Date Taking? Authorizing Provider  apixaban (ELIQUIS) 5 MG TABS tablet Take 1 tablet (5 mg total) by mouth 2 (two) times daily for 30 days. 12/22/18 02/11/20  Sabas SousBero, Michael M, MD  diltiazem (CARDIZEM CD) 240 MG 24 hr capsule Take 1 capsule (240 mg total) by mouth daily. 12/01/20   Fenton, Clint R, PA  omeprazole (PRILOSEC) 40 MG capsule Take 40 mg by mouth daily.    [provider]    Physical Exam: Vitals:   03/20/22 1007 03/20/22 1354 03/20/22 1407 03/20/22 1533  BP: (!) 145/78 138/68  132/61  Pulse: 96 88  88  Resp:  20  16  Temp:   98.2 F (36.8 C) 97.6 F (36.4 C)  TempSrc:   Oral Oral  SpO2: 100% 100%  100%  Weight:      Height:       General: Not in acute distress HEENT:       Eyes: PERRL, EOMI, no scleral icterus.       ENT: No discharge from the ears and nose, no pharynx injection, no tonsillar enlargement.        Neck: No JVD, no bruit, no mass felt. Heme: No neck lymph node enlargement. Cardiac: S1/S2, RRR, No murmurs, No gallops or rubs. Respiratory: No rales, wheezing, rhonchi or rubs. GI: Soft, distended, nontender, no rebound pain, no organomegaly, BS present. GU: No hematuria Ext: No pitting leg edema bilaterally. 1+DP/PT pulse bilaterally. Musculoskeletal: No joint deformities, No joint redness or warmth, no limitation of ROM in spin. Skin: No rashes.  Neuro: Alert, oriented X3, cranial nerves II-XII grossly intact, moves all extremities normally. Psych: Patient is not psychotic, no suicidal or hemocidal ideation.  Labs on Admission: I have personally reviewed following labs and imaging  studies  CBC: Recent Labs  Lab 03/20/22 0710  WBC 4.0  HGB 11.4*  HCT 33.6*  MCV 104.0*  PLT 205   Basic Metabolic Panel: Recent Labs  Lab 03/20/22 0710  NA 130*  K 3.9  CL 98  CO2 25  GLUCOSE 123*  BUN <5*  CREATININE 0.43*  CALCIUM 8.6*   GFR: Estimated Creatinine Clearance: 90.8 mL/min (A) (by C-G formula based on SCr of 0.43 mg/dL (L)). Liver Function Tests: Recent Labs  Lab 03/20/22 0807  AST 35  ALT 17  ALKPHOS 111  BILITOT 2.1*  PROT 6.9  ALBUMIN 3.1*   Recent Labs  Lab 03/20/22 0807  LIPASE 26   Recent Labs  Lab 03/20/22 0901  AMMONIA 17   Coagulation Profile: Recent Labs  Lab 03/20/22 0807  INR 1.4*   Cardiac Enzymes: No results for input(s): "CKTOTAL", "CKMB", "CKMBINDEX", "TROPONINI" in the last 168 hours. BNP (last 3 results) No results for input(s): "PROBNP" in the last 8760 hours.  HbA1C: No results for input(s): "HGBA1C" in the last 72 hours. CBG: No results for input(s): "GLUCAP" in the last 168 hours. Lipid Profile: No results for input(s): "CHOL", "HDL", "LDLCALC", "TRIG", "CHOLHDL", "LDLDIRECT" in the last 72 hours. Thyroid Function Tests: No results for input(s): "TSH", "T4TOTAL", "FREET4", "T3FREE", "THYROIDAB" in the last 72 hours. Anemia Panel: No results for input(s): "VITAMINB12", "FOLATE", "FERRITIN", "TIBC", "IRON", "RETICCTPCT" in the last 72 hours. Urine analysis:    Component Value Date/Time   COLORURINE STRAW (A) 04/05/2019 2000   APPEARANCEUR CLEAR 04/05/2019 2000   LABSPEC 1.002 (L) 04/05/2019 2000   PHURINE 7.0 04/05/2019 2000   GLUCOSEU NEGATIVE 04/05/2019 2000   HGBUR SMALL (A) 04/05/2019 2000   BILIRUBINUR NEGATIVE 04/05/2019 2000   KETONESUR NEGATIVE 04/05/2019 2000   PROTEINUR NEGATIVE 04/05/2019 2000   UROBILINOGEN 0.2 08/03/2011 1937   NITRITE NEGATIVE 04/05/2019 2000   LEUKOCYTESUR NEGATIVE 04/05/2019 2000   Sepsis Labs: @LABRCNTIP (procalcitonin:4,lacticidven:4) )No results found for this or  any previous visit (from the past 240 hour(s)).   Radiological Exams on Admission: Paracentesis  Result Date: 03/20/2022 INDICATION: Patient with history of atrial fibrillation, presented to ED with chest pain. Found to have ascites. Request is for therapeutic and diagnostic paracentesis EXAM: ULTRASOUND GUIDED THERAPEUTIC AND DIAGNOSTIC PARACENTESIS MEDICATIONS: Lidocaine 1% 10 mL COMPLICATIONS: None immediate. PROCEDURE: Informed written consent was obtained from the patient after a discussion of the risks, benefits and alternatives to treatment. A timeout was performed prior to the initiation of the procedure. Initial ultrasound scanning demonstrates a small amount of ascites within the right lower abdominal quadrant. The right lower abdomen was prepped and draped in the usual sterile fashion. 1% lidocaine was used for local anesthesia. Following this, a 6 Fr Safe-T-Centesis catheter was introduced. An ultrasound image was saved for documentation purposes. The paracentesis was performed. The catheter was removed and a dressing was applied. The patient tolerated the procedure well without immediate post procedural complication. FINDINGS: A total of approximately 750 mL of straw-colored fluid was removed. Samples were sent to the laboratory as requested by the clinical team. IMPRESSION: Successful ultrasound-guided therapeutic and diagnostic paracentesis yielding 750 mL of peritoneal fluid. Read by: 03/22/2022, NP Electronically Signed   By: Anders Grant M.D.   On: 03/20/2022 11:07   CT CHEST ABDOMEN PELVIS W CONTRAST  Result Date: 03/20/2022 CLINICAL DATA:  Right-sided rib pain and chest pain after fall 2 days ago. EXAM: CT CHEST, ABDOMEN, AND PELVIS WITH CONTRAST TECHNIQUE: Multidetector CT imaging of the chest, abdomen and pelvis was performed following the standard protocol during bolus administration of intravenous contrast. RADIATION DOSE REDUCTION: This exam was performed according to  the departmental dose-optimization program which includes automated exposure control, adjustment of the mA and/or kV according to patient size and/or use of iterative reconstruction technique. CONTRAST:  03/22/2022 OMNIPAQUE IOHEXOL 300 MG/ML  SOLN COMPARISON:  None Available. FINDINGS: CT CHEST FINDINGS Cardiovascular: Atherosclerosis of thoracic aorta is noted without aneurysm formation. Coronary artery calcifications are noted. Normal cardiac size. No pericardial effusion. Mediastinum/Nodes: No enlarged mediastinal, hilar, or axillary lymph nodes. Thyroid gland, trachea, and esophagus demonstrate no significant findings. Lungs/Pleura: No pneumothorax or pleural effusion is noted. Minimal bibasilar subsegmental atelectasis or scarring is noted. Musculoskeletal: No chest wall mass or suspicious bone lesions identified. CT ABDOMEN PELVIS FINDINGS Hepatobiliary: Minimal cholelithiasis is noted. No biliary dilatation is noted. Liver appears to be small with slightly nodular contours suggesting hepatic cirrhosis. Recanalized umbilical vein is noted. Pancreas: Unremarkable. No pancreatic ductal dilatation  or surrounding inflammatory changes. Spleen: Normal in size without focal abnormality. Adrenals/Urinary Tract: Adrenal glands are unremarkable. Kidneys are normal, without renal calculi, focal lesion, or hydronephrosis. Bladder is unremarkable. Stomach/Bowel: The stomach appears normal. There is no evidence of bowel obstruction or inflammation. Vascular/Lymphatic: Aortic atherosclerosis. No enlarged abdominal or pelvic lymph nodes. Reproductive: Prostate is unremarkable. Other: Moderate ascites is noted.  No hernia is noted. Musculoskeletal: No acute or significant osseous findings. IMPRESSION: No definite evidence of traumatic injury seen in the chest, abdomen or pelvis. Findings concerning for hepatic cirrhosis with recanalized umbilical vein. Moderate ascites is noted. Coronary artery calcifications are noted suggesting  coronary artery disease. Minimal cholelithiasis. Aortic Atherosclerosis (ICD10-I70.0). Electronically Signed   By: Lupita Raider M.D.   On: 03/20/2022 08:54   CT HEAD WO CONTRAST ( )  Result Date: 03/20/2022 CLINICAL DATA:  Head trauma, minor (Age >= 65y) EXAM: CT HEAD WITHOUT CONTRAST TECHNIQUE: Contiguous axial images were obtained from the base of the skull through the vertex without intravenous contrast. RADIATION DOSE REDUCTION: This exam was performed according to the departmental dose-optimization program which includes automated exposure control, adjustment of the mA and/or kV according to patient size and/or use of iterative reconstruction technique. COMPARISON:  June 2020 FINDINGS: Brain: There is no acute intracranial hemorrhage, mass effect, or edema. Gray-white differentiation is preserved. There is no extra-axial fluid collection. Prominence of the ventricles and sulci reflects parenchymal volume loss, which has slightly progressed. Patchy and confluent areas of low-density in the supratentorial white matter nonspecific but probably reflect similar chronic microvascular ischemic changes. Vascular: There is mild atherosclerotic calcification at the skull base. Skull: Calvarium is unremarkable. Sinuses/Orbits: No acute finding. Other: None. IMPRESSION: No evidence of acute intracranial injury. Chronic/nonemergent findings detailed above. Electronically Signed   By: Guadlupe Spanish M.D.   On: 03/20/2022 08:41   DG Chest 2 View  Result Date: 03/20/2022 CLINICAL DATA:  66 year old male with history of chest pain for the past 2 days. EXAM: CHEST - 2 VIEW COMPARISON:  Chest x-ray 05/25/2019. FINDINGS: Lung volumes are low. Linear areas of architectural distortion are noted in the medial left lower lobe, favored to reflect areas of scarring or atelectasis. No consolidative airspace disease. Trace bilateral pleural effusions. No pneumothorax. No pulmonary nodule or mass noted. Pulmonary vasculature  and the cardiomediastinal silhouette are within normal limits. Atherosclerosis in the thoracic aorta. IMPRESSION: 1. Low lung volumes with scarring or atelectasis in the medial left lower lobe. 2. Trace bilateral pleural effusions. 3. Aortic atherosclerosis. Electronically Signed   By: Trudie Reed M.D.   On: 03/20/2022 07:47      Assessment/Plan Principal Problem:   Cirrhosis of liver with ascites (HCC) Active Problems:   Hypertension   History of stroke   Atrial fibrillation, chronic (HCC)   Fall at home, initial encounter   Chest pain    Assessment and Plan: * Cirrhosis of liver with ascites (HCC) This is most likely due to alcohol abuse.  Patient stopped drinking alcohol 2 weeks ago. Paracentesis was done by IR, initial fluid analysis showed PMN 215.  Patient does not have abdominal pain.  Does not seem to have SBP.  Patient had ultrasound on 11/29/2021 which showed: 1. Significant ascites. 2. Cholelithiasis and sludge in the gallbladder without wall thickening or Murphy's sign. 3. Diffuse increased echogenicity in the liver is nonspecific but often seen with hepatic steatosis or hepatocellular disease.  -place telemetry bed for observation -Paracentesis was performed, follow-up fluid analysis -25 gram of albumin is  ordered -Check ammonia level --> 17 -Check hepatitis panel and HIV antibody -Patient is taking spironolactone  Hypertension - IV hydralazine as needed -Continue Cardizem  Chest pain Etiology is not clear, very atypical chest pain.  Troponin negative x2.  No shortness of breath.  Patient is taking Eliquis, low suspicions for PE -As needed Percocet and Tylenol for pain -check A1c and FLP  Fall at home, initial encounter CT head negative.  CT scanning of chest/abdomen/pelvis negative for acute injury - Fall precaution  Atrial fibrillation, chronic (HCC) Heart rate 104, 97 - Continue Eliquis and Cardizem  History of stroke - Patient is on Eliquis for  A-fib             DVT ppx: on Eliquis  Code Status: Full code  Family Communication: not done, no family member is at bed side.      Disposition Plan:  Anticipate discharge back to previous environment  Consults called:  none  Admission status and Level of care: Telemetry Medical:   for obs     Severity of Illness:  The appropriate patient status for this patient is OBSERVATION. Observation status is judged to be reasonable and necessary in order to provide the required intensity of service to ensure the patient's safety. The patient's presenting symptoms, physical exam findings, and initial radiographic and laboratory data in the context of their medical condition is felt to place them at decreased risk for further clinical deterioration. Furthermore, it is anticipated that the patient will be medically stable for discharge from the hospital within 2 midnights of admission.        Date of Service 03/20/2022    Lorretta Harp [facility]   If 7PM-7AM, please contact night-coverage [email or web address] 03/20/2022, 5:50 PM

## 2022-03-20 NOTE — Assessment & Plan Note (Signed)
-   Patient is on Eliquis for A-fib

## 2022-03-20 NOTE — ED Notes (Signed)
Pt in ultrasound at this time

## 2022-03-20 NOTE — Assessment & Plan Note (Signed)
Heart rate 104, 97 - Continue Eliquis and Cardizem

## 2022-03-20 NOTE — Progress Notes (Signed)
Admission profile updated. ?

## 2022-03-21 DIAGNOSIS — K7460 Unspecified cirrhosis of liver: Secondary | ICD-10-CM | POA: Diagnosis not present

## 2022-03-21 DIAGNOSIS — I482 Chronic atrial fibrillation, unspecified: Secondary | ICD-10-CM | POA: Diagnosis not present

## 2022-03-21 DIAGNOSIS — Z8673 Personal history of transient ischemic attack (TIA), and cerebral infarction without residual deficits: Secondary | ICD-10-CM | POA: Diagnosis not present

## 2022-03-21 DIAGNOSIS — W19XXXA Unspecified fall, initial encounter: Secondary | ICD-10-CM | POA: Diagnosis not present

## 2022-03-21 DIAGNOSIS — I10 Essential (primary) hypertension: Secondary | ICD-10-CM

## 2022-03-21 DIAGNOSIS — R188 Other ascites: Secondary | ICD-10-CM | POA: Diagnosis not present

## 2022-03-21 LAB — BASIC METABOLIC PANEL
Anion gap: 4 — ABNORMAL LOW (ref 5–15)
BUN: <5 mg/dL — ABNORMAL LOW (ref 8–23)
CO2: 27 mmol/L (ref 22–32)
Calcium: 8.4 mg/dL — ABNORMAL LOW (ref 8.9–10.3)
Chloride: 101 mmol/L (ref 98–111)
Creatinine, Ser: 0.32 mg/dL — ABNORMAL LOW (ref 0.61–1.24)
GFR, Estimated: >60 mL/min (ref >60)
Glucose, Bld: 102 mg/dL — ABNORMAL HIGH (ref 70–99)
Potassium: 3.8 mmol/L (ref 3.5–5.1)
Sodium: 132 mmol/L — ABNORMAL LOW (ref 135–145)

## 2022-03-21 LAB — LIPID PANEL
Cholesterol: 83 mg/dL (ref 0–200)
HDL: 47 mg/dL (ref >40)
LDL Cholesterol: 28 mg/dL (ref 0–99)
Total CHOL/HDL Ratio: 1.8 RATIO
Triglycerides: 41 mg/dL (ref <150)
VLDL: 8 mg/dL (ref 0–40)

## 2022-03-21 LAB — PROTEIN, BODY FLUID (OTHER): Total Protein, Body Fluid Other: 1.8 g/dL

## 2022-03-21 LAB — HEMOGLOBIN A1C
Hgb A1c MFr Bld: 4.1 % — ABNORMAL LOW (ref 4.8–5.6)
Mean Plasma Glucose: 70.97 mg/dL

## 2022-03-21 LAB — CYTOLOGY - NON PAP

## 2022-03-21 MED ORDER — FUROSEMIDE 40 MG PO TABS: 40.0000 mg | ORAL_TABLET | Freq: Every day | ORAL | 0 refills | Status: DC

## 2022-03-21 MED ORDER — ORAL CARE MOUTH RINSE: 15.0000 mL | OROMUCOSAL | Status: DC | PRN

## 2022-03-21 NOTE — Progress Notes (Signed)
   03/21/22 0700  Clinical Encounter Type  Visited With Patient  Visit Type Initial  Referral From Nurse  Consult/Referral To Chaplain   Chaplain responded to nurse consult. Patient just admitted to unit. Chaplain provided compassionate presence and reflective listening while patient spoke about health journey. Chaplain explained AD paperwork and left documents with patient and instructed to have nurse page Chaplain on call for completion. Patient appreciated Chaplain visit.  

## 2022-03-21 NOTE — TOC Initial Note (Signed)
Transition of Care [facility]) - Initial/Assessment Note    Patient Details  Name: Jeffrey Randolph MRN: [id number] Date of Birth: 1956/08/04  [facility] CM/SW Contact:    Chapman Fitch, RN Phone Number: 03/21/2022, 10:14 AM  Clinical Narrative:                 Admitted for: cirrhosis Admitted from: home alone [facility] Pharmacy: [place] Current home health/prior home health/DME: Will borrow a friends cane at discharge  Friend at bedside and transport at discharge Patient agreeable to home health PT and OT.  Patient states he does not have a preference of home health agency.  Referral made and accepted by [facility] with [facility]    Expected Discharge Plan: Home w Home Health Services Barriers to Discharge: Barriers Resolved   Patient Goals and CMS Choice     Choice offered to / list presented to : Patient  Expected Discharge Plan and Services Expected Discharge Plan: Home w Home Health Services     Post Acute Care Choice: Home Health Living arrangements for the past 2 months: Single Family Home Expected Discharge Date: 03/21/22                         HH Arranged: PT, OT HH Agency: [facility] Date [facility] Agency Contacted: 03/21/22   Representative spoke with at [facility] Agency: Kandee Keen  Prior Living Arrangements/Services Living arrangements for the past 2 months: Single Family Home Lives with:: Self Patient language and need for interpreter reviewed:: Yes Do you feel safe going back to the place where you live?: Yes      Need for Family Participation in Patient Care: No (Comment) Care giver support system in place?: Yes (comment)   Criminal Activity/Legal Involvement Pertinent to Current Situation/Hospitalization: No - Comment as needed  Activities of Daily Living Home Assistive Devices/Equipment: None ADL Screening (condition at time of admission) Patient's cognitive ability adequate to safely complete daily activities?: Yes Is the patient deaf or have  difficulty hearing?: No Does the patient have difficulty seeing, even when wearing glasses/contacts?: No Does the patient have difficulty concentrating, remembering, or making decisions?: No Patient able to express need for assistance with ADLs?: Yes Does the patient have difficulty dressing or bathing?: No Independently performs ADLs?: Yes (appropriate for developmental age) Does the patient have difficulty walking or climbing stairs?: No Weakness of Legs: None Weakness of Arms/Hands: None  Permission Sought/Granted                  Emotional Assessment       Orientation: : Oriented to Self, Oriented to Situation, Oriented to Place, Oriented to  Time Alcohol / Substance Use: Not Applicable Psych Involvement: No (comment)  Admission diagnosis:  Syncope and collapse [R55] Ascitic fluid [R18.8] Other ascites [R18.8] Cirrhosis of liver with ascites (HCC) [K74.60, R18.8] Patient Active Problem List   Diagnosis Date Noted   Ascitic fluid 03/20/2022   Cirrhosis of liver with ascites (HCC) 03/20/2022   Atrial fibrillation, chronic (HCC) 03/20/2022   Fall at home, initial encounter 03/20/2022   Chest pain 03/20/2022   Secondary hypercoagulable state (HCC) 10/20/2019   Hypertension 05/26/2019   Hypokalemia 05/26/2019   History of stroke 05/26/2019   Hypomagnesemia 05/26/2019   Atrial fibrillation (HCC) 05/25/2019   HNP (herniated nucleus pulposus), lumbar 01/09/2015   PCP:  Lonie Peak, PA-C Pharmacy:   [facility] - [place], [place] - [address]  [place] [address] Phone: [phone] Fax: [phone]     Social Determinants of Health (SDOH) Interventions    Readmission Risk Interventions     No data to display

## 2022-03-21 NOTE — Progress Notes (Signed)
Pt discharged home in stable condition. Discharge instructions given. Scripts sent to pharmacy of choice. No immediate questions or concerns. DC'd from unit via wheelchair.

## 2022-03-21 NOTE — Evaluation (Signed)
Physical Therapy Evaluation Patient Details Name: Jeffrey Randolph MRN: [id number] DOB: 1956/07/17 Today's Date: 03/21/2022  History of Present Illness  Pt is a 66 yo male that presented to the ED for abdominal pain, chest pain, weakness. Workup showed significant ascites, cholelithiasis. PMH of HTN afib, CVA, GERD, etoh abuse.   Clinical Impression  Patient alert, agreeable to PT, eager to go home. The patient reported at baseline he is independent with mobility, ADLs. Does not drive so his friends assists with the grocery store, bringing him to appts, etc. Pt reported "a few" falls at home, states he just loses his balance or trips over his cats.   He demonstrated BLE MMT WFLs. He was able to transfer in/out of bed and sit <> stand several times, independently. He ambulated ~150ft with supervision - modI. Some gait path deviations noted, pt able to self correct and does intermittently reach for SUE. Pt educated on potential use of SPC to decrease this, agreeable to attempt to use a SPC that his friend has. Overall the patient demonstrated near return to baseline level of functioning, but would benefit from HHPT to maximize safety, balance and decrease risk of falls.        Recommendations for follow up therapy are one component of a multi-disciplinary discharge planning process, led by the attending physician.  Recommendations may be updated based on patient status, additional functional criteria and insurance authorization.  Follow Up Recommendations Home health PT    Assistance Recommended at Discharge Intermittent Supervision/Assistance  Patient can return home with the following  Assistance with cooking/housework;Assist for transportation    Equipment Recommendations Cane (Pt to borrow [facility] from friend)  Recommendations for Other Services       Functional Status Assessment Patient has had a recent decline in their functional status and demonstrates the ability to make significant  improvements in function in a reasonable and predictable amount of time.     Precautions / Restrictions Precautions Precautions: Fall Restrictions Weight Bearing Restrictions: No      Mobility  Bed Mobility Overal bed mobility: Independent                  Transfers Overall transfer level: Independent Equipment used: None                    Ambulation/Gait Ambulation/Gait assistance: Supervision, Modified independent (Device/Increase time) Gait Distance (Feet): 170 Feet Assistive device: None         General Gait Details: pt did occasionally reach for single UE support while walking in the hallway. some gait path deviations noted but pt self corrects.  Stairs            Wheelchair Mobility    Modified Rankin (Stroke Patients Only)       Balance Overall balance assessment: Needs assistance Sitting-balance support: Feet supported Sitting balance-Leahy Scale: Good       Standing balance-Leahy Scale: Good   Single Leg Stance - Right Leg: 3 Single Leg Stance - Left Leg: 2     Rhomberg - Eyes Opened: 10 Rhomberg - Eyes Closed: 5                 Pertinent Vitals/Pain Pain Assessment Pain Assessment: No/denies pain    Home Living Family/patient expects to be discharged to:: Private residence Living Arrangements: Alone Available Help at Discharge: Friend(s);Available PRN/intermittently Type of Home: House Home Access: Stairs to enter   Entrance Stairs-Number of Steps: threshold   Home Layout: One level Home  Equipment: Agricultural consultant (2 wheels) Additional Comments: pt endorsed "a few" falls    Prior Function Prior Level of Function : Needs assist             Mobility Comments: independent for mobility, pt does not drive so friends assist with driving, groceries, etc ADLs Comments: independent     Hand Dominance        Extremity/Trunk Assessment   Upper Extremity Assessment Upper Extremity Assessment: Overall  WFL for tasks assessed    Lower Extremity Assessment Lower Extremity Assessment: Overall WFL for tasks assessed (grossly 4+/5 bilaterally)    Cervical / Trunk Assessment Cervical / Trunk Assessment: Normal  Communication   Communication: No difficulties  Cognition Arousal/Alertness: Awake/alert Behavior During Therapy: WFL for tasks assessed/performed Overall Cognitive Status: Within Functional Limits for tasks assessed                                          General Comments      Exercises     Assessment/Plan    PT Assessment Patient needs continued PT services  PT Problem List Decreased balance;Decreased mobility;Decreased knowledge of use of DME       PT Treatment Interventions DME instruction;Therapeutic exercise;Gait training;Balance training;Stair training;Neuromuscular re-education;Functional mobility training;Therapeutic activities;Patient/family education    PT Goals (Current goals can be found in the Care Plan section)  Acute Rehab PT Goals Patient Stated Goal: to go home PT Goal Formulation: With patient Time For Goal Achievement: 04/04/22 Potential to Achieve Goals: Good Additional Goals Additional Goal #1: The patient will demonstrate a score of at least 53 or higher on the BERG Balance Assessment to indicate a decreased risk of falls.    Frequency Min 2X/week     Co-evaluation               AM-PAC PT "6 Clicks" Mobility  Outcome Measure Help needed turning from your back to your side while in a flat bed without using bedrails?: None Help needed moving from lying on your back to sitting on the side of a flat bed without using bedrails?: None Help needed moving to and from a bed to a chair (including a wheelchair)?: None Help needed standing up from a chair using your arms (e.g., wheelchair or bedside chair)?: None Help needed to walk in hospital room?: None Help needed climbing 3-5 steps with a railing? : A Little 6 Click  Score: 23    End of Session   Activity Tolerance: Patient tolerated treatment well Patient left: in bed;with call bell/phone within reach;with family/visitor present Nurse Communication: Mobility status PT Visit Diagnosis: Other abnormalities of gait and mobility (R26.89);Difficulty in walking, not elsewhere classified (R26.2)    Time: 7253-6644 PT Time Calculation (min) (ACUTE ONLY): 10 min   Charges:   PT Evaluation $PT Eval Low Complexity: 1 Low          Olga Coaster PT, DPT 10:01 AM,03/21/22

## 2022-03-21 NOTE — Discharge Summary (Signed)
Physician Discharge Summary   Patient: Jeffrey Randolph MRN: [id number] DOB: 07/20/1956  Admit date:     03/20/2022  Discharge date: 03/21/22  Discharge Physician: Tyrone Nine   PCP: Lonie Peak, PA-C   Recommendations at discharge:  Follow up cytology pending from paracentesis performed 6/14 at discharge.  Repeat CBC, CMP, PT/INR recommended at follow up. Added lasix 40mg  daily to spironolactone (100mg  total daily dose).  Recommend GI outpatient evaluation for hepatic cirrhosis, presumed to be alcoholic. Pt has never had colonoscopy or EGD/variceal screening.  Discharge Diagnoses: Principal Problem:   Cirrhosis of liver with ascites (HCC) Active Problems:   Hypertension   History of stroke   Atrial fibrillation, chronic (HCC)   Fall at home, initial encounter   Chest pain  Hospital Course: Jeffrey Randolph is a 66 y.o. male with medical history significant of hypertension, stroke, GERD, BPH, atrial fibrillation on Eliquis,` alcohol abuse in remission (stopped drinking alcohol 2 weeks ago), who presents with abdominal distention and chest pain.   Patient states that he noticed that his abdomen is distended recently which has been progressively worsening.  Patient does not have nausea, vomiting, diarrhea or abdominal pain.  Patient states he had small amount of loose stool earlier, but no active diarrhea.  He also reports chest pain which is located in the right lower chest, sharp, mild to moderate, nonradiating.  Denies cough, shortness of breath.  No symptoms of UTI. Patient states that he used to drink beers several times per week, 4-5 beers each time for more than 20 years.  He stopped drinking alcohol 2 weeks ago.  He does not have symptoms of withdrawal.    Pt states that he has generalized weakness, poor appetite and decreased oral intake.  He states that 2 days ago, he accidentally fell when he was walking to the back door and tripped his steps.  No loss of consciousness.  Denies head or  neck injury.     Data Reviewed and ED Course: pt was found to have WBC 4.0, INR 1.4, PTT 34, ammonia 17, troponin level 3, 2, lipase 26, liver function normal except for total bilirubin 2.3, temperature normal, blood pressure 126/61, heart rate of 104, 97, RR 18, oxygen saturation 97% on room air.  CT of head is negative for acute intracranial abnormalities.  Chest x-ray is negative for infiltration. Pt is place in tele bed for obs.   CT angiogram of chest/abdomen/pelvis: No definite evidence of traumatic injury seen in the chest, abdomen or pelvis.   Findings concerning for hepatic cirrhosis with recanalized umbilical vein. Moderate ascites is noted.   Coronary artery calcifications are noted suggesting coronary artery disease.   Minimal cholelithiasis.   Aortic Atherosclerosis (ICD10-I70.0).  Hospital Course: Paracentesis performed 6/14 yielding 750cc straw colored fluid with undetectable albumin consistent with portal HTN as etiology, no evidence of SBP. His symptoms have significantly improved, including early satiety. He has walked around the unit and is stable for discharge.   Assessment and Plan: * Cirrhosis of liver with ascites (HCC) This is most likely due to alcohol abuse.  Patient stopped drinking alcohol 2 weeks ago. Paracentesis was done by IR, initial fluid analysis showed PMN 215.  Patient does not have abdominal pain.  Does not seem to have SBP.  Patient had ultrasound on 11/29/2021 which showed: 1. Significant ascites. 2. Cholelithiasis and sludge in the gallbladder without wall thickening or Murphy's sign. 3. Diffuse increased echogenicity in the liver is nonspecific but often seen with hepatic  steatosis or hepatocellular disease.  Hepatitis panel and HIV negative/NR.  - GI evaluation as outpatient recommended.  - Start lasix 40mg  daily, continue home spironolactone.  Hypertension - Continue home med  Chest pain Etiology is not clear, very atypical chest pain,  likely due to ascites.  Troponin negative x2.  No shortness of breath.  Patient is taking Eliquis, low suspicions for PE. Resolved without directed treatment. ACS is ruled out.   Fall at home, initial encounter CT head negative.  CT scanning of chest/abdomen/pelvis negative for acute injury - Fall precautions, ambulating well on day of discharge.  Atrial fibrillation, chronic (HCC) - Continue Eliquis and Cardizem  History of stroke - Patient is on Eliquis for A-fib  Consultants: None Procedures performed: Paracentesis 6/14  Disposition: Home Diet recommendation: Sodium restriction  DISCHARGE MEDICATION: Allergies as of 03/21/2022   No Known Allergies      Medication List     TAKE these medications    apixaban 5 MG Tabs tablet Commonly known as: ELIQUIS Take 1 tablet (5 mg total) by mouth 2 (two) times daily for 30 days.   diltiazem 240 MG 24 hr capsule Commonly known as: CARDIZEM CD Take 1 capsule (240 mg total) by mouth daily.   furosemide 40 MG tablet Commonly known as: Lasix Take 1 tablet (40 mg total) by mouth daily.   omeprazole 40 MG capsule Commonly known as: PRILOSEC Take 40 mg by mouth daily.   pregabalin 100 MG capsule Commonly known as: LYRICA Take 100 mg by mouth 3 (three) times daily.   spironolactone 50 MG tablet Commonly known as: ALDACTONE Take 50 mg by mouth 2 (two) times daily.        Follow-up Information     03/23/2022, PA-C. Schedule an appointment as soon as possible for a visit in 1 week(s).   Specialty: Physician Assistant Contact information: [address] [place] [phone]                Discharge Exam: [phone] Weights   03/20/22 0705 03/21/22 0351  Weight: 82.1 kg 73 kg  BP (!) 113/53 (BP Location: Left Arm)   Pulse 66   Temp 97.8 F (36.6 C) (Oral)   Resp 16   Ht 5\' 9"  (1.753 m)   Wt 73 kg   SpO2 99%   BMI 23.77 kg/m   Chronically ill-appearing, frail male in no distress Irreg irreg  with rate in 70's, trace LE edema symmetrically Clear, nonlabored Soft, nondistended, +BS and nontender. Right paracentesis site with bandaid without active bleeding or discharge or erythema.   Condition at discharge: stable  The results of significant diagnostics from this hospitalization (including imaging, microbiology, ancillary and laboratory) are listed below for reference.   Imaging Studies: 03/23/22 Paracentesis  Result Date: 03/20/2022 INDICATION: Patient with history of atrial fibrillation, presented to ED with chest pain. Found to have ascites. Request is for therapeutic and diagnostic paracentesis EXAM: ULTRASOUND GUIDED THERAPEUTIC AND DIAGNOSTIC PARACENTESIS MEDICATIONS: Lidocaine 1% 10 mL COMPLICATIONS: None immediate. PROCEDURE: Informed written consent was obtained from the patient after a discussion of the risks, benefits and alternatives to treatment. A timeout was performed prior to the initiation of the procedure. Initial ultrasound scanning demonstrates a small amount of ascites within the right lower abdominal quadrant. The right lower abdomen was prepped and draped in the usual sterile fashion. 1% lidocaine was used for local anesthesia. Following this, a 6 Fr Safe-T-Centesis catheter was introduced. An ultrasound image was saved for  documentation purposes. The paracentesis was performed. The catheter was removed and a dressing was applied. The patient tolerated the procedure well without immediate post procedural complication. FINDINGS: A total of approximately 750 mL of straw-colored fluid was removed. Samples were sent to the laboratory as requested by the clinical team. IMPRESSION: Successful ultrasound-guided therapeutic and diagnostic paracentesis yielding 750 mL of peritoneal fluid. Read by: Anders Grant, NP Electronically Signed   By: Olive Bass M.D.   On: 03/20/2022 11:07   CT CHEST ABDOMEN PELVIS W CONTRAST  Result Date: 03/20/2022 CLINICAL DATA:  Right-sided rib  pain and chest pain after fall 2 days ago. EXAM: CT CHEST, ABDOMEN, AND PELVIS WITH CONTRAST TECHNIQUE: Multidetector CT imaging of the chest, abdomen and pelvis was performed following the standard protocol during bolus administration of intravenous contrast. RADIATION DOSE REDUCTION: This exam was performed according to the departmental dose-optimization program which includes automated exposure control, adjustment of the mA and/or kV according to patient size and/or use of iterative reconstruction technique. CONTRAST:  OMNIPAQUE IOHEXOL 300 MG/ML  SOLN COMPARISON:  None Available. FINDINGS: CT CHEST FINDINGS Cardiovascular: Atherosclerosis of thoracic aorta is noted without aneurysm formation. Coronary artery calcifications are noted. Normal cardiac size. No pericardial effusion. Mediastinum/Nodes: No enlarged mediastinal, hilar, or axillary lymph nodes. Thyroid gland, trachea, and esophagus demonstrate no significant findings. Lungs/Pleura: No pneumothorax or pleural effusion is noted. Minimal bibasilar subsegmental atelectasis or scarring is noted. Musculoskeletal: No chest wall mass or suspicious bone lesions identified. CT ABDOMEN PELVIS FINDINGS Hepatobiliary: Minimal cholelithiasis is noted. No biliary dilatation is noted. Liver appears to be small with slightly nodular contours suggesting hepatic cirrhosis. Recanalized umbilical vein is noted. Pancreas: Unremarkable. No pancreatic ductal dilatation or surrounding inflammatory changes. Spleen: Normal in size without focal abnormality. Adrenals/Urinary Tract: Adrenal glands are unremarkable. Kidneys are normal, without renal calculi, focal lesion, or hydronephrosis. Bladder is unremarkable. Stomach/Bowel: The stomach appears normal. There is no evidence of bowel obstruction or inflammation. Vascular/Lymphatic: Aortic atherosclerosis. No enlarged abdominal or pelvic lymph nodes. Reproductive: Prostate is unremarkable. Other: Moderate ascites is noted.   No hernia is noted. Musculoskeletal: No acute or significant osseous findings. IMPRESSION: No definite evidence of traumatic injury seen in the chest, abdomen or pelvis. Findings concerning for hepatic cirrhosis with recanalized umbilical vein. Moderate ascites is noted. Coronary artery calcifications are noted suggesting coronary artery disease. Minimal cholelithiasis. Aortic Atherosclerosis (ICD10-I70.0). Electronically Signed   By: Lupita Raider M.D.   On: 03/20/2022 08:54   CT HEAD WO CONTRAST ( )  Result Date: 03/20/2022 CLINICAL DATA:  Head trauma, minor (Age >= 65y) EXAM: CT HEAD WITHOUT CONTRAST TECHNIQUE: Contiguous axial images were obtained from the base of the skull through the vertex without intravenous contrast. RADIATION DOSE REDUCTION: This exam was performed according to the departmental dose-optimization program which includes automated exposure control, adjustment of the mA and/or kV according to patient size and/or use of iterative reconstruction technique. COMPARISON:  June 2020 FINDINGS: Brain: There is no acute intracranial hemorrhage, mass effect, or edema. Gray-white differentiation is preserved. There is no extra-axial fluid collection. Prominence of the ventricles and sulci reflects parenchymal volume loss, which has slightly progressed. Patchy and confluent areas of low-density in the supratentorial white matter nonspecific but probably reflect similar chronic microvascular ischemic changes. Vascular: There is mild atherosclerotic calcification at the skull base. Skull: Calvarium is unremarkable. Sinuses/Orbits: No acute finding. Other: None. IMPRESSION: No evidence of acute intracranial injury. Chronic/nonemergent findings detailed above. Electronically Signed   By: Guadlupe Spanish  M.D.   On: 03/20/2022 08:41   DG Chest 2 View  Result Date: 03/20/2022 CLINICAL DATA:  66 year old male with history of chest pain for the past 2 days. EXAM: CHEST - 2 VIEW COMPARISON:  Chest x-ray  05/25/2019. FINDINGS: Lung volumes are low. Linear areas of architectural distortion are noted in the medial left lower lobe, favored to reflect areas of scarring or atelectasis. No consolidative airspace disease. Trace bilateral pleural effusions. No pneumothorax. No pulmonary nodule or mass noted. Pulmonary vasculature and the cardiomediastinal silhouette are within normal limits. Atherosclerosis in the thoracic aorta. IMPRESSION: 1. Low lung volumes with scarring or atelectasis in the medial left lower lobe. 2. Trace bilateral pleural effusions. 3. Aortic atherosclerosis. Electronically Signed   By: Trudie Reed M.D.   On: 03/20/2022 07:47   US Abdomen Limited RUQ (LIVER/GB)  Result Date: 02/26/2022 CLINICAL DATA:  Elevated LFTs EXAM: ULTRASOUND ABDOMEN LIMITED RIGHT UPPER QUADRANT COMPARISON:  None Available. FINDINGS: Gallbladder: The gallbladder contains significant sludge with a few small stones. No wall thickening or Murphy's sign. Common bile duct: Diameter: 2.3 mm Liver: Diffuse increased echogenicity. No mass. Portal vein is patent on color Doppler imaging with normal direction of blood flow towards the liver. Other: Significant ascites. IMPRESSION: 1. Significant ascites. 2. Cholelithiasis and sludge in the gallbladder without wall thickening or Murphy's sign. 3. Diffuse increased echogenicity in the liver is nonspecific but often seen with hepatic steatosis or hepatocellular disease. Electronically Signed   By: Gerome Sam III M.D.   On: 02/26/2022 09:37    Microbiology: Results for orders placed or performed during the hospital encounter of 03/20/22  Body fluid culture w Gram Stain     Status: None (Preliminary result)   Collection Time: 03/20/22 10:30 AM   Specimen: PATH Cytology Peritoneal fluid  Result Value Ref Range Status   Specimen Description   Final    PERITONEAL Performed at [facility], [address] [place], [place] [address]    Special Requests   Final     NONE Performed at [facility], [address] [place], [place] [address]    Gram Stain   Final    WBC PRESENT,BOTH PMN AND MONONUCLEAR NO ORGANISMS SEEN CYTOSPIN SMEAR Performed at [facility], [address], [place], [place] [address]    Culture PENDING  Incomplete   Report Status PENDING  Incomplete    Labs: CBC: Recent Labs  Lab 03/20/22 0710  WBC 4.0  HGB 11.4*  HCT 33.6*  MCV 104.0*  PLT 205   Basic Metabolic Panel: Recent Labs  Lab 03/20/22 0710 03/21/22 0453  NA 130* 132*  K 3.9 3.8  CL 98 101  CO2 25 27  GLUCOSE 123* 102*  BUN <5* <5*  CREATININE 0.43* 0.32*  CALCIUM 8.6* 8.4*   Liver Function Tests: Recent Labs  Lab 03/20/22 0807  AST 35  ALT 17  ALKPHOS 111  BILITOT 2.1*  PROT 6.9  ALBUMIN 3.1*   CBG: No results for input(s): "GLUCAP" in the last 168 hours.  Discharge time spent: greater than 30 minutes.  Signed: Tyrone Nine, MD [facility] 03/21/2022

## 2022-03-23 LAB — LIPASE, FLUID: Lipase-Fluid: 12 U/L

## 2022-03-25 LAB — BODY FLUID CULTURE W GRAM STAIN: Culture: NO GROWTH

## 2022-08-01 ENCOUNTER — Ambulatory Visit: Payer: Medicare Other | Admitting: Gastroenterology

## 2022-08-01 ENCOUNTER — Other Ambulatory Visit: Payer: Self-pay

## 2022-08-01 NOTE — Progress Notes (Deleted)
Wyline Mood MD, MRCP(U.K) [address]  [address]  [place], [place] [address]  Main: [phone]  Fax: [phone]   Gastroenterology Consultation  Referring Provider:     Lonie Peak, PA-C Primary Care Physician:  Lonie Peak, PA-C Primary Gastroenterologist:  Dr. Wyline Mood  Reason for Consultation:    GERD        HPI:   Jeffrey Randolph is a 66 y.o. y/o male referred for consultation & management  by Lonie Peak, PA-C.      Reflux:  Onset : *** Symptoms: *** Recent weight gain: *** Medications: *** Narcotics or anticholinergics use : *** PPI /H2 blockers or Antacid  use and timing :*** Dinner time : ***  Prior EGD: *** Family history of esophageal cancer:***       Past Medical History:  Diagnosis Date   Atrial fibrillation with rapid ventricular response (HCC)    BPH (benign prostatic hyperplasia)    CVA (cerebral infarction)    GERD (gastroesophageal reflux disease)    History of stroke 05/26/2019   HNP (herniated nucleus pulposus), lumbar 01/09/2015   Hypertension    Hypokalemia    Hypomagnesemia 05/26/2019   Stroke (HCC)    Left foot always feel 2 x larger, walks with a limp.    Past Surgical History:  Procedure Laterality Date   LUMBAR LAMINECTOMY/DECOMPRESSION MICRODISCECTOMY Right 01/09/2015   Procedure: LUMBAR LAMINECTOMY/DECOMPRESSION MICRODISCECTOMY 1 LEVEL;  Surgeon: Donalee Citrin, MD;  Location: [facility];  Service: Neurosurgery;  Laterality: Right;  LUMBAR LAMINECTOMY/DECOMPRESSION MICRODISCECTOMY 1 LEVEL RIGHT LUMBAR 2-3   TONSILLECTOMY      Prior to Admission medications   Medication Sig Start Date End Date Taking? Authorizing Provider  apixaban (ELIQUIS) 5 MG TABS tablet Take 1 tablet (5 mg total) by mouth 2 (two) times daily for 30 days. 12/22/18 03/20/22  Sabas Sous, MD  diltiazem (CARDIZEM CD) 240 MG 24 hr capsule Take 1 capsule (240 mg total) by mouth daily. 12/01/20   Fenton, Clint R, PA  furosemide (LASIX) 40 MG tablet  Take 1 tablet (40 mg total) by mouth daily. 03/21/22   Tyrone Nine, MD  omeprazole (PRILOSEC) 40 MG capsule Take 40 mg by mouth daily.    [provider]  pregabalin (LYRICA) 150 MG capsule Take 150 mg by mouth 3 (three) times daily. 06/13/22   [provider]  spironolactone (ALDACTONE) 50 MG tablet Take 50 mg by mouth 2 (two) times daily. 03/06/22   [provider]    Family History  Problem Relation Age of Onset   Pancreatic cancer Mother      Social History   Tobacco Use   Smoking status: Former    Years: 3.00    Types: Cigarettes   Smokeless tobacco: Never   Tobacco comments:    quit smoking in 1980's  Substance Use Topics   Alcohol use: Not Currently   Drug use: No    Allergies as of 08/01/2022   (No Known Allergies)    Review of Systems:    All systems reviewed and negative except where noted in HPI.   Physical Exam:  There were no vitals taken for this visit. No LMP for male patient. Psych:  Alert and cooperative. Normal mood and affect. General:   Alert,  Well-developed, well-nourished, pleasant and cooperative in NAD Head:  Normocephalic and atraumatic. Eyes:  Sclera clear, no icterus.   Conjunctiva pink. Ears:  Normal auditory acuity. Neck:  Supple; no masses or thyromegaly. Lungs:  Respirations even and unlabored.  Clear throughout to auscultation.   No wheezes, crackles, or rhonchi. No acute distress. Heart:  Regular rate and rhythm; no murmurs, clicks, rubs, or gallops. Abdomen:  Normal bowel sounds.  No bruits.  Soft, non-tender and non-distended without masses, hepatosplenomegaly or hernias noted.  No guarding or rebound tenderness.    Neurologic:  Alert and oriented x3;  grossly normal neurologically. Psych:  Alert and cooperative. Normal mood and affect.  Imaging Studies: No results found.  Assessment and Plan:   Jeffrey Randolph is a 66 y.o. y/o male has been referred for GERD :     Plan  GERD:  Counseled on life style  changes, suggest to use PPI first thing in the morning on empty stomach and eat 30 minutes after. Advised on the use of a wedge pillow at night , avoid meals for 2 hours prior to bed time. Weight loss ***.Discussed the risks and benefits of long term PPI use including but not limited to bone loss, chronic kidney disease, infections , low magnesium . Aim to use at the lowest dose for the shortest period of time    Follow up in ***  Dr Jonathon Bellows MD,MRCP(U.K)

## 2022-11-12 ENCOUNTER — Other Ambulatory Visit: Payer: Self-pay

## 2022-11-12 ENCOUNTER — Encounter: Payer: Self-pay | Admitting: Gastroenterology

## 2022-11-12 ENCOUNTER — Ambulatory Visit (INDEPENDENT_AMBULATORY_CARE_PROVIDER_SITE_OTHER): Payer: 59 | Admitting: Gastroenterology

## 2022-11-12 VITALS — BP 154/74 | HR 93 | Temp 97.4°F | Wt 175.4 lb

## 2022-11-12 DIAGNOSIS — K7460 Unspecified cirrhosis of liver: Secondary | ICD-10-CM | POA: Diagnosis not present

## 2022-11-12 DIAGNOSIS — R188 Other ascites: Secondary | ICD-10-CM

## 2022-11-12 DIAGNOSIS — I8500 Esophageal varices without bleeding: Secondary | ICD-10-CM

## 2022-11-12 NOTE — Progress Notes (Unsigned)
Jonathon Bellows MD, MRCP(U.K) [address]  [place]  [place], [place] [address]  Main: [phone]  Fax: [phone]   Gastroenterology Consultation  Referring Provider:     Cyndi Bender, PA-C Primary Care Physician:  Cyndi Bender, PA-C Primary Gastroenterologist:  Dr. Jonathon Bellows  Reason for Consultation:     GERD        HPI:   Jeffrey Randolph is a 67 y.o. y/o male referred for consultation & management  by Cyndi Bender, PA-C.    02/26/2022 ultrasound of the abdomen shows significant ascites 03/20/2022 CT scan of the abdomen shows hepatic cirrhosis moderate ascites  03/20/2022 acetic fluid analysisWas an albumin of less than 1.5 total protein less than 3 no evidence of SBP Gram stain negative acute hepatitis panel negative, HIV negative and INR 1.4   He was admitted back in June 2023 with abdominal distention noted to have ascites commenced on Lasix 40 mg Aldactone 100 mg and asked to follow-up with GI.  He says he used to drink a lot of alcohol in the past which she quit after his recent hospitalization the abdomen has not been distended since then he is on Aldactone 100 mg a day and Lasix 40 mg a day.  Denies any excess use of salt having regular bowel movements.  Denies any issues with sleep.  No prior military service tattoos illegal drug use he does admit to incarceration. Past Medical History:  Diagnosis Date   Atrial fibrillation with rapid ventricular response (HCC)    BPH (benign prostatic hyperplasia)    CVA (cerebral infarction)    GERD (gastroesophageal reflux disease)    History of stroke 05/26/2019   HNP (herniated nucleus pulposus), lumbar 01/09/2015   Hypertension    Hypokalemia    Hypomagnesemia 05/26/2019   Stroke ([place])    Left foot always feel 2 x larger, walks with a limp.    Past Surgical History:  Procedure Laterality Date   LUMBAR LAMINECTOMY/DECOMPRESSION MICRODISCECTOMY Right 01/09/2015   Procedure: LUMBAR LAMINECTOMY/DECOMPRESSION  MICRODISCECTOMY 1 LEVEL;  Surgeon: Kary Kos, MD;  Location: [facility];  Service: Neurosurgery;  Laterality: Right;  LUMBAR LAMINECTOMY/DECOMPRESSION MICRODISCECTOMY 1 LEVEL RIGHT LUMBAR 2-3   TONSILLECTOMY      Prior to Admission medications   Medication Sig Start Date End Date Taking? Authorizing Provider  apixaban (ELIQUIS) 5 MG TABS tablet Take 1 tablet (5 mg total) by mouth 2 (two) times daily for 30 days. 12/22/18 03/20/22  Maudie Flakes, MD  diltiazem (CARDIZEM CD) 240 MG 24 hr capsule Take 1 capsule (240 mg total) by mouth daily. 12/01/20   Fenton, Clint R, PA  furosemide (LASIX) 40 MG tablet Take 1 tablet (40 mg total) by mouth daily. 03/21/22   Patrecia Pour, MD  omeprazole (PRILOSEC) 40 MG capsule Take 40 mg by mouth daily.    [provider]  pregabalin (LYRICA) 150 MG capsule Take 150 mg by mouth 3 (three) times daily. 06/13/22   [provider]  spironolactone (ALDACTONE) 50 MG tablet Take 50 mg by mouth 2 (two) times daily. 03/06/22   [provider]    Family History  Problem Relation Age of Onset   Pancreatic cancer Mother      Social History   Tobacco Use   Smoking status: Former    Years: 3.00    Types: Cigarettes   Smokeless tobacco: Never   Tobacco comments:    quit smoking in 1980's  Substance Use Topics   Alcohol  use: Not Currently   Drug use: No    Allergies as of 11/12/2022   (No Known Allergies)    Review of Systems:    All systems reviewed and negative except where noted in HPI.   Physical Exam:  BP (!) 154/74   Pulse 93   Temp (!) 97.4 F (36.3 C) (Oral)   Wt 175 lb 6.4 oz (79.6 kg)   BMI 25.90 kg/m  No LMP for male patient. Psych:  Alert and cooperative. Normal mood and affect. General:   Alert,  Well-developed, well-nourished, pleasant and cooperative in NAD Head:  Normocephalic and atraumatic. Eyes:  Sclera clear, no icterus.   Conjunctiva pink. Ears:  Normal auditory acuity. Neck:  Supple; no masses or  thyromegaly. Lungs:  Respirations even and unlabored.  Clear throughout to auscultation.   No wheezes, crackles, or rhonchi. No acute distress. Heart:  Regular rate and rhythm; no murmurs, clicks, rubs, or gallops. Abdomen:  Normal bowel sounds.  No bruits.  Soft, non-tender and non-distended without masses, hepatosplenomegaly or hernias noted.  No guarding or rebound tenderness.    Neurologic:  Alert and oriented x3;  grossly normal neurologically. Psych:  Alert and cooperative. Normal mood and affect.  Imaging Studies: No results found.  Assessment and Plan:   Jeffrey Randolph is a 67 y.o. y/o male has been referred for chronic liver disease.  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  He has not had liver workup he has quit drinking alcohol and clinically does not have much ascites well-controlled on Lasix 40 mg and Aldactone 100 mg   Plan  RUQ USG to screen for [facility] EGD to screen for varices Full autoimmune and viral hepatitis work up  Vaccination to be decided next visit Low salt diet suggested and avoid NSAID's   I have discussed alternative options, risks & benefits,  which include, but are not limited to, bleeding, infection, perforation,respiratory complication & drug reaction.  The patient agrees with this plan & written consent will be obtained.     Follow up in 4 months  Dr Jonathon Bellows MD,MRCP(U.K)

## 2022-11-13 ENCOUNTER — Telehealth: Payer: Self-pay

## 2022-11-13 NOTE — Telephone Encounter (Signed)
I faxed a blood thinner hold request for patient to Cyndi Bender, [place]. Awaiting for his response. Procedure date: 01/06/2023  EGD

## 2022-11-14 ENCOUNTER — Encounter (HOSPITAL_COMMUNITY): Payer: Self-pay | Admitting: *Deleted

## 2022-11-14 NOTE — Telephone Encounter (Signed)
Dr. Tobie Lords responded back to our Eliquis hold form and stated that the patient is to hold his Elequis 2 days before his procedure and restart it 1 day after his procedure. Patient was notified and I will mail a copy as well. The original copy will be in his chart under Media.

## 2022-11-19 ENCOUNTER — Ambulatory Visit: Payer: 59 | Attending: Gastroenterology

## 2022-11-19 LAB — ANA: Anti Nuclear Antibody (ANA): NEGATIVE

## 2022-11-19 LAB — COMPREHENSIVE METABOLIC PANEL
ALT: 16 IU/L (ref 0–44)
AST: 28 IU/L (ref 0–40)
Albumin/Globulin Ratio: 1.3 (ref 1.2–2.2)
Albumin: 4.1 g/dL (ref 3.9–4.9)
Alkaline Phosphatase: 138 IU/L — ABNORMAL HIGH (ref 44–121)
BUN/Creatinine Ratio: 5 — ABNORMAL LOW (ref 10–24)
BUN: 3 mg/dL — ABNORMAL LOW (ref 8–27)
Bilirubin Total: 1.4 mg/dL — ABNORMAL HIGH (ref 0.0–1.2)
CO2: 20 mmol/L (ref 20–29)
Calcium: 9.1 mg/dL (ref 8.6–10.2)
Chloride: 96 mmol/L (ref 96–106)
Creatinine, Ser: 0.58 mg/dL — ABNORMAL LOW (ref 0.76–1.27)
Globulin, Total: 3.2 g/dL (ref 1.5–4.5)
Glucose: 112 mg/dL — ABNORMAL HIGH (ref 70–99)
Potassium: 4.2 mmol/L (ref 3.5–5.2)
Sodium: 133 mmol/L — ABNORMAL LOW (ref 134–144)
Total Protein: 7.3 g/dL (ref 6.0–8.5)
eGFR: 108 mL/min/{1.73_m2} (ref >59)

## 2022-11-19 LAB — IRON,TIBC AND FERRITIN PANEL
Ferritin: 258 ng/mL (ref 30–400)
Iron Saturation: 46 % (ref 15–55)
Iron: 159 ug/dL (ref 38–169)
Total Iron Binding Capacity: 347 ug/dL (ref 250–450)
UIBC: 188 ug/dL (ref 111–343)

## 2022-11-19 LAB — HIV ANTIBODY (ROUTINE TESTING W REFLEX): HIV Screen 4th Generation wRfx: NONREACTIVE

## 2022-11-19 LAB — IMMUNOGLOBULINS A/E/G/M, SERUM
IgA/Immunoglobulin A, Serum: 429 mg/dL (ref 61–437)
IgE (Immunoglobulin E), Serum: 55 IU/mL (ref 6–495)
IgG (Immunoglobin G), Serum: 1509 mg/dL (ref 603–1613)
IgM (Immunoglobulin M), Srm: 142 mg/dL (ref 20–172)

## 2022-11-19 LAB — MITOCHONDRIAL/SMOOTH MUSCLE AB PNL
Mitochondrial Ab: <20 Units (ref 0.0–20.0)
Smooth Muscle Ab: 23 Units — ABNORMAL HIGH (ref 0–19)

## 2022-11-19 LAB — HEPATITIS C ANTIBODY: Hep C Virus Ab: NONREACTIVE

## 2022-11-19 LAB — CERULOPLASMIN: Ceruloplasmin: 29.1 mg/dL (ref 16.0–31.0)

## 2022-11-19 LAB — PROTIME-INR
INR: 1.3 — ABNORMAL HIGH (ref 0.9–1.2)
Prothrombin Time: 14 s — ABNORMAL HIGH (ref 9.1–12.0)

## 2022-11-19 LAB — HEPATITIS B E ANTIBODY: Hep B E Ab: NEGATIVE

## 2022-11-19 LAB — HEPATITIS B E ANTIGEN: Hep B E Ag: NEGATIVE

## 2022-11-19 LAB — ANTI-MICROSOMAL ANTIBODY LIVER / KIDNEY: LKM1 Ab: 5.7 Units (ref 0.0–20.0)

## 2022-11-19 LAB — HEPATITIS B SURFACE ANTIGEN: Hepatitis B Surface Ag: NEGATIVE

## 2022-11-19 LAB — CELIAC DISEASE AB SCREEN W/RFX
Antigliadin Abs, IgA: 43 units — ABNORMAL HIGH (ref 0–19)
Transglutaminase IgA: 10 U/mL — ABNORMAL HIGH (ref 0–3)

## 2022-11-19 LAB — HEPATITIS A ANTIBODY, TOTAL: hep A Total Ab: POSITIVE — AB

## 2022-11-19 LAB — AFP TUMOR MARKER: AFP, Serum, Tumor Marker: 2.8 ng/mL (ref 0.0–8.4)

## 2022-11-19 LAB — ENDOMYSIAL IGA ANTIBODY: Endomysial IgA: NEGATIVE

## 2022-11-19 LAB — ALPHA-1-ANTITRYPSIN: A-1 Antitrypsin: 156 mg/dL (ref 101–187)

## 2022-11-19 LAB — HEPATITIS B SURFACE ANTIBODY,QUALITATIVE: Hep B Surface Ab, Qual: NONREACTIVE

## 2022-11-19 LAB — HEPATITIS B CORE ANTIBODY, TOTAL: Hep B Core Total Ab: NEGATIVE

## 2022-11-19 LAB — CK: Total CK: 49 U/L (ref 41–331)

## 2022-12-30 ENCOUNTER — Encounter: Payer: Self-pay | Admitting: Gastroenterology

## 2023-01-06 ENCOUNTER — Encounter: Payer: Self-pay | Admitting: Gastroenterology

## 2023-01-06 ENCOUNTER — Ambulatory Visit: Payer: 59 | Admitting: Registered Nurse

## 2023-01-06 ENCOUNTER — Encounter
Admission: RE | Disposition: A | Discharge status: Home / Self Care | Payer: Self-pay | Source: Home / Self Care | Attending: Gastroenterology

## 2023-01-06 ENCOUNTER — Ambulatory Visit
Admission: RE | Admit: 2023-01-06 | Discharge: 2023-01-06 | Disposition: A | Discharge status: Home / Self Care | Payer: 59 | Attending: Gastroenterology | Admitting: Gastroenterology

## 2023-01-06 DIAGNOSIS — Z79899 Other long term (current) drug therapy: Secondary | ICD-10-CM | POA: Insufficient documentation

## 2023-01-06 DIAGNOSIS — I10 Essential (primary) hypertension: Secondary | ICD-10-CM | POA: Diagnosis not present

## 2023-01-06 DIAGNOSIS — K7460 Unspecified cirrhosis of liver: Secondary | ICD-10-CM | POA: Diagnosis not present

## 2023-01-06 DIAGNOSIS — Z87891 Personal history of nicotine dependence: Secondary | ICD-10-CM | POA: Insufficient documentation

## 2023-01-06 DIAGNOSIS — K219 Gastro-esophageal reflux disease without esophagitis: Secondary | ICD-10-CM | POA: Insufficient documentation

## 2023-01-06 DIAGNOSIS — I4891 Unspecified atrial fibrillation: Secondary | ICD-10-CM | POA: Diagnosis not present

## 2023-01-06 DIAGNOSIS — I8500 Esophageal varices without bleeding: Secondary | ICD-10-CM | POA: Diagnosis not present

## 2023-01-06 DIAGNOSIS — Z8673 Personal history of transient ischemic attack (TIA), and cerebral infarction without residual deficits: Secondary | ICD-10-CM | POA: Insufficient documentation

## 2023-01-06 HISTORY — PX: ESOPHAGOGASTRODUODENOSCOPY (EGD) WITH PROPOFOL: SHX5813

## 2023-01-06 HISTORY — DX: Cardiac arrhythmia, unspecified: I49.9

## 2023-01-06 SURGERY — ESOPHAGOGASTRODUODENOSCOPY (EGD) WITH PROPOFOL
Anesthesia: General

## 2023-01-06 MED ORDER — SODIUM CHLORIDE 0.9 % IV SOLN: INTRAVENOUS | Status: DC

## 2023-01-06 MED ORDER — LIDOCAINE HCL (CARDIAC) PF 100 MG/5ML IV SOSY
PREFILLED_SYRINGE | INTRAVENOUS | Status: DC | PRN
  Administered 2023-01-06: 50 mg via INTRAVENOUS

## 2023-01-06 MED ORDER — PROPOFOL 10 MG/ML IV BOLUS
INTRAVENOUS | Status: DC | PRN
  Administered 2023-01-06: 30 mg via INTRAVENOUS
  Administered 2023-01-06: 70 mg via INTRAVENOUS

## 2023-01-06 MED ORDER — PROPOFOL 500 MG/50ML IV EMUL
INTRAVENOUS | Status: DC | PRN
  Administered 2023-01-06: 150 ug/kg/min via INTRAVENOUS

## 2023-01-06 NOTE — Anesthesia Preprocedure Evaluation (Signed)
Anesthesia Evaluation  Patient identified by MRN, date of birth, ID band Patient awake    Reviewed: Allergy & Precautions, H&P , NPO status , Patient's Chart, lab work & pertinent test results, reviewed documented beta blocker date and time   Airway Mallampati: II   Neck ROM: full    Dental  (+) Poor Dentition   Pulmonary neg pulmonary ROS, former smoker   Pulmonary exam normal        Cardiovascular Exercise Tolerance: Poor hypertension, On Medications Normal cardiovascular exam+ dysrhythmias  Rhythm:regular Rate:Normal     Neuro/Psych CVA, Residual Symptoms  negative psych ROS   GI/Hepatic Neg liver ROS,GERD  Medicated,,  Endo/Other  negative endocrine ROS    Renal/GU negative Renal ROS  negative genitourinary   Musculoskeletal   Abdominal   Peds  Hematology negative hematology ROS (+)   Anesthesia Other Findings Past Medical History: No date: Atrial fibrillation with rapid ventricular response No date: BPH (benign prostatic hyperplasia) No date: CVA (cerebral infarction) No date: Dysrhythmia No date: GERD (gastroesophageal reflux disease) 05/26/2019: History of stroke 01/09/2015: HNP (herniated nucleus pulposus), lumbar No date: Hypertension No date: Hypokalemia 05/26/2019: Hypomagnesemia No date: Stroke     Comment:  Left foot always feel 2 x larger, walks with a limp. Past Surgical History: 01/09/2015: LUMBAR LAMINECTOMY/DECOMPRESSION MICRODISCECTOMY; Right     Comment:  Procedure: LUMBAR LAMINECTOMY/DECOMPRESSION               MICRODISCECTOMY 1 LEVEL;  Surgeon: Kary Kos, MD;                Location: [facility];  Service: Neurosurgery;                Laterality: Right;  LUMBAR LAMINECTOMY/DECOMPRESSION               MICRODISCECTOMY 1 LEVEL RIGHT LUMBAR 2-3 No date: TONSILLECTOMY BMI    Body Mass Index: 25.84 kg/m     Reproductive/Obstetrics negative OB ROS                              Anesthesia Physical Anesthesia Plan  ASA: 3  Anesthesia Plan: General   Post-op Pain Management:    Induction:   PONV Risk Score and Plan:   Airway Management Planned:   Additional Equipment:   Intra-op Plan:   Post-operative Plan:   Informed Consent: I have reviewed the patients History and Physical, chart, labs and discussed the procedure including the risks, benefits and alternatives for the proposed anesthesia with the patient or authorized representative who has indicated his/her understanding and acceptance.     Dental Advisory Given  Plan Discussed with: CRNA  Anesthesia Plan Comments:        Anesthesia Quick Evaluation

## 2023-01-06 NOTE — Op Note (Signed)
[facility] Patient Name: Jeffrey Randolph Procedure Date: 01/06/2023 9:22 AM MRN: [id number] Account #: [id number] Date of Birth: [birth year] Admit Type: Outpatient Age: 67 Room: [facility] ENDO ROOM 1 Gender: Male Note Status: Finalized Instrument Name: Upper Endoscope [id number] Procedure:             Upper GI endoscopy Indications:           Cirrhosis rule out esophageal varices Providers:             Jonathon Bellows MD, MD Medicines:             Monitored Anesthesia Care Complications:         No immediate complications. Procedure:             Pre-Anesthesia Assessment:                        - Prior to the procedure, a History and Physical was                         performed, and patient medications, allergies and                         sensitivities were reviewed. The patient's tolerance                         of previous anesthesia was reviewed.                        - The risks and benefits of the procedure and the                         sedation options and risks were discussed with the                         patient. All questions were answered and informed                         consent was obtained.                        - ASA Grade Assessment: II - A patient with mild                         systemic disease.                        After obtaining informed consent, the endoscope was                         passed under direct vision. Throughout the procedure,                         the patient's blood pressure, pulse, and oxygen                         saturations were monitored continuously. The Endoscope                         was introduced through the mouth, and advanced to the  third part of duodenum. The upper GI endoscopy was                         accomplished without difficulty. The patient tolerated                         the procedure well. Findings:      The esophagus was normal.      The stomach was  normal.      The examined duodenum was normal. Impression:            - Normal esophagus.                        - Normal stomach.                        - Normal examined duodenum.                        - No specimens collected. Recommendation:        - Discharge patient to home (with escort).                        - Resume previous diet.                        - Continue present medications.                        - Repeat upper endoscopy in 3 years for surveillance. Procedure Code(s):     --- Professional ---                        [id number], Esophagogastroduodenoscopy, flexible,                         transoral; diagnostic, including collection of                         specimen(s) by brushing or washing, when performed                         (separate procedure) Diagnosis Code(s):     --- Professional ---                        K74.60, Unspecified cirrhosis of liver CPT copyright 2022 American Medical Association. All rights reserved. The codes documented in this report are preliminary and upon coder review may  be revised to meet current compliance requirements. Jonathon Bellows, MD Jonathon Bellows MD, MD 01/06/2023 9:36:55 AM This report has been signed electronically. Number of Addenda: 0 Note Initiated On: 01/06/2023 9:22 AM Estimated Blood Loss:  Estimated blood loss: none.      [facility]

## 2023-01-06 NOTE — Transfer of Care (Signed)
Immediate Anesthesia Transfer of Care Note  Patient: Jeffrey Randolph  Procedure(s) Performed: ESOPHAGOGASTRODUODENOSCOPY (EGD) WITH PROPOFOL  Patient Location: Endoscopy Unit  Anesthesia Type:General  Level of Consciousness: drowsy  Airway & Oxygen Therapy: Patient Spontanous Breathing  Post-op Assessment: Report given to RN and Post -op Vital signs reviewed and stable  Post vital signs: Reviewed and stable  Last Vitals:  Vitals Value Taken Time  BP 106/62 01/06/23 0940  Temp 36.2 C 01/06/23 0940  Pulse 71 01/06/23 0940  Resp 11 01/06/23 0940  SpO2 94 % 01/06/23 0940    Last Pain:  Vitals:   01/06/23 0940  TempSrc: Temporal  PainSc: Asleep         Complications: No notable events documented.

## 2023-01-06 NOTE — H&P (Signed)
Jonathon Bellows, MD [address], [place], [place], [place], [address] [address], [place], [place], [place], [address] Phone: [phone]  Fax: [phone]  Primary Care Physician:  Cyndi Bender, PA-C   Pre-Procedure History & Physical: HPI:  Jeffrey Randolph is a 67 y.o. male is here for an endoscopy    Past Medical History:  Diagnosis Date   Atrial fibrillation with rapid ventricular response    BPH (benign prostatic hyperplasia)    CVA (cerebral infarction)    Dysrhythmia    GERD (gastroesophageal reflux disease)    History of stroke 05/26/2019   HNP (herniated nucleus pulposus), lumbar 01/09/2015   Hypertension    Hypokalemia    Hypomagnesemia 05/26/2019   Stroke    Left foot always feel 2 x larger, walks with a limp.    Past Surgical History:  Procedure Laterality Date   LUMBAR LAMINECTOMY/DECOMPRESSION MICRODISCECTOMY Right 01/09/2015   Procedure: LUMBAR LAMINECTOMY/DECOMPRESSION MICRODISCECTOMY 1 LEVEL;  Surgeon: Kary Kos, MD;  Location: [facility];  Service: Neurosurgery;  Laterality: Right;  LUMBAR LAMINECTOMY/DECOMPRESSION MICRODISCECTOMY 1 LEVEL RIGHT LUMBAR 2-3   TONSILLECTOMY      Prior to Admission medications   Medication Sig Start Date End Date Taking? Authorizing Provider  diltiazem (CARDIZEM CD) 240 MG 24 hr capsule Take 1 capsule (240 mg total) by mouth daily. 12/01/20  Yes Fenton, Clint R, PA  furosemide (LASIX) 40 MG tablet Take 1 tablet (40 mg total) by mouth daily. 03/21/22  Yes Patrecia Pour, MD  omeprazole (PRILOSEC) 40 MG capsule Take 40 mg by mouth daily.   Yes [provider]  pregabalin (LYRICA) 150 MG capsule Take 150 mg by mouth 3 (three) times daily. 06/13/22  Yes [provider]  spironolactone (ALDACTONE) 50 MG tablet Take 50 mg by mouth 2 (two) times daily. 03/06/22  Yes [provider]  apixaban (ELIQUIS) 5 MG TABS tablet Take 1 tablet (5 mg total) by mouth 2 (two) times daily for 30 days. 12/22/18 11/12/22   Maudie Flakes, MD    Allergies as of 11/12/2022   (No Known Allergies)    Family History  Problem Relation Age of Onset   Pancreatic cancer Mother     Social History   Socioeconomic History   Marital status: Single    Spouse name: Not on file   Number of children: Not on file   Years of education: Not on file   Highest education level: Not on file  Occupational History   Not on file  Tobacco Use   Smoking status: Former    Years: 3    Types: Cigarettes   Smokeless tobacco: Never   Tobacco comments:    quit smoking in 1980's  Vaping Use   Vaping Use: Never used  Substance and Sexual Activity   Alcohol use: Not Currently    Comment: stopped drinking about a year ago   Drug use: No   Sexual activity: Not on file  Other Topics Concern   Not on file  Social History Narrative   Not on file   Social Determinants of Health   Financial Resource Strain: Not on file  Food Insecurity: Not on file  Transportation Needs: Not on file  Physical Activity: Not on file  Stress: Not on file  Social Connections: Not on file  Intimate Partner Violence: Not on file    Review of Systems: See HPI, otherwise negative ROS  Physical Exam: There were no vitals taken for this visit.  General:   Alert,  pleasant and cooperative in NAD Head:  Normocephalic and atraumatic. Neck:  Supple; no masses or thyromegaly. Lungs:  Clear throughout to auscultation, normal respiratory effort.    Heart:  +S1, +S2, Regular rate and rhythm, No edema. Abdomen:  Soft, nontender and nondistended. Normal bowel sounds, without guarding, and without rebound.   Neurologic:  Alert and  oriented x4;  grossly normal neurologically.  Impression/Plan: Jeffrey Randolph is here for an endoscopy  to be performed for  evaluation of cirrhosis of liver    Risks, benefits, limitations, and alternatives regarding endoscopy have been reviewed with the patient.  Questions have been answered.  All parties  agreeable.   Jonathon Bellows, MD  01/06/2023, 8:57 AM

## 2023-01-06 NOTE — Anesthesia Postprocedure Evaluation (Signed)
Anesthesia Post Note  Patient: Jeffrey Randolph  Procedure(s) Performed: ESOPHAGOGASTRODUODENOSCOPY (EGD) WITH PROPOFOL  Patient location during evaluation: PACU Anesthesia Type: General Level of consciousness: awake and alert Pain management: pain level controlled Vital Signs Assessment: post-procedure vital signs reviewed and stable Respiratory status: spontaneous breathing, nonlabored ventilation, respiratory function stable and patient connected to nasal cannula oxygen Cardiovascular status: blood pressure returned to baseline and stable Postop Assessment: no apparent nausea or vomiting Anesthetic complications: no   No notable events documented.   Last Vitals:  Vitals:   01/06/23 0950 01/06/23 1000  BP: 126/76 127/86  Pulse: 72 71  Resp: 13 13  Temp:    SpO2: 98% 99%    Last Pain:  Vitals:   01/06/23 1000  TempSrc:   PainSc: 0-No pain                 Molli Barrows

## 2023-01-07 ENCOUNTER — Encounter: Payer: Self-pay | Admitting: Gastroenterology

## 2023-01-15 NOTE — Progress Notes (Signed)
Recheck celiac serology before next visit; weakly positive

## 2023-01-17 ENCOUNTER — Other Ambulatory Visit: Payer: Self-pay

## 2023-01-17 DIAGNOSIS — R188 Other ascites: Secondary | ICD-10-CM

## 2023-01-20 DIAGNOSIS — I4891 Unspecified atrial fibrillation: Secondary | ICD-10-CM | POA: Diagnosis not present

## 2023-01-20 DIAGNOSIS — Z139 Encounter for screening, unspecified: Secondary | ICD-10-CM | POA: Diagnosis not present

## 2023-01-20 DIAGNOSIS — Z9181 History of falling: Secondary | ICD-10-CM | POA: Diagnosis not present

## 2023-01-20 DIAGNOSIS — I10 Essential (primary) hypertension: Secondary | ICD-10-CM | POA: Diagnosis not present

## 2023-01-20 DIAGNOSIS — I69359 Hemiplegia and hemiparesis following cerebral infarction affecting unspecified side: Secondary | ICD-10-CM | POA: Diagnosis not present

## 2023-01-20 DIAGNOSIS — R188 Other ascites: Secondary | ICD-10-CM | POA: Diagnosis not present

## 2023-01-20 DIAGNOSIS — G5790 Unspecified mononeuropathy of unspecified lower limb: Secondary | ICD-10-CM | POA: Diagnosis not present

## 2023-01-20 DIAGNOSIS — K7460 Unspecified cirrhosis of liver: Secondary | ICD-10-CM | POA: Diagnosis not present

## 2023-03-17 ENCOUNTER — Ambulatory Visit: Payer: 59 | Admitting: Gastroenterology

## 2023-03-17 ENCOUNTER — Other Ambulatory Visit: Payer: Self-pay

## 2023-03-17 NOTE — Progress Notes (Deleted)
Wyline Mood MD, MRCP(U.K) [address]  [address]  [place], [place] [address]  Main: [phone]  Fax: [phone]   Primary Care Physician: Lonie Peak, PA-C  Primary Gastroenterologist:  Dr. Wyline Mood   No chief complaint on file.   HPI: Jeffrey Randolph is a 67 y.o. male   Summary of history :  Noticed with ascites, alcoholic liver cirrhosis in June 2023 when he presented with abdominal distention.  He has quit alcohol drinking since hospitalization.  No other risk factors.   02/26/2022 ultrasound of the abdomen shows significant ascites 03/20/2022 CT scan of the abdomen shows hepatic cirrhosis moderate ascites   03/20/2022 acetic fluid analysisWas an albumin of less than 1.5 total protein less than 3 no evidence of SBP Gram stain negative acute hepatitis panel negative, HIV negative and INR 1.4    Interval history 11/12/2022-03/17/2023  03/27/2023: Antigliadin antibody 43 mildly elevated tissue transglutaminase antibody borderline at 10.  Hepatitis A total antibody positive not immune to hepatitis B negative hepatitis B surface antigen and antibody for hepatitis C.  HIV negative.  Smooth muscle antibody mildly positive at 23.  Rest of autoimmune screen negative AFP 2.8.  Alkaline phosphatase 138.   01/06/2023 EGD: Normal no  esophageal varices  11/12/2022 MELD 13    ***  Current Outpatient Medications  Medication Sig Dispense Refill   apixaban (ELIQUIS) 5 MG TABS tablet Take 1 tablet (5 mg total) by mouth 2 (two) times daily for 30 days. 60 tablet 0   diltiazem (CARDIZEM CD) 240 MG 24 hr capsule Take 1 capsule (240 mg total) by mouth daily. 30 capsule 11   furosemide (LASIX) 40 MG tablet Take 1 tablet (40 mg total) by mouth daily. 30 tablet 0   omeprazole (PRILOSEC) 40 MG capsule Take 40 mg by mouth daily.     pregabalin (LYRICA) 200 MG capsule Take 200 mg by mouth 3 (three) times daily.     spironolactone (ALDACTONE) 50 MG tablet Take 50 mg by mouth 2 (two) times  daily.     No current facility-administered medications for this visit.    Allergies as of 03/17/2023   (No Known Allergies)       Interval history   ***/***/202*   ***/***/2024   ROS:  General: Negative for anorexia, weight loss, fever, chills, fatigue, weakness. ENT: Negative for hoarseness, difficulty swallowing , nasal congestion. CV: Negative for chest pain, angina, palpitations, dyspnea on exertion, peripheral edema.  Respiratory: Negative for dyspnea at rest, dyspnea on exertion, cough, sputum, wheezing.  GI: See history of present illness. GU:  Negative for dysuria, hematuria, urinary incontinence, urinary frequency, nocturnal urination.  Endo: Negative for unusual weight change.    Physical Examination:   There were no vitals taken for this visit.  General: Well-nourished, well-developed in no acute distress.  Eyes: No icterus. Conjunctivae pink. Mouth: Oropharyngeal mucosa moist and pink , no lesions erythema or exudate. Lungs: Clear to auscultation bilaterally. Non-labored. Heart: Regular rate and rhythm, no murmurs rubs or gallops.  Abdomen: Bowel sounds are normal, nontender, nondistended, no hepatosplenomegaly or masses, no abdominal bruits or hernia , no rebound or guarding.   Extremities: No lower extremity edema. No clubbing or deformities. Neuro: Alert and oriented x 3.  Grossly intact. Skin: Warm and dry, no jaundice.   Psych: Alert and cooperative, normal mood and affect.   Imaging Studies: No results found.  Assessment and Plan:   Jeffrey Randolph is a 67 y.o. y/o male to follow-up for  chronic liver disease.  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  He has not had liver workup he has quit drinking alcohol and clinically does not have much ascites well-controlled on Lasix 40 mg and Aldactone 100 mg     Plan  RUQ USG to screen for [facility] EGD to screen for varices Smooth muscle antibody positive tissue transglutaminase  antibody borderline positive we will recheck Hepatitis B vaccine immune to hepatitis A Low salt diet suggested and avoid NSAID's     Dr Wyline Mood  MD,MRCP [facility]) Follow up in ***  BP check ***

## 2023-04-29 DIAGNOSIS — G5790 Unspecified mononeuropathy of unspecified lower limb: Secondary | ICD-10-CM | POA: Diagnosis not present

## 2023-04-29 DIAGNOSIS — G2581 Restless legs syndrome: Secondary | ICD-10-CM | POA: Diagnosis not present

## 2023-05-08 ENCOUNTER — Inpatient Hospital Stay
Admission: EM | Admit: 2023-05-08 | Discharge: 2023-05-12 | DRG: 641 | Disposition: A | Discharge status: Home Health | Payer: 59 | Attending: Hospitalist | Admitting: Hospitalist

## 2023-05-08 ENCOUNTER — Inpatient Hospital Stay: Payer: 59

## 2023-05-08 ENCOUNTER — Other Ambulatory Visit: Payer: Self-pay

## 2023-05-08 DIAGNOSIS — I5032 Chronic diastolic (congestive) heart failure: Secondary | ICD-10-CM | POA: Diagnosis not present

## 2023-05-08 DIAGNOSIS — Z85828 Personal history of other malignant neoplasm of skin: Secondary | ICD-10-CM | POA: Diagnosis not present

## 2023-05-08 DIAGNOSIS — E876 Hypokalemia: Secondary | ICD-10-CM | POA: Diagnosis not present

## 2023-05-08 DIAGNOSIS — R6889 Other general symptoms and signs: Secondary | ICD-10-CM | POA: Diagnosis not present

## 2023-05-08 DIAGNOSIS — F1011 Alcohol abuse, in remission: Secondary | ICD-10-CM | POA: Insufficient documentation

## 2023-05-08 DIAGNOSIS — I6782 Cerebral ischemia: Secondary | ICD-10-CM | POA: Diagnosis not present

## 2023-05-08 DIAGNOSIS — K219 Gastro-esophageal reflux disease without esophagitis: Secondary | ICD-10-CM | POA: Diagnosis not present

## 2023-05-08 DIAGNOSIS — Z7901 Long term (current) use of anticoagulants: Secondary | ICD-10-CM | POA: Diagnosis not present

## 2023-05-08 DIAGNOSIS — E871 Hypo-osmolality and hyponatremia: Principal | ICD-10-CM | POA: Diagnosis present

## 2023-05-08 DIAGNOSIS — D696 Thrombocytopenia, unspecified: Secondary | ICD-10-CM | POA: Diagnosis present

## 2023-05-08 DIAGNOSIS — N400 Enlarged prostate without lower urinary tract symptoms: Secondary | ICD-10-CM | POA: Diagnosis present

## 2023-05-08 DIAGNOSIS — E860 Dehydration: Secondary | ICD-10-CM | POA: Diagnosis present

## 2023-05-08 DIAGNOSIS — R531 Weakness: Secondary | ICD-10-CM | POA: Diagnosis not present

## 2023-05-08 DIAGNOSIS — E872 Acidosis, unspecified: Secondary | ICD-10-CM | POA: Diagnosis present

## 2023-05-08 DIAGNOSIS — Z87891 Personal history of nicotine dependence: Secondary | ICD-10-CM

## 2023-05-08 DIAGNOSIS — Z79899 Other long term (current) drug therapy: Secondary | ICD-10-CM | POA: Diagnosis not present

## 2023-05-08 DIAGNOSIS — I2510 Atherosclerotic heart disease of native coronary artery without angina pectoris: Secondary | ICD-10-CM | POA: Diagnosis not present

## 2023-05-08 DIAGNOSIS — R188 Other ascites: Secondary | ICD-10-CM | POA: Diagnosis present

## 2023-05-08 DIAGNOSIS — A0811 Acute gastroenteropathy due to Norwalk agent: Secondary | ICD-10-CM | POA: Diagnosis present

## 2023-05-08 DIAGNOSIS — I110 Hypertensive heart disease with heart failure: Secondary | ICD-10-CM | POA: Diagnosis present

## 2023-05-08 DIAGNOSIS — I10 Essential (primary) hypertension: Secondary | ICD-10-CM | POA: Diagnosis not present

## 2023-05-08 DIAGNOSIS — I482 Chronic atrial fibrillation, unspecified: Secondary | ICD-10-CM | POA: Diagnosis present

## 2023-05-08 DIAGNOSIS — G2581 Restless legs syndrome: Secondary | ICD-10-CM | POA: Diagnosis present

## 2023-05-08 DIAGNOSIS — R197 Diarrhea, unspecified: Secondary | ICD-10-CM | POA: Diagnosis not present

## 2023-05-08 DIAGNOSIS — Z951 Presence of aortocoronary bypass graft: Secondary | ICD-10-CM | POA: Diagnosis not present

## 2023-05-08 DIAGNOSIS — K7460 Unspecified cirrhosis of liver: Secondary | ICD-10-CM | POA: Diagnosis present

## 2023-05-08 DIAGNOSIS — I480 Paroxysmal atrial fibrillation: Secondary | ICD-10-CM | POA: Diagnosis present

## 2023-05-08 DIAGNOSIS — S0990XA Unspecified injury of head, initial encounter: Secondary | ICD-10-CM | POA: Diagnosis not present

## 2023-05-08 DIAGNOSIS — Z800 Family history of malignant neoplasm of digestive organs: Secondary | ICD-10-CM

## 2023-05-08 DIAGNOSIS — E440 Moderate protein-calorie malnutrition: Secondary | ICD-10-CM | POA: Diagnosis not present

## 2023-05-08 DIAGNOSIS — G8929 Other chronic pain: Secondary | ICD-10-CM | POA: Diagnosis present

## 2023-05-08 DIAGNOSIS — K8020 Calculus of gallbladder without cholecystitis without obstruction: Secondary | ICD-10-CM | POA: Diagnosis not present

## 2023-05-08 DIAGNOSIS — M5136 Other intervertebral disc degeneration, lumbar region: Secondary | ICD-10-CM | POA: Diagnosis present

## 2023-05-08 DIAGNOSIS — K766 Portal hypertension: Secondary | ICD-10-CM | POA: Diagnosis not present

## 2023-05-08 DIAGNOSIS — E861 Hypovolemia: Secondary | ICD-10-CM | POA: Diagnosis present

## 2023-05-08 DIAGNOSIS — Z743 Need for continuous supervision: Secondary | ICD-10-CM | POA: Diagnosis not present

## 2023-05-08 DIAGNOSIS — Z6825 Body mass index (BMI) 25.0-25.9, adult: Secondary | ICD-10-CM

## 2023-05-08 DIAGNOSIS — Z8673 Personal history of transient ischemic attack (TIA), and cerebral infarction without residual deficits: Secondary | ICD-10-CM | POA: Diagnosis not present

## 2023-05-08 DIAGNOSIS — I509 Heart failure, unspecified: Secondary | ICD-10-CM | POA: Diagnosis not present

## 2023-05-08 DIAGNOSIS — K838 Other specified diseases of biliary tract: Secondary | ICD-10-CM | POA: Diagnosis not present

## 2023-05-08 LAB — BASIC METABOLIC PANEL
Anion gap: 14 (ref 5–15)
BUN: <5 mg/dL — ABNORMAL LOW (ref 8–23)
CO2: 18 mmol/L — ABNORMAL LOW (ref 22–32)
Calcium: 8 mg/dL — ABNORMAL LOW (ref 8.9–10.3)
Chloride: 82 mmol/L — ABNORMAL LOW (ref 98–111)
Creatinine, Ser: 0.37 mg/dL — ABNORMAL LOW (ref 0.61–1.24)
GFR, Estimated: >60 mL/min (ref >60)
Glucose, Bld: 90 mg/dL (ref 70–99)
Potassium: 3.7 mmol/L (ref 3.5–5.1)
Sodium: 114 mmol/L — CL (ref 135–145)

## 2023-05-08 LAB — GASTROINTESTINAL PANEL BY PCR, STOOL (REPLACES STOOL CULTURE)

## 2023-05-08 LAB — URINALYSIS, ROUTINE W REFLEX MICROSCOPIC
Bacteria, UA: NONE SEEN
Bilirubin Urine: NEGATIVE
Glucose, UA: NEGATIVE mg/dL
Ketones, ur: 80 mg/dL — AB
Leukocytes,Ua: NEGATIVE
Nitrite: NEGATIVE
Protein, ur: 30 mg/dL — AB
Specific Gravity, Urine: 1.018 (ref 1.005–1.030)
Squamous Epithelial / HPF: NONE SEEN /HPF (ref 0–5)
pH: 6 (ref 5.0–8.0)

## 2023-05-08 LAB — HEPATIC FUNCTION PANEL
ALT: 16 U/L (ref 0–44)
AST: 28 U/L (ref 15–41)
Albumin: 3.5 g/dL (ref 3.5–5.0)
Alkaline Phosphatase: 106 U/L (ref 38–126)
Bilirubin, Direct: 0.6 mg/dL — ABNORMAL HIGH (ref 0.0–0.2)
Indirect Bilirubin: 2.3 mg/dL — ABNORMAL HIGH (ref 0.3–0.9)
Total Bilirubin: 2.9 mg/dL — ABNORMAL HIGH (ref 0.3–1.2)
Total Protein: 7 g/dL (ref 6.5–8.1)

## 2023-05-08 LAB — BLOOD GAS, VENOUS
Acid-base deficit: 4.4 mmol/L — ABNORMAL HIGH (ref 0.0–2.0)
Bicarbonate: 21 mmol/L (ref 20.0–28.0)
O2 Saturation: 52.8 %
Patient temperature: 37
pCO2, Ven: 39 mmHg — ABNORMAL LOW (ref 44–60)
pH, Ven: 7.34 (ref 7.25–7.43)
pO2, Ven: 33 mmHg (ref 32–45)

## 2023-05-08 LAB — SODIUM
Sodium: 116 mmol/L — CL (ref 135–145)
Sodium: 117 mmol/L — CL (ref 135–145)
Sodium: 120 mmol/L — ABNORMAL LOW (ref 135–145)

## 2023-05-08 LAB — CBC
HCT: 32.2 % — ABNORMAL LOW (ref 39.0–52.0)
Hemoglobin: 11.5 g/dL — ABNORMAL LOW (ref 13.0–17.0)
MCH: 35.7 pg — ABNORMAL HIGH (ref 26.0–34.0)
MCHC: 35.7 g/dL (ref 30.0–36.0)
MCV: 100 fL (ref 80.0–100.0)
Platelets: 148 10*3/uL — ABNORMAL LOW (ref 150–400)
RBC: 3.22 MIL/uL — ABNORMAL LOW (ref 4.22–5.81)
RDW: 18.7 % — ABNORMAL HIGH (ref 11.5–15.5)
WBC: 6.9 10*3/uL (ref 4.0–10.5)
nRBC: 0.3 % — ABNORMAL HIGH (ref 0.0–0.2)

## 2023-05-08 LAB — MRSA NEXT GEN BY PCR, NASAL: MRSA by PCR Next Gen: NOT DETECTED

## 2023-05-08 LAB — ETHANOL: Alcohol, Ethyl (B): <10 mg/dL (ref <10)

## 2023-05-08 LAB — GLUCOSE, CAPILLARY: Glucose-Capillary: 93 mg/dL (ref 70–99)

## 2023-05-08 MED ORDER — ROPINIROLE HCL 0.25 MG PO TABS
0.5000 mg | ORAL_TABLET | Freq: Every day | ORAL | Status: DC
  Administered 2023-05-08 – 2023-05-11 (×4): 0.5 mg via ORAL
  Filled 2023-05-08 (×5): qty 2

## 2023-05-08 MED ORDER — ONDANSETRON HCL 4 MG PO TABS: 4.0000 mg | ORAL_TABLET | Freq: Four times a day (QID) | ORAL | Status: DC | PRN

## 2023-05-08 MED ORDER — APIXABAN 5 MG PO TABS
5.0000 mg | ORAL_TABLET | Freq: Two times a day (BID) | ORAL | Status: DC
  Administered 2023-05-08 – 2023-05-12 (×8): 5 mg via ORAL
  Filled 2023-05-08 (×8): qty 1

## 2023-05-08 MED ORDER — DILTIAZEM HCL ER COATED BEADS 240 MG PO CP24
240.0000 mg | ORAL_CAPSULE | Freq: Every day | ORAL | Status: DC
  Administered 2023-05-09 – 2023-05-12 (×4): 240 mg via ORAL
  Filled 2023-05-08 (×4): qty 1

## 2023-05-08 MED ORDER — PANTOPRAZOLE SODIUM 40 MG PO TBEC
40.0000 mg | DELAYED_RELEASE_TABLET | Freq: Every day | ORAL | Status: DC
  Administered 2023-05-09 – 2023-05-12 (×4): 40 mg via ORAL
  Filled 2023-05-08 (×4): qty 1

## 2023-05-08 MED ORDER — ONDANSETRON HCL 4 MG/2ML IJ SOLN: 4.0000 mg | Freq: Four times a day (QID) | INTRAMUSCULAR | Status: DC | PRN

## 2023-05-08 MED ORDER — PREGABALIN 75 MG PO CAPS
200.0000 mg | ORAL_CAPSULE | Freq: Three times a day (TID) | ORAL | Status: DC
  Administered 2023-05-08 – 2023-05-12 (×12): 200 mg via ORAL
  Filled 2023-05-08 (×12): qty 2

## 2023-05-08 MED ORDER — SODIUM CHLORIDE 0.9 % IV BOLUS
1000.0000 mL | Freq: Once | INTRAVENOUS | Status: AC
  Administered 2023-05-08: 1000 mL via INTRAVENOUS

## 2023-05-08 MED ORDER — CHLORHEXIDINE GLUCONATE CLOTH 2 % EX PADS
6.0000 | MEDICATED_PAD | Freq: Every day | CUTANEOUS | Status: DC
  Administered 2023-05-08 – 2023-05-11 (×4): 6 via TOPICAL

## 2023-05-08 MED ORDER — DULOXETINE HCL 30 MG PO CPEP
30.0000 mg | ORAL_CAPSULE | Freq: Every day | ORAL | Status: DC
  Administered 2023-05-08 – 2023-05-09 (×2): 30 mg via ORAL
  Filled 2023-05-08 (×2): qty 1

## 2023-05-08 MED ORDER — SODIUM CHLORIDE 0.9 % IV SOLN: INTRAVENOUS | Status: DC

## 2023-05-08 MED ORDER — ENOXAPARIN SODIUM 40 MG/0.4ML IJ SOSY: 40.0000 mg | PREFILLED_SYRINGE | INTRAMUSCULAR | Status: DC

## 2023-05-08 MED ORDER — ORAL CARE MOUTH RINSE: 15.0000 mL | OROMUCOSAL | Status: DC | PRN

## 2023-05-08 NOTE — Assessment & Plan Note (Signed)
Acute on chronic weakness in the setting of decompensated hyponatremia, hepatic cirrhosis, diarrhea Nonfocal neuroexam CT head pending IV fluid hydration PT OT evaluation Follow

## 2023-05-08 NOTE — Assessment & Plan Note (Signed)
Stable at present Continue home Cardizem and Eliquis

## 2023-05-08 NOTE — Assessment & Plan Note (Addendum)
Patient reports recurrent watery bowel movements over several days No abdominal pain, nausea or vomiting White count within normal limits Will check GI stool panel x 1 Otherwise monitor

## 2023-05-08 NOTE — Assessment & Plan Note (Signed)
On Lasix and spironolactone chronically Holding for now in the setting of acute on chronic hyponatremia and mild dehydration Hepatic function panel pending

## 2023-05-08 NOTE — ED Notes (Signed)
Attempted to call report to ICU 13, secretary states RN is at lunch, will call this RN back for report.

## 2023-05-08 NOTE — H&P (Addendum)
History and Physical    Patient: Jeffrey Randolph GMW:[id number] DOB: 1956/07/16 DOA: 05/08/2023 DOS: the patient was seen and examined on 05/08/2023 PCP: Lonie Peak, PA-C  Patient coming from: Home  Chief Complaint:  Chief Complaint  Patient presents with   Weakness   HPI: [name] is a 67 y.o. male with medical history significant of atrial fibrillation, hepatic cirrhosis, hypertension, CVA, atrial fibrillation on Eliquis presenting with weakness, acute on chronic hyponatremia.  Patient reports generalized weakness over multiple weeks.  Baseline generalized weakness in setting of hepatic cirrhosis.  Reports worsening diarrhea over the past week or so.  No fevers or chills.  No abdominal pain.  No nausea or vomiting.  Diarrhea more watery in nature.  Noted baseline hepatic cirrhosis on Lasix and spironolactone.  Has still been compliant with medication regimen.  Also recently started medication for restless leg per patient.  Unclear of what medication exactly was started.  Noted weakness and falls.  No reported recent head trauma or loss of consciousness. Presented to the ER afebrile, hemodynamically stable.  White count 6.9, hemoglobin 11.5, platelets 148.  Sodium 114, creatinine 0.37.  Bicarb 18. Review of Systems: As mentioned in the history of present illness. All other systems reviewed and are negative. Past Medical History:  Diagnosis Date   Atrial fibrillation with rapid ventricular response (HCC)    BPH (benign prostatic hyperplasia)    CVA (cerebral infarction)    Dysrhythmia    GERD (gastroesophageal reflux disease)    History of stroke 05/26/2019   HNP (herniated nucleus pulposus), lumbar 01/09/2015   Hypertension    Hypokalemia    Hypomagnesemia 05/26/2019   Stroke (HCC)    Left foot always feel 2 x larger, walks with a limp.   Past Surgical History:  Procedure Laterality Date   ESOPHAGOGASTRODUODENOSCOPY (EGD) WITH PROPOFOL N/A 01/06/2023   Procedure:  ESOPHAGOGASTRODUODENOSCOPY (EGD) WITH PROPOFOL;  Surgeon: Wyline Mood, MD;  Location: [facility] ENDOSCOPY;  Service: Gastroenterology;  Laterality: N/A;  Wants close to 9 AM arrival   LUMBAR LAMINECTOMY/DECOMPRESSION MICRODISCECTOMY Right 01/09/2015   Procedure: LUMBAR LAMINECTOMY/DECOMPRESSION MICRODISCECTOMY 1 LEVEL;  Surgeon: Donalee Citrin, MD;  Location: [facility] NEURO ORS;  Service: Neurosurgery;  Laterality: Right;  LUMBAR LAMINECTOMY/DECOMPRESSION MICRODISCECTOMY 1 LEVEL RIGHT LUMBAR 2-3   TONSILLECTOMY     Social History:  reports that he has quit smoking. His smoking use included cigarettes. He has never used smokeless tobacco. He reports that he does not currently use alcohol. He reports that he does not use drugs.  No Known Allergies  Family History  Problem Relation Age of Onset   Pancreatic cancer Mother     Prior to Admission medications   Medication Sig Start Date End Date Taking? Authorizing Provider  apixaban (ELIQUIS) 5 MG TABS tablet Take 1 tablet (5 mg total) by mouth 2 (two) times daily for 30 days. 12/22/18 11/12/22  Sabas Sous, MD  diltiazem (CARDIZEM CD) 240 MG 24 hr capsule Take 1 capsule (240 mg total) by mouth daily. 12/01/20   Fenton, Clint R, PA  furosemide (LASIX) 40 MG tablet Take 1 tablet (40 mg total) by mouth daily. 03/21/22   Tyrone Nine, MD  omeprazole (PRILOSEC) 40 MG capsule Take 40 mg by mouth daily.    [provider]  pregabalin (LYRICA) 200 MG capsule Take 200 mg by mouth 3 (three) times daily. 02/21/23   [provider]  spironolactone (ALDACTONE) 50 MG tablet Take 50 mg by mouth 2 (two) times daily. 03/06/22  [provider]    Physical Exam: Vitals:   05/08/23 1300 05/08/23 1330 05/08/23 1400 05/08/23 1430  BP: (!) 141/73 (!) 146/80 (!) 149/72 138/77  Pulse: 64 76 70 70  Resp:      Temp:      TempSrc:      SpO2: 98% 99% 99% 98%  Weight:      Height:       Physical Exam Constitutional:      Appearance: He is normal weight.   HENT:     Head: Normocephalic and atraumatic.     Nose: Nose normal.     Mouth/Throat:     Mouth: Mucous membranes are moist.  Eyes:     Extraocular Movements: Extraocular movements intact.     Pupils: Pupils are equal, round, and reactive to light.  Cardiovascular:     Rate and Rhythm: Normal rate and regular rhythm.  Pulmonary:     Effort: Pulmonary effort is normal.  Abdominal:     General: Bowel sounds are normal.  Musculoskeletal:        General: Normal range of motion.  Skin:    General: Skin is dry.  Neurological:     General: No focal deficit present.  Psychiatric:        Mood and Affect: Mood normal.     Data Reviewed:  There are no new results to review at this time.  Lab Results  Component Value Date   WBC 6.9 05/08/2023   HGB 11.5 (L) 05/08/2023   HCT 32.2 (L) 05/08/2023   MCV 100.0 05/08/2023   PLT 148 (L) 05/08/2023   Last metabolic panel Lab Results  Component Value Date   GLUCOSE 90 05/08/2023   NA 116 (LL) 05/08/2023   K 3.7 05/08/2023   CL 82 (L) 05/08/2023   CO2 18 (L) 05/08/2023   BUN <5 (L) 05/08/2023   CREATININE 0.37 (L) 05/08/2023   GFRNONAA >60 05/08/2023   CALCIUM 8.0 (L) 05/08/2023   PROT 7.3 11/12/2022   ALBUMIN 4.1 11/12/2022   LABGLOB 3.2 11/12/2022   AGRATIO 1.3 11/12/2022   BILITOT 1.4 (H) 11/12/2022   ALKPHOS 138 (H) 11/12/2022   AST 28 11/12/2022   ALT 16 11/12/2022   ANIONGAP 14 05/08/2023    Assessment and Plan: * Hyponatremia Acute on chronic hyponatremia with sodium 114 today in the setting of decreased p.o. intake, diarrhea in addition to regular Lasix and spironolactone use associated with cirrhotic disease Baseline sodium around 130 Clinically dry on exam Will gently hydrate patient Hold offending agents overnight Serial sodiums every 4 Goal 6-8 mill equivalent change over the next 12 to 24 hours Monitor closely.  Hepatic cirrhosis (HCC) On Lasix and spironolactone chronically Holding for now in the  setting of acute on chronic hyponatremia and mild dehydration Hepatic function panel pending  Weakness Acute on chronic weakness in the setting of decompensated hyponatremia, hepatic cirrhosis, diarrhea Nonfocal neuroexam CT head pending IV fluid hydration PT OT evaluation Follow   Diarrhea Patient reports recurrent watery bowel movements over several days No abdominal pain, nausea or vomiting White count within normal limits Will check GI stool panel x 1 Otherwise monitor  History of alcohol abuse Prior heavy alcohol use including 4-5 beers daily Patient states he quit drinking 2 to 3 months ago Alcohol level pending Will otherwise monitor for now  Atrial fibrillation, chronic (HCC) Stable at present Continue home Cardizem and Eliquis   Greater than 50% was spent in counseling and coordination of care  with patient Total encounter time 80 minutes or more    Advance Care Planning:   Code Status: Full Code   Consults: None   Family Communication: no family at the bedside   Severity of Illness: The appropriate patient status for this patient is INPATIENT. Inpatient status is judged to be reasonable and necessary in order to provide the required intensity of service to ensure the patient's safety. The patient's presenting symptoms, physical exam findings, and initial radiographic and laboratory data in the context of their chronic comorbidities is felt to place them at high risk for further clinical deterioration. Furthermore, it is not anticipated that the patient will be medically stable for discharge from the hospital within 2 midnights of admission.   * I certify that at the point of admission it is my clinical judgment that the patient will require inpatient hospital care spanning beyond 2 midnights from the point of admission due to high intensity of service, high risk for further deterioration and high frequency of surveillance required.*  Author: Floydene Flock,  MD 05/08/2023 2:40 PM  For on call review [email or web address].

## 2023-05-08 NOTE — ED Provider Notes (Signed)
[facility] Provider Note    Event Date/Time   First MD Initiated Contact with Patient 05/08/23 1153     (approximate)   History   Weakness   HPI  Jeffrey Randolph is a 67 y.o. male past medical history significant for atrial fibrillation on Eliquis, BPH, prior CVA, skin cancer undergoing radiation, who presents to the emergency department for generalized weakness.  States that he has had progressively worsening weakness to his legs and feel like they are Jell-O when he walks.  Does endorse being placed on a new medication for restless legs and another medication and he is uncertain of what it is.  Denies any falls or head trauma.  Decreased oral intake and has not been eating or drinking like he normally does.  Normal urine output.  No dysuria.  No fever or chills.  No chest pain or shortness of breath.     Physical Exam   Triage Vital Signs: ED Triage Vitals [05/08/23 1102]  Encounter Vitals Group     BP (!) 131/50     Systolic BP Percentile      Diastolic BP Percentile      Pulse Rate 67     Resp 18     Temp 98.5 F (36.9 C)     Temp Source Oral     SpO2 96 %     Weight 184 lb (83.5 kg)     Height 5\' 9"  (1.753 m)     Head Circumference      Peak Flow      Pain Score 0     Pain Loc      Pain Education      Exclude from Growth Chart     Most recent vital signs: Vitals:   05/08/23 1102 05/08/23 1200  BP: (!) 131/50   Pulse: 67 79  Resp: 18 20  Temp: 98.5 F (36.9 C)   SpO2: 96% 100%    Physical Exam Constitutional:      Appearance: He is well-developed.  HENT:     Head: Atraumatic.  Eyes:     Conjunctiva/sclera: Conjunctivae normal.  Cardiovascular:     Rate and Rhythm: Regular rhythm.  Pulmonary:     Effort: No respiratory distress.  Musculoskeletal:     Cervical back: Normal range of motion.  Skin:    General: Skin is warm.     Comments: Mass to the chest wall  Neurological:     Mental Status: He is alert. Mental status is  at baseline.     IMPRESSION / MDM / ASSESSMENT AND PLAN / ED COURSE  I reviewed the triage vital signs and the nursing notes.  Differential diagnosis including dehydration, electrolyte abnormality, medication side effect, urinary tract infection, dysrhythmia  EKG  I, Corena Herter, the attending physician, personally viewed and interpreted this ECG.   Rate: Normal  Rhythm: Atrial fibrillation  Axis: Normal  Intervals: Normal  ST&T Change: None  No tachycardic or bradycardic dysrhythmias while on cardiac telemetry.  LABS (all labs ordered are listed, but only abnormal results are displayed) Labs interpreted as -    Labs Reviewed  BASIC METABOLIC PANEL - Abnormal; Notable for the following components:      Result Value   Sodium 114 (*)    Chloride 82 (*)    CO2 18 (*)    BUN <5 (*)    Creatinine, Ser 0.37 (*)    Calcium 8.0 (*)    All other components within  normal limits  CBC - Abnormal; Notable for the following components:   RBC 3.22 (*)    Hemoglobin 11.5 (*)    HCT 32.2 (*)    MCH 35.7 (*)    RDW 18.7 (*)    Platelets 148 (*)    nRBC 0.3 (*)    All other components within normal limits  URINALYSIS, ROUTINE W REFLEX MICROSCOPIC - Abnormal; Notable for the following components:   Color, Urine YELLOW (*)    APPearance CLEAR (*)    Hgb urine dipstick MODERATE (*)    Ketones, ur 80 (*)    Protein, ur 30 (*)    All other components within normal limits     MDM  Found to have critically low hyponatremia with a sodium of 114, likely secondary to hypovolemia given decreased oral intake.  Possible medication side effect.  Given 1 L of normal saline.  CO2 is 18.  Normal anion gap.  Anemia but hemoglobin is stable.  Does have mild thrombocytopenia.  On chart review has a history of cirrhosis, have a low suspicion that this is just secondary to cirrhosis hyponatremia.  Consulted hospitalist for admission for hyponatremia and generalized weakness.     PROCEDURES:  Critical Care performed: yes  .Critical Care  Performed by: Corena Herter, MD Authorized by: Corena Herter, MD   Critical care provider statement:    Critical care time (minutes):  30   Critical care time was exclusive of:  Separately billable procedures and treating other patients   Critical care was necessary to treat or prevent imminent or life-threatening deterioration of the following conditions:  Metabolic crisis   Critical care was time spent personally by me on the following activities:  Development of treatment plan with patient or surrogate, discussions with consultants, evaluation of patient's response to treatment, examination of patient, ordering and review of laboratory studies, ordering and review of radiographic studies, ordering and performing treatments and interventions, pulse oximetry, re-evaluation of patient's condition and review of old charts   Patient's presentation is most consistent with acute presentation with potential threat to life or bodily function.   MEDICATIONS ORDERED IN ED: Medications  sodium chloride 0.9 % bolus 1,000 mL (has no administration in time range)    FINAL CLINICAL IMPRESSION(S) / ED DIAGNOSES   Final diagnoses:  Weakness  Hyponatremia     Rx / DC Orders   ED Discharge Orders     None        Note:  This document was prepared using Dragon voice recognition software and may include unintentional dictation errors.   Corena Herter, MD 05/08/23 1249

## 2023-05-08 NOTE — Assessment & Plan Note (Signed)
Prior heavy alcohol use including 4-5 beers daily Patient states he quit drinking 2 to 3 months ago Alcohol level pending Will otherwise monitor for now

## 2023-05-08 NOTE — ED Triage Notes (Signed)
Pt presents to the ED via [facility] from home. Pt states that he has been weak, dizzy and his legs occasionally "give out" on him and he falls. Pt states that this has been going on for several weeks. Pt states that he lives alone.

## 2023-05-08 NOTE — Progress Notes (Signed)
T bili 2.9 in setting of prior cholelithiasis No reported abd pain  Will check RUQ u/s x 1 Monitor

## 2023-05-08 NOTE — ED Triage Notes (Addendum)
First nurse note: Pt to ED [facility] from home for generalized weakness x1 week. Recent falls, denies hitting head. +blood thinners. Pt has skin cancer 20 R AC, 300 NS PTA

## 2023-05-08 NOTE — ED Notes (Signed)
Dr Arnoldo Morale notified of NA 114. Orders to be placed as needed

## 2023-05-08 NOTE — Assessment & Plan Note (Signed)
Acute on chronic hyponatremia with sodium 114 today in the setting of decreased p.o. intake, diarrhea in addition to regular Lasix and spironolactone use associated with cirrhotic disease Baseline sodium around 130 Clinically dry on exam Will gently hydrate patient Hold offending agents overnight Serial sodiums every 4 Goal 6-8 mill equivalent change over the next 12 to 24 hours Monitor closely.

## 2023-05-09 ENCOUNTER — Inpatient Hospital Stay: Payer: 59

## 2023-05-09 DIAGNOSIS — E871 Hypo-osmolality and hyponatremia: Secondary | ICD-10-CM | POA: Diagnosis not present

## 2023-05-09 LAB — SODIUM, URINE, RANDOM: Sodium, Ur: 107 mmol/L

## 2023-05-09 LAB — OSMOLALITY, URINE: Osmolality, Ur: 358 mOsm/kg (ref 300–900)

## 2023-05-09 MED ORDER — SODIUM BICARBONATE 650 MG PO TABS
650.0000 mg | ORAL_TABLET | Freq: Two times a day (BID) | ORAL | Status: DC
  Administered 2023-05-10 – 2023-05-11 (×3): 650 mg via ORAL
  Filled 2023-05-09 (×3): qty 1

## 2023-05-09 MED ORDER — POTASSIUM CHLORIDE CRYS ER 20 MEQ PO TBCR
40.0000 meq | EXTENDED_RELEASE_TABLET | Freq: Once | ORAL | Status: AC
  Administered 2023-05-09: 40 meq via ORAL
  Filled 2023-05-09: qty 2

## 2023-05-09 MED ORDER — SODIUM BICARBONATE 4.2 % IV SOLN: 50.0000 meq | Freq: Once | INTRAVENOUS | Status: DC

## 2023-05-09 MED ORDER — SPIRONOLACTONE 25 MG PO TABS
25.0000 mg | ORAL_TABLET | Freq: Every day | ORAL | Status: DC
  Administered 2023-05-09 – 2023-05-12 (×4): 25 mg via ORAL
  Filled 2023-05-09 (×4): qty 1

## 2023-05-09 MED ORDER — SODIUM CHLORIDE 1 G PO TABS
1.0000 g | ORAL_TABLET | Freq: Three times a day (TID) | ORAL | Status: DC
  Administered 2023-05-09 – 2023-05-12 (×10): 1 g via ORAL
  Filled 2023-05-09 (×10): qty 1

## 2023-05-09 MED ORDER — SODIUM BICARBONATE 8.4 % IV SOLN
Freq: Once | INTRAVENOUS | Status: DC
  Filled 2023-05-09: qty 1000

## 2023-05-09 MED ORDER — SODIUM CHLORIDE 0.9 % IV SOLN: INTRAVENOUS | Status: DC | PRN

## 2023-05-09 MED ORDER — ALBUMIN HUMAN 25 % IV SOLN
25.0000 g | Freq: Two times a day (BID) | INTRAVENOUS | Status: AC
  Administered 2023-05-09 – 2023-05-10 (×4): 25 g via INTRAVENOUS
  Filled 2023-05-09 (×4): qty 100

## 2023-05-09 MED ORDER — FUROSEMIDE 20 MG PO TABS
40.0000 mg | ORAL_TABLET | Freq: Every day | ORAL | Status: DC
  Administered 2023-05-09 – 2023-05-12 (×4): 40 mg via ORAL
  Filled 2023-05-09 (×4): qty 2

## 2023-05-09 MED ORDER — FUROSEMIDE 10 MG/ML IJ SOLN
40.0000 mg | Freq: Once | INTRAMUSCULAR | Status: AC
  Administered 2023-05-09: 40 mg via INTRAVENOUS
  Filled 2023-05-09: qty 4

## 2023-05-09 NOTE — Progress Notes (Signed)
PHARMACY CONSULT NOTE  Pharmacy Consult for Electrolyte Monitoring and Replacement   Recent Labs: Potassium (mmol/L)  Date Value  05/09/2023 3.3 (L)   Magnesium (mg/dL)  Date Value  32/44/0102 1.8   Calcium (mg/dL)  Date Value  72/53/6644 7.9 (L)   Albumin (g/dL)  Date Value  03/47/4259 3.3 (L)  11/12/2022 4.1   Sodium (mmol/L)  Date Value  05/09/2023 124 (L)  11/12/2022 133 (L)     Assessment: 67 y.o. male past medical history significant for atrial fibrillation on Eliquis, BPH, prior CVA, skin cancer undergoing radiation, who presented w/ hyponatremia   Goal of Therapy:  Electrolytes WNL  Plan:  ---40 meq po KCl x 1 ---recheck electrolytes in am  Lowella Bandy ,PharmD Clinical Pharmacist 05/09/2023 7:18 AM

## 2023-05-09 NOTE — Evaluation (Signed)
Physical Therapy Evaluation Patient Details Name: Jeffrey Randolph MRN: [id number] DOB: 1956/05/05 Today's Date: 05/09/2023  History of Present Illness  67 y/o male presented to ED on 05/08/23 for generalized weakness, dizziness, and legs "giving out". Admitted for acute on chronic hyponatremia. PMH: Afib, hepatic cirrhosis, HTN, CVA  Clinical Impression  Patient admitted with the above. PTA, patient lives alone and reports independence with mobility but endorses numerous falls in past 6 months (increased number in the past 2 weeks). Neighbor present and assisted with information. Patient presents with weakness, impaired balance, and decreased activity tolerance. Overall, patient is functioning at min guard level with no AD. No overt LOB noted during ambulation but does demonstrate mild balance deficits. No instance of knee buckling during mobility. Encouraged use of cane outdoors and in community for safety and to reduce fall risk. Patient will benefit from skilled PT services during acute stay to address listed deficits.     If plan is discharge home, recommend the following: Assist for transportation;Assistance with cooking/housework;Help with stairs or ramp for entrance   Can travel by private vehicle        Equipment Recommendations Cane  Recommendations for Other Services       Functional Status Assessment Patient has had a recent decline in their functional status and demonstrates the ability to make significant improvements in function in a reasonable and predictable amount of time.     Precautions / Restrictions Precautions Precautions: Fall Restrictions Weight Bearing Restrictions: No      Mobility  Bed Mobility Overal bed mobility: Needs Assistance Bed Mobility: Supine to Sit     Supine to sit: Supervision     General bed mobility comments: increased time to come to EOB    Transfers Overall transfer level: Needs assistance Equipment used: None Transfers: Sit to/from  Stand Sit to Stand: Min guard           General transfer comment: increased time and effort to stand from EOB and recliner chair. No assistance required    Ambulation/Gait Ambulation/Gait assistance: Min guard Gait Distance (Feet): 80 Feet Assistive device: None Gait Pattern/deviations: Step-through pattern, Decreased stride length, Trunk flexed Gait velocity: decreased     General Gait Details: min guard for safety. No overt LOB.  Stairs            Wheelchair Mobility     Tilt Bed    Modified Rankin (Stroke Patients Only)       Balance Overall balance assessment: History of Falls, Mild deficits observed, not formally tested                                           Pertinent Vitals/Pain Pain Assessment Pain Assessment: No/denies pain    Home Living Family/patient expects to be discharged to:: Private residence Living Arrangements: Alone Available Help at Discharge: Friend(s);Available PRN/intermittently Type of Home: House Home Access: Stairs to enter   Entrance Stairs-Number of Steps: 1   Home Layout: One level Home Equipment: None      Prior Function Prior Level of Function : Independent/Modified Independent;History of Falls (last six months)             Mobility Comments: reports numerous falls in past 6 months (specifically >3-4 in the past 2 weeks)       Hand Dominance        Extremity/Trunk Assessment   Upper Extremity Assessment  Upper Extremity Assessment: Defer to OT evaluation    Lower Extremity Assessment Lower Extremity Assessment: Generalized weakness    Cervical / Trunk Assessment Cervical / Trunk Assessment: Normal  Communication   Communication: No difficulties  Cognition Arousal/Alertness: Awake/alert Behavior During Therapy: WFL for tasks assessed/performed Overall Cognitive Status: Within Functional Limits for tasks assessed                                           General Comments      Exercises     Assessment/Plan    PT Assessment Patient needs continued PT services  PT Problem List Decreased strength;Decreased activity tolerance;Decreased balance;Decreased mobility;Decreased safety awareness;Decreased knowledge of precautions       PT Treatment Interventions DME instruction;Gait training;Functional mobility training;Therapeutic activities;Therapeutic exercise;Balance training;Stair training;Patient/family education    PT Goals (Current goals can be found in the Care Plan section)  Acute Rehab PT Goals Patient Stated Goal: to go home PT Goal Formulation: With patient Time For Goal Achievement: 05/23/23 Potential to Achieve Goals: Good    Frequency Min 1X/week     Co-evaluation               AM-PAC PT "6 Clicks" Mobility  Outcome Measure Help needed turning from your back to your side while in a flat bed without using bedrails?: None Help needed moving from lying on your back to sitting on the side of a flat bed without using bedrails?: A Little Help needed moving to and from a bed to a chair (including a wheelchair)?: A Little Help needed standing up from a chair using your arms (e.g., wheelchair or bedside chair)?: A Little Help needed to walk in hospital room?: A Little Help needed climbing 3-5 steps with a railing? : A Little 6 Click Score: 19    End of Session   Activity Tolerance: Patient tolerated treatment well Patient left: in chair;with call bell/phone within reach Nurse Communication: Mobility status PT Visit Diagnosis: Unsteadiness on feet (R26.81);Muscle weakness (generalized) (M62.81);History of falling (Z91.81);Repeated falls (R29.6)    Time: 2725-3664 PT Time Calculation (min) (ACUTE ONLY): 19 min   Charges:   PT Evaluation $PT Eval Low Complexity: 1 Low   PT General Charges $$ ACUTE PT VISIT: 1 Visit         Maylon Peppers, PT, DPT Physical Therapist - [facility]  [facility]   Kirrah Mustin A Roseline Ebarb 05/09/2023, 9:08 AM

## 2023-05-09 NOTE — Progress Notes (Signed)
Progress Note   Patient: Jeffrey Randolph ZOX:[id number] DOB: [birth year] DOA: 05/08/2023     1 DOS: the patient was seen and examined on 05/09/2023   Brief hospital course: No notes on file  Assessment and Plan:   Acute on Chronic Hyponatremia -  beer potomania, poor nutrition, and volume overload: Sodium is down to 120 mmol/L with baseline ~ 130 mmol/L. - S/P 24 hours of fluids. - Stop NS.  - Start Lasix 40 mg daily and Spironolactone 25 mg daily with Albumin 25g BID x 2 days. - If sodium levels do not improve, will give hypertonic saline and consult Nephrology.  Hepatic cirrhosis (HCC) On Lasix and spironolactone chronically Holding for now in the setting of acute on chronic hyponatremia and mild dehydration Hepatic function panel pending  Weakness Acute on chronic weakness in the setting of decompensated hyponatremia, hepatic cirrhosis, diarrhea Nonfocal neuroexam CT head pending IV fluid hydration PT OT evaluation Follow   Diarrhea Patient reports recurrent watery bowel movements over several days No abdominal pain, nausea or vomiting White count within normal limits Will check GI stool panel x 1 Otherwise monitor  History of alcohol abuse Prior heavy alcohol use including 4-5 beers daily Patient states he quit drinking 2 to 3 months ago Alcohol level pending Will otherwise monitor for now  Atrial fibrillation, chronic (HCC) Stable at present Continue home Cardizem and Eliquis        Subjective:  Mr. Huss did not eat much breakfast. I changed diet to regular and encouraged him to eat more. UO is 1.3L x 24 hours. He denies any headaches, dizziness, chest pain, or shortness of breath.  Physical Exam: Vitals:   05/09/23 1441 05/09/23 1900 05/09/23 1919 05/09/23 2000  BP: 121/61   120/62  Pulse: 73 75 (!) 57 72  Resp: 15 13 15 14   Temp:      TempSrc:      SpO2: 98% 99% 97% 97%  Weight:      Height:       Physical Exam Constitutional:      General: He  is in acute distress.     Comments: Extravascularly overloaded  HENT:     Head: Normocephalic and atraumatic.     Mouth/Throat:     Pharynx: Oropharynx is clear.  Eyes:     Extraocular Movements: Extraocular movements intact.     Pupils: Pupils are equal, round, and reactive to light.  Cardiovascular:     Rate and Rhythm: Normal rate and regular rhythm.     Pulses: Normal pulses.     Heart sounds: Normal heart sounds.  Pulmonary:     Effort: Pulmonary effort is normal.     Breath sounds: Normal breath sounds.  Abdominal:     General: There is no distension.     Palpations: Abdomen is soft.     Tenderness: There is no abdominal tenderness.  Musculoskeletal:     Cervical back: Normal range of motion and neck supple.  Skin:    General: Skin is warm and dry.     Capillary Refill: Capillary refill takes less than 2 seconds.  Neurological:     General: No focal deficit present.  Psychiatric:     Comments: Little anxious     Data Reviewed: Lab Results  Component Value Date   WBC 3.8 (L) 05/09/2023   HGB 11.9 (L) 05/09/2023   HCT 33.1 (L) 05/09/2023   MCV 100.3 (H) 05/09/2023   PLT 136 (L) 05/09/2023   Last metabolic panel  Lab Results  Component Value Date   GLUCOSE 84 05/09/2023   NA 121 (L) 05/09/2023   K 3.3 (L) 05/09/2023   CL 91 (L) 05/09/2023   CO2 21 (L) 05/09/2023   BUN <5 (L) 05/09/2023   CREATININE 0.46 (L) 05/09/2023   GFRNONAA >60 05/09/2023   CALCIUM 7.9 (L) 05/09/2023   PROT 6.3 (L) 05/09/2023   ALBUMIN 3.3 (L) 05/09/2023   LABGLOB 3.2 11/12/2022   AGRATIO 1.3 11/12/2022   BILITOT 2.7 (H) 05/09/2023   ALKPHOS 83 05/09/2023   AST 28 05/09/2023   ALT 16 05/09/2023   ANIONGAP 12 05/09/2023     Family Communication: Spoke with patient only.  Disposition: Status is: Inpatient Remains inpatient appropriate because: needs IV diuretics.  Planned Discharge Destination: Home    Time spent: >60 minutes  Author: Baldwin Jamaica, MD 05/09/2023 8:31  PM  For on call review [email or web address].

## 2023-05-09 NOTE — Evaluation (Signed)
Occupational Therapy Evaluation Patient Details Name: Jeffrey Randolph MRN: [id number] DOB: [birth year] Today's Date: 05/09/2023   History of Present Illness 67 y/o male presented to ED on 05/08/23 for generalized weakness, dizziness, and legs "giving out". Admitted for acute on chronic hyponatremia. PMH: Afib, hepatic cirrhosis, HTN, CVA   Clinical Impression   Jeffrey Randolph was seen for OT/PT co-evaluation this date. Prior to hospital admission, pt was IND. Pt lives alone, neighbours/friends assist with rides to grocery store. Pt currently requires SBA for ADL t/f, intermittently reaches out for UE support. Reports frequent falls at home, denies room in house to use a RW. Pt would benefit from skilled OT to address noted impairments and functional limitations (see below for any additional details). Upon hospital discharge, recommend OT follow up.   Recommendations for follow up therapy are one component of a multi-disciplinary discharge planning process, led by the attending physician.  Recommendations may be updated based on patient status, additional functional criteria and insurance authorization.   Assistance Recommended at Discharge PRN  Patient can return home with the following Help with stairs or ramp for entrance    Functional Status Assessment  Patient has had a recent decline in their functional status and demonstrates the ability to make significant improvements in function in a reasonable and predictable amount of time.  Equipment Recommendations       Recommendations for Other Services       Precautions / Restrictions Precautions Precautions: Fall Restrictions Weight Bearing Restrictions: No      Mobility Bed Mobility Overal bed mobility: Modified Independent                  Transfers Overall transfer level: Needs assistance Equipment used: None Transfers: Sit to/from Stand Sit to Stand: Min guard                  Balance Overall balance assessment: Needs  assistance, History of Falls Sitting-balance support: No upper extremity supported, Feet supported Sitting balance-Leahy Scale: Normal     Standing balance support: No upper extremity supported, During functional activity Standing balance-Leahy Scale: Fair                             ADL either performed or assessed with clinical judgement   ADL Overall ADL's : Needs assistance/impaired                                       General ADL Comments: MOD I don B socks seated EOB. SBA for ADL t/f, intermittently reaches out for UE support.      Pertinent Vitals/Pain Pain Assessment Pain Assessment: No/denies pain     Hand Dominance     Extremity/Trunk Assessment Upper Extremity Assessment Upper Extremity Assessment: Overall WFL for tasks assessed   Lower Extremity Assessment Lower Extremity Assessment: Generalized weakness   Cervical / Trunk Assessment Cervical / Trunk Assessment: Normal   Communication Communication Communication: No difficulties   Cognition Arousal/Alertness: Awake/alert Behavior During Therapy: WFL for tasks assessed/performed Overall Cognitive Status: Within Functional Limits for tasks assessed                                                  Home Living Family/patient  expects to be discharged to:: Private residence Living Arrangements: Alone Available Help at Discharge: Friend(s);Available PRN/intermittently Type of Home: House Home Access: Stairs to enter Entrance Stairs-Number of Steps: 1   Home Layout: One level     Bathroom Shower/Tub: Sponge bathes at baseline         Home Equipment: None          Prior Functioning/Environment Prior Level of Function : Independent/Modified Independent;History of Falls (last six months)             Mobility Comments: reports numerous falls in past 6 months (specifically >3-4 in the past 2 weeks)          OT Problem List: Decreased  activity tolerance;Impaired balance (sitting and/or standing)      OT Treatment/Interventions: Therapeutic exercise;Self-care/ADL training;Energy conservation;DME and/or AE instruction;Therapeutic activities;Patient/family education;Balance training    OT Goals(Current goals can be found in the care plan section) Acute Rehab OT Goals Patient Stated Goal: to go home OT Goal Formulation: With patient Time For Goal Achievement: 05/23/23 Potential to Achieve Goals: Good ADL Goals Pt Will Perform Grooming: Independently;standing Pt Will Perform Lower Body Dressing: Independently;sit to/from stand Pt Will Transfer to Toilet: Independently;ambulating;regular height toilet  OT Frequency: Min 1X/week    Co-evaluation              AM-PAC OT "6 Clicks" Daily Activity     Outcome Measure Help from another person eating meals?: None Help from another person taking care of personal grooming?: None Help from another person toileting, which includes using toliet, bedpan, or urinal?: A Little Help from another person bathing (including washing, rinsing, drying)?: A Little Help from another person to put on and taking off regular upper body clothing?: None Help from another person to put on and taking off regular lower body clothing?: A Little 6 Click Score: 21   End of Session Nurse Communication: Mobility status  Activity Tolerance: Patient tolerated treatment well Patient left: in chair;with call bell/phone within reach  OT Visit Diagnosis: Unsteadiness on feet (R26.81)                Time: 9562-1308 OT Time Calculation (min): 13 min Charges:  OT General Charges $OT Visit: 1 Visit OT Evaluation $OT Eval Low Complexity: 1 Low  Kathie Dike, M.S. OTR/L  05/09/23, 9:20 AM  ascom [phone]

## 2023-05-09 NOTE — Progress Notes (Signed)
Patient's neighbor spoke to this RN and stated that pt rents a dwelling from him on his property. He states that pt lives alone and has "no one"; that his adult children do not come around or talk to him anymore. Neighbor also states that patient currently drinks alcohol, but is unsure about how much; reports that he had stopped drinking in the past but has started drinking again.This RN has inquired of patient when he was alone if he currently drinks alcohol, and pt denies current alcohol use.

## 2023-05-09 NOTE — TOC Initial Note (Addendum)
Transition of Care [facility]) - Initial/Assessment Note    Patient Details  Name: Jeffrey Randolph MRN: [id number] Date of Birth: December 01, 1955  Transition of Care [facility]) CM/SW Contact:    Liliana Cline, LCSW Phone Number: 05/09/2023, 2:39 PM  Clinical Narrative:                 Spoke to patient by phone due to isolation precautions. Patient lives alone. Patient has a moped that he can use for short distances at his baseline. He states his friend Gerlene Burdock takes him to appointments. PCP is Lonie Peak. Pharmacy is [facility].  Patient states he thinks he has a cane at home, he wants to check before one is ordered. Patient agreed to let his nurse know if he needs a cane ordered prior to discharge. Patient states he agrees to [facility], confirmed home address. Per chart review, patient had [facility] in the past. CSW asked Kandee Keen with [facility] who accepted the referral.  Patient states he feels safe returning home alone when medically ready. Patient declines SA resources at this time, states he does not need them. Patient states his friend will pick him up when he is discharged, however his friend does not drive in the dark.     Expected Discharge Plan: Home w Home Health Services Barriers to Discharge: Continued Medical Work up   Patient Goals and CMS Choice Patient states their goals for this hospitalization and ongoing recovery are:: agrees to [facility] CMS Medicare.gov Compare Post Acute Care list provided to:: Patient Choice offered to / list presented to : Patient      Expected Discharge Plan and Services       Living arrangements for the past 2 months: Single Family Home                           HH Arranged: PT, OT HH Agency: [facility] Date HH Agency Contacted: 05/09/23   Representative spoke with at [facility] Agency: Barbara Cower  Prior Living Arrangements/Services Living arrangements for the past 2 months: Single Family Home Lives with:: Self Patient language and need for  interpreter reviewed:: Yes Do you feel safe going back to the place where you live?: Yes      Need for Family Participation in Patient Care: Yes (Comment) Care giver support system in place?: Yes (comment)   Criminal Activity/Legal Involvement Pertinent to Current Situation/Hospitalization: No - Comment as needed  Activities of Daily Living Home Assistive Devices/Equipment: None ADL Screening (condition at time of admission) Patient's cognitive ability adequate to safely complete daily activities?: Yes Is the patient deaf or have difficulty hearing?: No Does the patient have difficulty seeing, even when wearing glasses/contacts?: No Does the patient have difficulty concentrating, remembering, or making decisions?: No Patient able to express need for assistance with ADLs?: Yes Does the patient have difficulty dressing or bathing?: No Independently performs ADLs?: Yes (appropriate for developmental age) Does the patient have difficulty walking or climbing stairs?: No Weakness of Legs: Both Weakness of Arms/Hands: Both  Permission Sought/Granted Permission sought to share information with : Oceanographer granted to share information with : Yes, Verbal Permission Granted     Permission granted to share info w AGENCY: Home Health, DME agencies if needed        Emotional Assessment       Orientation: : Oriented to Self, Oriented to Situation, Oriented to Place, Oriented to  Time Alcohol / Substance Use: Not Applicable  Psych Involvement: No (comment)  Admission diagnosis:  Hyponatremia [E87.1] Weakness [R53.1] Patient Active Problem List   Diagnosis Date Noted   Hyponatremia 05/08/2023   History of alcohol abuse 05/08/2023   Diarrhea 05/08/2023   Weakness 05/08/2023   Ascitic fluid 03/20/2022   Hepatic cirrhosis (HCC) 03/20/2022   Atrial fibrillation, chronic (HCC) 03/20/2022   Fall at home, initial encounter 03/20/2022   Chest pain 03/20/2022    Secondary hypercoagulable state (HCC) 10/20/2019   Hypertension 05/26/2019   Hypokalemia 05/26/2019   History of stroke 05/26/2019   Hypomagnesemia 05/26/2019   Atrial fibrillation (HCC) 05/25/2019   Lumbar spondylosis 08/14/2015   Spinal stenosis of lumbar region 04/20/2015   HNP (herniated nucleus pulposus), lumbar 01/09/2015   Displacement of lumbar intervertebral disc without myelopathy 12/08/2014   PCP:  Lonie Peak, PA-C Pharmacy:   [facility] - [place], [place] - [address] [place] [address] Phone: [phone] Fax: [phone]     Social Determinants of Health (SDOH) Social History: SDOH Screenings   Food Insecurity: No Food Insecurity (05/08/2023)  Housing: Low Risk  (05/08/2023)  Transportation Needs: No Transportation Needs (05/08/2023)  Utilities: Not At Risk (05/08/2023)  Tobacco Use: Medium Risk (05/08/2023)   SDOH Interventions:     Readmission Risk Interventions    05/09/2023    2:37 PM  Readmission Risk Prevention Plan  Post Dischage Appt Complete  Medication Screening Complete  Transportation Screening Complete

## 2023-05-10 LAB — BASIC METABOLIC PANEL
Anion gap: 8 (ref 5–15)
BUN: <5 mg/dL — ABNORMAL LOW (ref 8–23)
CO2: 30 mmol/L (ref 22–32)
Calcium: 8.9 mg/dL (ref 8.9–10.3)
Chloride: 87 mmol/L — ABNORMAL LOW (ref 98–111)
Creatinine, Ser: 0.46 mg/dL — ABNORMAL LOW (ref 0.61–1.24)
GFR, Estimated: >60 mL/min (ref >60)
Glucose, Bld: 114 mg/dL — ABNORMAL HIGH (ref 70–99)
Potassium: 2.8 mmol/L — ABNORMAL LOW (ref 3.5–5.1)
Sodium: 125 mmol/L — ABNORMAL LOW (ref 135–145)

## 2023-05-10 LAB — POTASSIUM
Potassium: 3.2 mmol/L — ABNORMAL LOW (ref 3.5–5.1)
Potassium: 3.7 mmol/L (ref 3.5–5.1)
Potassium: 4.1 mmol/L (ref 3.5–5.1)

## 2023-05-10 LAB — MAGNESIUM: Magnesium: 1.8 mg/dL (ref 1.7–2.4)

## 2023-05-10 MED ORDER — FUROSEMIDE 10 MG/ML IJ SOLN
40.0000 mg | Freq: Once | INTRAMUSCULAR | Status: AC
  Administered 2023-05-10: 40 mg via INTRAVENOUS
  Filled 2023-05-10: qty 4

## 2023-05-10 MED ORDER — POTASSIUM CHLORIDE CRYS ER 20 MEQ PO TBCR
40.0000 meq | EXTENDED_RELEASE_TABLET | Freq: Once | ORAL | Status: AC
  Administered 2023-05-10: 40 meq via ORAL
  Filled 2023-05-10: qty 2

## 2023-05-10 MED ORDER — POTASSIUM CHLORIDE CRYS ER 20 MEQ PO TBCR
20.0000 meq | EXTENDED_RELEASE_TABLET | Freq: Once | ORAL | Status: AC
  Administered 2023-05-10: 20 meq via ORAL
  Filled 2023-05-10: qty 1

## 2023-05-10 MED ORDER — POTASSIUM CHLORIDE CRYS ER 20 MEQ PO TBCR
20.0000 meq | EXTENDED_RELEASE_TABLET | Freq: Two times a day (BID) | ORAL | Status: DC
  Administered 2023-05-10: 20 meq via ORAL
  Filled 2023-05-10: qty 1

## 2023-05-10 MED ORDER — MORPHINE SULFATE (PF) 2 MG/ML IV SOLN: 1.0000 mg | INTRAVENOUS | Status: DC | PRN

## 2023-05-10 MED ORDER — MAGNESIUM SULFATE IN D5W 1-5 GM/100ML-% IV SOLN
1.0000 g | Freq: Once | INTRAVENOUS | Status: DC
  Filled 2023-05-10: qty 100

## 2023-05-10 MED ORDER — MAGNESIUM SULFATE 2 GM/50ML IV SOLN
2.0000 g | Freq: Once | INTRAVENOUS | Status: AC
  Administered 2023-05-10: 2 g via INTRAVENOUS
  Filled 2023-05-10: qty 50

## 2023-05-10 MED ORDER — POTASSIUM CHLORIDE 10 MEQ/100ML IV SOLN
10.0000 meq | INTRAVENOUS | Status: DC
  Administered 2023-05-10: 10 meq via INTRAVENOUS
  Filled 2023-05-10 (×4): qty 100

## 2023-05-10 NOTE — Progress Notes (Signed)
PT Cancellation Note  Patient Details Name: Jeffrey Randolph MRN: [id number] DOB: 12-02-1955   Cancelled Treatment:    Reason Eval/Treat Not Completed: Other (comment): Patient just received lunch and eating upon arrival. Will come back at later time/date.    Howie Ill, PT, DPT 05/10/23 1:41 PM

## 2023-05-10 NOTE — Progress Notes (Addendum)
PHARMACY CONSULT NOTE  Pharmacy Consult for Electrolyte Monitoring and Replacement   Recent Labs: Potassium (mmol/L)  Date Value  05/10/2023 2.8 (L)   Magnesium (mg/dL)  Date Value  16/07/9603 1.8   Calcium (mg/dL)  Date Value  54/06/8118 8.9   Albumin (g/dL)  Date Value  14/78/2956 4.0  11/12/2022 4.1   Phosphorus (mg/dL)  Date Value  21/30/8657 2.6   Sodium (mmol/L)  Date Value  05/10/2023 125 (L)  11/12/2022 133 (L)     Assessment: 67 y.o. male past medical history significant for atrial fibrillation on Eliquis, BPH, prior CVA, skin cancer undergoing radiation, who presented w/ hyponatremia   Goal of Therapy:  Electrolytes WNL  Plan:  Patient on Furosemide 40 mg po daily, spironolactone 25 mg po daily, sodium chloride 1 gm PO TID ---Mag 1.8-  will order Magnesium sulfate 2 gm IV x1 ---K 2.8   MD has ordered KCL IV 10 meq x 4. Will add KCL 20 meq PO x 1 ---recheck electrolytes in am  Angelique Blonder ,PharmD Clinical Pharmacist 05/10/2023 7:37 AM    10:33am Received call from RN. Patient unable to tolerate IV potassium (1 bag given). Will dc IV KCL order and replace with 20mg  KCL po BID x 2 doses. Beena Catano Rodriguez-Guzman PharmD, BCPS 05/10/2023 10:35 AM

## 2023-05-10 NOTE — Progress Notes (Addendum)
Progress Note   Patient: Jeffrey Randolph HYQ:[id number] DOB: 1956/04/10 DOA: 05/08/2023     2 DOS: the patient was seen and examined on 05/10/2023   Brief hospital course: 67 year old male with PMH of Chronic Alcohol Use, Liver Cirrhosis with Ascites (on Lasix at home), Chronic Diastolic Heart Failure, Paroxysmal Atrial Fibrillation who presented to the ED with generalized weakness and acute diarrhea found to have sodium level of 114 mmol/L, 80 ketones, and stool cultures positive for Norovirus.  Assessment and Plan:  Acute on Chronic Hyponatremia in the setting of advanced cirrhosis and portal hypertension - secondary to combination of beer potomania, poor nutrition, and acute volume overload: The progression of cirrhosis and ascites over time has lead to impairment of the kidneys to eliminate solute-free water resulting in chronic hyponatremia (with baseline Na ~ 130 mmol/L).  The acute drop in sodium on admission (Na was 114 mmol/L) was due to a combination of mild hypovolemia from use of diuretics and acute diarrhea as well as poor nutrition and oral intake.  Patient received one day of fluids and now has developed a more hypervolemic dilutional hyponatremia.  The goal now is to increase solute-free water excretion. - 8/1: Received Normal Saline.  Na 114-121 mmol/L. - 8/2: Received Lasix and Spironolactone with Albumin 25g BID.  Na 124-121 mmol/L.  Urine Na 107, Urine Osm 358. - 8/3: Received Lasix and Spironolactone with Albumin 25 g BID.  Na 125-124 mmol/L.  Urine Na 98, Urine Osm 228. - Continue diuretics with Albumin and maintain strict fluid restriction 1200 L/day. - If sodium does not improve, we will consult Nephrology and consider hypertonic saline however we would like to avoid as this may increase his ascites and edema.  Hypokalemia: Low potassium levels can promote development of hepatic encephalopathy by increasing renal ammonia synthesis, so it is important to correct.  Supplementation of  potassium tends to raise sodium levels. - K 2.8 mmol/L, replaced.  Norovirus Gastroenteritis: Diarrhea has resolved.   - Antibiotics are not indicated.  Monitor.  Chronic Diastolic Heart Failure: - Stable.  Paroxysmal Atrial Fibrillation s/p 2020 DCCV: Previously on flecainide. - Continue Diltiazem 240 mg daily for rate control. - Continue Eliquis for anticoagulation.  Essential Hypertension: BP is low normal. - Continue home meds.  Liver Cirrhosis with Ascites: - Continue Lasix and Aldactone.  Chronic Alcohol Use: - Counseled on cessation.  GERD:  - PPI.  Chronic Lower Back Pain: - Continue home pain medications Lyrica 200 mg TID.  Hold Duloxetine until sodium levels are more stable.  Cholelithiasis: - negative murphy's sign.  Moderate Protein Calorie Malnutrition: BMI 25.37 kg/m2. - Continue nutrition as tolerated.  Lumbar Degenerative Disc Disease s/p 2016 right L2-L3 microdiscectomy Documented 2020 CVA 2012 Acute Right Hemisphere Subinsular Hemorrhage  Diet: Regular with fluid restriction. DVT: On Eliquis. Dispo: Stepdown Unit.  Transfer to floors. Discharge Plan: PT/OT evaluation is pending.  Subjective:  Jeffrey Randolph has eaten a lot more over the last 24 hours. Mood is pleasant. We switched IV potassium to oral potassium due to burning at IV site. He has a mild amount of ascites on exam. He denies any headaches, dizziness, chest pain, or shortness of breath.  Physical Exam: Vitals:   05/10/23 0900 05/10/23 1200 05/10/23 1300 05/10/23 1400  BP: (!) 127/59 (!) 116/55 (!) 94/55 111/89  Pulse: 74 67 (!) 51 (!) 59  Resp: (!) 21 17 14 16   Temp:      TempSrc:      SpO2: 99%  100% (!) 89% 93%  Weight:      Height:       Physical Exam Constitutional:      General: He is in acute distress.     Comments: Extravascularly overloaded  HENT:     Head: Normocephalic and atraumatic.     Mouth/Throat:     Pharynx: Oropharynx is clear.  Eyes:     Extraocular  Movements: Extraocular movements intact.     Pupils: Pupils are equal, round, and reactive to light.  Cardiovascular:     Rate and Rhythm: Normal rate and regular rhythm.     Pulses: Normal pulses.     Heart sounds: Normal heart sounds.  Pulmonary:     Effort: Pulmonary effort is normal.     Breath sounds: Normal breath sounds.  Abdominal:     General: There is no distension.     Palpations: Abdomen is soft.     Tenderness: There is no abdominal tenderness.     Comments: Mild ascites.  Musculoskeletal:     Cervical back: Normal range of motion and neck supple.  Skin:    General: Skin is warm and dry.     Capillary Refill: Capillary refill takes less than 2 seconds.  Neurological:     General: No focal deficit present.  Psychiatric:        Mood and Affect: Mood normal.     Comments: Little anxious     Data Reviewed: Lab Results  Component Value Date   WBC 4.0 05/10/2023   HGB 11.8 (L) 05/10/2023   HCT 32.7 (L) 05/10/2023   MCV 98.2 05/10/2023   PLT 146 (L) 05/10/2023   Last metabolic panel Lab Results  Component Value Date   GLUCOSE 114 (H) 05/10/2023   NA 124 (L) 05/10/2023   K 3.2 (L) 05/10/2023   CL 87 (L) 05/10/2023   CO2 30 05/10/2023   BUN <5 (L) 05/10/2023   CREATININE 0.46 (L) 05/10/2023   GFRNONAA >60 05/10/2023   CALCIUM 8.9 05/10/2023   PHOS 2.6 05/10/2023   PROT 6.3 (L) 05/09/2023   ALBUMIN 4.0 05/10/2023   LABGLOB 3.2 11/12/2022   AGRATIO 1.3 11/12/2022   BILITOT 2.7 (H) 05/09/2023   ALKPHOS 83 05/09/2023   AST 28 05/09/2023   ALT 16 05/09/2023   ANIONGAP 8 05/10/2023     Family Communication: Spoke with patient only.  Disposition: Status is: Inpatient Remains inpatient appropriate because: needs IV diuretics.  Planned Discharge Destination: Home    Time spent: >60 minutes  Author: Baldwin Jamaica, MD 05/10/2023 3:12 PM  For on call review [email or web address].

## 2023-05-11 DIAGNOSIS — E871 Hypo-osmolality and hyponatremia: Secondary | ICD-10-CM | POA: Diagnosis not present

## 2023-05-11 LAB — CBC
HCT: 34.5 % — ABNORMAL LOW (ref 39.0–52.0)
Hemoglobin: 11.9 g/dL — ABNORMAL LOW (ref 13.0–17.0)
MCH: 35 pg — ABNORMAL HIGH (ref 26.0–34.0)
MCHC: 34.5 g/dL (ref 30.0–36.0)
MCV: 101.5 fL — ABNORMAL HIGH (ref 80.0–100.0)
Platelets: 144 10*3/uL — ABNORMAL LOW (ref 150–400)
RBC: 3.4 MIL/uL — ABNORMAL LOW (ref 4.22–5.81)
RDW: 19.2 % — ABNORMAL HIGH (ref 11.5–15.5)
WBC: 4 10*3/uL (ref 4.0–10.5)
nRBC: 0 % (ref 0.0–0.2)

## 2023-05-11 LAB — PHOSPHORUS: Phosphorus: 3 mg/dL (ref 2.5–4.6)

## 2023-05-11 LAB — BASIC METABOLIC PANEL WITH GFR
Anion gap: 10 (ref 5–15)
BUN: 5 mg/dL — ABNORMAL LOW (ref 8–23)
CO2: 25 mmol/L (ref 22–32)
Calcium: 9.1 mg/dL (ref 8.9–10.3)
Chloride: 91 mmol/L — ABNORMAL LOW (ref 98–111)
Creatinine, Ser: 0.44 mg/dL — ABNORMAL LOW (ref 0.61–1.24)
GFR, Estimated: >60 mL/min (ref >60)
Glucose, Bld: 119 mg/dL — ABNORMAL HIGH (ref 70–99)
Potassium: 3.7 mmol/L (ref 3.5–5.1)
Sodium: 126 mmol/L — ABNORMAL LOW (ref 135–145)

## 2023-05-11 LAB — POTASSIUM: Potassium: 3.8 mmol/L (ref 3.5–5.1)

## 2023-05-11 LAB — MAGNESIUM: Magnesium: 2 mg/dL (ref 1.7–2.4)

## 2023-05-11 LAB — SODIUM, URINE, RANDOM: Sodium, Ur: 57 mmol/L

## 2023-05-11 LAB — SODIUM: Sodium: 125 mmol/L — ABNORMAL LOW (ref 135–145)

## 2023-05-11 LAB — OSMOLALITY, URINE: Osmolality, Ur: 508 mosm/kg (ref 300–900)

## 2023-05-11 MED ORDER — SODIUM BICARBONATE 650 MG PO TABS
325.0000 mg | ORAL_TABLET | Freq: Two times a day (BID) | ORAL | Status: DC
  Administered 2023-05-11 – 2023-05-12 (×2): 325 mg via ORAL
  Filled 2023-05-11 (×3): qty 0.5

## 2023-05-11 NOTE — Progress Notes (Signed)
[facility] CONSULT NOTE    Date: 05/11/2023                  Patient Name:  Jeffrey Randolph  MRN: [id number]  DOB: 03-Apr-1956  Age / Sex: 67 y.o., male         PCP: Lonie Peak, PA-C                 Service Requesting Consult: Medicine                  Reason for Consult: Hyponatremia            History of Present Illness: Patient is a 67 y.o. male with a PMHx of hypertension, coronary artery disease, congestive heart failure, chronic alcohol abuse, cirrhosis with ascites admitted with the emergency room with weakness and diarrhea.  He was found to have a sodium of 114 on admission.  He received saline at admission which improved his sodium to 121.  Patient was placed on fluid restriction and diuretics.  He was also started on sodium chloride tablets.  Medications: Outpatient medications: Medications Prior to Admission  Medication Sig Dispense Refill Last Dose   apixaban (ELIQUIS) 5 MG TABS tablet Take 1 tablet (5 mg total) by mouth 2 (two) times daily for 30 days. 60 tablet 0 05/07/2023   diltiazem (CARDIZEM CD) 240 MG 24 hr capsule Take 1 capsule (240 mg total) by mouth daily. 30 capsule 11 05/07/2023   DULoxetine (CYMBALTA) 30 MG capsule Take 30 mg by mouth daily.   05/07/2023   omeprazole (PRILOSEC) 40 MG capsule Take 40 mg by mouth daily.   05/07/2023   pregabalin (LYRICA) 200 MG capsule Take 200 mg by mouth 3 (three) times daily.   05/07/2023   rOPINIRole (REQUIP) 0.5 MG tablet Take 0.5 mg by mouth at bedtime.   05/07/2023    Discontinued Meds:   Medications Discontinued During This Encounter  Medication Reason   enoxaparin (LOVENOX) injection 40 mg    furosemide (LASIX) 40 MG tablet Completed Course   spironolactone (ALDACTONE) 50 MG tablet Completed Course   sodium bicarbonate 4.2 % injection 50 mEq Reorder   DULoxetine (CYMBALTA) DR capsule 30 mg    0.9 %  sodium chloride infusion    sodium bicarbonate 150 mEq in dextrose 5 % 1,150 mL infusion     magnesium sulfate IVPB 1 g 100 mL    potassium chloride 10 mEq in 100 mL IVPB    potassium chloride SA (KLOR-CON M) CR tablet 20 mEq    morphine (PF) 2 MG/ML injection 1 mg     Current medications: Current Facility-Administered Medications  Medication Dose Route Frequency Provider Last Rate Last Admin   0.9 %  sodium chloride infusion   Intravenous PRN Baldwin Jamaica, MD   Stopped at 05/10/23 1446   apixaban (ELIQUIS) tablet 5 mg  5 mg Oral BID Floydene Flock, MD   5 mg at 05/11/23 4401   Chlorhexidine Gluconate Cloth 2 % PADS 6 each  6 each Topical Daily Floydene Flock, MD   6 each at 05/11/23 1041   diltiazem (CARDIZEM CD) 24 hr capsule 240 mg  240 mg Oral Daily Floydene Flock, MD   240 mg at 05/11/23 0903   furosemide (LASIX) tablet 40 mg  40 mg Oral Daily Baldwin Jamaica, MD   40 mg at 05/11/23 0902   ondansetron (ZOFRAN) tablet 4 mg  4 mg Oral Q6H PRN Floydene Flock,  MD       Or   ondansetron (ZOFRAN) injection 4 mg  4 mg Intravenous Q6H PRN Floydene Flock, MD       Oral care mouth rinse  15 mL Mouth Rinse PRN Floydene Flock, MD       pantoprazole (PROTONIX) EC tablet 40 mg  40 mg Oral Daily Floydene Flock, MD   40 mg at 05/11/23 8657   pregabalin (LYRICA) capsule 200 mg  200 mg Oral TID Floydene Flock, MD   200 mg at 05/11/23 8469   rOPINIRole (REQUIP) tablet 0.5 mg  0.5 mg Oral QHS Lowella Bandy, RPH   0.5 mg at 05/10/23 2138   sodium bicarbonate tablet 650 mg  650 mg Oral BID Baldwin Jamaica, MD   650 mg at 05/11/23 6295   sodium chloride tablet 1 g  1 g Oral TID WC Baldwin Jamaica, MD   1 g at 05/11/23 1222   spironolactone (ALDACTONE) tablet 25 mg  25 mg Oral Daily Baldwin Jamaica, MD   25 mg at 05/11/23 2841      Allergies: No Known Allergies    Past Medical History: Past Medical History:  Diagnosis Date   Atrial fibrillation with rapid ventricular response (HCC)    BPH (benign prostatic hyperplasia)    CVA (cerebral infarction)    Dysrhythmia    GERD  (gastroesophageal reflux disease)    History of stroke 05/26/2019   HNP (herniated nucleus pulposus), lumbar 01/09/2015   Hypertension    Hypokalemia    Hypomagnesemia 05/26/2019   Stroke (HCC)    Left foot always feel 2 x larger, walks with a limp.     Past Surgical History: Past Surgical History:  Procedure Laterality Date   ESOPHAGOGASTRODUODENOSCOPY (EGD) WITH PROPOFOL N/A 01/06/2023   Procedure: ESOPHAGOGASTRODUODENOSCOPY (EGD) WITH PROPOFOL;  Surgeon: Wyline Mood, MD;  Location: [facility] ENDOSCOPY;  Service: Gastroenterology;  Laterality: N/A;  Wants close to 9 AM arrival   LUMBAR LAMINECTOMY/DECOMPRESSION MICRODISCECTOMY Right 01/09/2015   Procedure: LUMBAR LAMINECTOMY/DECOMPRESSION MICRODISCECTOMY 1 LEVEL;  Surgeon: Donalee Citrin, MD;  Location: [facility] NEURO ORS;  Service: Neurosurgery;  Laterality: Right;  LUMBAR LAMINECTOMY/DECOMPRESSION MICRODISCECTOMY 1 LEVEL RIGHT LUMBAR 2-3   TONSILLECTOMY       Family History: Family History  Problem Relation Age of Onset   Pancreatic cancer Mother      Social History: Social History   Socioeconomic History   Marital status: Single    Spouse name: Not on file   Number of children: Not on file   Years of education: Not on file   Highest education level: Not on file  Occupational History   Not on file  Tobacco Use   Smoking status: Former    Types: Cigarettes   Smokeless tobacco: Never   Tobacco comments:    quit smoking in 1980's  Vaping Use   Vaping status: Never Used  Substance and Sexual Activity   Alcohol use: Not Currently    Comment: stopped drinking about a year ago   Drug use: No   Sexual activity: Not on file  Other Topics Concern   Not on file  Social History Narrative   Not on file   Social Determinants of Health   Financial Resource Strain: Not on file  Food Insecurity: No Food Insecurity (05/08/2023)   Hunger Vital Sign    Worried About Running Out of Food in the Last Year: Never true    Ran Out of Food in the  Last  Year: Never true  Transportation Needs: No Transportation Needs (05/08/2023)   PRAPARE - Administrator, Civil Service (Medical): No    Lack of Transportation (Non-Medical): No  Physical Activity: Not on file  Stress: Not on file  Social Connections: Not on file  Intimate Partner Violence: Not At Risk (05/08/2023)   Humiliation, Afraid, Rape, and Kick questionnaire    Fear of Current or Ex-Partner: No    Emotionally Abused: No    Physically Abused: No    Sexually Abused: No     Review of Systems: As per HPI  Vital Signs: Blood pressure 120/62, pulse (!) 122, temperature 98.2 F (36.8 C), temperature source Oral, resp. rate 17, height 5\' 9"  (1.753 m), weight 81 kg, SpO2 100%.  Weight trends: Filed Weights   05/08/23 1102 05/08/23 1505  Weight: 83.5 kg 81 kg    Physical Exam: Physical Exam: General:  No acute distress  Head:  Normocephalic, atraumatic. Moist oral mucosal membranes  Eyes:  Anicteric  Neck:  Supple  Lungs:   Clear to auscultation, normal effort  Heart:  S1S2 no rubs  Abdomen:   Soft, nontender, bowel sounds present but positive for ascites.  Extremities: Trace peripheral edema.  Neurologic:  Awake, alert, following commands  Skin:  No lesions  Access:     Lab results:  Basic Metabolic Panel: Recent Labs  Lab 05/08/23 1107 05/08/23 1400 05/09/23 0509 05/09/23 1044 05/10/23 0510 05/10/23 0511 05/10/23 0927 05/10/23 1321 05/10/23 1753 05/10/23 2128 05/11/23 0125 05/11/23 0534  NA 114*   < > 124*   < > 125* 125*   < > 124* 123* 125* 125* 126*  K 3.7  --  3.3*  --  2.8* 2.8*  --  3.2* 3.7 4.1 3.8 3.7  CL 82*  --  91*  --  87* 87*  --   --   --   --   --  91*  CO2 18*  --  21*  --  28 30  --   --   --   --   --  25  GLUCOSE 90  --  84  --  114* 114*  --   --   --   --   --  119*  BUN <5*  --  <5*  --  <5* <5*  --   --   --   --   --  5*  CREATININE 0.37*  --  0.46*  --  0.46* 0.46*  --   --   --   --   --  0.44*  CALCIUM 8.0*  --   7.9*  --  8.7* 8.9  --   --   --   --   --  9.1  MG  --   --   --   --   --  1.8  --   --   --   --   --  2.0  PHOS  --   --   --   --  2.6  --   --   --   --   --   --  3.0   < > = values in this interval not displayed.    Creatinine, Ser  Date/Time Value Ref Range Status  05/11/2023 05:34 AM 0.44 (L) 0.61 - 1.24 mg/dL Final  60/45/4098 11:91 AM 0.46 (L) 0.61 - 1.24 mg/dL Final  47/82/9562 13:08 AM 0.46 (L) 0.61 - 1.24 mg/dL Final  65/78/4696 29:52  AM 0.46 (L) 0.61 - 1.24 mg/dL Final  29/56/2130 86:57 AM 0.37 (L) 0.61 - 1.24 mg/dL Final  84/69/6295 28:41 PM 0.58 (L) 0.76 - 1.27 mg/dL Final  32/44/0102 72:53 AM 0.32 (L) 0.61 - 1.24 mg/dL Final  66/44/0347 42:59 AM 0.43 (L) 0.61 - 1.24 mg/dL Final  56/38/7564 33:29 AM 0.65 (L) 0.76 - 1.27 mg/dL Final  51/88/4166 06:30 AM 0.71 (L) 0.76 - 1.27 mg/dL Final  16/10/930 35:57 AM 0.91 0.61 - 1.24 mg/dL Final  32/20/2542 70:62 AM 0.62 0.61 - 1.24 mg/dL Final  37/62/8315 17:61 PM 0.72 0.61 - 1.24 mg/dL Final  60/73/7106 26:94 PM 0.74 0.61 - 1.24 mg/dL Final  85/46/2703 50:09 AM 0.77 0.50 - 1.35 mg/dL Final  38/18/2993 71:69 PM 0.90 0.50 - 1.35 mg/dL Final  67/89/3810 17:51 AM 0.80 0.50 - 1.35 mg/dL Final  02/58/5277 82:42 AM 1.00 0.50 - 1.35 mg/dL Final  35/36/1443 15:40 AM 0.62 0.50 - 1.35 mg/dL Final    CBC: Recent Labs  Lab 05/08/23 1107 05/09/23 0509 05/10/23 0510 05/11/23 0534  WBC 6.9 3.8* 4.0 4.0  HGB 11.5* 11.9* 11.8* 11.9*  HCT 32.2* 33.1* 32.7* 34.5*  MCV 100.0 100.3* 98.2 101.5*  PLT 148* 136* 146* 144*    Microbiology: Results for orders placed or performed during the hospital encounter of 05/08/23  MRSA Next Gen by PCR, Nasal     Status: None   Collection Time: 05/08/23  3:24 PM   Specimen: Nasal Mucosa; Nasal Swab  Result Value Ref Range Status   MRSA by PCR Next Gen NOT DETECTED NOT DETECTED Final    Comment: (NOTE) The GeneXpert MRSA Assay (FDA approved for NASAL specimens only), is one component of a  comprehensive MRSA colonization surveillance program. It is not intended to diagnose MRSA infection nor to guide or monitor treatment for MRSA infections. Test performance is not FDA approved in patients less than 25 years old. Performed at [facility], [address] [place], [place] [address]   Gastrointestinal Panel by PCR , Stool     Status: Abnormal   Collection Time: 05/08/23  4:23 PM   Specimen: Stool  Result Value Ref Range Status   Campylobacter species NOT DETECTED NOT DETECTED Final   Plesimonas shigelloides NOT DETECTED NOT DETECTED Final   Salmonella species NOT DETECTED NOT DETECTED Final   Yersinia enterocolitica NOT DETECTED NOT DETECTED Final   Vibrio species NOT DETECTED NOT DETECTED Final   Vibrio cholerae NOT DETECTED NOT DETECTED Final   Enteroaggregative E coli (EAEC) NOT DETECTED NOT DETECTED Final   Enteropathogenic E coli (EPEC) NOT DETECTED NOT DETECTED Final   Enterotoxigenic E coli (ETEC) NOT DETECTED NOT DETECTED Final   Shiga like toxin producing E coli (STEC) NOT DETECTED NOT DETECTED Final   Shigella/Enteroinvasive E coli (EIEC) NOT DETECTED NOT DETECTED Final   Cryptosporidium NOT DETECTED NOT DETECTED Final   Cyclospora cayetanensis NOT DETECTED NOT DETECTED Final   Entamoeba histolytica NOT DETECTED NOT DETECTED Final   Giardia lamblia NOT DETECTED NOT DETECTED Final   Adenovirus F40/41 NOT DETECTED NOT DETECTED Final   Astrovirus NOT DETECTED NOT DETECTED Final   Norovirus GI/GII DETECTED (A) NOT DETECTED Final    Comment: RESULT CALLED TO, READ BACK BY AND VERIFIED WITH: katie Lommis @1811  on 05/08/23 skl    Rotavirus A NOT DETECTED NOT DETECTED Final   Sapovirus (I, II, IV, and V) NOT DETECTED NOT DETECTED Final    Comment: Performed at [facility], [address]  [place], [place] [address]    Urinalysis: No results for input(s): "COLORURINE", "LABSPEC", "PHURINE", "GLUCOSEU", "HGBUR", "BILIRUBINUR", "KETONESUR",  "PROTEINUR", "UROBILINOGEN", "NITRITE", "LEUKOCYTESUR" in the last 72 hours.  Invalid input(s): "APPERANCEUR"   Imaging:  No results found.   Assessment & Plan:  67 y.o. male with a PMHx of hypertension, coronary artery disease, congestive heart failure, chronic alcohol abuse, cirrhosis with ascites admitted with the emergency room with weakness and diarrhea.  He was found to have a sodium of 114 on admission.  He received saline at admission which improved his sodium to 121.  Patient was placed on fluid restriction and diuretics.  He was also started on sodium chloride tablets.  Principal Problem:   Hyponatremia Active Problems:   Hepatic cirrhosis (HCC)   Atrial fibrillation, chronic (HCC)   History of alcohol abuse   Diarrhea   Weakness  #1: Hyponatremia: Hyponatremia most likely secondary to cirrhosis complicated by history of congestive heart failure.  Patient need to be on fluid restriction.  Will continue the furosemide along with spironolactone.  I agree with sodium chloride tablets.  Spoke to the patient in detail.  #2: Congestive heart failure: Continue fluid restriction and diuretics.  #3: Metabolic acidosis: Patient has been on sodium bicarbonate 650 mg twice a day.  Will reduce to 325 mg twice a day.  #4: Diarrhea: Diarrhea secondary to norovirus.  Continue supportive care.  Will follow closely.  LOS: 3 Lorain Childes, MD [facility]. 8/4/202412:52 PM

## 2023-05-11 NOTE — Progress Notes (Signed)
PHARMACY CONSULT NOTE  Pharmacy Consult for Electrolyte Monitoring and Replacement   Recent Labs: Potassium (mmol/L)  Date Value  05/11/2023 3.7   Magnesium (mg/dL)  Date Value  53/66/4403 2.0   Calcium (mg/dL)  Date Value  47/42/5956 9.1   Albumin (g/dL)  Date Value  38/75/6433 4.0  11/12/2022 4.1   Phosphorus (mg/dL)  Date Value  29/51/8841 3.0   Sodium (mmol/L)  Date Value  05/11/2023 126 (L)  11/12/2022 133 (L)    Assessment: 67 y.o. male past medical history significant for atrial fibrillation on Eliquis, BPH, prior CVA, skin cancer undergoing radiation, who presented w/ hyponatremia   Goal of Therapy:  Electrolytes WNL  Plan:  - no electrolytes replacement warrant at this time - will follow AM labs and replace as needed.  Avangelina Flight Rodriguez-Guzman PharmD, BCPS 05/11/2023 8:42 AM

## 2023-05-11 NOTE — Progress Notes (Signed)
  PROGRESS NOTE    Jeffrey Randolph  BJY:[id number] DOB: 06-13-1956 DOA: 05/08/2023 PCP: Lonie Peak, PA-C  [id number]  LOS: 3 days   Brief hospital course:   Assessment & Plan: 67 year old male with PMH of Chronic Alcohol Use, Liver Cirrhosis with Ascites (on Lasix at home), Chronic Diastolic Heart Failure, Paroxysmal Atrial Fibrillation who presented to the ED with generalized weakness and acute diarrhea found to have sodium level of 114 mmol/L, 80 ketones, and stool cultures positive for Norovirus.    Hyponatremia, acute on chronic --Na 114 on presentation.  most likely secondary to cirrhosis complicated by history of congestive heart failure.  --Na improved with IVF (NS), IV lasix 40 daily and albumin, spironolactone, salt tablets and Na bicarb, and fluid restriction. --neprho consulted Plan: --cont oral lasix 40 mg daily and spironolactone 25 mg daily --cont NaCl talbets --cont fluid restriction 1200 ml daily   Hypokalemia --monitor and replete PRN   Norovirus Gastroenteritis:  Diarrhea has resolved.     Chronic Diastolic Heart Failure: --cont lasix and spironolactone --cont fluid restriction   Paroxysmal Atrial Fibrillation s/p 2020 DCCV:  Previously on flecainide. - Continue Diltiazem 240 mg daily for rate control. - Continue Eliquis for anticoagulation.   Essential Hypertension:  - Continue Diltiazem 240 mg daily for rate control. --cont lasix and spironolactone for fluid removal   Liver Cirrhosis with Ascites: - Continue Lasix and Aldactone.   Chronic Alcohol Use: - Counseled on cessation.   GERD:  - PPI.   Chronic Lower Back Pain: - Continue home pain medications Lyrica 200 mg TID.   Hold Duloxetine until sodium levels are more stable.   Cholelithiasis: - negative murphy's sign.   Lumbar Degenerative Disc Disease s/p 2016 right L2-L3 microdiscectomy Documented 2020 CVA 2012 Acute Right Hemisphere Subinsular Hemorrhage   DVT prophylaxis:  YQ:MVHQION Code Status: Full code  Family Communication:  Level of care: Stepdown Dispo:   The patient is from: home Anticipated d/c is to: home Anticipated d/c date is: 1-2 days   Subjective and Interval History:  Pt had no complaints.  No diarrhea, no dyspnea.   Objective: Vitals:   05/11/23 1300 05/11/23 1400 05/11/23 1500 05/11/23 1600  BP: 115/60 120/62 (!) 115/50 (!) 121/56  Pulse: (!) 52 64 64 (!) 58  Resp: 12 15 10 10   Temp:      TempSrc:      SpO2: 97% 97% 100% 95%  Weight:      Height:        Intake/Output Summary (Last 24 hours) at 05/11/2023 1634 Last data filed at 05/11/2023 1139 Gross per 24 hour  Intake 280.32 ml  Output 1025 ml  Net -744.68 ml   Filed Weights   05/08/23 1102 05/08/23 1505  Weight: 83.5 kg 81 kg    Examination:   Constitutional: NAD, AAOx3 HEENT: conjunctivae and lids normal, EOMI CV: No cyanosis.   RESP: normal respiratory effort, on RA Neuro: II - XII grossly intact.   Psych: Normal mood and affect.  Appropriate judgement and reason   Data Reviewed: I have personally reviewed labs and imaging studies  Time spent: 50 minutes  Darlin Priestly, MD [facility] If 7PM-7AM, please contact night-coverage 05/11/2023, 4:34 PM

## 2023-05-11 NOTE — Progress Notes (Addendum)
Physical Therapy Treatment Patient Details Name: Jeffrey Randolph MRN: [id number] DOB: 1956/01/19 Today's Date: 05/11/2023   History of Present Illness 67 y/o male presented to ED on 05/08/23 for generalized weakness, dizziness, and legs "giving out". Admitted for acute on chronic hyponatremia. PMH: Afib, hepatic cirrhosis, HTN, CVA    PT Comments  Excited to walk.  OOB with supervision.  Hesitant to stand due to general stiffness.  He is given walker for support which is necessary for gait today.  He is able to complete full lap on ICU unit with min a x 1.  Mostly for general safety and cues to stand up fully into walker.  Remains in recliner after session.  Pt does seem to have some increased difficulty with mobility today.  He required a RW for gait which was not needed at eval.  Would recommend a RW for discharge vs cane at this time as previously recommended.  Pt would benefit from +1 assist at home upon discharge.  If mobility decreases further SNF would be beneficial for pt to return to PLOF.  Changed to HHPT recommendation if he is able to return home.  Will re-assess disposition next session.   If plan is discharge home, recommend the following: A little help with walking and/or transfers;A little help with bathing/dressing/bathroom;Help with stairs or ramp for entrance;Assistance with cooking/housework   Can travel by private Scientist, research (medical) walker (2 wheels)    Recommendations for Other Services       Precautions / Restrictions Precautions Precautions: Fall Restrictions Weight Bearing Restrictions: No     Mobility  Bed Mobility Overal bed mobility: Modified Independent               Patient Response: Cooperative  Transfers Overall transfer level: Needs assistance Equipment used: Rolling walker (2 wheels) Transfers: Sit to/from Stand Sit to Stand: Min guard                Ambulation/Gait Ambulation/Gait assistance: Min guard,  Min assist Gait Distance (Feet): 200 Feet Assistive device: Rolling walker (2 wheels) Gait Pattern/deviations: Step-through pattern, Decreased stride length, Trunk flexed Gait velocity: decreased     General Gait Details: heavy lean on RW.  unable to walk wihout today   Stairs             Wheelchair Mobility     Tilt Bed Tilt Bed Patient Response: Cooperative  Modified Rankin (Stroke Patients Only)       Balance Overall balance assessment: Needs assistance, History of Falls Sitting-balance support: No upper extremity supported, Feet supported Sitting balance-Leahy Scale: Normal     Standing balance support: Bilateral upper extremity supported Standing balance-Leahy Scale: Poor Standing balance comment: +1 assist for safety and guidance                            Cognition Arousal/Alertness: Awake/alert Behavior During Therapy: WFL for tasks assessed/performed Overall Cognitive Status: Within Functional Limits for tasks assessed                                          Exercises      General Comments        Pertinent Vitals/Pain Pain Assessment Pain Assessment: No/denies pain    Home Living  Prior Function            PT Goals (current goals can now be found in the care plan section) Progress towards PT goals: Progressing toward goals    Frequency    Min 1X/week      PT Plan Discharge plan needs to be updated    Co-evaluation              AM-PAC PT "6 Clicks" Mobility   Outcome Measure  Help needed turning from your back to your side while in a flat bed without using bedrails?: None Help needed moving from lying on your back to sitting on the side of a flat bed without using bedrails?: None Help needed moving to and from a bed to a chair (including a wheelchair)?: A Little Help needed standing up from a chair using your arms (e.g., wheelchair or bedside chair)?: A  Little Help needed to walk in hospital room?: A Little Help needed climbing 3-5 steps with a railing? : A Little 6 Click Score: 20    End of Session Equipment Utilized During Treatment: Gait belt Activity Tolerance: Patient tolerated treatment well Patient left: in chair;with call bell/phone within reach Nurse Communication: Mobility status PT Visit Diagnosis: Unsteadiness on feet (R26.81);Muscle weakness (generalized) (M62.81);History of falling (Z91.81);Repeated falls (R29.6)     Time: 1610-9604 PT Time Calculation (min) (ACUTE ONLY): 15 min  Charges:    $Gait Training: 8-22 mins PT General Charges $$ ACUTE PT VISIT: 1 Visit                   Danielle Dess, PTA 05/11/23, 2:52 PM

## 2023-05-12 DIAGNOSIS — E871 Hypo-osmolality and hyponatremia: Secondary | ICD-10-CM | POA: Diagnosis not present

## 2023-05-12 MED ORDER — SPIRONOLACTONE 25 MG PO TABS: 25.0000 mg | ORAL_TABLET | Freq: Every day | ORAL | 2 refills | Status: DC

## 2023-05-12 MED ORDER — SODIUM BICARBONATE 325 MG PO TABS: 325.0000 mg | ORAL_TABLET | Freq: Two times a day (BID) | ORAL | 2 refills | Status: DC

## 2023-05-12 MED ORDER — SODIUM CHLORIDE 1 G PO TABS: 1.0000 g | ORAL_TABLET | Freq: Three times a day (TID) | ORAL | 2 refills | Status: DC

## 2023-05-12 MED ORDER — FUROSEMIDE 40 MG PO TABS: 40.0000 mg | ORAL_TABLET | Freq: Every day | ORAL | 2 refills | Status: DC

## 2023-05-12 NOTE — Progress Notes (Addendum)
This entire session was performed under the direction of a licensed therapist. I have personally read, edited and approve of the note as written.    Jeffrey Randolph, PT, MPT  Physical Therapy Treatment Patient Details Name: Kashis Vana MRN: [id number] DOB: 01-17-1956 Today's Date: 05/12/2023   History of Present Illness 67 y/o male presented to ED on 05/08/23 for generalized weakness, dizziness, and legs "giving out". Admitted for acute on chronic hyponatremia. PMH: Afib, hepatic cirrhosis, HTN, CVA    PT Comments  Pt presents sitting in recliner, no complaints of pain. Pt able to perform sit<>stand and ambulate ~388ft with RW and CGA/supervision for safety. PT noted improved upright posturing during ambulation since previous visit. Pt was motivated throughout session and demonstrated improved activity tolerance. Pt would benefit from continued skilled therapy to maximize functional independence.    If plan is discharge home, recommend the following: A little help with walking and/or transfers;A little help with bathing/dressing/bathroom;Help with stairs or ramp for entrance;Assistance with cooking/housework   Can travel by private Scientist, research (medical) walker (2 wheels)    Recommendations for Other Services       Precautions / Restrictions Precautions Precautions: Fall Restrictions Weight Bearing Restrictions: No     Mobility  Bed Mobility               General bed mobility comments: Pt sitting in recliner upon entry    Transfers Overall transfer level: Needs assistance Equipment used: Rolling walker (2 wheels) Transfers: Sit to/from Stand Sit to Stand: Supervision, Min guard                Ambulation/Gait Ambulation/Gait assistance: Min guard, Supervision Gait Distance (Feet): 300 Feet Assistive device: Rolling walker (2 wheels) Gait Pattern/deviations: Step-through pattern Gait velocity: decreased     General Gait Details:  improved upright posturing with intermittent cues to stand closer to rolling walker for support.    Stairs             Wheelchair Mobility     Tilt Bed    Modified Rankin (Stroke Patients Only)       Balance Overall balance assessment: Needs assistance Sitting-balance support: No upper extremity supported, Feet supported Sitting balance-Leahy Scale: Normal     Standing balance support: Bilateral upper extremity supported Standing balance-Leahy Scale: Fair                              Cognition Arousal/Alertness: Awake/alert Behavior During Therapy: WFL for tasks assessed/performed Overall Cognitive Status: Within Functional Limits for tasks assessed                                          Exercises      General Comments        Pertinent Vitals/Pain Pain Assessment Pain Assessment: No/denies pain    Home Living                          Prior Function            PT Goals (current goals can now be found in the care plan section) Acute Rehab PT Goals Patient Stated Goal: to go home PT Goal Formulation: With patient Time For Goal Achievement: 05/23/23 Potential to Achieve Goals: Good Progress towards  PT goals: Progressing toward goals    Frequency    Min 1X/week      PT Plan Current plan remains appropriate    Co-evaluation              AM-PAC PT "6 Clicks" Mobility   Outcome Measure  Help needed turning from your back to your side while in a flat bed without using bedrails?: None Help needed moving from lying on your back to sitting on the side of a flat bed without using bedrails?: None Help needed moving to and from a bed to a chair (including a wheelchair)?: A Little Help needed standing up from a chair using your arms (e.g., wheelchair or bedside chair)?: A Little Help needed to walk in hospital room?: A Little Help needed climbing 3-5 steps with a railing? : A Little 6 Click Score:  20    End of Session Equipment Utilized During Treatment: Gait belt Activity Tolerance: Patient tolerated treatment well Patient left: in chair;with call bell/phone within reach   PT Visit Diagnosis: Unsteadiness on feet (R26.81);Muscle weakness (generalized) (M62.81);History of falling (Z91.81);Repeated falls (R29.6)     Time: 6578-4696 PT Time Calculation (min) (ACUTE ONLY): 16 min  Charges:                           Blenda Nicely, PT, SPT 11:45 AM,05/12/23

## 2023-05-12 NOTE — Discharge Summary (Signed)
Physician Discharge Summary   Jeffrey Randolph  male DOB: 1956/06/25  ZOX:[id number]  PCP: Lonie Peak, PA-C  Admit date: 05/08/2023 Discharge date: 05/12/2023  Admitted From: home Disposition:  home CODE STATUS: Full code  Discharge Instructions     No wound care   Complete by: As directed       Hospital Course:  For full details, please see H&P, progress notes, consult notes and ancillary notes.  Briefly,  Jeffrey Randolph is a 67 year old male with PMH of Chronic Alcohol Use, Liver Cirrhosis with Ascites, Chronic Diastolic Heart Failure, Paroxysmal Atrial Fibrillation who presented to the ED with generalized weakness and acute diarrhea found to have sodium level of 114 mmol/L, and stool studies positive for Norovirus.    Hyponatremia, acute on chronic --Na 114 on presentation.  most likely secondary to cirrhosis complicated by history of congestive heart failure.  --Na improved with IVF (NS), IV lasix 40 daily and albumin, spironolactone, salt tablets and Na bicarb, and fluid restriction of 1200 ml daily. --nephro consulted and cleared for discharge.  Na stable around 125 for 3 days prior to discharge. --discharged on oral lasix 40 mg daily and spironolactone 25 mg daily, NaCl tablets and Na bicarb tablets. --f/u with nephro Dr. Wynelle Link 1 week after discharge.   Hypokalemia --monitored and repleted PRN   Norovirus Gastroenteritis:  Diarrhea has resolved.     Chronic Diastolic Heart Failure: --cont lasix and spironolactone   Paroxysmal Atrial Fibrillation s/p 2020 DCCV:  Previously on flecainide. - Continue Diltiazem 240 mg daily for rate control. - Continue Eliquis for anticoagulation.   Essential Hypertension:  - Continue Diltiazem 240 mg daily for rate control. --cont lasix and spironolactone for fluid removal   Liver Cirrhosis with Ascites: - Continue Lasix and Aldactone.   Chronic Alcohol Use: - Counseled on cessation.   GERD:  - PPI.   Chronic Lower Back  Pain: - Continue home pain medications Lyrica 200 mg TID.     Cholelithiasis: - negative murphy's sign.   Lumbar Degenerative Disc Disease s/p 2016 right L2-L3 microdiscectomy Documented 2020 CVA 2012 Acute Right Hemisphere Subinsular Hemorrhage   Discharge Diagnoses:  Principal Problem:   Hyponatremia Active Problems:   Hepatic cirrhosis (HCC)   Atrial fibrillation, chronic (HCC)   History of alcohol abuse   Diarrhea   Weakness   30 Day Unplanned Readmission Risk Score    Flowsheet Row ED to Hosp-Admission (Current) from 05/08/2023 in [facility] ICU/CCU  30 Day Unplanned Readmission Risk Score (%) 12.88 Filed at 05/12/2023 1200       This score is the patient's risk of an unplanned readmission within 30 days of being discharged (0 -100%). The score is based on dignosis, age, lab data, medications, orders, and past utilization.   Low:  0-14.9   Medium: 15-21.9   High: 22-29.9   Extreme: 30 and above         Discharge Instructions:  Allergies as of 05/12/2023   No Known Allergies      Medication List     TAKE these medications    apixaban 5 MG Tabs tablet Commonly known as: ELIQUIS Take 1 tablet (5 mg total) by mouth 2 (two) times daily for 30 days.   diltiazem 240 MG 24 hr capsule Commonly known as: CARDIZEM CD Take 1 capsule (240 mg total) by mouth daily.   DULoxetine 30 MG capsule Commonly known as: CYMBALTA Take 30 mg by mouth daily.   furosemide 40 MG  tablet Commonly known as: LASIX Take 1 tablet (40 mg total) by mouth daily. Start taking on: May 13, 2023   omeprazole 40 MG capsule Commonly known as: PRILOSEC Take 40 mg by mouth daily.   pregabalin 200 MG capsule Commonly known as: LYRICA Take 200 mg by mouth 3 (three) times daily.   rOPINIRole 0.5 MG tablet Commonly known as: REQUIP Take 0.5 mg by mouth at bedtime.   sodium bicarbonate 325 MG tablet Take 1 tablet (325 mg total) by mouth 2 (two) times daily.    sodium chloride 1 g tablet Take 1 tablet (1 g total) by mouth 3 (three) times daily with meals.   spironolactone 25 MG tablet Commonly known as: ALDACTONE Take 1 tablet (25 mg total) by mouth daily. Start taking on: May 13, 2023         Follow-up Information     Kolluru, Threasa Heads, MD Follow up in 1 week(s).   Specialty: Nephrology Why: for low sodium level Contact information: [address] [place] [address] [phone]                 No Known Allergies   The results of significant diagnostics from this hospitalization (including imaging, microbiology, ancillary and laboratory) are listed below for reference.   Consultations:   Procedures/Studies: US Abdomen Limited RUQ (LIVER/GB)  Result Date: 05/09/2023 CLINICAL DATA:  Abdominal pain. EXAM: ULTRASOUND ABDOMEN LIMITED RIGHT UPPER QUADRANT COMPARISON:  CT abdomen pelvis dated 03/20/2022. FINDINGS: Gallbladder: There is sludge and stones in the gallbladder. No gallbladder wall thickening or pericholecystic fluid. Negative sonographic Murphy's sign. Common bile duct: Diameter: 6 mm Liver: There is diffuse increased liver echogenicity with irregular contour in keeping with cirrhosis. Portal vein is patent on color Doppler imaging with normal direction of blood flow towards the liver. Other: Small ascites and partially visualized right pleural effusion. IMPRESSION: 1. Cholelithiasis without sonographic evidence of acute cholecystitis. 2. Cirrhosis. 3. Small ascites and partially visualized right pleural effusion. 4. Patent main portal vein with hepatopetal flow. Electronically Signed   By: Elgie Collard M.D.   On: 05/09/2023 04:03   CT HEAD WO CONTRAST ( )  Result Date: 05/08/2023 CLINICAL DATA:  Head trauma, minor (Age >= 65y) recurrent falls EXAM: CT HEAD WITHOUT CONTRAST TECHNIQUE: Contiguous axial images were obtained from the base of the skull through the vertex without intravenous  contrast. RADIATION DOSE REDUCTION: This exam was performed according to the departmental dose-optimization program which includes automated exposure control, adjustment of the mA and/or kV according to patient size and/or use of iterative reconstruction technique. COMPARISON:  CT Head 03/20/22 FINDINGS: Brain: No evidence of acute infarction, hemorrhage, hydrocephalus, extra-axial collection or mass lesion/mass effect. Sequela of mild-to-moderate chronic microvascular ischemic change. Vascular: No hyperdense vessel or unexpected calcification. Skull: Normal. Negative for fracture or focal lesion. Sinuses/Orbits: No middle ear or mastoid effusion. Paranasal sinuses are clear. Orbits are unremarkable. Other: None. IMPRESSION: No acute intracranial abnormality. Electronically Signed   By: Lorenza Cambridge M.D.   On: 05/08/2023 15:05      Labs: BNP (last 3 results) No results for input(s): "BNP" in the last 8760 hours. Basic Metabolic Panel: Recent Labs  Lab 05/09/23 0509 05/09/23 1044 05/10/23 0510 05/10/23 0511 05/10/23 0927 05/10/23 1753 05/10/23 2128 05/11/23 0125 05/11/23 0534 05/12/23 0347  NA 124*   < > 125* 125*   < > 123* 125* 125* 126* 125*  K 3.3*  --  2.8* 2.8*   < > 3.7 4.1  3.8 3.7 3.5  CL 91*  --  87* 87*  --   --   --   --  91* 92*  CO2 21*  --  28 30  --   --   --   --  25 24  GLUCOSE 84  --  114* 114*  --   --   --   --  119* 116*  BUN <5*  --  <5* <5*  --   --   --   --  5* 7*  CREATININE 0.46*  --  0.46* 0.46*  --   --   --   --  0.44* 0.47*  CALCIUM 7.9*  --  8.7* 8.9  --   --   --   --  9.1 9.4  MG  --   --   --  1.8  --   --   --   --  2.0 2.0  PHOS  --   --  2.6  --   --   --   --   --  3.0  --    < > = values in this interval not displayed.   Liver Function Tests: Recent Labs  Lab 05/08/23 1710 05/09/23 0509 05/10/23 0510  AST 28 28  --   ALT 16 16  --   ALKPHOS 106 83  --   BILITOT 2.9* 2.7*  --   PROT 7.0 6.3*  --   ALBUMIN 3.5 3.3* 4.0   No results for  input(s): "LIPASE", "AMYLASE" in the last 168 hours. No results for input(s): "AMMONIA" in the last 168 hours. CBC: Recent Labs  Lab 05/08/23 1107 05/09/23 0509 05/10/23 0510 05/11/23 0534 05/12/23 0347  WBC 6.9 3.8* 4.0 4.0 4.7  HGB 11.5* 11.9* 11.8* 11.9* 12.3*  HCT 32.2* 33.1* 32.7* 34.5* 33.9*  MCV 100.0 100.3* 98.2 101.5* 100.9*  PLT 148* 136* 146* 144* 155   Cardiac Enzymes: No results for input(s): "CKTOTAL", "CKMB", "CKMBINDEX", "TROPONINI" in the last 168 hours. BNP: Invalid input(s): "POCBNP" CBG: Recent Labs  Lab 05/08/23 1508  GLUCAP 93   D-Dimer No results for input(s): "DDIMER" in the last 72 hours. Hgb A1c No results for input(s): "HGBA1C" in the last 72 hours. Lipid Profile No results for input(s): "CHOL", "HDL", "LDLCALC", "TRIG", "CHOLHDL", "LDLDIRECT" in the last 72 hours. Thyroid function studies No results for input(s): "TSH", "T4TOTAL", "T3FREE", "THYROIDAB" in the last 72 hours.  Invalid input(s): "FREET3" Anemia work up No results for input(s): "VITAMINB12", "FOLATE", "FERRITIN", "TIBC", "IRON", "RETICCTPCT" in the last 72 hours. Urinalysis    Component Value Date/Time   COLORURINE YELLOW (A) 05/08/2023 1157   APPEARANCEUR CLEAR (A) 05/08/2023 1157   LABSPEC 1.018 05/08/2023 1157   PHURINE 6.0 05/08/2023 1157   GLUCOSEU NEGATIVE 05/08/2023 1157   HGBUR MODERATE (A) 05/08/2023 1157   BILIRUBINUR NEGATIVE 05/08/2023 1157   KETONESUR 80 (A) 05/08/2023 1157   PROTEINUR 30 (A) 05/08/2023 1157   UROBILINOGEN 0.2 08/03/2011 1937   NITRITE NEGATIVE 05/08/2023 1157   LEUKOCYTESUR NEGATIVE 05/08/2023 1157   Sepsis Labs Recent Labs  Lab 05/09/23 0509 05/10/23 0510 05/11/23 0534 05/12/23 0347  WBC 3.8* 4.0 4.0 4.7   Microbiology Recent Results (from the past 240 hour(s))  MRSA Next Gen by PCR, Nasal     Status: None   Collection Time: 05/08/23  3:24 PM   Specimen: Nasal Mucosa; Nasal Swab  Result Value Ref Range Status   MRSA by PCR  Next Gen NOT DETECTED  NOT DETECTED Final    Comment: (NOTE) The GeneXpert MRSA Assay (FDA approved for NASAL specimens only), is one component of a comprehensive MRSA colonization surveillance program. It is not intended to diagnose MRSA infection nor to guide or monitor treatment for MRSA infections. Test performance is not FDA approved in patients less than 85 years old. Performed at [facility], [address] [place], [place] [address]   Gastrointestinal Panel by PCR , Stool     Status: Abnormal   Collection Time: 05/08/23  4:23 PM   Specimen: Stool  Result Value Ref Range Status   Campylobacter species NOT DETECTED NOT DETECTED Final   Plesimonas shigelloides NOT DETECTED NOT DETECTED Final   Salmonella species NOT DETECTED NOT DETECTED Final   Yersinia enterocolitica NOT DETECTED NOT DETECTED Final   Vibrio species NOT DETECTED NOT DETECTED Final   Vibrio cholerae NOT DETECTED NOT DETECTED Final   Enteroaggregative E coli (EAEC) NOT DETECTED NOT DETECTED Final   Enteropathogenic E coli (EPEC) NOT DETECTED NOT DETECTED Final   Enterotoxigenic E coli (ETEC) NOT DETECTED NOT DETECTED Final   Shiga like toxin producing E coli (STEC) NOT DETECTED NOT DETECTED Final   Shigella/Enteroinvasive E coli (EIEC) NOT DETECTED NOT DETECTED Final   Cryptosporidium NOT DETECTED NOT DETECTED Final   Cyclospora cayetanensis NOT DETECTED NOT DETECTED Final   Entamoeba histolytica NOT DETECTED NOT DETECTED Final   Giardia lamblia NOT DETECTED NOT DETECTED Final   Adenovirus F40/41 NOT DETECTED NOT DETECTED Final   Astrovirus NOT DETECTED NOT DETECTED Final   Norovirus GI/GII DETECTED (A) NOT DETECTED Final    Comment: RESULT CALLED TO, READ BACK BY AND VERIFIED WITH: katie Lommis @[phone]  on 05/08/23 skl    Rotavirus A NOT DETECTED NOT DETECTED Final   Sapovirus (I, II, IV, and V) NOT DETECTED NOT DETECTED Final    Comment: Performed at [facility], [address]  [place], [place] [address]     Total time spend on discharging this patient, including the last patient exam, discussing the hospital stay, instructions for ongoing care as it relates to all pertinent caregivers, as well as preparing the medical discharge records, prescriptions, and/or referrals as applicable, is 40 minutes.    Darlin Priestly, MD  [facility] 05/12/2023, 2:38 PM

## 2023-05-12 NOTE — Progress Notes (Signed)
[facility]  ROUNDING NOTE   Subjective:   Reports no more diarrhea. States he is eating better.   Na 125.   Objective:  Vital signs in last 24 hours:  Temp:  [97.6 F (36.4 C)-97.9 F (36.6 C)] 97.6 F (36.4 C) (08/05 0800) Pulse Rate:  [51-69] 68 (08/05 1000) Resp:  [10-17] 12 (08/05 1000) BP: (100-129)/(45-71) 126/62 (08/05 1000) SpO2:  [94 %-100 %] 96 % (08/05 1000)  Weight change:  Filed Weights   05/08/23 1102 05/08/23 1505  Weight: 83.5 kg 81 kg    Intake/Output: I/O last 3 completed shifts: In: 1040.3 [P.O.:960; IV Piggyback:80.3] Out: 2200 [Urine:2200]   Intake/Output this shift:  No intake/output data recorded.  Physical Exam: General: NAD, laying in bed  Head: Normocephalic, atraumatic. Moist oral mucosal membranes  Eyes: Anicteric, PERRL  Neck: Supple, trachea midline  Lungs:  Clear to auscultation  Heart: Regular rate and rhythm  Abdomen:  Soft, nontender,   Extremities:  no peripheral edema.  Neurologic: Nonfocal, moving all four extremities  Skin: No lesions       Basic Metabolic Panel: Recent Labs  Lab 05/09/23 0509 05/09/23 1044 05/10/23 0510 05/10/23 0511 05/10/23 0927 05/10/23 1753 05/10/23 2128 05/11/23 0125 05/11/23 0534 05/12/23 0347  NA 124*   < > 125* 125*   < > 123* 125* 125* 126* 125*  K 3.3*  --  2.8* 2.8*   < > 3.7 4.1 3.8 3.7 3.5  CL 91*  --  87* 87*  --   --   --   --  91* 92*  CO2 21*  --  28 30  --   --   --   --  25 24  GLUCOSE 84  --  114* 114*  --   --   --   --  119* 116*  BUN <5*  --  <5* <5*  --   --   --   --  5* 7*  CREATININE 0.46*  --  0.46* 0.46*  --   --   --   --  0.44* 0.47*  CALCIUM 7.9*  --  8.7* 8.9  --   --   --   --  9.1 9.4  MG  --   --   --  1.8  --   --   --   --  2.0 2.0  PHOS  --   --  2.6  --   --   --   --   --  3.0  --    < > = values in this interval not displayed.    Liver Function Tests: Recent Labs  Lab 05/08/23 1710 05/09/23 0509 05/10/23 0510  AST 28 28  --    ALT 16 16  --   ALKPHOS 106 83  --   BILITOT 2.9* 2.7*  --   PROT 7.0 6.3*  --   ALBUMIN 3.5 3.3* 4.0   No results for input(s): "LIPASE", "AMYLASE" in the last 168 hours. No results for input(s): "AMMONIA" in the last 168 hours.  CBC: Recent Labs  Lab 05/08/23 1107 05/09/23 0509 05/10/23 0510 05/11/23 0534 05/12/23 0347  WBC 6.9 3.8* 4.0 4.0 4.7  HGB 11.5* 11.9* 11.8* 11.9* 12.3*  HCT 32.2* 33.1* 32.7* 34.5* 33.9*  MCV 100.0 100.3* 98.2 101.5* 100.9*  PLT 148* 136* 146* 144* 155    Cardiac Enzymes: No results for input(s): "CKTOTAL", "CKMB", "CKMBINDEX", "TROPONINI" in the last 168 hours.  BNP: Invalid input(s): "  POCBNP"  CBG: Recent Labs  Lab 05/08/23 1508  GLUCAP 93    Microbiology: Results for orders placed or performed during the hospital encounter of 05/08/23  MRSA Next Gen by PCR, Nasal     Status: None   Collection Time: 05/08/23  3:24 PM   Specimen: Nasal Mucosa; Nasal Swab  Result Value Ref Range Status   MRSA by PCR Next Gen NOT DETECTED NOT DETECTED Final    Comment: (NOTE) The GeneXpert MRSA Assay (FDA approved for NASAL specimens only), is one component of a comprehensive MRSA colonization surveillance program. It is not intended to diagnose MRSA infection nor to guide or monitor treatment for MRSA infections. Test performance is not FDA approved in patients less than 58 years old. Performed at [facility], [address] [place], [place] [address]   Gastrointestinal Panel by PCR , Stool     Status: Abnormal   Collection Time: 05/08/23  4:23 PM   Specimen: Stool  Result Value Ref Range Status   Campylobacter species NOT DETECTED NOT DETECTED Final   Plesimonas shigelloides NOT DETECTED NOT DETECTED Final   Salmonella species NOT DETECTED NOT DETECTED Final   Yersinia enterocolitica NOT DETECTED NOT DETECTED Final   Vibrio species NOT DETECTED NOT DETECTED Final   Vibrio cholerae NOT DETECTED NOT DETECTED Final    Enteroaggregative E coli (EAEC) NOT DETECTED NOT DETECTED Final   Enteropathogenic E coli (EPEC) NOT DETECTED NOT DETECTED Final   Enterotoxigenic E coli (ETEC) NOT DETECTED NOT DETECTED Final   Shiga like toxin producing E coli (STEC) NOT DETECTED NOT DETECTED Final   Shigella/Enteroinvasive E coli (EIEC) NOT DETECTED NOT DETECTED Final   Cryptosporidium NOT DETECTED NOT DETECTED Final   Cyclospora cayetanensis NOT DETECTED NOT DETECTED Final   Entamoeba histolytica NOT DETECTED NOT DETECTED Final   Giardia lamblia NOT DETECTED NOT DETECTED Final   Adenovirus F40/41 NOT DETECTED NOT DETECTED Final   Astrovirus NOT DETECTED NOT DETECTED Final   Norovirus GI/GII DETECTED (A) NOT DETECTED Final    Comment: RESULT CALLED TO, READ BACK BY AND VERIFIED WITH: katie Lommis @[phone]  on 05/08/23 skl    Rotavirus A NOT DETECTED NOT DETECTED Final   Sapovirus (I, II, IV, and V) NOT DETECTED NOT DETECTED Final    Comment: Performed at [facility], [address] [place], [place] [address]    Coagulation Studies: No results for input(s): "LABPROT", "INR" in the last 72 hours.  Urinalysis: No results for input(s): "COLORURINE", "LABSPEC", "PHURINE", "GLUCOSEU", "HGBUR", "BILIRUBINUR", "KETONESUR", "PROTEINUR", "UROBILINOGEN", "NITRITE", "LEUKOCYTESUR" in the last 72 hours.  Invalid input(s): "APPERANCEUR"    Imaging: No results found.   Medications:    sodium chloride Stopped (05/10/23 1446)    apixaban  5 mg Oral BID   Chlorhexidine Gluconate Cloth  6 each Topical Daily   diltiazem  240 mg Oral Daily   furosemide  40 mg Oral Daily   pantoprazole  40 mg Oral Daily   pregabalin  200 mg Oral TID   rOPINIRole  0.5 mg Oral QHS   sodium bicarbonate  325 mg Oral BID   sodium chloride  1 g Oral TID WC   spironolactone  25 mg Oral Daily   sodium chloride, ondansetron **OR** ondansetron (ZOFRAN) IV, mouth rinse  Assessment/ Plan:  Mr. Jeffrey Randolph is a 67 y.o.  male with  hypertension, coronary artery disease status post CABG, chronic alcohol, hepatic cirrhosis, ascites who is admitted to [facility] on 05/08/2023 for Hyponatremia [E87.1] Weakness [  R53.1]   #1: Hyponatremia: Chronic hyponatremia most likely secondary to cirrhosis complicated by history of congestive heart failure.   - Continue fluid restriction - Continue furosemide and spironolactone.   - salt tabs   #2: Congestive heart failure:  - Continue fluid restriction and diuretics.   #3: Metabolic acidosis:  - Oral sodium bicarbonate.    #4: Diarrhea: Diarrhea secondary to norovirus.   - Continue supportive care.   LOS: 4 Jeffrey Randolph 8/5/20241:14 PM

## 2023-05-12 NOTE — TOC Transition Note (Signed)
Transition of Care [facility]) - CM/SW Discharge Note   Patient Details  Name: Jeffrey Randolph MRN: [id number] Date of Birth: [birth year]  Transition of Care [facility]) CM/SW Contact:  Kreg Shropshire, RN Phone Number: 05/12/2023, 4:16 PM   Clinical Narrative:     D/c orders in. Pt has HH set up with [facility]. Cm gave taxi voucher and had pt sign waiver for taxi. Cm gave voucher to nurse.  Final next level of care: Home w Home Health Services Barriers to Discharge: Continued Medical Work up   Patient Goals and CMS Choice CMS Medicare.gov Compare Post Acute Care list provided to:: Patient Choice offered to / list presented to : Patient  Discharge Placement                  Patient to be transferred to facility by: Taxi      Discharge Plan and Services Additional resources added to the After Visit Summary for                            [facility] Arranged: PT, OT [facility] Agency: [facility] Date [facility] Agency Contacted: 05/09/23   Representative spoke with at [facility] Agency: Barbara Cower  Social Determinants of Health (SDOH) Interventions SDOH Screenings   Food Insecurity: No Food Insecurity (05/08/2023)  Housing: Low Risk  (05/08/2023)  Transportation Needs: No Transportation Needs (05/08/2023)  Utilities: Not At Risk (05/08/2023)  Tobacco Use: Medium Risk (05/08/2023)     Readmission Risk Interventions    05/09/2023    2:37 PM  Readmission Risk Prevention Plan  Post Dischage Appt Complete  Medication Screening Complete  Transportation Screening Complete

## 2023-05-12 NOTE — Discharge Instructions (Signed)
Limit your fluid intake to 1200 mL a day. This includes all oral liquid intake.

## 2023-05-12 NOTE — Progress Notes (Signed)
Discharge instructions given to and reviewed with patient. Patient verbalized understanding of all discharge instructions including fluid restriction, new prescriptions for medications and follow up appointment. Education provided about hyponatremia and patient verbalized understanding. [employer] taxi called and there is an ETA of 1 hour before they can come get patient.

## 2023-05-12 NOTE — Progress Notes (Signed)
Per Cleotis Nipper, NP continue 1200 mL fluid restriction at discharge.

## 2023-05-12 NOTE — Progress Notes (Signed)
Occupational Therapy Treatment Patient Details Name: Jeffrey Randolph MRN: [id number] DOB: 06/06/1956 Today's Date: 05/12/2023   History of present illness 67 y/o male presented to ED on 05/08/23 for generalized weakness, dizziness, and legs "giving out". Admitted for acute on chronic hyponatremia. PMH: Afib, hepatic cirrhosis, HTN, CVA   OT comments  Pt received semi-reclined in bed. Appearing alert; willing to work with OT on grooming at the sink prior to breakfast (tray just arrived). T/f MOD (I) to EOB; mobilized with CGA without AD. See flowsheet below for further details of session. Left seated in chair with all needs in reach.  Patient will benefit from continued OT while in acute care.    Recommendations for follow up therapy are one component of a multi-disciplinary discharge planning process, led by the attending physician.  Recommendations may be updated based on patient status, additional functional criteria and insurance authorization.    Assistance Recommended at Discharge PRN  Patient can return home with the following  Help with stairs or ramp for entrance   Equipment Recommendations  None recommended by OT    Recommendations for Other Services      Precautions / Restrictions Precautions Precautions: Fall Restrictions Weight Bearing Restrictions: No       Mobility Bed Mobility Overal bed mobility: Modified Independent Bed Mobility: Supine to Sit     Supine to sit: Modified independent (Device/Increase time)          Transfers Overall transfer level: Needs assistance Equipment used: None Transfers: Sit to/from Stand Sit to Stand: Min guard                 Balance Overall balance assessment: Mild deficits observed, not formally tested                                         ADL either performed or assessed with clinical judgement   ADL Overall ADL's : Needs assistance/impaired     Grooming: Wash/dry  hands;Supervision/safety;Standing Grooming Details (indicate cue type and reason): Pt agreeable to wash hands at sink; pt reaching out for furniture for unilateral support while walking, no overt loss of balance; able to stand at sink and wash hands.                             Functional mobility during ADLs: Min guard (no AD used this session) General ADL Comments: Able to demonstrate ability to make figure four position while seated in recliner.    Extremity/Trunk Assessment Upper Extremity Assessment Upper Extremity Assessment: Overall WFL for tasks assessed   Lower Extremity Assessment Lower Extremity Assessment: Defer to PT evaluation        Vision       Perception     Praxis      Cognition Arousal/Alertness: Awake/alert Behavior During Therapy: WFL for tasks assessed/performed Overall Cognitive Status: Within Functional Limits for tasks assessed                                          Exercises      Shoulder Instructions       General Comments Pt on room air throughout session.    Pertinent Vitals/ Pain       Pain Assessment Pain Assessment: No/denies pain  Home Living                                          Prior Functioning/Environment              Frequency  Min 1X/week        Progress Toward Goals  OT Goals(current goals can now be found in the care plan section)  Progress towards OT goals: Progressing toward goals  Acute Rehab OT Goals Patient Stated Goal: Go home OT Goal Formulation: With patient Time For Goal Achievement: 05/23/23 Potential to Achieve Goals: Good ADL Goals Pt Will Perform Grooming: Independently;standing Pt Will Perform Lower Body Dressing: Independently;sit to/from stand Pt Will Transfer to Toilet: Independently;ambulating;regular height toilet  Plan Discharge plan remains appropriate    Co-evaluation                 AM-PAC OT "6 Clicks" Daily Activity      Outcome Measure   Help from another person eating meals?: None Help from another person taking care of personal grooming?: None Help from another person toileting, which includes using toliet, bedpan, or urinal?: A Little Help from another person bathing (including washing, rinsing, drying)?: A Little Help from another person to put on and taking off regular upper body clothing?: None Help from another person to put on and taking off regular lower body clothing?: None 6 Click Score: 22    End of Session    OT Visit Diagnosis: Unsteadiness on feet (R26.81)   Activity Tolerance Patient tolerated treatment well   Patient Left in chair;with call bell/phone within reach (RN aware that pt does not have chair alarm on)   Nurse Communication Mobility status        Time: 9147-8295 OT Time Calculation (min): 13 min  Charges: OT General Charges $OT Visit: 1 Visit OT Treatments $Self Care/Home Management : 8-22 mins  Linward Foster, MS, OTR/L  Alvester Morin 05/12/2023, 12:25 PM

## 2023-05-12 NOTE — Progress Notes (Signed)
PHARMACY CONSULT NOTE  Pharmacy Consult for Electrolyte Monitoring and Replacement   Recent Labs: Potassium (mmol/L)  Date Value  05/12/2023 3.5   Magnesium (mg/dL)  Date Value  32/44/0102 2.0   Calcium (mg/dL)  Date Value  72/53/6644 9.4   Albumin (g/dL)  Date Value  03/47/4259 4.0  11/12/2022 4.1   Phosphorus (mg/dL)  Date Value  56/38/7564 3.0   Sodium (mmol/L)  Date Value  05/12/2023 125 (L)  11/12/2022 133 (L)   Assessment: 67 y.o. male past medical history significant for atrial fibrillation on Eliquis, BPH, prior CVA, skin cancer undergoing radiation, who presented w/ hyponatremia  Diuresis: Lasix 40 mg PO daily + Aldactone 25 mg PO daily  Goal of Therapy:  Electrolytes within normal limits  Plan:  --Hyponatremia management per nephrology. Continue NaCl 1 g TIDM --Patient care transferred from PCCM to [facility]. Will discontinue electrolyte consult at this time. Defer further ordering of labs and electrolyte replacement to primary team  Tressie Ellis 05/12/2023 7:46 AM

## 2023-05-24 ENCOUNTER — Inpatient Hospital Stay (HOSPITAL_COMMUNITY)
Admission: EM | Admit: 2023-05-24 | Discharge: 2023-05-29 | DRG: 641 | Disposition: A | Discharge status: Home Health | Payer: 59 | Attending: Internal Medicine | Admitting: Internal Medicine

## 2023-05-24 ENCOUNTER — Encounter (HOSPITAL_COMMUNITY): Payer: Self-pay

## 2023-05-24 ENCOUNTER — Emergency Department (HOSPITAL_COMMUNITY): Payer: 59

## 2023-05-24 ENCOUNTER — Other Ambulatory Visit: Payer: Self-pay

## 2023-05-24 DIAGNOSIS — M48061 Spinal stenosis, lumbar region without neurogenic claudication: Secondary | ICD-10-CM | POA: Diagnosis present

## 2023-05-24 DIAGNOSIS — Z8673 Personal history of transient ischemic attack (TIA), and cerebral infarction without residual deficits: Secondary | ICD-10-CM | POA: Diagnosis not present

## 2023-05-24 DIAGNOSIS — I110 Hypertensive heart disease with heart failure: Secondary | ICD-10-CM | POA: Diagnosis present

## 2023-05-24 DIAGNOSIS — I10 Essential (primary) hypertension: Secondary | ICD-10-CM | POA: Diagnosis present

## 2023-05-24 DIAGNOSIS — K219 Gastro-esophageal reflux disease without esophagitis: Secondary | ICD-10-CM | POA: Diagnosis present

## 2023-05-24 DIAGNOSIS — Z800 Family history of malignant neoplasm of digestive organs: Secondary | ICD-10-CM | POA: Diagnosis not present

## 2023-05-24 DIAGNOSIS — M47817 Spondylosis without myelopathy or radiculopathy, lumbosacral region: Secondary | ICD-10-CM | POA: Diagnosis not present

## 2023-05-24 DIAGNOSIS — Y92009 Unspecified place in unspecified non-institutional (private) residence as the place of occurrence of the external cause: Secondary | ICD-10-CM

## 2023-05-24 DIAGNOSIS — S3992XA Unspecified injury of lower back, initial encounter: Secondary | ICD-10-CM | POA: Diagnosis not present

## 2023-05-24 DIAGNOSIS — E876 Hypokalemia: Secondary | ICD-10-CM | POA: Diagnosis present

## 2023-05-24 DIAGNOSIS — Z87891 Personal history of nicotine dependence: Secondary | ICD-10-CM

## 2023-05-24 DIAGNOSIS — S3991XA Unspecified injury of abdomen, initial encounter: Secondary | ICD-10-CM | POA: Diagnosis not present

## 2023-05-24 DIAGNOSIS — G8929 Other chronic pain: Secondary | ICD-10-CM | POA: Diagnosis present

## 2023-05-24 DIAGNOSIS — W19XXXA Unspecified fall, initial encounter: Secondary | ICD-10-CM | POA: Diagnosis not present

## 2023-05-24 DIAGNOSIS — I482 Chronic atrial fibrillation, unspecified: Secondary | ICD-10-CM | POA: Diagnosis present

## 2023-05-24 DIAGNOSIS — S199XXA Unspecified injury of neck, initial encounter: Secondary | ICD-10-CM | POA: Diagnosis not present

## 2023-05-24 DIAGNOSIS — N400 Enlarged prostate without lower urinary tract symptoms: Secondary | ICD-10-CM | POA: Diagnosis present

## 2023-05-24 DIAGNOSIS — I4891 Unspecified atrial fibrillation: Secondary | ICD-10-CM

## 2023-05-24 DIAGNOSIS — S3993XA Unspecified injury of pelvis, initial encounter: Secondary | ICD-10-CM | POA: Diagnosis not present

## 2023-05-24 DIAGNOSIS — S0990XA Unspecified injury of head, initial encounter: Secondary | ICD-10-CM | POA: Diagnosis not present

## 2023-05-24 DIAGNOSIS — R918 Other nonspecific abnormal finding of lung field: Secondary | ICD-10-CM | POA: Diagnosis not present

## 2023-05-24 DIAGNOSIS — Z7901 Long term (current) use of anticoagulants: Secondary | ICD-10-CM

## 2023-05-24 DIAGNOSIS — R296 Repeated falls: Secondary | ICD-10-CM | POA: Diagnosis present

## 2023-05-24 DIAGNOSIS — Z043 Encounter for examination and observation following other accident: Secondary | ICD-10-CM | POA: Diagnosis not present

## 2023-05-24 DIAGNOSIS — G319 Degenerative disease of nervous system, unspecified: Secondary | ICD-10-CM | POA: Diagnosis not present

## 2023-05-24 DIAGNOSIS — R5381 Other malaise: Secondary | ICD-10-CM | POA: Diagnosis present

## 2023-05-24 DIAGNOSIS — I5032 Chronic diastolic (congestive) heart failure: Secondary | ICD-10-CM | POA: Diagnosis present

## 2023-05-24 DIAGNOSIS — R531 Weakness: Secondary | ICD-10-CM

## 2023-05-24 DIAGNOSIS — M160 Bilateral primary osteoarthritis of hip: Secondary | ICD-10-CM | POA: Diagnosis not present

## 2023-05-24 DIAGNOSIS — E871 Hypo-osmolality and hyponatremia: Secondary | ICD-10-CM | POA: Diagnosis present

## 2023-05-24 DIAGNOSIS — I480 Paroxysmal atrial fibrillation: Secondary | ICD-10-CM | POA: Diagnosis present

## 2023-05-24 DIAGNOSIS — Z743 Need for continuous supervision: Secondary | ICD-10-CM | POA: Diagnosis not present

## 2023-05-24 DIAGNOSIS — R6889 Other general symptoms and signs: Secondary | ICD-10-CM | POA: Diagnosis not present

## 2023-05-24 DIAGNOSIS — F1011 Alcohol abuse, in remission: Secondary | ICD-10-CM | POA: Diagnosis not present

## 2023-05-24 DIAGNOSIS — K7460 Unspecified cirrhosis of liver: Secondary | ICD-10-CM | POA: Diagnosis present

## 2023-05-24 DIAGNOSIS — K7030 Alcoholic cirrhosis of liver without ascites: Secondary | ICD-10-CM | POA: Diagnosis present

## 2023-05-24 DIAGNOSIS — Z79899 Other long term (current) drug therapy: Secondary | ICD-10-CM | POA: Diagnosis not present

## 2023-05-24 DIAGNOSIS — S299XXA Unspecified injury of thorax, initial encounter: Secondary | ICD-10-CM | POA: Diagnosis not present

## 2023-05-24 DIAGNOSIS — G629 Polyneuropathy, unspecified: Secondary | ICD-10-CM | POA: Diagnosis present

## 2023-05-24 LAB — I-STAT CHEM 8, ED
BUN: <3 mg/dL — ABNORMAL LOW (ref 8–23)
Calcium, Ion: 1.04 mmol/L — ABNORMAL LOW (ref 1.15–1.40)
Chloride: 84 mmol/L — ABNORMAL LOW (ref 98–111)
Creatinine, Ser: 0.4 mg/dL — ABNORMAL LOW (ref 0.61–1.24)
Glucose, Bld: 125 mg/dL — ABNORMAL HIGH (ref 70–99)
HCT: 37 % — ABNORMAL LOW (ref 39.0–52.0)
Hemoglobin: 12.6 g/dL — ABNORMAL LOW (ref 13.0–17.0)
Potassium: 3.1 mmol/L — ABNORMAL LOW (ref 3.5–5.1)
Sodium: 118 mmol/L — CL (ref 135–145)
TCO2: 22 mmol/L (ref 22–32)

## 2023-05-24 LAB — CBC
HCT: 32.1 % — ABNORMAL LOW (ref 39.0–52.0)
Hemoglobin: 11.4 g/dL — ABNORMAL LOW (ref 13.0–17.0)
MCH: 34.5 pg — ABNORMAL HIGH (ref 26.0–34.0)
MCHC: 35.5 g/dL (ref 30.0–36.0)
MCV: 97.3 fL (ref 80.0–100.0)
Platelets: 194 10*3/uL (ref 150–400)
RBC: 3.3 MIL/uL — ABNORMAL LOW (ref 4.22–5.81)
RDW: 17.2 % — ABNORMAL HIGH (ref 11.5–15.5)
WBC: 5.4 10*3/uL (ref 4.0–10.5)
nRBC: 0 % (ref 0.0–0.2)

## 2023-05-24 LAB — BASIC METABOLIC PANEL
Anion gap: 11 (ref 5–15)
Anion gap: 10 (ref 5–15)
BUN: <5 mg/dL — ABNORMAL LOW (ref 8–23)
BUN: <5 mg/dL — ABNORMAL LOW (ref 8–23)
CO2: 23 mmol/L (ref 22–32)
CO2: 22 mmol/L (ref 22–32)
Calcium: 8.9 mg/dL (ref 8.9–10.3)
Calcium: 8.6 mg/dL — ABNORMAL LOW (ref 8.9–10.3)
Chloride: 85 mmol/L — ABNORMAL LOW (ref 98–111)
Chloride: 88 mmol/L — ABNORMAL LOW (ref 98–111)
Creatinine, Ser: 0.6 mg/dL — ABNORMAL LOW (ref 0.61–1.24)
Creatinine, Ser: 0.49 mg/dL — ABNORMAL LOW (ref 0.61–1.24)
GFR, Estimated: >60 mL/min (ref >60)
GFR, Estimated: >60 mL/min (ref >60)
Glucose, Bld: 104 mg/dL — ABNORMAL HIGH (ref 70–99)
Glucose, Bld: 100 mg/dL — ABNORMAL HIGH (ref 70–99)
Potassium: 3 mmol/L — ABNORMAL LOW (ref 3.5–5.1)
Potassium: 3.5 mmol/L (ref 3.5–5.1)
Sodium: 119 mmol/L — CL (ref 135–145)
Sodium: 120 mmol/L — ABNORMAL LOW (ref 135–145)

## 2023-05-24 LAB — I-STAT CG4 LACTIC ACID, ED: Lactic Acid, Venous: 2.4 mmol/L (ref 0.5–1.9)

## 2023-05-24 LAB — COMPREHENSIVE METABOLIC PANEL
ALT: 23 U/L (ref 0–44)
AST: 40 U/L (ref 15–41)
Albumin: 3.8 g/dL (ref 3.5–5.0)
Alkaline Phosphatase: 74 U/L (ref 38–126)
Anion gap: 13 (ref 5–15)
BUN: <5 mg/dL — ABNORMAL LOW (ref 8–23)
CO2: 19 mmol/L — ABNORMAL LOW (ref 22–32)
Calcium: 8.6 mg/dL — ABNORMAL LOW (ref 8.9–10.3)
Chloride: 84 mmol/L — ABNORMAL LOW (ref 98–111)
Creatinine, Ser: 0.59 mg/dL — ABNORMAL LOW (ref 0.61–1.24)
GFR, Estimated: >60 mL/min (ref >60)
Glucose, Bld: 126 mg/dL — ABNORMAL HIGH (ref 70–99)
Potassium: 3.1 mmol/L — ABNORMAL LOW (ref 3.5–5.1)
Sodium: 116 mmol/L — CL (ref 135–145)
Total Bilirubin: 2.2 mg/dL — ABNORMAL HIGH (ref 0.3–1.2)
Total Protein: 6.8 g/dL (ref 6.5–8.1)

## 2023-05-24 LAB — PROTIME-INR
INR: 1.8 — ABNORMAL HIGH (ref 0.8–1.2)
Prothrombin Time: 21 seconds — ABNORMAL HIGH (ref 11.4–15.2)

## 2023-05-24 LAB — URINALYSIS, ROUTINE W REFLEX MICROSCOPIC
Bacteria, UA: NONE SEEN
Bilirubin Urine: NEGATIVE
Glucose, UA: NEGATIVE mg/dL
Ketones, ur: NEGATIVE mg/dL
Leukocytes,Ua: NEGATIVE
Nitrite: NEGATIVE
Protein, ur: NEGATIVE mg/dL
Specific Gravity, Urine: 1.024 (ref 1.005–1.030)
pH: 7 (ref 5.0–8.0)

## 2023-05-24 LAB — MAGNESIUM: Magnesium: 1.8 mg/dL (ref 1.7–2.4)

## 2023-05-24 LAB — OSMOLALITY: Osmolality: 255 mOsm/kg — ABNORMAL LOW (ref 275–295)

## 2023-05-24 MED ORDER — PANTOPRAZOLE SODIUM 40 MG PO TBEC
40.0000 mg | DELAYED_RELEASE_TABLET | Freq: Every day | ORAL | Status: DC
  Administered 2023-05-25 – 2023-05-29 (×5): 40 mg via ORAL
  Filled 2023-05-24 (×5): qty 1

## 2023-05-24 MED ORDER — POLYETHYLENE GLYCOL 3350 17 G PO PACK: 17.0000 g | PACK | Freq: Every day | ORAL | Status: DC | PRN

## 2023-05-24 MED ORDER — DILTIAZEM HCL ER COATED BEADS 120 MG PO CP24
240.0000 mg | ORAL_CAPSULE | Freq: Every day | ORAL | Status: DC
  Administered 2023-05-25 – 2023-05-29 (×5): 240 mg via ORAL
  Filled 2023-05-24: qty 2
  Filled 2023-05-24: qty 1
  Filled 2023-05-24 (×3): qty 2

## 2023-05-24 MED ORDER — IOHEXOL 350 MG/ML SOLN
75.0000 mL | Freq: Once | INTRAVENOUS | Status: AC | PRN
  Administered 2023-05-24: 75 mL via INTRAVENOUS

## 2023-05-24 MED ORDER — MIDODRINE HCL 5 MG PO TABS
10.0000 mg | ORAL_TABLET | Freq: Three times a day (TID) | ORAL | Status: DC
  Administered 2023-05-24 – 2023-05-28 (×13): 10 mg via ORAL
  Filled 2023-05-24 (×11): qty 2

## 2023-05-24 MED ORDER — SODIUM BICARBONATE 650 MG PO TABS
325.0000 mg | ORAL_TABLET | Freq: Two times a day (BID) | ORAL | Status: DC
  Administered 2023-05-24 – 2023-05-26 (×4): 325 mg via ORAL
  Filled 2023-05-24 (×4): qty 1

## 2023-05-24 MED ORDER — APIXABAN 5 MG PO TABS
5.0000 mg | ORAL_TABLET | Freq: Two times a day (BID) | ORAL | Status: DC
  Administered 2023-05-24 – 2023-05-29 (×10): 5 mg via ORAL
  Filled 2023-05-24 (×10): qty 1

## 2023-05-24 MED ORDER — PREGABALIN 100 MG PO CAPS
200.0000 mg | ORAL_CAPSULE | Freq: Three times a day (TID) | ORAL | Status: DC
  Administered 2023-05-24 – 2023-05-29 (×14): 200 mg via ORAL
  Filled 2023-05-24 (×14): qty 2

## 2023-05-24 MED ORDER — MAGNESIUM SULFATE IN D5W 1-5 GM/100ML-% IV SOLN
1.0000 g | Freq: Once | INTRAVENOUS | Status: AC
  Administered 2023-05-24: 1 g via INTRAVENOUS
  Filled 2023-05-24: qty 100

## 2023-05-24 MED ORDER — ACETAMINOPHEN 325 MG PO TABS: 650.0000 mg | ORAL_TABLET | Freq: Four times a day (QID) | ORAL | Status: DC | PRN

## 2023-05-24 MED ORDER — ENOXAPARIN SODIUM 40 MG/0.4ML IJ SOSY: 40.0000 mg | PREFILLED_SYRINGE | INTRAMUSCULAR | Status: DC

## 2023-05-24 MED ORDER — ROPINIROLE HCL 1 MG PO TABS
0.5000 mg | ORAL_TABLET | Freq: Every day | ORAL | Status: DC
  Administered 2023-05-24 – 2023-05-28 (×5): 0.5 mg via ORAL
  Filled 2023-05-24 (×5): qty 1

## 2023-05-24 MED ORDER — ALBUMIN HUMAN 25 % IV SOLN
50.0000 g | Freq: Every day | INTRAVENOUS | Status: DC
  Administered 2023-05-24: 50 g via INTRAVENOUS
  Filled 2023-05-24: qty 200

## 2023-05-24 MED ORDER — FUROSEMIDE 20 MG PO TABS: 40.0000 mg | ORAL_TABLET | Freq: Every day | ORAL | Status: DC

## 2023-05-24 MED ORDER — SODIUM CHLORIDE 0.9% FLUSH
3.0000 mL | Freq: Two times a day (BID) | INTRAVENOUS | Status: DC
  Administered 2023-05-25 – 2023-05-29 (×9): 3 mL via INTRAVENOUS

## 2023-05-24 MED ORDER — POTASSIUM CHLORIDE CRYS ER 20 MEQ PO TBCR
40.0000 meq | EXTENDED_RELEASE_TABLET | Freq: Once | ORAL | Status: AC
  Administered 2023-05-24: 40 meq via ORAL
  Filled 2023-05-24: qty 2

## 2023-05-24 MED ORDER — ACETAMINOPHEN 650 MG RE SUPP: 650.0000 mg | Freq: Four times a day (QID) | RECTAL | Status: DC | PRN

## 2023-05-24 MED ORDER — SODIUM CHLORIDE 0.9 % IV BOLUS
500.0000 mL | Freq: Once | INTRAVENOUS | Status: AC
  Administered 2023-05-24: 500 mL via INTRAVENOUS

## 2023-05-24 MED ORDER — SODIUM CHLORIDE 1 G PO TABS
1.0000 g | ORAL_TABLET | Freq: Three times a day (TID) | ORAL | Status: DC
  Filled 2023-05-24 (×2): qty 1

## 2023-05-24 MED ORDER — SPIRONOLACTONE 12.5 MG HALF TABLET: 25.0000 mg | ORAL_TABLET | Freq: Every day | ORAL | Status: DC

## 2023-05-24 NOTE — ED Triage Notes (Addendum)
Pt arrives via ems to the ed for the c/o fall at [employer] today. Pt states his legs became weak and was not able to hold himself up.  Ems states bp was 108/56 on arrival. Pt hit back of his head, no bleeding. Pt is on elquis for afib. Hr stable per ems. NS given by ems

## 2023-05-24 NOTE — Progress Notes (Signed)
   05/24/23 1434  Spiritual Encounters  Type of Visit Initial  Conversation partners present during encounter Nurse  Referral source Trauma page  Reason for visit Trauma  OnCall Visit Yes  Advance Directives (For Healthcare)  Does Patient Have a Medical Advance Directive? No  Mental Health Advance Directives  Does Patient Have a Mental Health Advance Directive? No   Chaplain responded to a trauma in the ED - level II, fall. No needs at this time. Spiritual care services available as needed.   Alda Ponder, Chaplain 05/24/23

## 2023-05-24 NOTE — ED Provider Notes (Signed)
[place] EMERGENCY DEPARTMENT AT [facility] Provider Note   CSN: [id number] Arrival date & time: 05/24/23  1423     History  Chief Complaint  Patient presents with   Marletta Lor    Jeffrey Randolph is a 67 y.o. male.  HPI Patient presents for fall.  Medical history includes CVA, atrial fibrillation, GERD.  He is prescribed Eliquis.  Most recent dose was this morning.  Patient reports that he has had generalized weakness for the past month.  2 weeks ago, he was admitted to [facility] for hyponatremia.  Today, he was standing in line at [facility].  He experienced worsened weakness to his legs.  This caused him to fall.  When he fell, he fell backwards and did strike the back of his head.  He did not lose consciousness.  EMS noted soft blood pressures that worsened with standing.  He did receive a small amount of IV fluid prior to arrival.  Currently, he endorses fatigue and generalized weakness.  He denies any areas of pain.    Home Medications Prior to Admission medications   Medication Sig Start Date End Date Taking? Authorizing Provider  apixaban (ELIQUIS) 5 MG TABS tablet Take 1 tablet (5 mg total) by mouth 2 (two) times daily for 30 days. 12/22/18 05/08/23  Sabas Sous, MD  diltiazem (CARDIZEM CD) 240 MG 24 hr capsule Take 1 capsule (240 mg total) by mouth daily. 12/01/20   Fenton, Clint R, PA  DULoxetine (CYMBALTA) 30 MG capsule Take 30 mg by mouth daily.    [provider]  furosemide (LASIX) 40 MG tablet Take 1 tablet (40 mg total) by mouth daily. 05/13/23   Darlin Priestly, MD  omeprazole (PRILOSEC) 40 MG capsule Take 40 mg by mouth daily.    [provider]  pregabalin (LYRICA) 200 MG capsule Take 200 mg by mouth 3 (three) times daily. 02/21/23   [provider]  rOPINIRole (REQUIP) 0.5 MG tablet Take 0.5 mg by mouth at bedtime.    [provider]  sodium bicarbonate 325 MG tablet Take 1 tablet (325 mg total) by mouth 2 (two) times daily.  05/12/23   Darlin Priestly, MD  sodium chloride 1 g tablet Take 1 tablet (1 g total) by mouth 3 (three) times daily with meals. 05/12/23   Darlin Priestly, MD  spironolactone (ALDACTONE) 25 MG tablet Take 1 tablet (25 mg total) by mouth daily. 05/13/23   Darlin Priestly, MD      Allergies    Patient has no known allergies.    Review of Systems   Review of Systems  Constitutional:  Positive for fatigue.  Neurological:  Positive for weakness (Generalized).  All other systems reviewed and are negative.   Physical Exam Updated Vital Signs BP (!) 105/58   Pulse (!) 59   Temp 97.8 F (36.6 C) (Oral)   Resp 18   Ht 5\' 9"  (1.753 m)   Wt 79.4 kg   SpO2 100%   BMI 25.84 kg/m  Physical Exam Vitals and nursing note reviewed.  Constitutional:      General: He is not in acute distress.    Appearance: Normal appearance. He is well-developed. He is not ill-appearing, toxic-appearing or diaphoretic.  HENT:     Head: Normocephalic.     Comments: Small occipital hematoma.  Skin is intact.    Right Ear: External ear normal.     Left Ear: External ear normal.     Nose: Nose normal.  Mouth/Throat:     Mouth: Mucous membranes are moist.  Eyes:     Extraocular Movements: Extraocular movements intact.     Conjunctiva/sclera: Conjunctivae normal.  Cardiovascular:     Rate and Rhythm: Normal rate and regular rhythm.     Heart sounds: No murmur heard. Pulmonary:     Effort: Pulmonary effort is normal. No respiratory distress.     Breath sounds: Normal breath sounds. No wheezing or rales.  Chest:     Chest wall: No tenderness.  Abdominal:     General: There is no distension.     Palpations: Abdomen is soft.     Tenderness: There is no abdominal tenderness.  Musculoskeletal:        General: No swelling. Normal range of motion.     Cervical back: Normal range of motion and neck supple.     Right lower leg: No edema.     Left lower leg: No edema.  Skin:    General: Skin is warm and dry.     Coloration: Skin  is not jaundiced or pale.  Neurological:     General: No focal deficit present.     Mental Status: He is alert and oriented to person, place, and time.  Psychiatric:        Mood and Affect: Mood normal. Affect is flat.        Speech: Speech is delayed.        Behavior: Behavior is slowed. Behavior is cooperative.     ED Results / Procedures / Treatments   Labs (all labs ordered are listed, but only abnormal results are displayed) Labs Reviewed  COMPREHENSIVE METABOLIC PANEL - Abnormal; Notable for the following components:      Result Value   Sodium 116 (*)    Potassium 3.1 (*)    Chloride 84 (*)    CO2 19 (*)    Glucose, Bld 126 (*)    BUN <5 (*)    Creatinine, Ser 0.59 (*)    Calcium 8.6 (*)    Total Bilirubin 2.2 (*)    All other components within normal limits  CBC - Abnormal; Notable for the following components:   RBC 3.30 (*)    Hemoglobin 11.4 (*)    HCT 32.1 (*)    MCH 34.5 (*)    RDW 17.2 (*)    All other components within normal limits  URINALYSIS, ROUTINE W REFLEX MICROSCOPIC - Abnormal; Notable for the following components:   Hgb urine dipstick SMALL (*)    All other components within normal limits  PROTIME-INR - Abnormal; Notable for the following components:   Prothrombin Time 21.0 (*)    INR 1.8 (*)    All other components within normal limits  I-STAT CHEM 8, ED - Abnormal; Notable for the following components:   Sodium 118 (*)    Potassium 3.1 (*)    Chloride 84 (*)    BUN <3 (*)    Creatinine, Ser 0.40 (*)    Glucose, Bld 125 (*)    Calcium, Ion 1.04 (*)    Hemoglobin 12.6 (*)    HCT 37.0 (*)    All other components within normal limits  I-STAT CG4 LACTIC ACID, ED - Abnormal; Notable for the following components:   Lactic Acid, Venous 2.4 (*)    All other components within normal limits  MAGNESIUM  ETHANOL  NA AND K (SODIUM & POTASSIUM), RAND UR  OSMOLALITY  BASIC METABOLIC PANEL  BASIC METABOLIC PANEL  BASIC METABOLIC PANEL  BASIC  METABOLIC PANEL  CBC    EKG None  Radiology DG Pelvis Portable  Result Date: 05/24/2023 CLINICAL DATA:  Fall. EXAM: PORTABLE PELVIS 1-2 VIEWS COMPARISON:  None Available. FINDINGS: Minimal osteoarthritic change of the hips. No acute fracture or dislocation. Contrast within the bladder. Degenerative change of the spine. IMPRESSION: 1. No acute findings. 2. Minimal osteoarthritic change of the hips. Electronically Signed   By: Elberta Fortis M.D.   On: 05/24/2023 16:00   DG Chest Port 1 View  Result Date: 05/24/2023 CLINICAL DATA:  Fall. EXAM: PORTABLE CHEST 1 VIEW COMPARISON:  03/20/2022 FINDINGS: Lungs are adequately inflated with mild linear density over the right midlung likely atelectasis. Subtle no pleural effusion or pneumothorax. Cardiomediastinal silhouette is normal. No evidence of fracture. IMPRESSION: Mild linear density over the right midlung likely atelectasis. Electronically Signed   By: Elberta Fortis M.D.   On: 05/24/2023 15:59   CT CHEST ABDOMEN PELVIS W CONTRAST  Result Date: 05/24/2023 CLINICAL DATA:  Polytrauma, blunt, fall at restaurant today with head injury, anticoagulated for atrial fibrillation EXAM: CT CHEST, ABDOMEN, AND PELVIS WITH CONTRAST TECHNIQUE: Multidetector CT imaging of the chest, abdomen and pelvis was performed following the standard protocol during bolus administration of intravenous contrast. RADIATION DOSE REDUCTION: This exam was performed according to the departmental dose-optimization program which includes automated exposure control, adjustment of the mA and/or kV according to patient size and/or use of iterative reconstruction technique. CONTRAST:  75mL OMNIPAQUE IOHEXOL 350 MG/ML SOLN COMPARISON:  03/20/2022 CT chest, abdomen and pelvis FINDINGS: CT CHEST FINDINGS Cardiovascular: Normal heart size. No significant pericardial fluid/thickening. Left anterior descending coronary atherosclerosis. Atherosclerotic nonaneurysmal thoracic aorta. Normal caliber  pulmonary arteries. No evidence of acute thoracic aortic injury. No central pulmonary emboli. Mediastinum/Nodes: No pneumomediastinum. No mediastinal hematoma. No discrete thyroid nodules. Unremarkable esophagus. No axillary, mediastinal or hilar lymphadenopathy. Lungs/Pleura: No pneumothorax. No pleural effusion. Solid 3 mm right LL pulmonary nodule (series 5/image 105) is stable and considered benign. No acute consolidative airspace disease, lung masses or additional significant pulmonary nodules. Musculoskeletal: No aggressive appearing focal osseous lesions. No acute fracture in the chest. Healed chronic lateral right ninth rib fracture. Mild thoracic spondylosis. Symmetric mild gynecomastia, similar. CT ABDOMEN PELVIS FINDINGS Hepatobiliary: Normal liver with no liver laceration or mass. Cholelithiasis. No biliary ductal dilatation. Pancreas: Normal, with no laceration, mass or duct dilation. Spleen: Normal size. No laceration or mass. Adrenals/Urinary Tract: Normal adrenals. No hydronephrosis. No renal laceration. No renal mass. Normal bladder. Stomach/Bowel: Grossly normal stomach. Normal caliber small bowel with no small bowel wall thickening. Appendix not discretely visualized. Normal large bowel with no diverticulosis, large bowel wall thickening or pericolonic fat stranding. Vascular/Lymphatic: Atherosclerotic nonaneurysmal abdominal aorta. Patent renal veins. No pathologically enlarged lymph nodes in the abdomen or pelvis. Reproductive: Normal size prostate. Other: No pneumoperitoneum, ascites or focal fluid collection. Musculoskeletal: No aggressive appearing focal osseous lesions. No acute fracture. Marked multilevel lumbar degenerative disc disease. Mild superior L1 vertebral compression deformity is chronic and unchanged. IMPRESSION: 1. No evidence of acute traumatic injury in the chest, abdomen or pelvis. 2. Cholelithiasis. 3. One vessel coronary atherosclerosis. 4. Symmetric mild gynecomastia,  similar. Electronically Signed   By: Delbert Phenix M.D.   On: 05/24/2023 15:54   CT T-SPINE NO CHARGE  Result Date: 05/24/2023 CLINICAL DATA:  Trauma, back pain EXAM: CT THORACIC AND LUMBAR SPINE WITH CONTRAST TECHNIQUE: Multiplanar CT images of the thoracic and lumbar spine were reconstructed from contemporary CT of  the Chest, Abdomen, and Pelvis. RADIATION DOSE REDUCTION: This exam was performed according to the departmental dose-optimization program which includes automated exposure control, adjustment of the mA and/or kV according to patient size and/or use of iterative reconstruction technique. CONTRAST:  None or No additional COMPARISON:  03/20/2022 FINDINGS: CT THORACIC AND LUMBAR SPINE FINDINGS Alignment: Normal thoracic kyphosis. Normal lumbar lordosis. Vertebral bodies: Unchanged mild superior endplate wedge deformity of L1, with no greater than 20% anterior height loss. Vertebral bodies otherwise intact. No other fracture or dislocation. Disc spaces: Generally mild disc space height loss and osteophytosis of the cervical spine, focally severe at L2-L3. Paraspinous soft tissues: Coronary artery calcifications. Gallstones. Aortic atherosclerosis. IMPRESSION: 1. No acute fracture or dislocation of the thoracic or lumbar spine. 2. Unchanged mild superior endplate wedge deformity of L1, with no greater than 20% anterior height loss. 3. Coronary artery disease. 4. Cholelithiasis. Aortic Atherosclerosis (ICD10-I70.0). Electronically Signed   By: Jearld Lesch M.D.   On: 05/24/2023 15:52   CT L-SPINE NO CHARGE  Result Date: 05/24/2023 CLINICAL DATA:  Trauma, back pain EXAM: CT THORACIC AND LUMBAR SPINE WITH CONTRAST TECHNIQUE: Multiplanar CT images of the thoracic and lumbar spine were reconstructed from contemporary CT of the Chest, Abdomen, and Pelvis. RADIATION DOSE REDUCTION: This exam was performed according to the departmental dose-optimization program which includes automated exposure control,  adjustment of the mA and/or kV according to patient size and/or use of iterative reconstruction technique. CONTRAST:  None or No additional COMPARISON:  03/20/2022 FINDINGS: CT THORACIC AND LUMBAR SPINE FINDINGS Alignment: Normal thoracic kyphosis. Normal lumbar lordosis. Vertebral bodies: Unchanged mild superior endplate wedge deformity of L1, with no greater than 20% anterior height loss. Vertebral bodies otherwise intact. No other fracture or dislocation. Disc spaces: Generally mild disc space height loss and osteophytosis of the cervical spine, focally severe at L2-L3. Paraspinous soft tissues: Coronary artery calcifications. Gallstones. Aortic atherosclerosis. IMPRESSION: 1. No acute fracture or dislocation of the thoracic or lumbar spine. 2. Unchanged mild superior endplate wedge deformity of L1, with no greater than 20% anterior height loss. 3. Coronary artery disease. 4. Cholelithiasis. Aortic Atherosclerosis (ICD10-I70.0). Electronically Signed   By: Jearld Lesch M.D.   On: 05/24/2023 15:52   CT HEAD WO CONTRAST  Result Date: 05/24/2023 CLINICAL DATA:  Poly trauma, blunt EXAM: CT HEAD WITHOUT CONTRAST CT CERVICAL SPINE WITHOUT CONTRAST TECHNIQUE: Multidetector CT imaging of the head and cervical spine was performed following the standard protocol without intravenous contrast. Multiplanar CT image reconstructions of the cervical spine were also generated. RADIATION DOSE REDUCTION: This exam was performed according to the departmental dose-optimization program which includes automated exposure control, adjustment of the mA and/or kV according to patient size and/or use of iterative reconstruction technique. COMPARISON:  None Available. FINDINGS: CT HEAD FINDINGS Brain: No evidence of acute infarction, hemorrhage, extra-axial collection, ventriculomegaly, or mass effect. Generalized cerebral atrophy. Periventricular white matter low attenuation likely secondary to microangiopathy. Vascular:  Cerebrovascular atherosclerotic calcifications are noted. No hyperdense vessels. Skull: Negative for fracture or focal lesion. Sinuses/Orbits: Visualized portions of the orbits are unremarkable. Visualized portions of the paranasal sinuses are unremarkable. Visualized portions of the mastoid air cells are unremarkable. Other: None. CT CERVICAL SPINE FINDINGS Alignment: Normal. Skull base and vertebrae: No acute fracture. No primary bone lesion or focal pathologic process. Soft tissues and spinal canal: No prevertebral fluid or swelling. No visible canal hematoma. Disc levels: Degenerative disease with disc height loss C3-4. Mild broad-based disc osteophyte complex at C3-4 with bilateral uncovertebral  degenerative changes and bilateral foraminal stenosis. Mild bilateral facet arthropathy at C3-4. At C4-5 there is a broad central disc protrusion and left foraminal stenosis. At C5-6 there is a mild broad-based disc bulge. At C6-7 there is a right foraminal disc protrusion. Upper chest: Lung apices are clear. Other: No fluid collection or hematoma. Bilateral carotid artery atherosclerosis. IMPRESSION: 1. No acute intracranial pathology. 2. No acute osseous injury of the cervical spine. 3. Cervical spine spondylosis as described above. Electronically Signed   By: Elige Ko M.D.   On: 05/24/2023 15:48   CT CERVICAL SPINE WO CONTRAST  Result Date: 05/24/2023 CLINICAL DATA:  Poly trauma, blunt EXAM: CT HEAD WITHOUT CONTRAST CT CERVICAL SPINE WITHOUT CONTRAST TECHNIQUE: Multidetector CT imaging of the head and cervical spine was performed following the standard protocol without intravenous contrast. Multiplanar CT image reconstructions of the cervical spine were also generated. RADIATION DOSE REDUCTION: This exam was performed according to the departmental dose-optimization program which includes automated exposure control, adjustment of the mA and/or kV according to patient size and/or use of iterative reconstruction  technique. COMPARISON:  None Available. FINDINGS: CT HEAD FINDINGS Brain: No evidence of acute infarction, hemorrhage, extra-axial collection, ventriculomegaly, or mass effect. Generalized cerebral atrophy. Periventricular white matter low attenuation likely secondary to microangiopathy. Vascular: Cerebrovascular atherosclerotic calcifications are noted. No hyperdense vessels. Skull: Negative for fracture or focal lesion. Sinuses/Orbits: Visualized portions of the orbits are unremarkable. Visualized portions of the paranasal sinuses are unremarkable. Visualized portions of the mastoid air cells are unremarkable. Other: None. CT CERVICAL SPINE FINDINGS Alignment: Normal. Skull base and vertebrae: No acute fracture. No primary bone lesion or focal pathologic process. Soft tissues and spinal canal: No prevertebral fluid or swelling. No visible canal hematoma. Disc levels: Degenerative disease with disc height loss C3-4. Mild broad-based disc osteophyte complex at C3-4 with bilateral uncovertebral degenerative changes and bilateral foraminal stenosis. Mild bilateral facet arthropathy at C3-4. At C4-5 there is a broad central disc protrusion and left foraminal stenosis. At C5-6 there is a mild broad-based disc bulge. At C6-7 there is a right foraminal disc protrusion. Upper chest: Lung apices are clear. Other: No fluid collection or hematoma. Bilateral carotid artery atherosclerosis. IMPRESSION: 1. No acute intracranial pathology. 2. No acute osseous injury of the cervical spine. 3. Cervical spine spondylosis as described above. Electronically Signed   By: Elige Ko M.D.   On: 05/24/2023 15:48    Procedures Procedures    Medications Ordered in ED Medications  diltiazem (CARDIZEM CD) 24 hr capsule 240 mg (has no administration in time range)  spironolactone (ALDACTONE) tablet 25 mg (has no administration in time range)  pantoprazole (PROTONIX) EC tablet 40 mg (has no administration in time range)  sodium  bicarbonate tablet 325 mg (has no administration in time range)  pregabalin (LYRICA) capsule 200 mg (has no administration in time range)  rOPINIRole (REQUIP) tablet 0.5 mg (has no administration in time range)  sodium chloride tablet 1 g (has no administration in time range)  enoxaparin (LOVENOX) injection 40 mg (has no administration in time range)  sodium chloride flush (NS) 0.9 % injection 3 mL (has no administration in time range)  acetaminophen (TYLENOL) tablet 650 mg (has no administration in time range)    Or  acetaminophen (TYLENOL) suppository 650 mg (has no administration in time range)  polyethylene glycol (MIRALAX / GLYCOLAX) packet 17 g (has no administration in time range)  iohexol (OMNIPAQUE) 350 MG/ML injection 75 mL (75 mLs Intravenous Contrast Given 05/24/23 1507)  sodium chloride 0.9 % bolus 500 mL (0 mLs Intravenous Stopped 05/24/23 1644)  potassium chloride SA (KLOR-CON M) CR tablet 40 mEq (40 mEq Oral Given 05/24/23 1600)  magnesium sulfate IVPB 1 g 100 mL (0 g Intravenous Stopped 05/24/23 1724)    ED Course/ Medical Decision Making/ A&P                                 Medical Decision Making Amount and/or Complexity of Data Reviewed Labs: ordered. Radiology: ordered.  Risk Prescription drug management. Decision regarding hospitalization.   This patient presents to the ED for concern of generalized weakness and fall, this involves an extensive number of treatment options, and is a complaint that carries with it a high risk of complications and morbidity.  The differential diagnosis includes acute injuries, dehydration, polypharmacy, infection, metabolic derangements, anemia   Co morbidities that complicate the patient evaluation  CVA, atrial fibrillation, GERD   Additional history obtained:  Additional history obtained from EMS External records from outside source obtained and reviewed including EMR   Lab Tests:  I Ordered, and personally interpreted  labs.  The pertinent results include: Hyponatremia and hypokalemia present.  There is a non-anion gap metabolic acidosis.  Anemia is baseline.  No leukocytosis is present.   Imaging Studies ordered:  I ordered imaging studies including x-ray of chest and pelvis, CT of head, cervical spine, chest, abdomen, pelvis, T-spine, L-spine I independently visualized and interpreted imaging which showed no acute findings I agree with the radiologist interpretation   Cardiac Monitoring: / EKG:  The patient was maintained on a cardiac monitor.  I personally viewed and interpreted the cardiac monitored which showed an underlying rhythm of: Sinus rhythm   Consultations Obtained:  I requested consultation with the intensivist, Dr. Tonia Brooms,  and discussed lab and imaging findings as well as pertinent plan - they recommend: Admission to hospitalist   Problem List / ED Course / Critical interventions / Medication management  Patient presenting after a ground-level fall.  This occurred at [facility], following note patient describes as worsening generalized weakness in his lower legs.  He states that he has felt weak for the past month.  2 weeks ago, he was admitted to the hospital for hyponatremia.  He is on Eliquis and did take his last dose this morning.  On arrival, he is slowed in his speech and movements.  He is able to answer questions appropriately.  He has a small occipital hematoma on exam.  He has no other areas of deformity.  He does have tenderness in area of lumbar spine.  He states that this is chronic for him.  Trauma workup was initiated.  No acute findings were identified on imaging studies.  On lab work, patient was found to have recurrence of hyponatremia.  Sodium today was 116.  I suspect this is the cause of his recent weakness.  Patient was given normal saline IV fluids.  Replacement electrolytes were given for hypokalemia I spoke with intensivist, Dr. Tonia Brooms, who does not feel the patient has  ICU needs at this time.  Patient was admitted to hospitalist for further management. I ordered medication including IV fluids for duration; potassium chloride for hypokalemia; calcium gluconate for hypocalcemia Reevaluation of the patient after these medicines showed that the patient improved I have reviewed the patients home medicines and have made adjustments as needed   Social Determinants of Health:  Has access  to outpatient care  CRITICAL CARE Performed by: Gloris Manchester   Total critical care time: 34 minutes  Critical care time was exclusive of separately billable procedures and treating other patients.  Critical care was necessary to treat or prevent imminent or life-threatening deterioration.  Critical care was time spent personally by me on the following activities: development of treatment plan with patient and/or surrogate as well as nursing, discussions with consultants, evaluation of patient's response to treatment, examination of patient, obtaining history from patient or surrogate, ordering and performing treatments and interventions, ordering and review of laboratory studies, ordering and review of radiographic studies, pulse oximetry and re-evaluation of patient's condition.         Final Clinical Impression(s) / ED Diagnoses Final diagnoses:  Hyponatremia  Generalized weakness  Fall, initial encounter  Hypokalemia    Rx / DC Orders ED Discharge Orders     None         Gloris Manchester, MD 05/24/23 1749

## 2023-05-24 NOTE — H&P (Signed)
History and Physical   Zebedee Whittenberg HCW:[id number] DOB: 14-Jul-1956 DOA: 05/24/2023  PCP: Lonie Peak, PA-C   Patient coming from: Home  Chief Complaint: Fall  HPI: Rahil Reh is a 67 y.o. male with medical history significant of hypertension, atrial fibrillation, stroke, spinal stenosis, cirrhosis, alcohol use, hyponatremia presenting after a fall today.  Patient was at [facility] today when he began to feel significantly weak in the legs and fell to the ground.  9 1 was called and on EMS arrival patient blood pressure is within the 100s systolic.  Denies any loss of consciousness.  Did hit his head.  No bleeding.  He is on Eliquis.  Received 100 cc of fluids and route.  Does report some ongoing fatigue and generalized weakness this weakness was present prior to most recent admission as well.  During recent admission patient was treated for hyponatremia, hypokalemia, norovirus.  Thought his hypoatrium was exacerbated in the setting of norovirus and complicated by cirrhosis and some diastolic CHF.  Discharged with stable sodium of 125 for several days.  Recommended to take salt tabs, fluid restrict, continue with home Lasix and spironolactone.  Patient states he has been taking his medications and salt tablets as recommended.  He denies fevers, chills, chest pain, shortness of breath, abdominal pain, constipation, diarrhea, nausea, vomiting.  ED Course: Vital signs in the ED notable for blood pressure in the 100 systolic.  Lab workup included CMP with sodium 116, chloride 84, potassium 3.1, bicarb 19, glucose 126, calcium 8.6, and T. bili 2.2.  CBC with hemoglobin stable 11.4.  PT mildly elevated at 21 and INR mildly elevated at 1.8.  I-STAT lactic acid mildly elevated 2.4.  Ethanol level pending.  Urinalysis with hemoglobin only.  Urine sodium and urine potassium pending.  Magnesium normal.  Patient did receive 1 g IV magnesium, 40 mEq p.o. potassium, 500 cc IV fluids in the ED.  Critical care  consulted due to the degree of hyponatremia but given his current sodium level on chronic hyponatremia and the fact that patient was neither unconscious nor seizing they recommended hospitalist admission.  Review of Systems: As per HPI otherwise all other systems reviewed and are negative.  Past Medical History:  Diagnosis Date   Atrial fibrillation with rapid ventricular response (HCC)    BPH (benign prostatic hyperplasia)    CVA (cerebral infarction)    Dysrhythmia    GERD (gastroesophageal reflux disease)    History of stroke 05/26/2019   HNP (herniated nucleus pulposus), lumbar 01/09/2015   Hypertension    Hypokalemia    Hypomagnesemia 05/26/2019   Stroke (HCC)    Left foot always feel 2 x larger, walks with a limp.    Past Surgical History:  Procedure Laterality Date   ESOPHAGOGASTRODUODENOSCOPY (EGD) WITH PROPOFOL N/A 01/06/2023   Procedure: ESOPHAGOGASTRODUODENOSCOPY (EGD) WITH PROPOFOL;  Surgeon: Wyline Mood, MD;  Location: [facility] ENDOSCOPY;  Service: Gastroenterology;  Laterality: N/A;  Wants close to 9 AM arrival   LUMBAR LAMINECTOMY/DECOMPRESSION MICRODISCECTOMY Right 01/09/2015   Procedure: LUMBAR LAMINECTOMY/DECOMPRESSION MICRODISCECTOMY 1 LEVEL;  Surgeon: Donalee Citrin, MD;  Location: [facility] NEURO ORS;  Service: Neurosurgery;  Laterality: Right;  LUMBAR LAMINECTOMY/DECOMPRESSION MICRODISCECTOMY 1 LEVEL RIGHT LUMBAR 2-3   TONSILLECTOMY      Social History  reports that he has quit smoking. His smoking use included cigarettes. He has never used smokeless tobacco. He reports that he does not currently use alcohol. He reports that he does not use drugs.  No Known Allergies  Family History  Problem Relation Age of Onset   Pancreatic cancer Mother   Reviewed on admission  Prior to Admission medications   Medication Sig Start Date End Date Taking? Authorizing Provider  apixaban (ELIQUIS) 5 MG TABS tablet Take 1 tablet (5 mg total) by mouth 2 (two) times daily for 30 days. 12/22/18  05/08/23  Sabas Sous, MD  diltiazem (CARDIZEM CD) 240 MG 24 hr capsule Take 1 capsule (240 mg total) by mouth daily. 12/01/20   Fenton, Clint R, PA  DULoxetine (CYMBALTA) 30 MG capsule Take 30 mg by mouth daily.    [provider]  furosemide (LASIX) 40 MG tablet Take 1 tablet (40 mg total) by mouth daily. 05/13/23   Darlin Priestly, MD  omeprazole (PRILOSEC) 40 MG capsule Take 40 mg by mouth daily.    [provider]  pregabalin (LYRICA) 200 MG capsule Take 200 mg by mouth 3 (three) times daily. 02/21/23   [provider]  rOPINIRole (REQUIP) 0.5 MG tablet Take 0.5 mg by mouth at bedtime.    [provider]  sodium bicarbonate 325 MG tablet Take 1 tablet (325 mg total) by mouth 2 (two) times daily. 05/12/23   Darlin Priestly, MD  sodium chloride 1 g tablet Take 1 tablet (1 g total) by mouth 3 (three) times daily with meals. 05/12/23   Darlin Priestly, MD  spironolactone (ALDACTONE) 25 MG tablet Take 1 tablet (25 mg total) by mouth daily. 05/13/23   Darlin Priestly, MD    Physical Exam: Vitals:   05/24/23 1427 05/24/23 1430 05/24/23 1432 05/24/23 1600  BP:  103/63  (!) 105/58  Pulse:  67  (!) 59  Resp:  17  18  Temp:  97.8 F (36.6 C)    TempSrc:  Oral    SpO2: 98% 100%  100%  Weight:   79.4 kg   Height:   5\' 9"  (1.753 m)     Physical Exam  Labs on Admission: I have personally reviewed following labs and imaging studies  CBC: Recent Labs  Lab 05/24/23 1445 05/24/23 1456  WBC 5.4  --   HGB 11.4* 12.6*  HCT 32.1* 37.0*  MCV 97.3  --   PLT 194  --     Basic Metabolic Panel: Recent Labs  Lab 05/24/23 1445 05/24/23 1456  NA 116* 118*  K 3.1* 3.1*  CL 84* 84*  CO2 19*  --   GLUCOSE 126* 125*  BUN <5* <3*  CREATININE 0.59* 0.40*  CALCIUM 8.6*  --   MG 1.8  --     GFR: Estimated Creatinine Clearance: 89.6 mL/min (A) (by C-G formula based on SCr of 0.4 mg/dL (L)).  Liver Function Tests: Recent Labs  Lab 05/24/23 1445  AST 40  ALT 23  ALKPHOS 74  BILITOT  2.2*  PROT 6.8  ALBUMIN 3.8    Urine analysis:    Component Value Date/Time   COLORURINE YELLOW 05/24/2023 1621   APPEARANCEUR CLEAR 05/24/2023 1621   LABSPEC 1.024 05/24/2023 1621   PHURINE 7.0 05/24/2023 1621   GLUCOSEU NEGATIVE 05/24/2023 1621   HGBUR SMALL (A) 05/24/2023 1621   BILIRUBINUR NEGATIVE 05/24/2023 1621   KETONESUR NEGATIVE 05/24/2023 1621   PROTEINUR NEGATIVE 05/24/2023 1621   UROBILINOGEN 0.2 08/03/2011 1937   NITRITE NEGATIVE 05/24/2023 1621   LEUKOCYTESUR NEGATIVE 05/24/2023 1621    Radiological Exams on Admission: DG Pelvis Portable  Result Date: 05/24/2023 CLINICAL DATA:  Fall. EXAM: PORTABLE PELVIS 1-2 VIEWS COMPARISON:  None Available. FINDINGS: Minimal osteoarthritic  change of the hips. No acute fracture or dislocation. Contrast within the bladder. Degenerative change of the spine. IMPRESSION: 1. No acute findings. 2. Minimal osteoarthritic change of the hips. Electronically Signed   By: Elberta Fortis M.D.   On: 05/24/2023 16:00   DG Chest Port 1 View  Result Date: 05/24/2023 CLINICAL DATA:  Fall. EXAM: PORTABLE CHEST 1 VIEW COMPARISON:  03/20/2022 FINDINGS: Lungs are adequately inflated with mild linear density over the right midlung likely atelectasis. Subtle no pleural effusion or pneumothorax. Cardiomediastinal silhouette is normal. No evidence of fracture. IMPRESSION: Mild linear density over the right midlung likely atelectasis. Electronically Signed   By: Elberta Fortis M.D.   On: 05/24/2023 15:59   CT CHEST ABDOMEN PELVIS W CONTRAST  Result Date: 05/24/2023 CLINICAL DATA:  Polytrauma, blunt, fall at restaurant today with head injury, anticoagulated for atrial fibrillation EXAM: CT CHEST, ABDOMEN, AND PELVIS WITH CONTRAST TECHNIQUE: Multidetector CT imaging of the chest, abdomen and pelvis was performed following the standard protocol during bolus administration of intravenous contrast. RADIATION DOSE REDUCTION: This exam was performed according to the  departmental dose-optimization program which includes automated exposure control, adjustment of the mA and/or kV according to patient size and/or use of iterative reconstruction technique. CONTRAST:  75mL OMNIPAQUE IOHEXOL 350 MG/ML SOLN COMPARISON:  03/20/2022 CT chest, abdomen and pelvis FINDINGS: CT CHEST FINDINGS Cardiovascular: Normal heart size. No significant pericardial fluid/thickening. Left anterior descending coronary atherosclerosis. Atherosclerotic nonaneurysmal thoracic aorta. Normal caliber pulmonary arteries. No evidence of acute thoracic aortic injury. No central pulmonary emboli. Mediastinum/Nodes: No pneumomediastinum. No mediastinal hematoma. No discrete thyroid nodules. Unremarkable esophagus. No axillary, mediastinal or hilar lymphadenopathy. Lungs/Pleura: No pneumothorax. No pleural effusion. Solid 3 mm right LL pulmonary nodule (series 5/image 105) is stable and considered benign. No acute consolidative airspace disease, lung masses or additional significant pulmonary nodules. Musculoskeletal: No aggressive appearing focal osseous lesions. No acute fracture in the chest. Healed chronic lateral right ninth rib fracture. Mild thoracic spondylosis. Symmetric mild gynecomastia, similar. CT ABDOMEN PELVIS FINDINGS Hepatobiliary: Normal liver with no liver laceration or mass. Cholelithiasis. No biliary ductal dilatation. Pancreas: Normal, with no laceration, mass or duct dilation. Spleen: Normal size. No laceration or mass. Adrenals/Urinary Tract: Normal adrenals. No hydronephrosis. No renal laceration. No renal mass. Normal bladder. Stomach/Bowel: Grossly normal stomach. Normal caliber small bowel with no small bowel wall thickening. Appendix not discretely visualized. Normal large bowel with no diverticulosis, large bowel wall thickening or pericolonic fat stranding. Vascular/Lymphatic: Atherosclerotic nonaneurysmal abdominal aorta. Patent renal veins. No pathologically enlarged lymph nodes in  the abdomen or pelvis. Reproductive: Normal size prostate. Other: No pneumoperitoneum, ascites or focal fluid collection. Musculoskeletal: No aggressive appearing focal osseous lesions. No acute fracture. Marked multilevel lumbar degenerative disc disease. Mild superior L1 vertebral compression deformity is chronic and unchanged. IMPRESSION: 1. No evidence of acute traumatic injury in the chest, abdomen or pelvis. 2. Cholelithiasis. 3. One vessel coronary atherosclerosis. 4. Symmetric mild gynecomastia, similar. Electronically Signed   By: Delbert Phenix M.D.   On: 05/24/2023 15:54   CT T-SPINE NO CHARGE  Result Date: 05/24/2023 CLINICAL DATA:  Trauma, back pain EXAM: CT THORACIC AND LUMBAR SPINE WITH CONTRAST TECHNIQUE: Multiplanar CT images of the thoracic and lumbar spine were reconstructed from contemporary CT of the Chest, Abdomen, and Pelvis. RADIATION DOSE REDUCTION: This exam was performed according to the departmental dose-optimization program which includes automated exposure control, adjustment of the mA and/or kV according to patient size and/or use of iterative reconstruction technique. CONTRAST:  None or No additional COMPARISON:  03/20/2022 FINDINGS: CT THORACIC AND LUMBAR SPINE FINDINGS Alignment: Normal thoracic kyphosis. Normal lumbar lordosis. Vertebral bodies: Unchanged mild superior endplate wedge deformity of L1, with no greater than 20% anterior height loss. Vertebral bodies otherwise intact. No other fracture or dislocation. Disc spaces: Generally mild disc space height loss and osteophytosis of the cervical spine, focally severe at L2-L3. Paraspinous soft tissues: Coronary artery calcifications. Gallstones. Aortic atherosclerosis. IMPRESSION: 1. No acute fracture or dislocation of the thoracic or lumbar spine. 2. Unchanged mild superior endplate wedge deformity of L1, with no greater than 20% anterior height loss. 3. Coronary artery disease. 4. Cholelithiasis. Aortic Atherosclerosis  (ICD10-I70.0). Electronically Signed   By: Jearld Lesch M.D.   On: 05/24/2023 15:52   CT L-SPINE NO CHARGE  Result Date: 05/24/2023 CLINICAL DATA:  Trauma, back pain EXAM: CT THORACIC AND LUMBAR SPINE WITH CONTRAST TECHNIQUE: Multiplanar CT images of the thoracic and lumbar spine were reconstructed from contemporary CT of the Chest, Abdomen, and Pelvis. RADIATION DOSE REDUCTION: This exam was performed according to the departmental dose-optimization program which includes automated exposure control, adjustment of the mA and/or kV according to patient size and/or use of iterative reconstruction technique. CONTRAST:  None or No additional COMPARISON:  03/20/2022 FINDINGS: CT THORACIC AND LUMBAR SPINE FINDINGS Alignment: Normal thoracic kyphosis. Normal lumbar lordosis. Vertebral bodies: Unchanged mild superior endplate wedge deformity of L1, with no greater than 20% anterior height loss. Vertebral bodies otherwise intact. No other fracture or dislocation. Disc spaces: Generally mild disc space height loss and osteophytosis of the cervical spine, focally severe at L2-L3. Paraspinous soft tissues: Coronary artery calcifications. Gallstones. Aortic atherosclerosis. IMPRESSION: 1. No acute fracture or dislocation of the thoracic or lumbar spine. 2. Unchanged mild superior endplate wedge deformity of L1, with no greater than 20% anterior height loss. 3. Coronary artery disease. 4. Cholelithiasis. Aortic Atherosclerosis (ICD10-I70.0). Electronically Signed   By: Jearld Lesch M.D.   On: 05/24/2023 15:52   CT HEAD WO CONTRAST  Result Date: 05/24/2023 CLINICAL DATA:  Poly trauma, blunt EXAM: CT HEAD WITHOUT CONTRAST CT CERVICAL SPINE WITHOUT CONTRAST TECHNIQUE: Multidetector CT imaging of the head and cervical spine was performed following the standard protocol without intravenous contrast. Multiplanar CT image reconstructions of the cervical spine were also generated. RADIATION DOSE REDUCTION: This exam was  performed according to the departmental dose-optimization program which includes automated exposure control, adjustment of the mA and/or kV according to patient size and/or use of iterative reconstruction technique. COMPARISON:  None Available. FINDINGS: CT HEAD FINDINGS Brain: No evidence of acute infarction, hemorrhage, extra-axial collection, ventriculomegaly, or mass effect. Generalized cerebral atrophy. Periventricular white matter low attenuation likely secondary to microangiopathy. Vascular: Cerebrovascular atherosclerotic calcifications are noted. No hyperdense vessels. Skull: Negative for fracture or focal lesion. Sinuses/Orbits: Visualized portions of the orbits are unremarkable. Visualized portions of the paranasal sinuses are unremarkable. Visualized portions of the mastoid air cells are unremarkable. Other: None. CT CERVICAL SPINE FINDINGS Alignment: Normal. Skull base and vertebrae: No acute fracture. No primary bone lesion or focal pathologic process. Soft tissues and spinal canal: No prevertebral fluid or swelling. No visible canal hematoma. Disc levels: Degenerative disease with disc height loss C3-4. Mild broad-based disc osteophyte complex at C3-4 with bilateral uncovertebral degenerative changes and bilateral foraminal stenosis. Mild bilateral facet arthropathy at C3-4. At C4-5 there is a broad central disc protrusion and left foraminal stenosis. At C5-6 there is a mild broad-based disc bulge. At C6-7 there is a right foraminal  disc protrusion. Upper chest: Lung apices are clear. Other: No fluid collection or hematoma. Bilateral carotid artery atherosclerosis. IMPRESSION: 1. No acute intracranial pathology. 2. No acute osseous injury of the cervical spine. 3. Cervical spine spondylosis as described above. Electronically Signed   By: Elige Ko M.D.   On: 05/24/2023 15:48   CT CERVICAL SPINE WO CONTRAST  Result Date: 05/24/2023 CLINICAL DATA:  Poly trauma, blunt EXAM: CT HEAD WITHOUT  CONTRAST CT CERVICAL SPINE WITHOUT CONTRAST TECHNIQUE: Multidetector CT imaging of the head and cervical spine was performed following the standard protocol without intravenous contrast. Multiplanar CT image reconstructions of the cervical spine were also generated. RADIATION DOSE REDUCTION: This exam was performed according to the departmental dose-optimization program which includes automated exposure control, adjustment of the mA and/or kV according to patient size and/or use of iterative reconstruction technique. COMPARISON:  None Available. FINDINGS: CT HEAD FINDINGS Brain: No evidence of acute infarction, hemorrhage, extra-axial collection, ventriculomegaly, or mass effect. Generalized cerebral atrophy. Periventricular white matter low attenuation likely secondary to microangiopathy. Vascular: Cerebrovascular atherosclerotic calcifications are noted. No hyperdense vessels. Skull: Negative for fracture or focal lesion. Sinuses/Orbits: Visualized portions of the orbits are unremarkable. Visualized portions of the paranasal sinuses are unremarkable. Visualized portions of the mastoid air cells are unremarkable. Other: None. CT CERVICAL SPINE FINDINGS Alignment: Normal. Skull base and vertebrae: No acute fracture. No primary bone lesion or focal pathologic process. Soft tissues and spinal canal: No prevertebral fluid or swelling. No visible canal hematoma. Disc levels: Degenerative disease with disc height loss C3-4. Mild broad-based disc osteophyte complex at C3-4 with bilateral uncovertebral degenerative changes and bilateral foraminal stenosis. Mild bilateral facet arthropathy at C3-4. At C4-5 there is a broad central disc protrusion and left foraminal stenosis. At C5-6 there is a mild broad-based disc bulge. At C6-7 there is a right foraminal disc protrusion. Upper chest: Lung apices are clear. Other: No fluid collection or hematoma. Bilateral carotid artery atherosclerosis. IMPRESSION: 1. No acute  intracranial pathology. 2. No acute osseous injury of the cervical spine. 3. Cervical spine spondylosis as described above. Electronically Signed   By: Elige Ko M.D.   On: 05/24/2023 15:48    EKG: Independently reviewed.  Atrial fibrillation at 55 bpm.  Nonspecific T wave changes.  Assessment/Plan Principal Problem:   Hyponatremia Active Problems:   Hepatic cirrhosis (HCC)   Hypertension   Hypokalemia   History of stroke   Hypomagnesemia   Atrial fibrillation, chronic (HCC)   Spinal stenosis of lumbar region   History of alcohol abuse   Hyponatremia > Acute on chronic hyponatremia.  Recently admitted to [facility] for the same.  Complicated by history of cirrhosis. > He was discharged on 8/5 with stable sodium of 125 with recommendation for salt tablets, fluid restriction, spironolactone, Lasix.  He has been adherent to that per patient. > Presented after fall as below.  Found to have sodium 116.  Discussed with ICU but given patient was not having severe symptoms recommended admission to hospitalist service. > There is even 100 cc around 500 cc in the ED. > Consulted nephrology who recommended inpatient with cirrhosis starting with albumin and midodrine to improve renal perfusion and if sodium drops below 113 we will proceed with 3% saline infusion.  They will see the patient. - Monitor on progressive unit - Appreciate nephrology recommendations and assistance - Albumin 25%, 50 mg daily - Midodrine 10 mg 3 times daily - Trend BMP every 4 hours - Continue home salt tabs  Hypokalemia > Noted to have potassium 3.1 in the ED.  Received 40 mEq p.o. potassium.  Magnesium normal but did receive 1 g IV magnesium while in the ED. - Trend renal function and electrolytes as above  Fall Weakness > Ongoing weakness schedule point where his legs gave out today at [facility]. - PT/OT eval and treat  Hypertension - Continue home diltiazem, spironolactone, Lasix  Atrial fibrillation -  Continue home diltiazem, Eliquis  History of CVA - Continue home Eliquis  Cirrhosis > History of alcohol use.  Stable LFTs currently. - Continue home spironolactone, Lasix, getting albumin and midodrine as above  DVT prophylaxis: Eliquis Code Status:   Full Family Communication:  None on admission  Disposition Plan:   Patient is from:  Home  Anticipated DC to:  Home  Anticipated DC date:  2 to 5 days  Anticipated DC barriers: None  Consults called:  Nephrology Admission status:  Inpatient, progressive  Severity of Illness: The appropriate patient status for this patient is INPATIENT. Inpatient status is judged to be reasonable and necessary in order to provide the required intensity of service to ensure the patient's safety. The patient's presenting symptoms, physical exam findings, and initial radiographic and laboratory data in the context of their chronic comorbidities is felt to place them at high risk for further clinical deterioration. Furthermore, it is not anticipated that the patient will be medically stable for discharge from the hospital within 2 midnights of admission.   * I certify that at the point of admission it is my clinical judgment that the patient will require inpatient hospital care spanning beyond 2 midnights from the point of admission due to high intensity of service, high risk for further deterioration and high frequency of surveillance required.Synetta Fail MD [facility]  How to contact the [facility] Attending or Consulting provider 7A - 7P or covering provider during after hours 7P -7A, for this patient?   Check the care team in [facility] and look for a) attending/consulting [facility] provider listed and b) the [facility] team listed Log into [email or web address] and use [place]'s universal password to access. If you do not have the password, please contact the hospital operator. Locate the [facility] provider you are looking for under [facility] and page to a  number that you can be directly reached. If you still have difficulty reaching the provider, please page the [facility] (Director on Call) for the Hospitalists listed on amion for assistance.  05/24/2023, 6:31 PM

## 2023-05-24 NOTE — ED Notes (Addendum)
Md notified of critical sodium 116

## 2023-05-25 DIAGNOSIS — E871 Hypo-osmolality and hyponatremia: Secondary | ICD-10-CM | POA: Diagnosis not present

## 2023-05-25 LAB — BASIC METABOLIC PANEL
Anion gap: 11 (ref 5–15)
Anion gap: 11 (ref 5–15)
Anion gap: 9 (ref 5–15)
Anion gap: 11 (ref 5–15)
BUN: <5 mg/dL — ABNORMAL LOW (ref 8–23)
BUN: <5 mg/dL — ABNORMAL LOW (ref 8–23)
BUN: <5 mg/dL — ABNORMAL LOW (ref 8–23)
BUN: <5 mg/dL — ABNORMAL LOW (ref 8–23)
CO2: 21 mmol/L — ABNORMAL LOW (ref 22–32)
CO2: 24 mmol/L (ref 22–32)
CO2: 24 mmol/L (ref 22–32)
CO2: 24 mmol/L (ref 22–32)
Calcium: 9.1 mg/dL (ref 8.9–10.3)
Calcium: 9.2 mg/dL (ref 8.9–10.3)
Calcium: 9.3 mg/dL (ref 8.9–10.3)
Calcium: 9.3 mg/dL (ref 8.9–10.3)
Chloride: 88 mmol/L — ABNORMAL LOW (ref 98–111)
Chloride: 89 mmol/L — ABNORMAL LOW (ref 98–111)
Chloride: 89 mmol/L — ABNORMAL LOW (ref 98–111)
Chloride: 88 mmol/L — ABNORMAL LOW (ref 98–111)
Creatinine, Ser: 0.43 mg/dL — ABNORMAL LOW (ref 0.61–1.24)
Creatinine, Ser: 0.57 mg/dL — ABNORMAL LOW (ref 0.61–1.24)
Creatinine, Ser: 0.48 mg/dL — ABNORMAL LOW (ref 0.61–1.24)
Creatinine, Ser: 0.65 mg/dL (ref 0.61–1.24)
GFR, Estimated: >60 mL/min (ref >60)
GFR, Estimated: >60 mL/min (ref >60)
GFR, Estimated: >60 mL/min (ref >60)
GFR, Estimated: >60 mL/min (ref >60)
Glucose, Bld: 109 mg/dL — ABNORMAL HIGH (ref 70–99)
Glucose, Bld: 129 mg/dL — ABNORMAL HIGH (ref 70–99)
Glucose, Bld: 106 mg/dL — ABNORMAL HIGH (ref 70–99)
Glucose, Bld: 101 mg/dL — ABNORMAL HIGH (ref 70–99)
Potassium: 3.6 mmol/L (ref 3.5–5.1)
Potassium: 3.4 mmol/L — ABNORMAL LOW (ref 3.5–5.1)
Potassium: 3.8 mmol/L (ref 3.5–5.1)
Potassium: 3.6 mmol/L (ref 3.5–5.1)
Sodium: 120 mmol/L — ABNORMAL LOW (ref 135–145)
Sodium: 124 mmol/L — ABNORMAL LOW (ref 135–145)
Sodium: 122 mmol/L — ABNORMAL LOW (ref 135–145)
Sodium: 123 mmol/L — ABNORMAL LOW (ref 135–145)

## 2023-05-25 LAB — CBC
HCT: 34.2 % — ABNORMAL LOW (ref 39.0–52.0)
Hemoglobin: 11.4 g/dL — ABNORMAL LOW (ref 13.0–17.0)
MCH: 34.3 pg — ABNORMAL HIGH (ref 26.0–34.0)
MCHC: 33.3 g/dL (ref 30.0–36.0)
MCV: 103 fL — ABNORMAL HIGH (ref 80.0–100.0)
Platelets: 150 10*3/uL (ref 150–400)
RBC: 3.32 MIL/uL — ABNORMAL LOW (ref 4.22–5.81)
RDW: 17.9 % — ABNORMAL HIGH (ref 11.5–15.5)
WBC: 4.8 10*3/uL (ref 4.0–10.5)
nRBC: 0 % (ref 0.0–0.2)

## 2023-05-25 LAB — SODIUM, URINE, RANDOM: Sodium, Ur: <10 mmol/L

## 2023-05-25 LAB — ETHANOL: Alcohol, Ethyl (B): <10 mg/dL (ref <10)

## 2023-05-25 LAB — OSMOLALITY, URINE: Osmolality, Ur: 337 mOsm/kg (ref 300–900)

## 2023-05-25 LAB — CREATININE, URINE, RANDOM: Creatinine, Urine: 112 mg/dL

## 2023-05-25 MED ORDER — ALBUMIN HUMAN 25 % IV SOLN
50.0000 g | Freq: Two times a day (BID) | INTRAVENOUS | Status: DC
  Administered 2023-05-25 – 2023-05-27 (×6): 50 g via INTRAVENOUS
  Filled 2023-05-25: qty 200
  Filled 2023-05-25: qty 400
  Filled 2023-05-25 (×4): qty 200

## 2023-05-25 MED ORDER — SODIUM CHLORIDE 1 G PO TABS
2.0000 g | ORAL_TABLET | Freq: Three times a day (TID) | ORAL | Status: DC
  Administered 2023-05-25 – 2023-05-26 (×4): 2 g via ORAL
  Filled 2023-05-25 (×5): qty 2

## 2023-05-25 NOTE — ED Notes (Signed)
Pt says he would like to walk but he is scared he will fall again. RN informed providers will discuss next steps and address any concerns at that time.

## 2023-05-25 NOTE — ED Notes (Signed)
Provided pt beverage

## 2023-05-25 NOTE — Consult Note (Signed)
Renal Service Consult Note [facility]  Lacharles Dimitriou 05/25/2023 Maree Krabbe, MD Requesting Physician: Dr. Elvera Lennox  Reason for Consult: Hyponatremia   HPI: The patient is a 67 y.o. year-old w/ PMH as below who presented 8/17 yesterday after falling at the [facility]. Stated his legs "became weak" and he couldn't hold himself up. Pt was here recently in Aug for a few days w/ hyponatremia, norovirus infection, cirrhosis and diast CHF. Na+ levels were low rx'd w/ salt tabs and fluid restriction, also lasix/ aldactone.  Na+ was stable at around 125. Pt was dc'd.      ROS - denies CP, no joint pain, no HA, no blurry vision, no rash, no diarrhea, no nausea/ vomiting, no dysuria, no difficulty voiding   Past Medical History  Past Medical History:  Diagnosis Date   Atrial fibrillation with rapid ventricular response (HCC)    BPH (benign prostatic hyperplasia)    CVA (cerebral infarction)    Dysrhythmia    GERD (gastroesophageal reflux disease)    History of stroke 05/26/2019   HNP (herniated nucleus pulposus), lumbar 01/09/2015   Hypertension    Hypokalemia    Hypomagnesemia 05/26/2019   Stroke (HCC)    Left foot always feel 2 x larger, walks with a limp.   Past Surgical History  Past Surgical History:  Procedure Laterality Date   ESOPHAGOGASTRODUODENOSCOPY (EGD) WITH PROPOFOL N/A 01/06/2023   Procedure: ESOPHAGOGASTRODUODENOSCOPY (EGD) WITH PROPOFOL;  Surgeon: Wyline Mood, MD;  Location: [facility] ENDOSCOPY;  Service: Gastroenterology;  Laterality: N/A;  Wants close to 9 AM arrival   LUMBAR LAMINECTOMY/DECOMPRESSION MICRODISCECTOMY Right 01/09/2015   Procedure: LUMBAR LAMINECTOMY/DECOMPRESSION MICRODISCECTOMY 1 LEVEL;  Surgeon: Donalee Citrin, MD;  Location: [facility] NEURO ORS;  Service: Neurosurgery;  Laterality: Right;  LUMBAR LAMINECTOMY/DECOMPRESSION MICRODISCECTOMY 1 LEVEL RIGHT LUMBAR 2-3   TONSILLECTOMY     Family History  Family History  Problem Relation Age of Onset    Pancreatic cancer Mother    Social History  reports that he has quit smoking. His smoking use included cigarettes. He has never used smokeless tobacco. He reports that he does not currently use alcohol. He reports that he does not use drugs. Allergies No Known Allergies Home medications Prior to Admission medications   Medication Sig Start Date End Date Taking? Authorizing Provider  apixaban (ELIQUIS) 5 MG TABS tablet Take 1 tablet (5 mg total) by mouth 2 (two) times daily for 30 days. 12/22/18 07/19/23 Yes BeroElmer Sow, MD  diltiazem (CARDIZEM CD) 240 MG 24 hr capsule Take 1 capsule (240 mg total) by mouth daily. 12/01/20  Yes Fenton, Clint R, PA  DULoxetine (CYMBALTA) 30 MG capsule Take 30 mg by mouth daily.   Yes [provider]  lactose free nutrition (BOOST) LIQD Take 1 Container by mouth 2 (two) times daily.   Yes [provider]  omeprazole (PRILOSEC) 40 MG capsule Take 40 mg by mouth daily as needed (heartburn).   Yes [provider]  pregabalin (LYRICA) 200 MG capsule Take 200-600 mg by mouth daily. 02/21/23  Yes [provider]  rOPINIRole (REQUIP) 0.5 MG tablet Take 0.5 mg by mouth daily as needed (restless legs).   Yes [provider]  spironolactone (ALDACTONE) 25 MG tablet Take 1 tablet (25 mg total) by mouth daily. 05/13/23  Yes Darlin Priestly, MD  furosemide (LASIX) 40 MG tablet Take 1 tablet (40 mg total) by mouth daily. Patient not taking: Reported on 05/24/2023 05/13/23   Darlin Priestly, MD  sodium bicarbonate  325 MG tablet Take 1 tablet (325 mg total) by mouth 2 (two) times daily. Patient not taking: Reported on 05/24/2023 05/12/23   Darlin Priestly, MD  sodium chloride 1 g tablet Take 1 tablet (1 g total) by mouth 3 (three) times daily with meals. Patient not taking: Reported on 05/24/2023 05/12/23   Darlin Priestly, MD     Vitals:   05/25/23 1300 05/25/23 1330 05/25/23 1343 05/25/23 1500  BP: 129/64 (!) 125/54  (!) 112/51  Pulse: 63 62  (!) 54  Resp: 16  12  16   Temp:   98 F (36.7 C) 97.9 F (36.6 C)  TempSrc:   Oral Oral  SpO2: 99% 100%  98%  Weight:      Height:       Exam Gen alert, no distress No rash, cyanosis or gangrene Sclera anicteric, throat clear  No jvd or bruits Chest clear bilat to bases, no rales/ wheezing RRR no MRG Abd soft ntnd no mass or ascites +bs GU normal male MS no joint effusions or deformity Ext no LE or UE edema, no wounds or ulcers Neuro is alert, Ox 3 , nf       Home meds include - cymbalta, boost, eliquis, cardizem cd 240 every day, prilosec, lyrica, ropinirole, spironolactone 25 every day, furosemide 40 every day, sod bicarb, sod chloride 1 gm tid      UA 8/17 - negative     UNa pend, UCr pend      UOsm pend      Serum Osm 255 (low)      CXR - no active disease      Na 124  K 3.4  CO2 24  BUN 5  creat 0.57   Ca 9.2  LFT's pend     WBC 4K  HB 11        8/4 labs --> UNa 57, UOsm 508       8/3 labs --> UNa 98, UOsm 225       8/2 labs --> UNa 107, UOsm 358    Assessment/ Plan: Hyponatremia - Na+ 118 here in the setting of hx of cirrhosis, also atrial fib w/ RVR, hx CVA.  Has had 1 paracentesis of 900 cc. No hx severe ascites, or other cirrhosis complications. He was just here 2 weeks ago and urine lytes at that time showed normal UNa and high UOsm. Not sure if this is cirrhosis driven hyponatremia, vs SIADH vs other.  Doesn't look like low solute (+etoh abuse) because the urine solute (Na+) and UOsm aren't low. Treatment for cirrhotic hyponatremia would be IV albumin +/- midodrine for soft BP's. SIADH rx would include salt tabs.  Seems to be responding to all 3, will continue for now.  Cirrhosis - has had paracentesis once in the recent past, diagnostic more than therapeutic. +portal HTN per results. He has no vol excess/ edema at this time, so have put a hold on the po lasix/ aldactone.  H/o CVA  Atrial fib - on cardizem and eliquis H/o etoh abuse.       Vinson Moselle  MD [facility] 05/25/2023, 3:09  PM  Recent Labs  Lab 05/24/23 1445 05/24/23 1456 05/24/23 1900 05/25/23 0242 05/25/23 0952  HGB 11.4* 12.6*  --  11.4*  --   ALBUMIN 3.8  --   --   --   --   CALCIUM 8.6*  --    < > 9.1 9.2  CREATININE 0.59* 0.40*   < > 0.43*  0.57*  K 3.1* 3.1*   < > 3.6 3.4*   < > = values in this interval not displayed.   Inpatient medications:  apixaban  5 mg Oral BID   diltiazem  240 mg Oral Daily   midodrine  10 mg Oral TID WC   pantoprazole  40 mg Oral Daily   pregabalin  200 mg Oral TID   rOPINIRole  0.5 mg Oral QHS   sodium bicarbonate  325 mg Oral BID   sodium chloride flush  3 mL Intravenous Q12H   sodium chloride  2 g Oral TID WC    albumin human 50 g (05/25/23 1045)   acetaminophen **OR** acetaminophen, polyethylene glycol

## 2023-05-25 NOTE — Evaluation (Addendum)
Occupational Therapy Evaluation Patient Details Name: Jeffrey Randolph MRN: [id number] DOB: August 12, 1956 Today's Date: 05/25/2023   History of Present Illness Pt is a 67 y.o. male admitted 05/24/23 after feeling weak and falling at [facility]; pt hit his head, denies LOC. Workup for hyponatremia, hypokalemia. Of note, recent admission 05/08/23 with similar issues. Other PMH includes afib, HTN, CVA, hepatic cirrhosis.   Clinical Impression   PTA patient independent. Admitted for above and presents with problem list below.  He demonstrates ability to complete ADLs with min guard to supervision assist, transfers and functional mobility with min guard.  Mild instability with LOB towards R initially but no physical assistance required.  He does report frequent falls at home.  Cognitively, pt with some slow processing and voices 2023 initially (but able to self correct). Do recommend 3:1 commode as pt does not have a bathroom in the cabin where he stays. Based on performance today, believe he will benefit from continued OT services acutely and after dc at [facility] level to optimize independence, safety and to decreased risk of falls.  Will follow.       If plan is discharge home, recommend the following: A little help with walking and/or transfers;A little help with bathing/dressing/bathroom;Assistance with cooking/housework;Assist for transportation;Help with stairs or ramp for entrance    Functional Status Assessment  Patient has had a recent decline in their functional status and demonstrates the ability to make significant improvements in function in a reasonable and predictable amount of time.  Equipment Recommendations  BSC/3in1    Recommendations for Other Services       Precautions / Restrictions Precautions Precautions: Fall Restrictions Weight Bearing Restrictions: No      Mobility Bed Mobility               General bed mobility comments: sitting on edge of stretcher upon entry     Transfers Overall transfer level: Needs assistance Equipment used: None Transfers: Sit to/from Stand Sit to Stand: Contact guard assist           General transfer comment: for safety      Balance Overall balance assessment: Mild deficits observed, not formally tested                                         ADL either performed or assessed with clinical judgement   ADL Overall ADL's : Needs assistance/impaired     Grooming: Contact guard assist;Standing           Upper Body Dressing : Set up;Sitting   Lower Body Dressing: Contact guard assist;Sit to/from stand   Toilet Transfer: Contact guard assist;Ambulation           Functional mobility during ADLs: Contact guard assist General ADL Comments: pt with several LOB to R with inital mobility but self corrects without assist     Vision Patient Visual Report: No change from baseline Vision Assessment?: Wears glasses for reading Additional Comments: pt reports increased blurriness for a "little while"     Perception         Praxis         Pertinent Vitals/Pain Pain Assessment Pain Assessment: No/denies pain     Extremity/Trunk Assessment Upper Extremity Assessment Upper Extremity Assessment: Right hand dominant;Overall [facility] for tasks assessed   Lower Extremity Assessment Lower Extremity Assessment: Defer to PT evaluation   Cervical / Trunk Assessment Cervical /  Trunk Assessment: Normal   Communication Communication Communication: No apparent difficulties   Cognition Arousal: Alert Behavior During Therapy: WFL for tasks assessed/performed Overall Cognitive Status: Impaired/Different from baseline Area of Impairment: Orientation, Problem solving                 Orientation Level: Disoriented to, Time           Problem Solving: Slow processing, Requires verbal cues General Comments: initally reports 2023, but self correct when cued.  He demonstrates some slow  processing.  would benefit from higher level cognitive testing     General Comments  on RA, VSS (NT completed orthostatics before entry- stable)    Exercises     Shoulder Instructions      Home Living Family/patient expects to be discharged to:: Private residence Living Arrangements: Alone Available Help at Discharge: Friend(s);Available PRN/intermittently Type of Home: House ("cabin" behind his friend) Home Access: Level entry     Home Layout: One level     Bathroom Shower/Tub: Sponge bathes at baseline;Tub/shower unit   Bathroom Toilet: Standard (uses an outhouse)     Home Equipment: None   Additional Comments: either sponge bathes or uses friends shower in her tub/shower      Prior Functioning/Environment Prior Level of Function : Independent/Modified Independent;History of Falls (last six months)             Mobility Comments: no AD, 5-6 falls in the last 2-3 months ADLs Comments: drives scooter, independent ADLs, IADLs        OT Problem List: Decreased activity tolerance;Impaired balance (sitting and/or standing);Decreased safety awareness;Decreased knowledge of use of DME or AE;Decreased knowledge of precautions      OT Treatment/Interventions: Therapeutic exercise;Self-care/ADL training;Energy conservation;DME and/or AE instruction;Therapeutic activities;Patient/family education;Balance training    OT Goals(Current goals can be found in the care plan section) Acute Rehab OT Goals Patient Stated Goal: get better OT Goal Formulation: With patient Time For Goal Achievement: 06/08/23 Potential to Achieve Goals: Good  OT Frequency: Min 1X/week    Co-evaluation              AM-PAC OT "6 Clicks" Daily Activity     Outcome Measure Help from another person eating meals?: None Help from another person taking care of personal grooming?: A Little Help from another person toileting, which includes using toliet, bedpan, or urinal?: A Little Help from  another person bathing (including washing, rinsing, drying)?: A Little Help from another person to put on and taking off regular upper body clothing?: A Little Help from another person to put on and taking off regular lower body clothing?: A Little 6 Click Score: 19   End of Session Equipment Utilized During Treatment: Gait belt Nurse Communication: Mobility status  Activity Tolerance: Patient tolerated treatment well Patient left: with call bell/phone within reach;Other (comment) (with PT)  OT Visit Diagnosis: Unsteadiness on feet (R26.81)                Time: 7253-6644 OT Time Calculation (min): 17 min Charges:  OT General Charges $OT Visit: 1 Visit OT Evaluation $OT Eval Low Complexity: 1 Low  Jeffrey Randolph, OT Acute Rehabilitation Services Office [phone]   Jeffrey Randolph 05/25/2023, 9:20 AM

## 2023-05-25 NOTE — ED Notes (Signed)
Pt says he would like a print off list for diet recommendations at home.

## 2023-05-25 NOTE — ED Notes (Signed)
ED TO INPATIENT HANDOFF REPORT  ED Nurse Name and Phone #: Jess Barters [phone]  S Name/Age/Gender Jeffrey Randolph 67 y.o. male Room/Bed: 008C/008C  Code Status   Code Status: Full Code  Home/SNF/Other Home Patient oriented to: self, place, time, and situation Is this baseline? Yes   Triage Complete: Triage complete  Chief Complaint Hyponatremia [E87.1]  Triage Note Pt arrives via ems to the ed for the c/o fall at [employer] today. Pt states his legs became weak and was not able to hold himself up.  Ems states bp was 108/56 on arrival. Pt hit back of his head, no bleeding. Pt is on elquis for afib. Hr stable per ems. NS given by ems   Allergies No Known Allergies  Level of Care/Admitting Diagnosis ED Disposition     ED Disposition  Admit   Condition  --   Comment  Hospital Area: [facility] [[id number]]  Level of Care: Progressive [102]  Admit to Progressive based on following criteria: NEPHROLOGY stable condition requiring close monitoring for AKI, requiring Hemodialysis or Peritoneal Dialysis either from expected electrolyte imbalance, acidosis, or fluid overload that can be managed by NIPPV or high flow oxygen.  May admit patient to [facility] or [facility] if equivalent level of care is available:: No  Covid Evaluation: Asymptomatic - no recent exposure (last 10 days) testing not required  Diagnosis: Hyponatremia [198519]  Admitting Physician: Synetta Fail [[id number]]  Attending Physician: Synetta Fail [[id number]]  Certification:: I certify this patient will need inpatient services for at least 2 midnights  Expected Medical Readiness: 05/27/2023          B Medical/Surgery History Past Medical History:  Diagnosis Date   Atrial fibrillation with rapid ventricular response (HCC)    BPH (benign prostatic hyperplasia)    CVA (cerebral infarction)    Dysrhythmia    GERD (gastroesophageal reflux disease)    History of stroke  05/26/2019   HNP (herniated nucleus pulposus), lumbar 01/09/2015   Hypertension    Hypokalemia    Hypomagnesemia 05/26/2019   Stroke (HCC)    Left foot always feel 2 x larger, walks with a limp.   Past Surgical History:  Procedure Laterality Date   ESOPHAGOGASTRODUODENOSCOPY (EGD) WITH PROPOFOL N/A 01/06/2023   Procedure: ESOPHAGOGASTRODUODENOSCOPY (EGD) WITH PROPOFOL;  Surgeon: Wyline Mood, MD;  Location: [facility] ENDOSCOPY;  Service: Gastroenterology;  Laterality: N/A;  Wants close to 9 AM arrival   LUMBAR LAMINECTOMY/DECOMPRESSION MICRODISCECTOMY Right 01/09/2015   Procedure: LUMBAR LAMINECTOMY/DECOMPRESSION MICRODISCECTOMY 1 LEVEL;  Surgeon: Donalee Citrin, MD;  Location: [facility] NEURO ORS;  Service: Neurosurgery;  Laterality: Right;  LUMBAR LAMINECTOMY/DECOMPRESSION MICRODISCECTOMY 1 LEVEL RIGHT LUMBAR 2-3   TONSILLECTOMY       A IV Location/Drains/Wounds Patient Lines/Drains/Airways Status     Active Line/Drains/Airways     Name Placement date Placement time Site Days   Peripheral IV 05/24/23 20 G Left;Posterior Forearm 05/24/23  --  Forearm  1   Peripheral IV 05/24/23 20 G Right Antecubital 05/24/23  1911  Antecubital  1   Wound / Incision (Open or Dehisced) 03/20/22 Puncture Abdomen Lower;Right paracentesis site 03/20/22  1539  Abdomen  431   Wound / Incision (Open or Dehisced) 05/08/23 Other (Comment) Sternum Upper;Mid Black, raised, irregular 05/08/23  1530  Sternum  17            Intake/Output Last 24 hours  Intake/Output Summary (Last 24 hours) at 05/25/2023 1325 Last data filed at 05/25/2023 0800 Gross per 24  hour  Intake 600 ml  Output 400 ml  Net 200 ml    Labs/Imaging Results for orders placed or performed during the hospital encounter of 05/24/23 (from the past 48 hour(s))  Comprehensive metabolic panel     Status: Abnormal   Collection Time: 05/24/23  2:45 PM  Result Value Ref Range   Sodium 116 (LL) 135 - 145 mmol/L    Comment: CRITICAL RESULT CALLED TO, READ BACK BY  AND VERIFIED WITH GABE MILLER RN AT [phone] BY D LONG   Potassium 3.1 (L) 3.5 - 5.1 mmol/L   Chloride 84 (L) 98 - 111 mmol/L   CO2 19 (L) 22 - 32 mmol/L   Glucose, Bld 126 (H) 70 - 99 mg/dL    Comment: Glucose reference range applies only to samples taken after fasting for at least 8 hours.   BUN <5 (L) 8 - 23 mg/dL   Creatinine, Ser 4.25 (L) 0.61 - 1.24 mg/dL   Calcium 8.6 (L) 8.9 - 10.3 mg/dL   Total Protein 6.8 6.5 - 8.1 g/dL   Albumin 3.8 3.5 - 5.0 g/dL   AST 40 15 - 41 U/L   ALT 23 0 - 44 U/L   Alkaline Phosphatase 74 38 - 126 U/L   Total Bilirubin 2.2 (H) 0.3 - 1.2 mg/dL   GFR, Estimated >95 >63 mL/min    Comment: (NOTE) Calculated using the CKD-EPI Creatinine Equation (2021)    Anion gap 13 5 - 15    Comment: Performed at [facility], [address] [place], [place] [address]  CBC     Status: Abnormal   Collection Time: 05/24/23  2:45 PM  Result Value Ref Range   WBC 5.4 4.0 - 10.5 K/uL   RBC 3.30 (L) 4.22 - 5.81 MIL/uL   Hemoglobin 11.4 (L) 13.0 - 17.0 g/dL   HCT 33.2 (L) 95.1 - 88.4 %   MCV 97.3 80.0 - 100.0 fL   MCH 34.5 (H) 26.0 - 34.0 pg   MCHC 35.5 30.0 - 36.0 g/dL   RDW 16.6 (H) 06.3 - 01.6 %   Platelets 194 150 - 400 K/uL   nRBC 0.0 0.0 - 0.2 %    Comment: Performed at [facility], [address] [place], [place] [address]  Protime-INR     Status: Abnormal   Collection Time: 05/24/23  2:45 PM  Result Value Ref Range   Prothrombin Time 21.0 (H) 11.4 - 15.2 seconds   INR 1.8 (H) 0.8 - 1.2    Comment: (NOTE) INR goal varies based on device and disease states. Performed at [facility], [address], [place], [place] [address]   Magnesium     Status: None   Collection Time: 05/24/23  2:45 PM  Result Value Ref Range   Magnesium 1.8 1.7 - 2.4 mg/dL    Comment: Performed at [facility], [address], [place], [place] [address]  I-Stat Chem 8, ED     Status: Abnormal   Collection Time: 05/24/23  2:56 PM  Result Value Ref  Range   Sodium 118 (LL) 135 - 145 mmol/L   Potassium 3.1 (L) 3.5 - 5.1 mmol/L   Chloride 84 (L) 98 - 111 mmol/L   BUN <3 (L) 8 - 23 mg/dL   Creatinine, Ser 5.42 (L) 0.61 - 1.24 mg/dL   Glucose, Bld 706 (H) 70 - 99 mg/dL    Comment: Glucose reference range applies only to samples taken  after fasting for at least 8 hours.   Calcium, Ion 1.04 (L) 1.15 - 1.40 mmol/L   TCO2 22 22 - 32 mmol/L   Hemoglobin 12.6 (L) 13.0 - 17.0 g/dL   HCT 86.5 (L) 78.4 - 69.6 %   Comment NOTIFIED PHYSICIAN   I-Stat Lactic Acid, ED     Status: Abnormal   Collection Time: 05/24/23  2:56 PM  Result Value Ref Range   Lactic Acid, Venous 2.4 (HH) 0.5 - 1.9 mmol/L   Comment NOTIFIED PHYSICIAN   Urinalysis, Routine w reflex microscopic -Urine, Clean Catch     Status: Abnormal   Collection Time: 05/24/23  4:21 PM  Result Value Ref Range   Color, Urine YELLOW YELLOW   APPearance CLEAR CLEAR   Specific Gravity, Urine 1.024 1.005 - 1.030   pH 7.0 5.0 - 8.0   Glucose, UA NEGATIVE NEGATIVE mg/dL   Hgb urine dipstick SMALL (A) NEGATIVE   Bilirubin Urine NEGATIVE NEGATIVE   Ketones, ur NEGATIVE NEGATIVE mg/dL   Protein, ur NEGATIVE NEGATIVE mg/dL   Nitrite NEGATIVE NEGATIVE   Leukocytes,Ua NEGATIVE NEGATIVE   RBC / HPF 0-5 0 - 5 RBC/hpf   WBC, UA 0-5 0 - 5 WBC/hpf   Bacteria, UA NONE SEEN NONE SEEN   Squamous Epithelial / HPF 0-5 0 - 5 /HPF    Comment: Performed at [facility], [address] [place], [place] [address]  Basic metabolic panel     Status: Abnormal   Collection Time: 05/24/23  7:00 PM  Result Value Ref Range   Sodium 119 (LL) 135 - 145 mmol/L    Comment: CRITICAL RESULT CALLED TO, READ BACK BY AND VERIFIED WITH S,WARD RN @1946  05/24/23 E,[place]   Potassium 3.0 (L) 3.5 - 5.1 mmol/L   Chloride 85 (L) 98 - 111 mmol/L   CO2 23 22 - 32 mmol/L   Glucose, Bld 104 (H) 70 - 99 mg/dL    Comment: Glucose reference range applies only to samples taken after fasting for at least 8 hours.   BUN <5 (L) 8  - 23 mg/dL   Creatinine, Ser 4.13 (L) 0.61 - 1.24 mg/dL   Calcium 8.9 8.9 - 24.4 mg/dL   GFR, Estimated >01 >02 mL/min    Comment: (NOTE) Calculated using the CKD-EPI Creatinine Equation (2021)    Anion gap 11 5 - 15    Comment: Performed at [facility], [address] [place], [place] [address]  Basic metabolic panel     Status: Abnormal   Collection Time: 05/24/23 10:13 PM  Result Value Ref Range   Sodium 120 (L) 135 - 145 mmol/L   Potassium 3.5 3.5 - 5.1 mmol/L   Chloride 88 (L) 98 - 111 mmol/L   CO2 22 22 - 32 mmol/L   Glucose, Bld 100 (H) 70 - 99 mg/dL    Comment: Glucose reference range applies only to samples taken after fasting for at least 8 hours.   BUN <5 (L) 8 - 23 mg/dL   Creatinine, Ser 6.44 (L) 0.61 - 1.24 mg/dL   Calcium 8.6 (L) 8.9 - 10.3 mg/dL   GFR, Estimated >03 >47 mL/min    Comment: (NOTE) Calculated using the CKD-EPI Creatinine Equation (2021)    Anion gap 10 5 - 15    Comment: Performed at [facility], [address] [place], [place] [address]  Osmolality     Status: Abnormal   Collection Time: 05/24/23 10:13 PM  Result Value  Ref Range   Osmolality 255 (L) 275 - 295 mOsm/kg    Comment: Performed at [facility], [address], [place], [place] [address]  Ethanol     Status: None   Collection Time: 05/25/23  2:42 AM  Result Value Ref Range   Alcohol, Ethyl (B) <10 <10 mg/dL    Comment: (NOTE) Lowest detectable limit for serum alcohol is 10 mg/dL.  For medical purposes only. Performed at [facility], [address], [place], [place] [address]   Basic metabolic panel     Status: Abnormal   Collection Time: 05/25/23  2:42 AM  Result Value Ref Range   Sodium 120 (L) 135 - 145 mmol/L   Potassium 3.6 3.5 - 5.1 mmol/L   Chloride 88 (L) 98 - 111 mmol/L   CO2 21 (L) 22 - 32 mmol/L   Glucose, Bld 109 (H) 70 - 99 mg/dL    Comment: Glucose reference range applies only to samples taken after fasting for at least 8 hours.    BUN <5 (L) 8 - 23 mg/dL   Creatinine, Ser 4.03 (L) 0.61 - 1.24 mg/dL   Calcium 9.1 8.9 - 47.4 mg/dL   GFR, Estimated >25 >95 mL/min    Comment: (NOTE) Calculated using the CKD-EPI Creatinine Equation (2021)    Anion gap 11 5 - 15    Comment: Performed at [facility], [address] [place], [place] [address]  CBC     Status: Abnormal   Collection Time: 05/25/23  2:42 AM  Result Value Ref Range   WBC 4.8 4.0 - 10.5 K/uL   RBC 3.32 (L) 4.22 - 5.81 MIL/uL   Hemoglobin 11.4 (L) 13.0 - 17.0 g/dL   HCT 64.3 (L) 32.9 - 51.8 %   MCV 103.0 (H) 80.0 - 100.0 fL   MCH 34.3 (H) 26.0 - 34.0 pg   MCHC 33.3 30.0 - 36.0 g/dL   RDW 84.1 (H) 66.0 - 63.0 %   Platelets 150 150 - 400 K/uL   nRBC 0.0 0.0 - 0.2 %    Comment: Performed at [facility], [address] [place], [place] [address]  Basic metabolic panel     Status: Abnormal   Collection Time: 05/25/23  9:52 AM  Result Value Ref Range   Sodium 124 (L) 135 - 145 mmol/L   Potassium 3.4 (L) 3.5 - 5.1 mmol/L   Chloride 89 (L) 98 - 111 mmol/L   CO2 24 22 - 32 mmol/L   Glucose, Bld 129 (H) 70 - 99 mg/dL    Comment: Glucose reference range applies only to samples taken after fasting for at least 8 hours.   BUN <5 (L) 8 - 23 mg/dL   Creatinine, Ser 9.32 (L) 0.61 - 1.24 mg/dL   Calcium 9.2 8.9 - 35.5 mg/dL   GFR, Estimated >73 >22 mL/min    Comment: (NOTE) Calculated using the CKD-EPI Creatinine Equation (2021)    Anion gap 11 5 - 15    Comment: Performed at [facility], [address], [place], [place] [address]   DG Pelvis Portable  Result Date: 05/24/2023 CLINICAL DATA:  Fall. EXAM: PORTABLE PELVIS 1-2 VIEWS COMPARISON:  None Available. FINDINGS: Minimal osteoarthritic change of the hips. No acute fracture or dislocation. Contrast within the bladder. Degenerative change of the spine. IMPRESSION: 1. No acute findings. 2. Minimal osteoarthritic change of the hips. Electronically Signed   By: Elberta Fortis M.D.   On:  05/24/2023 16:00   DG Chest Port 1 View  Result Date: 05/24/2023 CLINICAL DATA:  Fall. EXAM: PORTABLE CHEST 1 VIEW COMPARISON:  03/20/2022 FINDINGS: Lungs are adequately inflated with mild linear density over the right midlung likely atelectasis. Subtle no pleural effusion or pneumothorax. Cardiomediastinal silhouette is normal. No evidence of fracture. IMPRESSION: Mild linear density over the right midlung likely atelectasis. Electronically Signed   By: Elberta Fortis M.D.   On: 05/24/2023 15:59   CT CHEST ABDOMEN PELVIS W CONTRAST  Result Date: 05/24/2023 CLINICAL DATA:  Polytrauma, blunt, fall at restaurant today with head injury, anticoagulated for atrial fibrillation EXAM: CT CHEST, ABDOMEN, AND PELVIS WITH CONTRAST TECHNIQUE: Multidetector CT imaging of the chest, abdomen and pelvis was performed following the standard protocol during bolus administration of intravenous contrast. RADIATION DOSE REDUCTION: This exam was performed according to the departmental dose-optimization program which includes automated exposure control, adjustment of the mA and/or kV according to patient size and/or use of iterative reconstruction technique. CONTRAST:  75mL OMNIPAQUE IOHEXOL 350 MG/ML SOLN COMPARISON:  03/20/2022 CT chest, abdomen and pelvis FINDINGS: CT CHEST FINDINGS Cardiovascular: Normal heart size. No significant pericardial fluid/thickening. Left anterior descending coronary atherosclerosis. Atherosclerotic nonaneurysmal thoracic aorta. Normal caliber pulmonary arteries. No evidence of acute thoracic aortic injury. No central pulmonary emboli. Mediastinum/Nodes: No pneumomediastinum. No mediastinal hematoma. No discrete thyroid nodules. Unremarkable esophagus. No axillary, mediastinal or hilar lymphadenopathy. Lungs/Pleura: No pneumothorax. No pleural effusion. Solid 3 mm right LL pulmonary nodule (series 5/image 105) is stable and considered benign. No acute consolidative airspace disease, lung masses or  additional significant pulmonary nodules. Musculoskeletal: No aggressive appearing focal osseous lesions. No acute fracture in the chest. Healed chronic lateral right ninth rib fracture. Mild thoracic spondylosis. Symmetric mild gynecomastia, similar. CT ABDOMEN PELVIS FINDINGS Hepatobiliary: Normal liver with no liver laceration or mass. Cholelithiasis. No biliary ductal dilatation. Pancreas: Normal, with no laceration, mass or duct dilation. Spleen: Normal size. No laceration or mass. Adrenals/Urinary Tract: Normal adrenals. No hydronephrosis. No renal laceration. No renal mass. Normal bladder. Stomach/Bowel: Grossly normal stomach. Normal caliber small bowel with no small bowel wall thickening. Appendix not discretely visualized. Normal large bowel with no diverticulosis, large bowel wall thickening or pericolonic fat stranding. Vascular/Lymphatic: Atherosclerotic nonaneurysmal abdominal aorta. Patent renal veins. No pathologically enlarged lymph nodes in the abdomen or pelvis. Reproductive: Normal size prostate. Other: No pneumoperitoneum, ascites or focal fluid collection. Musculoskeletal: No aggressive appearing focal osseous lesions. No acute fracture. Marked multilevel lumbar degenerative disc disease. Mild superior L1 vertebral compression deformity is chronic and unchanged. IMPRESSION: 1. No evidence of acute traumatic injury in the chest, abdomen or pelvis. 2. Cholelithiasis. 3. One vessel coronary atherosclerosis. 4. Symmetric mild gynecomastia, similar. Electronically Signed   By: Delbert Phenix M.D.   On: 05/24/2023 15:54   CT T-SPINE NO CHARGE  Result Date: 05/24/2023 CLINICAL DATA:  Trauma, back pain EXAM: CT THORACIC AND LUMBAR SPINE WITH CONTRAST TECHNIQUE: Multiplanar CT images of the thoracic and lumbar spine were reconstructed from contemporary CT of the Chest, Abdomen, and Pelvis. RADIATION DOSE REDUCTION: This exam was performed according to the departmental dose-optimization program which  includes automated exposure control, adjustment of the mA and/or kV according to patient size and/or use of iterative reconstruction technique. CONTRAST:  None or No additional COMPARISON:  03/20/2022 FINDINGS: CT THORACIC AND LUMBAR SPINE FINDINGS Alignment: Normal thoracic kyphosis. Normal lumbar lordosis. Vertebral bodies: Unchanged mild superior endplate wedge deformity of L1, with no greater than 20% anterior height loss. Vertebral bodies  otherwise intact. No other fracture or dislocation. Disc spaces: Generally mild disc space height loss and osteophytosis of the cervical spine, focally severe at L2-L3. Paraspinous soft tissues: Coronary artery calcifications. Gallstones. Aortic atherosclerosis. IMPRESSION: 1. No acute fracture or dislocation of the thoracic or lumbar spine. 2. Unchanged mild superior endplate wedge deformity of L1, with no greater than 20% anterior height loss. 3. Coronary artery disease. 4. Cholelithiasis. Aortic Atherosclerosis (ICD10-I70.0). Electronically Signed   By: Jearld Lesch M.D.   On: 05/24/2023 15:52   CT L-SPINE NO CHARGE  Result Date: 05/24/2023 CLINICAL DATA:  Trauma, back pain EXAM: CT THORACIC AND LUMBAR SPINE WITH CONTRAST TECHNIQUE: Multiplanar CT images of the thoracic and lumbar spine were reconstructed from contemporary CT of the Chest, Abdomen, and Pelvis. RADIATION DOSE REDUCTION: This exam was performed according to the departmental dose-optimization program which includes automated exposure control, adjustment of the mA and/or kV according to patient size and/or use of iterative reconstruction technique. CONTRAST:  None or No additional COMPARISON:  03/20/2022 FINDINGS: CT THORACIC AND LUMBAR SPINE FINDINGS Alignment: Normal thoracic kyphosis. Normal lumbar lordosis. Vertebral bodies: Unchanged mild superior endplate wedge deformity of L1, with no greater than 20% anterior height loss. Vertebral bodies otherwise intact. No other fracture or dislocation. Disc  spaces: Generally mild disc space height loss and osteophytosis of the cervical spine, focally severe at L2-L3. Paraspinous soft tissues: Coronary artery calcifications. Gallstones. Aortic atherosclerosis. IMPRESSION: 1. No acute fracture or dislocation of the thoracic or lumbar spine. 2. Unchanged mild superior endplate wedge deformity of L1, with no greater than 20% anterior height loss. 3. Coronary artery disease. 4. Cholelithiasis. Aortic Atherosclerosis (ICD10-I70.0). Electronically Signed   By: Jearld Lesch M.D.   On: 05/24/2023 15:52   CT HEAD WO CONTRAST  Result Date: 05/24/2023 CLINICAL DATA:  Poly trauma, blunt EXAM: CT HEAD WITHOUT CONTRAST CT CERVICAL SPINE WITHOUT CONTRAST TECHNIQUE: Multidetector CT imaging of the head and cervical spine was performed following the standard protocol without intravenous contrast. Multiplanar CT image reconstructions of the cervical spine were also generated. RADIATION DOSE REDUCTION: This exam was performed according to the departmental dose-optimization program which includes automated exposure control, adjustment of the mA and/or kV according to patient size and/or use of iterative reconstruction technique. COMPARISON:  None Available. FINDINGS: CT HEAD FINDINGS Brain: No evidence of acute infarction, hemorrhage, extra-axial collection, ventriculomegaly, or mass effect. Generalized cerebral atrophy. Periventricular white matter low attenuation likely secondary to microangiopathy. Vascular: Cerebrovascular atherosclerotic calcifications are noted. No hyperdense vessels. Skull: Negative for fracture or focal lesion. Sinuses/Orbits: Visualized portions of the orbits are unremarkable. Visualized portions of the paranasal sinuses are unremarkable. Visualized portions of the mastoid air cells are unremarkable. Other: None. CT CERVICAL SPINE FINDINGS Alignment: Normal. Skull base and vertebrae: No acute fracture. No primary bone lesion or focal pathologic process. Soft  tissues and spinal canal: No prevertebral fluid or swelling. No visible canal hematoma. Disc levels: Degenerative disease with disc height loss C3-4. Mild broad-based disc osteophyte complex at C3-4 with bilateral uncovertebral degenerative changes and bilateral foraminal stenosis. Mild bilateral facet arthropathy at C3-4. At C4-5 there is a broad central disc protrusion and left foraminal stenosis. At C5-6 there is a mild broad-based disc bulge. At C6-7 there is a right foraminal disc protrusion. Upper chest: Lung apices are clear. Other: No fluid collection or hematoma. Bilateral carotid artery atherosclerosis. IMPRESSION: 1. No acute intracranial pathology. 2. No acute osseous injury of the cervical spine. 3. Cervical spine spondylosis as described above. Electronically  Signed   By: Elige Ko M.D.   On: 05/24/2023 15:48   CT CERVICAL SPINE WO CONTRAST  Result Date: 05/24/2023 CLINICAL DATA:  Poly trauma, blunt EXAM: CT HEAD WITHOUT CONTRAST CT CERVICAL SPINE WITHOUT CONTRAST TECHNIQUE: Multidetector CT imaging of the head and cervical spine was performed following the standard protocol without intravenous contrast. Multiplanar CT image reconstructions of the cervical spine were also generated. RADIATION DOSE REDUCTION: This exam was performed according to the departmental dose-optimization program which includes automated exposure control, adjustment of the mA and/or kV according to patient size and/or use of iterative reconstruction technique. COMPARISON:  None Available. FINDINGS: CT HEAD FINDINGS Brain: No evidence of acute infarction, hemorrhage, extra-axial collection, ventriculomegaly, or mass effect. Generalized cerebral atrophy. Periventricular white matter low attenuation likely secondary to microangiopathy. Vascular: Cerebrovascular atherosclerotic calcifications are noted. No hyperdense vessels. Skull: Negative for fracture or focal lesion. Sinuses/Orbits: Visualized portions of the orbits are  unremarkable. Visualized portions of the paranasal sinuses are unremarkable. Visualized portions of the mastoid air cells are unremarkable. Other: None. CT CERVICAL SPINE FINDINGS Alignment: Normal. Skull base and vertebrae: No acute fracture. No primary bone lesion or focal pathologic process. Soft tissues and spinal canal: No prevertebral fluid or swelling. No visible canal hematoma. Disc levels: Degenerative disease with disc height loss C3-4. Mild broad-based disc osteophyte complex at C3-4 with bilateral uncovertebral degenerative changes and bilateral foraminal stenosis. Mild bilateral facet arthropathy at C3-4. At C4-5 there is a broad central disc protrusion and left foraminal stenosis. At C5-6 there is a mild broad-based disc bulge. At C6-7 there is a right foraminal disc protrusion. Upper chest: Lung apices are clear. Other: No fluid collection or hematoma. Bilateral carotid artery atherosclerosis. IMPRESSION: 1. No acute intracranial pathology. 2. No acute osseous injury of the cervical spine. 3. Cervical spine spondylosis as described above. Electronically Signed   By: Elige Ko M.D.   On: 05/24/2023 15:48    Pending Labs Unresulted Labs (From admission, onward)     Start     Ordered   05/25/23 0537  Basic metabolic panel  Once,   TIMED        05/25/23 0537            Vitals/Pain Today's Vitals   05/25/23 0841 05/25/23 0900 05/25/23 0944 05/25/23 1007  BP:  123/61    Pulse:  64    Resp:  17    Temp:    98 F (36.7 C)  TempSrc:    Oral  SpO2:  100%    Weight:      Height:      PainSc: 0-No pain  0-No pain     Isolation Precautions No active isolations  Medications Medications  diltiazem (CARDIZEM CD) 24 hr capsule 240 mg (240 mg Oral Given 05/25/23 0942)  pantoprazole (PROTONIX) EC tablet 40 mg (40 mg Oral Given 05/25/23 0942)  sodium bicarbonate tablet 325 mg (325 mg Oral Given 05/25/23 0942)  pregabalin (LYRICA) capsule 200 mg (200 mg Oral Given 05/25/23 0942)   rOPINIRole (REQUIP) tablet 0.5 mg (0.5 mg Oral Given 05/24/23 2206)  sodium chloride flush (NS) 0.9 % injection 3 mL (3 mLs Intravenous Given 05/25/23 1008)  acetaminophen (TYLENOL) tablet 650 mg (has no administration in time range)    Or  acetaminophen (TYLENOL) suppository 650 mg (has no administration in time range)  polyethylene glycol (MIRALAX / GLYCOLAX) packet 17 g (has no administration in time range)  apixaban (ELIQUIS) tablet 5 mg (5 mg Oral Given  05/25/23 0942)  midodrine (PROAMATINE) tablet 10 mg (10 mg Oral Given 05/25/23 0800)  albumin human 25 % solution 50 g (50 g Intravenous New Bag/Given 05/25/23 1045)  sodium chloride tablet 2 g (0 g Oral Hold 05/25/23 0827)  iohexol (OMNIPAQUE) 350 MG/ML injection 75 mL (75 mLs Intravenous Contrast Given 05/24/23 1507)  sodium chloride 0.9 % bolus 500 mL (0 mLs Intravenous Stopped 05/24/23 1644)  potassium chloride SA (KLOR-CON M) CR tablet 40 mEq (40 mEq Oral Given 05/24/23 1600)  magnesium sulfate IVPB 1 g 100 mL (0 g Intravenous Stopped 05/24/23 1724)    Mobility walks     Focused Assessments    R Recommendations: See Admitting Provider Note  Report given to:   Additional Notes:

## 2023-05-25 NOTE — ED Notes (Signed)
Pt states he would like to sit on side of bed. Pt provided call light and telephone. Pt verbalized understanding to push call light to get adjusted in bed.

## 2023-05-25 NOTE — TOC CAGE-AID Note (Signed)
Transition of Care [facility]) - CAGE-AID Screening   Patient Details  Name: Jeffrey Randolph MRN: [id number] Date of Birth: 1956/03/21  Transition of Care [facility]) CM/SW Contact:    Katha Hamming, RN Phone Number: 05/25/2023, 9:07 PM   Clinical Narrative: Chart review with no alcohol or drug abuse, no resources indicated   CAGE-AID Screening:    Have You Ever Felt You Ought to Cut Down on Your Drinking or Drug Use?: No Have People Annoyed You By Critizing Your Drinking Or Drug Use?: No Have You Felt Bad Or Guilty About Your Drinking Or Drug Use?: No Have You Ever Had a Drink or Used Drugs First Thing In The Morning to Steady Your Nerves or to Get Rid of a Hangover?: No CAGE-AID Score: 0  Substance Abuse Education Offered: No

## 2023-05-25 NOTE — ED Notes (Signed)
Pt sitting on side of bed eating breakfast.

## 2023-05-25 NOTE — Progress Notes (Signed)
PROGRESS NOTE  Jeffrey Randolph NGE:[id number] DOB: Feb 29, 1956 DOA: 05/24/2023 PCP: Lonie Peak, PA-C   LOS: 1 day   Brief Narrative / Interim history: Jeffrey Randolph is a 67 y.o. male with medical history significant of hypertension, atrial fibrillation, stroke, spinal stenosis, cirrhosis, alcohol use, hyponatremia presenting after a fall. Patient was at [facility] when he began to feel significantly weak in the legs and fell to the ground. 911 was called and on EMS arrival patient blood pressure is within the 100s systolic. Denies any loss of consciousness. Did hit his head. No bleeding. He is on Eliquis. Received 100 cc of fluids and route. Does report some ongoing fatigue and generalized weakness this weakness was present prior to most recent admission as well.   Subjective / 24h Interval events: Continues to complain about ongoing weakness  Assesement and Plan: Principal Problem:   Hyponatremia Active Problems:   Hepatic cirrhosis (HCC)   Hypertension   Hypokalemia   History of stroke   Hypomagnesemia   Atrial fibrillation, chronic (HCC)   Spinal stenosis of lumbar region   History of alcohol abuse  Principal problem Hyponatremia-acute on chronic, multifactorial with underlying liver cirrhosis, nephrology consulted and following -To receive albumin, salt tabs, keep on midodrine to improve renal perfusion  Active problems Hypokalemia-supplement and continue to monitor  Fall, weakness-PT/OT  Essential hypertension-continue home diltiazem  PAF-continue diltiazem, Eliquis  History of CVA-on Eliquis  Liver cirrhosis-history of alcohol use, denies active drinking  Scheduled Meds:  apixaban  5 mg Oral BID   diltiazem  240 mg Oral Daily   midodrine  10 mg Oral TID WC   pantoprazole  40 mg Oral Daily   pregabalin  200 mg Oral TID   rOPINIRole  0.5 mg Oral QHS   sodium bicarbonate  325 mg Oral BID   sodium chloride flush  3 mL Intravenous Q12H   sodium chloride  2 g Oral TID  WC   Continuous Infusions:  albumin human 50 g (05/25/23 1045)   PRN Meds:.acetaminophen **OR** acetaminophen, polyethylene glycol  Current Outpatient Medications  Medication Instructions   apixaban (ELIQUIS) 5 mg, Oral, 2 times daily   diltiazem (CARDIZEM CD) 240 mg, Oral, Daily   DULoxetine (CYMBALTA) 30 mg, Oral, Daily   furosemide (LASIX) 40 mg, Oral, Daily   lactose free nutrition (BOOST) LIQD 1 Container, Oral, 2 times daily   omeprazole (PRILOSEC) 40 mg, Oral, Daily PRN   pregabalin (LYRICA) 200-600 mg, Oral, Daily   rOPINIRole (REQUIP) 0.5 mg, Oral, Daily PRN   sodium bicarbonate 325 mg, Oral, 2 times daily   sodium chloride 1 g, Oral, 3 times daily with meals   spironolactone (ALDACTONE) 25 mg, Oral, Daily    Diet Orders (From admission, onward)     Start     Ordered   05/25/23 0757  Diet regular Room service appropriate? Yes; Fluid consistency: Thin; Fluid restriction: 1500 mL Fluid  Diet effective now       Question Answer Comment  Room service appropriate? Yes   Fluid consistency: Thin   Fluid restriction: 1500 mL Fluid      05/25/23 0756            DVT prophylaxis:  apixaban (ELIQUIS) tablet 5 mg   Lab Results  Component Value Date   PLT 150 05/25/2023      Code Status: Full Code  Family Communication: no family at bedside   Status is: Inpatient Remains inpatient appropriate because: severity of illness  Level of care:  Progressive  Consultants:  Nephrology   Objective: Vitals:   05/25/23 1230 05/25/23 1300 05/25/23 1330 05/25/23 1343  BP: 126/62 129/64 (!) 125/54   Pulse: 61 63 62   Resp: 17 16 12    Temp:    98 F (36.7 C)  TempSrc:    Oral  SpO2: 100% 99% 100%   Weight:      Height:        Intake/Output Summary (Last 24 hours) at 05/25/2023 1344 Last data filed at 05/25/2023 0800 Gross per 24 hour  Intake 600 ml  Output 400 ml  Net 200 ml   Wt Readings from Last 3 Encounters:  05/24/23 79.4 kg  05/08/23 81 kg  01/06/23  79.4 kg    Examination:  Constitutional: NAD Eyes: no scleral icterus ENMT: Mucous membranes are moist.  Neck: normal, supple Respiratory: clear to auscultation bilaterally, no wheezing, no crackles. Normal respiratory effort. No accessory muscle use.  Cardiovascular: Regular rate and rhythm, no murmurs / rubs / gallops. No LE edema.  Abdomen: non distended, no tenderness. Bowel sounds positive.  Musculoskeletal: no clubbing / cyanosis.   Data Reviewed: I have independently reviewed following labs and imaging studies   CBC Recent Labs  Lab 05/24/23 1445 05/24/23 1456 05/25/23 0242  WBC 5.4  --  4.8  HGB 11.4* 12.6* 11.4*  HCT 32.1* 37.0* 34.2*  PLT 194  --  150  MCV 97.3  --  103.0*  MCH 34.5*  --  34.3*  MCHC 35.5  --  33.3  RDW 17.2*  --  17.9*    Recent Labs  Lab 05/24/23 1445 05/24/23 1456 05/24/23 1900 05/24/23 2213 05/25/23 0242 05/25/23 0952  NA 116* 118* 119* 120* 120* 124*  K 3.1* 3.1* 3.0* 3.5 3.6 3.4*  CL 84* 84* 85* 88* 88* 89*  CO2 19*  --  23 22 21* 24  GLUCOSE 126* 125* 104* 100* 109* 129*  BUN <5* <3* <5* <5* <5* <5*  CREATININE 0.59* 0.40* 0.60* 0.49* 0.43* 0.57*  CALCIUM 8.6*  --  8.9 8.6* 9.1 9.2  AST 40  --   --   --   --   --   ALT 23  --   --   --   --   --   ALKPHOS 74  --   --   --   --   --   BILITOT 2.2*  --   --   --   --   --   ALBUMIN 3.8  --   --   --   --   --   MG 1.8  --   --   --   --   --   LATICACIDVEN  --  2.4*  --   --   --   --   INR 1.8*  --   --   --   --   --     ------------------------------------------------------------------------------------------------------------------ No results for input(s): "CHOL", "HDL", "LDLCALC", "TRIG", "CHOLHDL", "LDLDIRECT" in the last 72 hours.  Lab Results  Component Value Date   HGBA1C 4.1 (L) 03/20/2022   ------------------------------------------------------------------------------------------------------------------ No results for input(s): "TSH", "T4TOTAL", "T3FREE",  "THYROIDAB" in the last 72 hours.  Invalid input(s): "FREET3"  Cardiac Enzymes No results for input(s): "CKMB", "TROPONINI", "MYOGLOBIN" in the last 168 hours.  Invalid input(s): "CK" ------------------------------------------------------------------------------------------------------------------ No results found for: "BNP"  CBG: No results for input(s): "GLUCAP" in the last 168 hours.  No results found for this or any previous visit (  from the past 240 hour(s)).   Radiology Studies: DG Pelvis Portable  Result Date: 05/24/2023 CLINICAL DATA:  Fall. EXAM: PORTABLE PELVIS 1-2 VIEWS COMPARISON:  None Available. FINDINGS: Minimal osteoarthritic change of the hips. No acute fracture or dislocation. Contrast within the bladder. Degenerative change of the spine. IMPRESSION: 1. No acute findings. 2. Minimal osteoarthritic change of the hips. Electronically Signed   By: Elberta Fortis M.D.   On: 05/24/2023 16:00   DG Chest Port 1 View  Result Date: 05/24/2023 CLINICAL DATA:  Fall. EXAM: PORTABLE CHEST 1 VIEW COMPARISON:  03/20/2022 FINDINGS: Lungs are adequately inflated with mild linear density over the right midlung likely atelectasis. Subtle no pleural effusion or pneumothorax. Cardiomediastinal silhouette is normal. No evidence of fracture. IMPRESSION: Mild linear density over the right midlung likely atelectasis. Electronically Signed   By: Elberta Fortis M.D.   On: 05/24/2023 15:59   CT CHEST ABDOMEN PELVIS W CONTRAST  Result Date: 05/24/2023 CLINICAL DATA:  Polytrauma, blunt, fall at restaurant today with head injury, anticoagulated for atrial fibrillation EXAM: CT CHEST, ABDOMEN, AND PELVIS WITH CONTRAST TECHNIQUE: Multidetector CT imaging of the chest, abdomen and pelvis was performed following the standard protocol during bolus administration of intravenous contrast. RADIATION DOSE REDUCTION: This exam was performed according to the departmental dose-optimization program which includes  automated exposure control, adjustment of the mA and/or kV according to patient size and/or use of iterative reconstruction technique. CONTRAST:  75mL OMNIPAQUE IOHEXOL 350 MG/ML SOLN COMPARISON:  03/20/2022 CT chest, abdomen and pelvis FINDINGS: CT CHEST FINDINGS Cardiovascular: Normal heart size. No significant pericardial fluid/thickening. Left anterior descending coronary atherosclerosis. Atherosclerotic nonaneurysmal thoracic aorta. Normal caliber pulmonary arteries. No evidence of acute thoracic aortic injury. No central pulmonary emboli. Mediastinum/Nodes: No pneumomediastinum. No mediastinal hematoma. No discrete thyroid nodules. Unremarkable esophagus. No axillary, mediastinal or hilar lymphadenopathy. Lungs/Pleura: No pneumothorax. No pleural effusion. Solid 3 mm right LL pulmonary nodule (series 5/image 105) is stable and considered benign. No acute consolidative airspace disease, lung masses or additional significant pulmonary nodules. Musculoskeletal: No aggressive appearing focal osseous lesions. No acute fracture in the chest. Healed chronic lateral right ninth rib fracture. Mild thoracic spondylosis. Symmetric mild gynecomastia, similar. CT ABDOMEN PELVIS FINDINGS Hepatobiliary: Normal liver with no liver laceration or mass. Cholelithiasis. No biliary ductal dilatation. Pancreas: Normal, with no laceration, mass or duct dilation. Spleen: Normal size. No laceration or mass. Adrenals/Urinary Tract: Normal adrenals. No hydronephrosis. No renal laceration. No renal mass. Normal bladder. Stomach/Bowel: Grossly normal stomach. Normal caliber small bowel with no small bowel wall thickening. Appendix not discretely visualized. Normal large bowel with no diverticulosis, large bowel wall thickening or pericolonic fat stranding. Vascular/Lymphatic: Atherosclerotic nonaneurysmal abdominal aorta. Patent renal veins. No pathologically enlarged lymph nodes in the abdomen or pelvis. Reproductive: Normal size  prostate. Other: No pneumoperitoneum, ascites or focal fluid collection. Musculoskeletal: No aggressive appearing focal osseous lesions. No acute fracture. Marked multilevel lumbar degenerative disc disease. Mild superior L1 vertebral compression deformity is chronic and unchanged. IMPRESSION: 1. No evidence of acute traumatic injury in the chest, abdomen or pelvis. 2. Cholelithiasis. 3. One vessel coronary atherosclerosis. 4. Symmetric mild gynecomastia, similar. Electronically Signed   By: Delbert Phenix M.D.   On: 05/24/2023 15:54   CT T-SPINE NO CHARGE  Result Date: 05/24/2023 CLINICAL DATA:  Trauma, back pain EXAM: CT THORACIC AND LUMBAR SPINE WITH CONTRAST TECHNIQUE: Multiplanar CT images of the thoracic and lumbar spine were reconstructed from contemporary CT of the Chest, Abdomen, and Pelvis. RADIATION DOSE REDUCTION:  This exam was performed according to the departmental dose-optimization program which includes automated exposure control, adjustment of the mA and/or kV according to patient size and/or use of iterative reconstruction technique. CONTRAST:  None or No additional COMPARISON:  03/20/2022 FINDINGS: CT THORACIC AND LUMBAR SPINE FINDINGS Alignment: Normal thoracic kyphosis. Normal lumbar lordosis. Vertebral bodies: Unchanged mild superior endplate wedge deformity of L1, with no greater than 20% anterior height loss. Vertebral bodies otherwise intact. No other fracture or dislocation. Disc spaces: Generally mild disc space height loss and osteophytosis of the cervical spine, focally severe at L2-L3. Paraspinous soft tissues: Coronary artery calcifications. Gallstones. Aortic atherosclerosis. IMPRESSION: 1. No acute fracture or dislocation of the thoracic or lumbar spine. 2. Unchanged mild superior endplate wedge deformity of L1, with no greater than 20% anterior height loss. 3. Coronary artery disease. 4. Cholelithiasis. Aortic Atherosclerosis (ICD10-I70.0). Electronically Signed   By: Jearld Lesch M.D.   On: 05/24/2023 15:52   CT L-SPINE NO CHARGE  Result Date: 05/24/2023 CLINICAL DATA:  Trauma, back pain EXAM: CT THORACIC AND LUMBAR SPINE WITH CONTRAST TECHNIQUE: Multiplanar CT images of the thoracic and lumbar spine were reconstructed from contemporary CT of the Chest, Abdomen, and Pelvis. RADIATION DOSE REDUCTION: This exam was performed according to the departmental dose-optimization program which includes automated exposure control, adjustment of the mA and/or kV according to patient size and/or use of iterative reconstruction technique. CONTRAST:  None or No additional COMPARISON:  03/20/2022 FINDINGS: CT THORACIC AND LUMBAR SPINE FINDINGS Alignment: Normal thoracic kyphosis. Normal lumbar lordosis. Vertebral bodies: Unchanged mild superior endplate wedge deformity of L1, with no greater than 20% anterior height loss. Vertebral bodies otherwise intact. No other fracture or dislocation. Disc spaces: Generally mild disc space height loss and osteophytosis of the cervical spine, focally severe at L2-L3. Paraspinous soft tissues: Coronary artery calcifications. Gallstones. Aortic atherosclerosis. IMPRESSION: 1. No acute fracture or dislocation of the thoracic or lumbar spine. 2. Unchanged mild superior endplate wedge deformity of L1, with no greater than 20% anterior height loss. 3. Coronary artery disease. 4. Cholelithiasis. Aortic Atherosclerosis (ICD10-I70.0). Electronically Signed   By: Jearld Lesch M.D.   On: 05/24/2023 15:52   CT HEAD WO CONTRAST  Result Date: 05/24/2023 CLINICAL DATA:  Poly trauma, blunt EXAM: CT HEAD WITHOUT CONTRAST CT CERVICAL SPINE WITHOUT CONTRAST TECHNIQUE: Multidetector CT imaging of the head and cervical spine was performed following the standard protocol without intravenous contrast. Multiplanar CT image reconstructions of the cervical spine were also generated. RADIATION DOSE REDUCTION: This exam was performed according to the departmental dose-optimization  program which includes automated exposure control, adjustment of the mA and/or kV according to patient size and/or use of iterative reconstruction technique. COMPARISON:  None Available. FINDINGS: CT HEAD FINDINGS Brain: No evidence of acute infarction, hemorrhage, extra-axial collection, ventriculomegaly, or mass effect. Generalized cerebral atrophy. Periventricular white matter low attenuation likely secondary to microangiopathy. Vascular: Cerebrovascular atherosclerotic calcifications are noted. No hyperdense vessels. Skull: Negative for fracture or focal lesion. Sinuses/Orbits: Visualized portions of the orbits are unremarkable. Visualized portions of the paranasal sinuses are unremarkable. Visualized portions of the mastoid air cells are unremarkable. Other: None. CT CERVICAL SPINE FINDINGS Alignment: Normal. Skull base and vertebrae: No acute fracture. No primary bone lesion or focal pathologic process. Soft tissues and spinal canal: No prevertebral fluid or swelling. No visible canal hematoma. Disc levels: Degenerative disease with disc height loss C3-4. Mild broad-based disc osteophyte complex at C3-4 with bilateral uncovertebral degenerative changes and bilateral foraminal stenosis. Mild bilateral  facet arthropathy at C3-4. At C4-5 there is a broad central disc protrusion and left foraminal stenosis. At C5-6 there is a mild broad-based disc bulge. At C6-7 there is a right foraminal disc protrusion. Upper chest: Lung apices are clear. Other: No fluid collection or hematoma. Bilateral carotid artery atherosclerosis. IMPRESSION: 1. No acute intracranial pathology. 2. No acute osseous injury of the cervical spine. 3. Cervical spine spondylosis as described above. Electronically Signed   By: Elige Ko M.D.   On: 05/24/2023 15:48   CT CERVICAL SPINE WO CONTRAST  Result Date: 05/24/2023 CLINICAL DATA:  Poly trauma, blunt EXAM: CT HEAD WITHOUT CONTRAST CT CERVICAL SPINE WITHOUT CONTRAST TECHNIQUE:  Multidetector CT imaging of the head and cervical spine was performed following the standard protocol without intravenous contrast. Multiplanar CT image reconstructions of the cervical spine were also generated. RADIATION DOSE REDUCTION: This exam was performed according to the departmental dose-optimization program which includes automated exposure control, adjustment of the mA and/or kV according to patient size and/or use of iterative reconstruction technique. COMPARISON:  None Available. FINDINGS: CT HEAD FINDINGS Brain: No evidence of acute infarction, hemorrhage, extra-axial collection, ventriculomegaly, or mass effect. Generalized cerebral atrophy. Periventricular white matter low attenuation likely secondary to microangiopathy. Vascular: Cerebrovascular atherosclerotic calcifications are noted. No hyperdense vessels. Skull: Negative for fracture or focal lesion. Sinuses/Orbits: Visualized portions of the orbits are unremarkable. Visualized portions of the paranasal sinuses are unremarkable. Visualized portions of the mastoid air cells are unremarkable. Other: None. CT CERVICAL SPINE FINDINGS Alignment: Normal. Skull base and vertebrae: No acute fracture. No primary bone lesion or focal pathologic process. Soft tissues and spinal canal: No prevertebral fluid or swelling. No visible canal hematoma. Disc levels: Degenerative disease with disc height loss C3-4. Mild broad-based disc osteophyte complex at C3-4 with bilateral uncovertebral degenerative changes and bilateral foraminal stenosis. Mild bilateral facet arthropathy at C3-4. At C4-5 there is a broad central disc protrusion and left foraminal stenosis. At C5-6 there is a mild broad-based disc bulge. At C6-7 there is a right foraminal disc protrusion. Upper chest: Lung apices are clear. Other: No fluid collection or hematoma. Bilateral carotid artery atherosclerosis. IMPRESSION: 1. No acute intracranial pathology. 2. No acute osseous injury of the cervical  spine. 3. Cervical spine spondylosis as described above. Electronically Signed   By: Elige Ko M.D.   On: 05/24/2023 15:48     Pamella Pert, MD, PhD [facility]  Between 7 am - 7 pm I am available, please contact me via Amion (for emergencies) or Securechat (non urgent messages)  Between 7 pm - 7 am I am not available, please contact night coverage MD/APP via Amion

## 2023-05-25 NOTE — Evaluation (Signed)
Physical Therapy Evaluation Patient Details Name: Osman Case MRN: [id number] DOB: 1956-07-09 Today's Date: 05/25/2023  History of Present Illness  Pt is a 67 y.o. male admitted 05/24/23 after feeling weak and falling at [facility]; pt hit his head, denies LOC. Workup for hyponatremia, hypokalemia. Of note, recent admission 05/08/23 with similar issues. Other PMH includes afib, HTN, CVA, hepatic cirrhosis.   Clinical Impression  Pt presents with an overall decrease in functional mobility secondary to above. PTA, pt lives alone behind friend's house, typically independent without DME though endorses multiple falls the past few months; uses scooter for community mobility. Today, pt's stability much improved with rollator for transfers and ambulation; pt in agreement. Initiated educ re: fall risk reduction, activity and DME recommendations. Pt would benefit from continued acute PT services to maximize functional mobility and independence prior to d/c with HHPT services.       If plan is discharge home, recommend the following: A little help with bathing/dressing/bathroom;Assistance with cooking/housework;Assist for transportation   Can travel by private vehicle    Yes    Equipment Recommendations Rollator (4 wheels)  Recommendations for Other Services   Mobility Specialist   Functional Status Assessment Patient has had a recent decline in their functional status and demonstrates the ability to make significant improvements in function in a reasonable and predictable amount of time.     Precautions / Restrictions Precautions Precautions: Fall Restrictions Weight Bearing Restrictions: No      Mobility  Bed Mobility Overal bed mobility: Modified Independent Bed Mobility: Sit to Supine                Transfers Overall transfer level: Needs assistance Equipment used: None Transfers: Sit to/from Stand Sit to Stand: Supervision                 Ambulation/Gait Ambulation/Gait assistance: Contact guard assist, Supervision Gait Distance (Feet): 440 Feet Assistive device: None, Rollator (4 wheels) Gait Pattern/deviations: Step-through pattern, Decreased stride length, Shuffle, Trunk flexed Gait velocity: Decreased     General Gait Details: slow, shuffling gait without DME, intermittent CGA for balance; stability improved with rollator use, ambulating ~400' at supervision-level; 1x seated rest break on rollator seat in hallway to test brake/seat functions  Stairs            Wheelchair Mobility     Tilt Bed    Modified Rankin (Stroke Patients Only)       Balance Overall balance assessment: Needs assistance Sitting-balance support: No upper extremity supported, Feet supported Sitting balance-Leahy Scale: Good     Standing balance support: No upper extremity supported, During functional activity Standing balance-Leahy Scale: Fair Standing balance comment: can stand and take steps without UE support                             Pertinent Vitals/Pain Pain Assessment Pain Assessment: No/denies pain    Home Living Family/patient expects to be discharged to:: Private residence Living Arrangements: Alone Available Help at Discharge: Friend(s);Available PRN/intermittently Type of Home: House ("cabin" behind his friend's home) Home Access: Level entry       Home Layout: One level Home Equipment: None Additional Comments: either sponge bathes or uses friend's shower in her tub/shower    Prior Function Prior Level of Function : Independent/Modified Independent;History of Falls (last six months)             Mobility Comments: no AD, 5-6 falls in the last 2-3  months ADLs Comments: drives scooter for community mobility or gets rides with friends; independent ADLs, IADLs     Extremity/Trunk Assessment   Upper Extremity Assessment Upper Extremity Assessment: Overall WFL for tasks assessed     Lower Extremity Assessment Lower Extremity Assessment: Overall WFL for tasks assessed    Cervical / Trunk Assessment Cervical / Trunk Assessment: Normal  Communication   Communication Communication: No apparent difficulties  Cognition Arousal: Alert Behavior During Therapy: WFL for tasks assessed/performed Overall Cognitive Status: Within Functional Limits for tasks assessed                                 General Comments: WFL for simple tasks, not formally assessed        General Comments General comments (skin integrity, edema, etc.): HR 56-80s, post-ambulation BP 133/72 (90). educ re: POC, activity recommendations, fall risk reduction at home, DME needs. pt interested in rollator for discharge if an option, states he would use inside and outside house    Exercises     Assessment/Plan    PT Assessment Patient needs continued PT services  PT Problem List Decreased activity tolerance;Decreased balance;Decreased mobility;Decreased knowledge of precautions       PT Treatment Interventions DME instruction;Gait training;Functional mobility training;Therapeutic activities;Therapeutic exercise;Balance training;Stair training;Patient/family education    PT Goals (Current goals can be found in the Care Plan section)  Acute Rehab PT Goals Patient Stated Goal: to go home PT Goal Formulation: With patient Time For Goal Achievement: 06/08/23 Potential to Achieve Goals: Good    Frequency Min 1X/week     Co-evaluation               AM-PAC PT "6 Clicks" Mobility  Outcome Measure Help needed turning from your back to your side while in a flat bed without using bedrails?: None Help needed moving from lying on your back to sitting on the side of a flat bed without using bedrails?: None Help needed moving to and from a bed to a chair (including a wheelchair)?: A Little Help needed standing up from a chair using your arms (e.g., wheelchair or bedside chair)?: A  Little Help needed to walk in hospital room?: A Little Help needed climbing 3-5 steps with a railing? : A Little 6 Click Score: 20    End of Session Equipment Utilized During Treatment: Gait belt Activity Tolerance: Patient tolerated treatment well Patient left: in bed;with call bell/phone within reach (ED stretcher) Nurse Communication: Mobility status PT Visit Diagnosis: Unsteadiness on feet (R26.81);Muscle weakness (generalized) (M62.81);History of falling (Z91.81);Repeated falls (R29.6)    Time: 1610-9604 PT Time Calculation (min) (ACUTE ONLY): 17 min   Charges:   PT Evaluation $PT Eval Low Complexity: 1 Low   PT General Charges $$ ACUTE PT VISIT: 1 Visit       Ina Homes, PT, DPT Acute Rehabilitation Services  Personal: Secure Chat Rehab Office: [phone]  Malachy Chamber 05/25/2023, 11:56 AM

## 2023-05-25 NOTE — ED Notes (Signed)
RN assisted pt to restroom using a wheelchair. Pt was able to transfer without assistance

## 2023-05-25 NOTE — ED Notes (Signed)
Pt provided a beverage.

## 2023-05-25 NOTE — ED Notes (Signed)
RN notified Rx about missing dose Albumin

## 2023-05-25 NOTE — ED Notes (Signed)
RN provided pt beverage

## 2023-05-26 DIAGNOSIS — I10 Essential (primary) hypertension: Secondary | ICD-10-CM

## 2023-05-26 DIAGNOSIS — E871 Hypo-osmolality and hyponatremia: Secondary | ICD-10-CM | POA: Diagnosis not present

## 2023-05-26 DIAGNOSIS — K7460 Unspecified cirrhosis of liver: Secondary | ICD-10-CM | POA: Diagnosis not present

## 2023-05-26 DIAGNOSIS — I4891 Unspecified atrial fibrillation: Secondary | ICD-10-CM | POA: Diagnosis not present

## 2023-05-26 LAB — CBC
HCT: 31.4 % — ABNORMAL LOW (ref 39.0–52.0)
Hemoglobin: 10.9 g/dL — ABNORMAL LOW (ref 13.0–17.0)
MCH: 34.5 pg — ABNORMAL HIGH (ref 26.0–34.0)
MCHC: 34.7 g/dL (ref 30.0–36.0)
MCV: 99.4 fL (ref 80.0–100.0)
Platelets: 144 10*3/uL — ABNORMAL LOW (ref 150–400)
RBC: 3.16 MIL/uL — ABNORMAL LOW (ref 4.22–5.81)
RDW: 17.6 % — ABNORMAL HIGH (ref 11.5–15.5)
WBC: 3.6 10*3/uL — ABNORMAL LOW (ref 4.0–10.5)
nRBC: 0 % (ref 0.0–0.2)

## 2023-05-26 LAB — COMPREHENSIVE METABOLIC PANEL
ALT: 22 U/L (ref 0–44)
AST: 26 U/L (ref 15–41)
Albumin: 4.9 g/dL (ref 3.5–5.0)
Alkaline Phosphatase: 75 U/L (ref 38–126)
Anion gap: 8 (ref 5–15)
BUN: <5 mg/dL — ABNORMAL LOW (ref 8–23)
CO2: 24 mmol/L (ref 22–32)
Calcium: 9.5 mg/dL (ref 8.9–10.3)
Chloride: 94 mmol/L — ABNORMAL LOW (ref 98–111)
Creatinine, Ser: 0.54 mg/dL — ABNORMAL LOW (ref 0.61–1.24)
GFR, Estimated: >60 mL/min (ref >60)
Glucose, Bld: 101 mg/dL — ABNORMAL HIGH (ref 70–99)
Potassium: 3.7 mmol/L (ref 3.5–5.1)
Sodium: 126 mmol/L — ABNORMAL LOW (ref 135–145)
Total Bilirubin: 2.5 mg/dL — ABNORMAL HIGH (ref 0.3–1.2)
Total Protein: 7.5 g/dL (ref 6.5–8.1)

## 2023-05-26 LAB — MAGNESIUM: Magnesium: 2 mg/dL (ref 1.7–2.4)

## 2023-05-26 NOTE — Progress Notes (Signed)
Physical Therapy Treatment Patient Details Name: Jeffrey Randolph MRN: [id number] DOB: [birth year] Today's Date: 05/26/2023   History of Present Illness Pt is a 67 y.o. male admitted 05/24/23 after feeling weak and falling at [facility]; pt hit his head, denies LOC. Workup for hyponatremia, hypokalemia. Of note, recent admission 05/08/23 with similar issues. Other PMH includes afib, HTN, CVA, hepatic cirrhosis.    PT Comments  Pt remains unsteady with gait and with frequent falls at home. He lives alone with limited support and expresses he is interested in going to rehab prior to return home alone. Considering his limited support and multiple falls feel this would be appropriate.    If plan is discharge home, recommend the following: A little help with bathing/dressing/bathroom;Assistance with cooking/housework;Assist for transportation;A little help with walking and/or transfers   Can travel by private vehicle     Yes  Equipment Recommendations  Rollator (4 wheels)    Recommendations for Other Services       Precautions / Restrictions Precautions Precautions: Fall Restrictions Weight Bearing Restrictions: No     Mobility  Bed Mobility Overal bed mobility: Modified Independent Bed Mobility: Supine to Sit     Supine to sit: Modified independent (Device/Increase time)          Transfers Overall transfer level: Needs assistance Equipment used: None Transfers: Sit to/from Stand Sit to Stand: Contact guard assist           General transfer comment: for safety    Ambulation/Gait Ambulation/Gait assistance: Contact guard assist, Min assist Gait Distance (Feet): 325 Feet Assistive device: Rollator (4 wheels), None Gait Pattern/deviations: Step-through pattern, Decreased stride length, Shuffle, Trunk flexed, Drifts right/left Gait velocity: decr Gait velocity interpretation: <1.8 ft/sec, indicate of risk for recurrent falls   General Gait Details: Without assistive device  assist for balance. With rollator verbal cues to stay closer to rollator.   Stairs             Wheelchair Mobility     Tilt Bed    Modified Rankin (Stroke Patients Only)       Balance Overall balance assessment: Needs assistance Sitting-balance support: No upper extremity supported, Feet supported Sitting balance-Leahy Scale: Good     Standing balance support: No upper extremity supported, During functional activity Standing balance-Leahy Scale: Fair                              Cognition Arousal: Alert Behavior During Therapy: WFL for tasks assessed/performed Overall Cognitive Status: Impaired/Different from baseline Area of Impairment: Problem solving                             Problem Solving: Slow processing, Requires verbal cues          Exercises      General Comments        Pertinent Vitals/Pain Pain Assessment Pain Assessment: No/denies pain    Home Living                          Prior Function            PT Goals (current goals can now be found in the care plan section) Acute Rehab PT Goals Patient Stated Goal: to go to rehab to get stronger Progress towards PT goals: Progressing toward goals    Frequency    Min 1X/week  PT Plan Discharge plan needs to be updated    Co-evaluation              AM-PAC PT "6 Clicks" Mobility   Outcome Measure  Help needed turning from your back to your side while in a flat bed without using bedrails?: None Help needed moving from lying on your back to sitting on the side of a flat bed without using bedrails?: None Help needed moving to and from a bed to a chair (including a wheelchair)?: A Little Help needed standing up from a chair using your arms (e.g., wheelchair or bedside chair)?: A Little Help needed to walk in hospital room?: A Little Help needed climbing 3-5 steps with a railing? : A Little 6 Click Score: 20    End of Session Equipment  Utilized During Treatment: Oxygen;Gait belt Activity Tolerance: Patient tolerated treatment well Patient left: in chair;with call bell/phone within reach;with chair alarm set   PT Visit Diagnosis: Unsteadiness on feet (R26.81);Muscle weakness (generalized) (M62.81);History of falling (Z91.81);Repeated falls (R29.6)     Time: 1724-1750 PT Time Calculation (min) (ACUTE ONLY): 26 min  Charges:    $Gait Training: 23-37 mins PT General Charges $$ ACUTE PT VISIT: 1 Visit                     [facility] PT Acute Rehabilitation Services Office [phone]    Jeffrey Randolph [name] 05/26/2023, 6:09 PM

## 2023-05-26 NOTE — Progress Notes (Signed)
PROGRESS NOTE        PATIENT DETAILS Name: Jeffrey Randolph Age: 67 y.o. Sex: male Date of Birth: [birth year] Admit Date: 05/24/2023 Admitting Physician Synetta Fail, MD WUJ:[id number], Jeffrey Randolph, [place]  Brief Summary: Patient is a 67 y.o.  male with history of alcoholic liver cirrhosis-recent hospitalization at [facility] from 8/1-8/5 for hyponatremia (secondary to cirrhosis/HFpEF)-stabilized-discharged with a sodium of 125-presented to the hospital with fatigue/weakness-he was found to have a sodium level of 116-and subsequently admitted to the hospitalist service.  Significant events: 8/1-8/5>> hospitalization at [facility] for hyponatremia. 8/17>> admit to [facility]-weakness/worsening hyponatremia  Significant studies: 8/17>> CT head: No acute abnormalities 8/17>> CT C-spine: No fracture/dislocation 8/17>> CT T-spine: No fracture/dislocation 8/17 CT L-spine: No active/dislocation 8/17>> CT chest/abdomen/pelvis: No traumatic injury.  Significant microbiology data: None  Procedures: None  Consults: Nephrology  Subjective: Lying comfortably in bed-denies any chest pain or shortness of breath.  Acknowledges drinking 2 large bottles of Gatorade and not restricting his free water intake at home.  Objective: Vitals: Blood pressure 124/60, pulse 62, temperature 97.9 F (36.6 C), temperature source Oral, resp. rate 17, height 5\' 9"  (1.753 m), weight 79.4 kg, SpO2 98%.   Exam: Gen Exam:Alert awake-not in any distress HEENT:atraumatic, normocephalic Chest: B/L clear to auscultation anteriorly CVS:S1S2 regular Abdomen:soft non tender, non distended Extremities:no edema Neurology: Non focal Skin: no rash  Pertinent Labs/Radiology:    Latest Ref Rng & Units 05/26/2023    2:53 AM 05/25/2023    2:42 AM 05/24/2023    2:56 PM  CBC  WBC 4.0 - 10.5 K/uL 3.6  4.8    Hemoglobin 13.0 - 17.0 g/dL 78.2  95.6  21.3   Hematocrit 39.0 - 52.0 % 31.4  34.2  37.0   Platelets 150 - 400  K/uL 144  150      Lab Results  Component Value Date   NA 126 (L) 05/26/2023   K 3.7 05/26/2023   CL 94 (L) 05/26/2023   CO2 24 05/26/2023      Assessment/Plan: Hyponatremia Volume status stable Suspect etiology is multifactorial but with significant component of excess of free water/tea toast diet  Sodium levels have improved with midodrine and IV albumin-has not required diuretics so far Remains asymptomatic-discussed with nephrology-Dr. Marisue Humble to evaluate today and provide further recommendations regarding when to resume his diuretic regimen.  Counseled patient regarding importance of being on some sort of fluid restriction and limiting excessive free water intake. Repeat electrolytes tomorrow  Alcoholic liver cirrhosis Compensated  HFpEF Euvolemic Diuretics held-see above discussion  PAF Sinus rhythm Cardizem/Eliquis  History of CVA Nonfocal exam Eliquis  HTN BP stable Cardizem  Chronic pain/neuropathy Lyrica/Requip  BMI: Estimated body mass index is 25.84 kg/m as calculated from the following:   Height as of this encounter: 5\' 9"  (1.753 m).   Weight as of this encounter: 79.4 kg.   Code status:   Code Status: Full Code   DVT Prophylaxis: apixaban (ELIQUIS) tablet 5 mg    Family Communication:  None at bedside  Disposition Plan: Status is: Inpatient Remains inpatient appropriate because: Severity of illness   Planned Discharge Destination:Home with home health-likely on 8/20   Diet: Diet Order             Diet regular Room service appropriate? Yes; Fluid consistency: Thin; Fluid restriction: 1500 mL Fluid  Diet effective now  Antimicrobial agents: Anti-infectives (From admission, onward)    None        MEDICATIONS: Scheduled Meds:  apixaban  5 mg Oral BID   diltiazem  240 mg Oral Daily   midodrine  10 mg Oral TID WC   pantoprazole  40 mg Oral Daily   pregabalin  200 mg Oral TID   rOPINIRole  0.5 mg  Oral QHS   sodium bicarbonate  325 mg Oral BID   sodium chloride flush  3 mL Intravenous Q12H   sodium chloride  2 g Oral TID WC   Continuous Infusions:  albumin human 50 g (05/26/23 0815)   PRN Meds:.acetaminophen **OR** acetaminophen, polyethylene glycol   I have personally reviewed following labs and imaging studies  LABORATORY DATA: CBC: Recent Labs  Lab 05/24/23 1445 05/24/23 1456 05/25/23 0242 05/26/23 0253  WBC 5.4  --  4.8 3.6*  HGB 11.4* 12.6* 11.4* 10.9*  HCT 32.1* 37.0* 34.2* 31.4*  MCV 97.3  --  103.0* 99.4  PLT 194  --  150 144*    Basic Metabolic Panel: Recent Labs  Lab 05/24/23 1445 05/24/23 1456 05/25/23 0242 05/25/23 0952 05/25/23 1512 05/25/23 1724 05/26/23 0253  NA 116*   < > 120* 124* 122* 123* 126*  K 3.1*   < > 3.6 3.4* 3.8 3.6 3.7  CL 84*   < > 88* 89* 89* 88* 94*  CO2 19*   < > 21* 24 24 24 24   GLUCOSE 126*   < > 109* 129* 106* 101* 101*  BUN <5*   < > <5* <5* <5* <5* <5*  CREATININE 0.59*   < > 0.43* 0.57* 0.48* 0.65 0.54*  CALCIUM 8.6*   < > 9.1 9.2 9.3 9.3 9.5  MG 1.8  --   --   --   --   --  2.0   < > = values in this interval not displayed.    GFR: Estimated Creatinine Clearance: 89.6 mL/min (A) (by C-G formula based on SCr of 0.54 mg/dL (L)).  Liver Function Tests: Recent Labs  Lab 05/24/23 1445 05/26/23 0253  AST 40 26  ALT 23 22  ALKPHOS 74 75  BILITOT 2.2* 2.5*  PROT 6.8 7.5  ALBUMIN 3.8 4.9   No results for input(s): "LIPASE", "AMYLASE" in the last 168 hours. No results for input(s): "AMMONIA" in the last 168 hours.  Coagulation Profile: Recent Labs  Lab 05/24/23 1445  INR 1.8*    Cardiac Enzymes: No results for input(s): "CKTOTAL", "CKMB", "CKMBINDEX", "TROPONINI" in the last 168 hours.  BNP (last 3 results) No results for input(s): "PROBNP" in the last 8760 hours.  Lipid Profile: No results for input(s): "CHOL", "HDL", "LDLCALC", "TRIG", "CHOLHDL", "LDLDIRECT" in the last 72 hours.  Thyroid  Function Tests: No results for input(s): "TSH", "T4TOTAL", "FREET4", "T3FREE", "THYROIDAB" in the last 72 hours.  Anemia Panel: No results for input(s): "VITAMINB12", "FOLATE", "FERRITIN", "TIBC", "IRON", "RETICCTPCT" in the last 72 hours.  Urine analysis:    Component Value Date/Time   COLORURINE YELLOW 05/24/2023 1621   APPEARANCEUR CLEAR 05/24/2023 1621   LABSPEC 1.024 05/24/2023 1621   PHURINE 7.0 05/24/2023 1621   GLUCOSEU NEGATIVE 05/24/2023 1621   HGBUR SMALL (A) 05/24/2023 1621   BILIRUBINUR NEGATIVE 05/24/2023 1621   KETONESUR NEGATIVE 05/24/2023 1621   PROTEINUR NEGATIVE 05/24/2023 1621   UROBILINOGEN 0.2 08/03/2011 1937   NITRITE NEGATIVE 05/24/2023 1621   LEUKOCYTESUR NEGATIVE 05/24/2023 1621    Sepsis Labs: Lactic Acid, Venous  Component Value Date/Time   LATICACIDVEN 2.4 (HH) 05/24/2023 1456    MICROBIOLOGY: No results found for this or any previous visit (from the past 240 hour(s)).  RADIOLOGY STUDIES/RESULTS: DG Pelvis Portable  Result Date: 05/24/2023 CLINICAL DATA:  Fall. EXAM: PORTABLE PELVIS 1-2 VIEWS COMPARISON:  None Available. FINDINGS: Minimal osteoarthritic change of the hips. No acute fracture or dislocation. Contrast within the bladder. Degenerative change of the spine. IMPRESSION: 1. No acute findings. 2. Minimal osteoarthritic change of the hips. Electronically Signed   By: Elberta Fortis M.D.   On: 05/24/2023 16:00   DG Chest Port 1 View  Result Date: 05/24/2023 CLINICAL DATA:  Fall. EXAM: PORTABLE CHEST 1 VIEW COMPARISON:  03/20/2022 FINDINGS: Lungs are adequately inflated with mild linear density over the right midlung likely atelectasis. Subtle no pleural effusion or pneumothorax. Cardiomediastinal silhouette is normal. No evidence of fracture. IMPRESSION: Mild linear density over the right midlung likely atelectasis. Electronically Signed   By: Elberta Fortis M.D.   On: 05/24/2023 15:59   CT CHEST ABDOMEN PELVIS W CONTRAST  Result Date:  05/24/2023 CLINICAL DATA:  Polytrauma, blunt, fall at restaurant today with head injury, anticoagulated for atrial fibrillation EXAM: CT CHEST, ABDOMEN, AND PELVIS WITH CONTRAST TECHNIQUE: Multidetector CT imaging of the chest, abdomen and pelvis was performed following the standard protocol during bolus administration of intravenous contrast. RADIATION DOSE REDUCTION: This exam was performed according to the departmental dose-optimization program which includes automated exposure control, adjustment of the mA and/or kV according to patient size and/or use of iterative reconstruction technique. CONTRAST:  75mL OMNIPAQUE IOHEXOL 350 MG/ML SOLN COMPARISON:  03/20/2022 CT chest, abdomen and pelvis FINDINGS: CT CHEST FINDINGS Cardiovascular: Normal heart size. No significant pericardial fluid/thickening. Left anterior descending coronary atherosclerosis. Atherosclerotic nonaneurysmal thoracic aorta. Normal caliber pulmonary arteries. No evidence of acute thoracic aortic injury. No central pulmonary emboli. Mediastinum/Nodes: No pneumomediastinum. No mediastinal hematoma. No discrete thyroid nodules. Unremarkable esophagus. No axillary, mediastinal or hilar lymphadenopathy. Lungs/Pleura: No pneumothorax. No pleural effusion. Solid 3 mm right LL pulmonary nodule (series 5/image 105) is stable and considered benign. No acute consolidative airspace disease, lung masses or additional significant pulmonary nodules. Musculoskeletal: No aggressive appearing focal osseous lesions. No acute fracture in the chest. Healed chronic lateral right ninth rib fracture. Mild thoracic spondylosis. Symmetric mild gynecomastia, similar. CT ABDOMEN PELVIS FINDINGS Hepatobiliary: Normal liver with no liver laceration or mass. Cholelithiasis. No biliary ductal dilatation. Pancreas: Normal, with no laceration, mass or duct dilation. Spleen: Normal size. No laceration or mass. Adrenals/Urinary Tract: Normal adrenals. No hydronephrosis. No renal  laceration. No renal mass. Normal bladder. Stomach/Bowel: Grossly normal stomach. Normal caliber small bowel with no small bowel wall thickening. Appendix not discretely visualized. Normal large bowel with no diverticulosis, large bowel wall thickening or pericolonic fat stranding. Vascular/Lymphatic: Atherosclerotic nonaneurysmal abdominal aorta. Patent renal veins. No pathologically enlarged lymph nodes in the abdomen or pelvis. Reproductive: Normal size prostate. Other: No pneumoperitoneum, ascites or focal fluid collection. Musculoskeletal: No aggressive appearing focal osseous lesions. No acute fracture. Marked multilevel lumbar degenerative disc disease. Mild superior L1 vertebral compression deformity is chronic and unchanged. IMPRESSION: 1. No evidence of acute traumatic injury in the chest, abdomen or pelvis. 2. Cholelithiasis. 3. One vessel coronary atherosclerosis. 4. Symmetric mild gynecomastia, similar. Electronically Signed   By: Delbert Phenix M.D.   On: 05/24/2023 15:54   CT T-SPINE NO CHARGE  Result Date: 05/24/2023 CLINICAL DATA:  Trauma, back pain EXAM: CT THORACIC AND LUMBAR SPINE WITH CONTRAST TECHNIQUE: Multiplanar  CT images of the thoracic and lumbar spine were reconstructed from contemporary CT of the Chest, Abdomen, and Pelvis. RADIATION DOSE REDUCTION: This exam was performed according to the departmental dose-optimization program which includes automated exposure control, adjustment of the mA and/or kV according to patient size and/or use of iterative reconstruction technique. CONTRAST:  None or No additional COMPARISON:  03/20/2022 FINDINGS: CT THORACIC AND LUMBAR SPINE FINDINGS Alignment: Normal thoracic kyphosis. Normal lumbar lordosis. Vertebral bodies: Unchanged mild superior endplate wedge deformity of L1, with no greater than 20% anterior height loss. Vertebral bodies otherwise intact. No other fracture or dislocation. Disc spaces: Generally mild disc space height loss and  osteophytosis of the cervical spine, focally severe at L2-L3. Paraspinous soft tissues: Coronary artery calcifications. Gallstones. Aortic atherosclerosis. IMPRESSION: 1. No acute fracture or dislocation of the thoracic or lumbar spine. 2. Unchanged mild superior endplate wedge deformity of L1, with no greater than 20% anterior height loss. 3. Coronary artery disease. 4. Cholelithiasis. Aortic Atherosclerosis (ICD10-I70.0). Electronically Signed   By: Jearld Lesch M.D.   On: 05/24/2023 15:52   CT L-SPINE NO CHARGE  Result Date: 05/24/2023 CLINICAL DATA:  Trauma, back pain EXAM: CT THORACIC AND LUMBAR SPINE WITH CONTRAST TECHNIQUE: Multiplanar CT images of the thoracic and lumbar spine were reconstructed from contemporary CT of the Chest, Abdomen, and Pelvis. RADIATION DOSE REDUCTION: This exam was performed according to the departmental dose-optimization program which includes automated exposure control, adjustment of the mA and/or kV according to patient size and/or use of iterative reconstruction technique. CONTRAST:  None or No additional COMPARISON:  03/20/2022 FINDINGS: CT THORACIC AND LUMBAR SPINE FINDINGS Alignment: Normal thoracic kyphosis. Normal lumbar lordosis. Vertebral bodies: Unchanged mild superior endplate wedge deformity of L1, with no greater than 20% anterior height loss. Vertebral bodies otherwise intact. No other fracture or dislocation. Disc spaces: Generally mild disc space height loss and osteophytosis of the cervical spine, focally severe at L2-L3. Paraspinous soft tissues: Coronary artery calcifications. Gallstones. Aortic atherosclerosis. IMPRESSION: 1. No acute fracture or dislocation of the thoracic or lumbar spine. 2. Unchanged mild superior endplate wedge deformity of L1, with no greater than 20% anterior height loss. 3. Coronary artery disease. 4. Cholelithiasis. Aortic Atherosclerosis (ICD10-I70.0). Electronically Signed   By: Jearld Lesch M.D.   On: 05/24/2023 15:52   CT  HEAD WO CONTRAST  Result Date: 05/24/2023 CLINICAL DATA:  Poly trauma, blunt EXAM: CT HEAD WITHOUT CONTRAST CT CERVICAL SPINE WITHOUT CONTRAST TECHNIQUE: Multidetector CT imaging of the head and cervical spine was performed following the standard protocol without intravenous contrast. Multiplanar CT image reconstructions of the cervical spine were also generated. RADIATION DOSE REDUCTION: This exam was performed according to the departmental dose-optimization program which includes automated exposure control, adjustment of the mA and/or kV according to patient size and/or use of iterative reconstruction technique. COMPARISON:  None Available. FINDINGS: CT HEAD FINDINGS Brain: No evidence of acute infarction, hemorrhage, extra-axial collection, ventriculomegaly, or mass effect. Generalized cerebral atrophy. Periventricular white matter low attenuation likely secondary to microangiopathy. Vascular: Cerebrovascular atherosclerotic calcifications are noted. No hyperdense vessels. Skull: Negative for fracture or focal lesion. Sinuses/Orbits: Visualized portions of the orbits are unremarkable. Visualized portions of the paranasal sinuses are unremarkable. Visualized portions of the mastoid air cells are unremarkable. Other: None. CT CERVICAL SPINE FINDINGS Alignment: Normal. Skull base and vertebrae: No acute fracture. No primary bone lesion or focal pathologic process. Soft tissues and spinal canal: No prevertebral fluid or swelling. No visible canal hematoma. Disc levels: Degenerative disease with  disc height loss C3-4. Mild broad-based disc osteophyte complex at C3-4 with bilateral uncovertebral degenerative changes and bilateral foraminal stenosis. Mild bilateral facet arthropathy at C3-4. At C4-5 there is a broad central disc protrusion and left foraminal stenosis. At C5-6 there is a mild broad-based disc bulge. At C6-7 there is a right foraminal disc protrusion. Upper chest: Lung apices are clear. Other: No fluid  collection or hematoma. Bilateral carotid artery atherosclerosis. IMPRESSION: 1. No acute intracranial pathology. 2. No acute osseous injury of the cervical spine. 3. Cervical spine spondylosis as described above. Electronically Signed   By: Elige Ko M.D.   On: 05/24/2023 15:48   CT CERVICAL SPINE WO CONTRAST  Result Date: 05/24/2023 CLINICAL DATA:  Poly trauma, blunt EXAM: CT HEAD WITHOUT CONTRAST CT CERVICAL SPINE WITHOUT CONTRAST TECHNIQUE: Multidetector CT imaging of the head and cervical spine was performed following the standard protocol without intravenous contrast. Multiplanar CT image reconstructions of the cervical spine were also generated. RADIATION DOSE REDUCTION: This exam was performed according to the departmental dose-optimization program which includes automated exposure control, adjustment of the mA and/or kV according to patient size and/or use of iterative reconstruction technique. COMPARISON:  None Available. FINDINGS: CT HEAD FINDINGS Brain: No evidence of acute infarction, hemorrhage, extra-axial collection, ventriculomegaly, or mass effect. Generalized cerebral atrophy. Periventricular white matter low attenuation likely secondary to microangiopathy. Vascular: Cerebrovascular atherosclerotic calcifications are noted. No hyperdense vessels. Skull: Negative for fracture or focal lesion. Sinuses/Orbits: Visualized portions of the orbits are unremarkable. Visualized portions of the paranasal sinuses are unremarkable. Visualized portions of the mastoid air cells are unremarkable. Other: None. CT CERVICAL SPINE FINDINGS Alignment: Normal. Skull base and vertebrae: No acute fracture. No primary bone lesion or focal pathologic process. Soft tissues and spinal canal: No prevertebral fluid or swelling. No visible canal hematoma. Disc levels: Degenerative disease with disc height loss C3-4. Mild broad-based disc osteophyte complex at C3-4 with bilateral uncovertebral degenerative changes and  bilateral foraminal stenosis. Mild bilateral facet arthropathy at C3-4. At C4-5 there is a broad central disc protrusion and left foraminal stenosis. At C5-6 there is a mild broad-based disc bulge. At C6-7 there is a right foraminal disc protrusion. Upper chest: Lung apices are clear. Other: No fluid collection or hematoma. Bilateral carotid artery atherosclerosis. IMPRESSION: 1. No acute intracranial pathology. 2. No acute osseous injury of the cervical spine. 3. Cervical spine spondylosis as described above. Electronically Signed   By: Elige Ko M.D.   On: 05/24/2023 15:48     LOS: 2 days   Jeoffrey Massed, MD  [facility]    To contact the attending provider between 7A-7P or the covering provider during after hours 7P-7A, please log into the web site [email or web address] and access using universal [place] password for that web site. If you do not have the password, please call the hospital operator.  05/26/2023, 8:48 AM

## 2023-05-26 NOTE — Consult Note (Signed)
   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  Review of patient's electronic medical record reveals patient is admitted with hyponatremia. 3:55 pm Patient asleep on rounds, no family at the bedside, will follow for post hospital needs. Patient is from home.  Plan:  Continue to follow progress and disposition to assess for post hospital community care coordination/management needs.   Referral request for community care coordination: pending disposition needs    Of note, [facility] Care Management/Population Health does not replace or interfere with any arrangements made by the Inpatient Transition of Care team.  For questions contact:   Charlesetta Shanks, RN BSN CCM [facility]  [phone] business mobile phone Toll free office [phone]  Fax number: [phone] [email or web address]

## 2023-05-26 NOTE — Plan of Care (Signed)
  Problem: Education: Goal: Knowledge of General Education information will improve Description: Including pain rating scale, medication(s)/side effects and non-pharmacologic comfort measures Outcome: Progressing   Problem: Health Behavior/Discharge Planning: Goal: Ability to manage health-related needs will improve Outcome: Progressing   Problem: Clinical Measurements: Goal: Ability to maintain clinical measurements within normal limits will improve Outcome: Progressing Goal: Diagnostic test results will improve Outcome: Progressing   Problem: Coping: Goal: Level of anxiety will decrease Outcome: Progressing

## 2023-05-26 NOTE — Progress Notes (Signed)
Admit: 05/24/2023 LOS: 2  52M with recurrent hypotonic hyponatremia; hx/o cirrhosis and HFpEF  Subjective:  SNa up to 126 today On Na tabs and NaHCO3 Was drinking a lot of fluids prior to admission - soda, gatorade, water, etc UNa < 10, U Osm 337 consistent with ineffective arterial blood volume.   No HA, vision changes, N/V  08/18 0701 - 08/19 0700 In: 373.3 [IV Piggyback:373.3] Out: 950 [Urine:950]  Filed Weights   05/24/23 1432  Weight: 79.4 kg    Scheduled Meds:  apixaban  5 mg Oral BID   diltiazem  240 mg Oral Daily   midodrine  10 mg Oral TID WC   pantoprazole  40 mg Oral Daily   pregabalin  200 mg Oral TID   rOPINIRole  0.5 mg Oral QHS   sodium bicarbonate  325 mg Oral BID   sodium chloride flush  3 mL Intravenous Q12H   sodium chloride  2 g Oral TID WC   Continuous Infusions:  albumin human 50 g (05/26/23 0815)   PRN Meds:.acetaminophen **OR** acetaminophen, polyethylene glycol  Current Labs: reviewed   Latest Reference Range & Units 05/25/23 17:27  Osmolality, Urine 300 - 900 mOsm/kg 337  Sodium, Urine mmol/L <10    Latest Reference Range & Units 05/24/23 22:13  Osmolality 275 - 295 mOsm/kg 255 (L)  (L): Data is abnormally low  Physical Exam:  Blood pressure 127/61, pulse (!) 55, temperature 98.4 F (36.9 C), temperature source Oral, resp. rate 15, height 5\' 9"  (1.753 m), weight 79.4 kg, SpO2 97%. NAD, elderly male RRR CTAB S/nt/nd No sig LEE Nonfocal, CN2-12 intact  A Hypotonic hyponatremia: vol status seems euvolemic.  Ithink this is probably cirrhotic physiology and free water in excess of solute exretion.  Needs fluid restriction, good PO (protein).  Na tabs not a great idea with cirrhosis.   Cirrhosis not overtly with ascites or edema; Alb ok.    P Fluid restriction 1.2L Would not resumediuretics right now Stop sodium tabs Push protien Trend if stable tomorrow can f/u as outpt  Sabra Heck MD 05/26/2023, 12:07 PM  Recent Labs  Lab  05/25/23 1512 05/25/23 1724 05/26/23 0253  NA 122* 123* 126*  K 3.8 3.6 3.7  CL 89* 88* 94*  CO2 24 24 24   GLUCOSE 106* 101* 101*  BUN <5* <5* <5*  CREATININE 0.48* 0.65 0.54*  CALCIUM 9.3 9.3 9.5   Recent Labs  Lab 05/24/23 1445 05/24/23 1456 05/25/23 0242 05/26/23 0253  WBC 5.4  --  4.8 3.6*  HGB 11.4* 12.6* 11.4* 10.9*  HCT 32.1* 37.0* 34.2* 31.4*  MCV 97.3  --  103.0* 99.4  PLT 194  --  150 144*

## 2023-05-27 DIAGNOSIS — I10 Essential (primary) hypertension: Secondary | ICD-10-CM | POA: Diagnosis not present

## 2023-05-27 DIAGNOSIS — I4891 Unspecified atrial fibrillation: Secondary | ICD-10-CM | POA: Diagnosis not present

## 2023-05-27 DIAGNOSIS — E871 Hypo-osmolality and hyponatremia: Secondary | ICD-10-CM | POA: Diagnosis not present

## 2023-05-27 DIAGNOSIS — K7460 Unspecified cirrhosis of liver: Secondary | ICD-10-CM | POA: Diagnosis not present

## 2023-05-27 LAB — BASIC METABOLIC PANEL
Anion gap: 9 (ref 5–15)
BUN: 5 mg/dL — ABNORMAL LOW (ref 8–23)
CO2: 22 mmol/L (ref 22–32)
Calcium: 9.6 mg/dL (ref 8.9–10.3)
Chloride: 94 mmol/L — ABNORMAL LOW (ref 98–111)
Creatinine, Ser: 0.54 mg/dL — ABNORMAL LOW (ref 0.61–1.24)
GFR, Estimated: >60 mL/min (ref >60)
Glucose, Bld: 106 mg/dL — ABNORMAL HIGH (ref 70–99)
Potassium: 3.5 mmol/L (ref 3.5–5.1)
Sodium: 125 mmol/L — ABNORMAL LOW (ref 135–145)

## 2023-05-27 MED ORDER — UREA 15 G PO PACK
15.0000 g | PACK | Freq: Two times a day (BID) | ORAL | Status: DC
  Administered 2023-05-27 – 2023-05-29 (×5): 15 g via ORAL
  Filled 2023-05-27 (×6): qty 1

## 2023-05-27 NOTE — Discharge Instructions (Addendum)
Information on my medicine - ELIQUIS? (apixaban) ? ?This medication education was reviewed with me or my healthcare representative as part of my discharge preparation.  ? ?Why was Eliquis? prescribed for you? ?Eliquis? was prescribed for you to reduce the risk of forming blood clots that can cause a stroke if you have a medical condition called atrial fibrillation (a type of irregular heartbeat) OR to reduce the risk of a blood clots forming after orthopedic surgery. ? ?What do You need to know about Eliquis? ? ?Take your Eliquis? TWICE DAILY - one tablet in the morning and one tablet in the evening with or without food.  It would be best to take the doses about the same time each day. ? ?If you have difficulty swallowing the tablet whole please discuss with your pharmacist how to take the medication safely. ? ?Take Eliquis? exactly as prescribed by your doctor and DO NOT stop taking Eliquis? without talking to the doctor who prescribed the medication.  Stopping may increase your risk of developing a new clot or stroke.  Refill your prescription before you run out. ? ?After discharge, you should have regular check-up appointments with your healthcare provider that is prescribing your Eliquis?.  In the future your dose may need to be changed if your kidney function or weight changes by a significant amount or as you get older. ? ?What do you do if you miss a dose? ?If you miss a dose, take it as soon as you remember on the same day and resume taking twice daily.  Do not take more than one dose of ELIQUIS at the same time. ? ?Important Safety Information ?A possible side effect of Eliquis? is bleeding. You should call your healthcare provider right away if you experience any of the following: ?Bleeding from an injury or your nose that does not stop. ?Unusual colored urine (red or dark brown) or unusual colored stools (red or black). ?Unusual bruising for unknown reasons. ?A serious fall or if you hit your head (even  if there is no bleeding). ? ?Some medicines may interact with Eliquis? and might increase your risk of bleeding or clotting while on Eliquis?. To help avoid this, consult your healthcare provider or pharmacist prior to using any new prescription or non-prescription medications, including herbals, vitamins, non-steroidal anti-inflammatory drugs (NSAIDs) and supplements. ? ?This website has more information on Eliquis? (apixaban): http://www.eliquis.com/eliquis/home ?  ?

## 2023-05-27 NOTE — Progress Notes (Signed)
PROGRESS NOTE        PATIENT DETAILS Name: Jeffrey Randolph Age: 67 y.o. Sex: male Date of Birth: [birth year] Admit Date: 05/24/2023 Admitting Physician Synetta Fail, MD WJX:BJYNWG, [name], [place]  Brief Summary: Patient is a 67 y.o.  male with history of alcoholic liver cirrhosis-recent hospitalization at [facility] from 8/1-8/5 for hyponatremia (secondary to cirrhosis/HFpEF)-stabilized-discharged with a sodium of 125-presented to the hospital with fatigue/weakness-he was found to have a sodium level of 116-and subsequently admitted to the hospitalist service.  Significant events: 8/1-8/5>> hospitalization at [facility] for hyponatremia. 8/17>> admit to [facility]-weakness/worsening hyponatremia  Significant studies: 8/17>> CT head: No acute abnormalities 8/17>> CT C-spine: No fracture/dislocation 8/17>> CT T-spine: No fracture/dislocation 8/17 CT L-spine: No active/dislocation 8/17>> CT chest/abdomen/pelvis: No traumatic injury.  Significant microbiology data: None  Procedures: None  Consults: Nephrology  Subjective: Sodium levels essentially unchanged-drank 3 pitchers of water yesterday, along with soda/juice.  Objective: Vitals: Blood pressure (!) 118/51, pulse (!) 57, temperature 98 F (36.7 C), temperature source Oral, resp. rate 16, height 5\' 9"  (1.753 m), weight 79.4 kg, SpO2 95%.   Exam: Gen Exam:Alert awake-not in any distress HEENT:atraumatic, normocephalic Chest: B/L clear to auscultation anteriorly CVS:S1S2 regular Abdomen:soft non tender, non distended Extremities:no edema Neurology: Non focal Skin: no rash  Pertinent Labs/Radiology:    Latest Ref Rng & Units 05/26/2023    2:53 AM 05/25/2023    2:42 AM 05/24/2023    2:56 PM  CBC  WBC 4.0 - 10.5 K/uL 3.6  4.8    Hemoglobin 13.0 - 17.0 g/dL 95.6  21.3  08.6   Hematocrit 39.0 - 52.0 % 31.4  34.2  37.0   Platelets 150 - 400 K/uL 144  150      Lab Results  Component Value Date   NA 125 (L)  05/27/2023   K 3.5 05/27/2023   CL 94 (L) 05/27/2023   CO2 22 05/27/2023      Assessment/Plan: Hyponatremia Volume status stable Suspect etiology is multifactorial but with significant component of excess of free water/tea toast diet  Sodium levels have stabilized-and remains essentially the same-unfortunately even with counseling-he is still drinking excessive amount of water/soda Discussed with nursing staff-need to be a bit more strict with fluid restriction No signs of volume overload-nephrology continues to recommend supportive care/fluid restriction-no indication for diuretics at this point.  Alcoholic liver cirrhosis Compensated  HFpEF Euvolemic Monitor off diuretics for now  PAF Sinus rhythm Cardizem/Eliquis  History of CVA Nonfocal exam Eliquis  HTN BP stable Cardizem  Chronic pain/neuropathy Lyrica/Requip  Debility/deconditioning Frequent falls-reevaluated by PT on 8/19-recommendations are for SNF.  BMI: Estimated body mass index is 25.84 kg/m as calculated from the following:   Height as of this encounter: 5\' 9"  (1.753 m).   Weight as of this encounter: 79.4 kg.   Code status:   Code Status: Full Code   DVT Prophylaxis: apixaban (ELIQUIS) tablet 5 mg    Family Communication:  None at bedside  Disposition Plan: Status is: Inpatient Remains inpatient appropriate because: Severity of illness   Planned Discharge Destination: SNF   Diet: Diet Order             Diet regular Room service appropriate? Yes; Fluid consistency: Thin; Fluid restriction: 1200 mL Fluid  Diet effective now  Antimicrobial agents: Anti-infectives (From admission, onward)    None        MEDICATIONS: Scheduled Meds:  apixaban  5 mg Oral BID   diltiazem  240 mg Oral Daily   midodrine  10 mg Oral TID WC   pantoprazole  40 mg Oral Daily   pregabalin  200 mg Oral TID   rOPINIRole  0.5 mg Oral QHS   sodium chloride flush  3 mL  Intravenous Q12H   urea  15 g Oral BID   Continuous Infusions:  albumin human 50 g (05/27/23 0813)   PRN Meds:.acetaminophen **OR** acetaminophen, polyethylene glycol   I have personally reviewed following labs and imaging studies  LABORATORY DATA: CBC: Recent Labs  Lab 05/24/23 1445 05/24/23 1456 05/25/23 0242 05/26/23 0253  WBC 5.4  --  4.8 3.6*  HGB 11.4* 12.6* 11.4* 10.9*  HCT 32.1* 37.0* 34.2* 31.4*  MCV 97.3  --  103.0* 99.4  PLT 194  --  150 144*    Basic Metabolic Panel: Recent Labs  Lab 05/24/23 1445 05/24/23 1456 05/25/23 0952 05/25/23 1512 05/25/23 1724 05/26/23 0253 05/27/23 0122  NA 116*   < > 124* 122* 123* 126* 125*  K 3.1*   < > 3.4* 3.8 3.6 3.7 3.5  CL 84*   < > 89* 89* 88* 94* 94*  CO2 19*   < > 24 24 24 24 22   GLUCOSE 126*   < > 129* 106* 101* 101* 106*  BUN <5*   < > <5* <5* <5* <5* 5*  CREATININE 0.59*   < > 0.57* 0.48* 0.65 0.54* 0.54*  CALCIUM 8.6*   < > 9.2 9.3 9.3 9.5 9.6  MG 1.8  --   --   --   --  2.0  --    < > = values in this interval not displayed.    GFR: Estimated Creatinine Clearance: 89.6 mL/min (A) (by C-G formula based on SCr of 0.54 mg/dL (L)).  Liver Function Tests: Recent Labs  Lab 05/24/23 1445 05/26/23 0253  AST 40 26  ALT 23 22  ALKPHOS 74 75  BILITOT 2.2* 2.5*  PROT 6.8 7.5  ALBUMIN 3.8 4.9   No results for input(s): "LIPASE", "AMYLASE" in the last 168 hours. No results for input(s): "AMMONIA" in the last 168 hours.  Coagulation Profile: Recent Labs  Lab 05/24/23 1445  INR 1.8*    Cardiac Enzymes: No results for input(s): "CKTOTAL", "CKMB", "CKMBINDEX", "TROPONINI" in the last 168 hours.  BNP (last 3 results) No results for input(s): "PROBNP" in the last 8760 hours.  Lipid Profile: No results for input(s): "CHOL", "HDL", "LDLCALC", "TRIG", "CHOLHDL", "LDLDIRECT" in the last 72 hours.  Thyroid Function Tests: No results for input(s): "TSH", "T4TOTAL", "FREET4", "T3FREE", "THYROIDAB" in the  last 72 hours.  Anemia Panel: No results for input(s): "VITAMINB12", "FOLATE", "FERRITIN", "TIBC", "IRON", "RETICCTPCT" in the last 72 hours.  Urine analysis:    Component Value Date/Time   COLORURINE YELLOW 05/24/2023 1621   APPEARANCEUR CLEAR 05/24/2023 1621   LABSPEC 1.024 05/24/2023 1621   PHURINE 7.0 05/24/2023 1621   GLUCOSEU NEGATIVE 05/24/2023 1621   HGBUR SMALL (A) 05/24/2023 1621   BILIRUBINUR NEGATIVE 05/24/2023 1621   KETONESUR NEGATIVE 05/24/2023 1621   PROTEINUR NEGATIVE 05/24/2023 1621   UROBILINOGEN 0.2 08/03/2011 1937   NITRITE NEGATIVE 05/24/2023 1621   LEUKOCYTESUR NEGATIVE 05/24/2023 1621    Sepsis Labs: Lactic Acid, Venous    Component Value Date/Time   LATICACIDVEN 2.4 (HH) 05/24/2023  1456    MICROBIOLOGY: No results found for this or any previous visit (from the past 240 hour(s)).  RADIOLOGY STUDIES/RESULTS: No results found.   LOS: 3 days   Jeoffrey Massed, MD  [facility]    To contact the attending provider between 7A-7P or the covering provider during after hours 7P-7A, please log into the web site [email or web address] and access using universal [place] password for that web site. If you do not have the password, please call the hospital operator.  05/27/2023, 9:50 AM

## 2023-05-27 NOTE — Progress Notes (Signed)
   05/27/23 0938  TOC Brief Assessment  Insurance and Status Reviewed [facility] Medicare Dual Complete)  Patient has primary care physician Yes Anna Genre, [name], PA-C)  Home environment has been reviewed From home alone  Prior level of function: independent  uses a moped to get around  friend will drive him to appointments  Prior/Current Home Services Current home services [facility])  Social Determinants of Health Reivew SDOH reviewed no interventions necessary  Readmission risk has been reviewed Yes (15 %  Patient is a readmission from [facility] earlier this month)  Transition of care needs transition of care needs identified, TOC will continue to follow

## 2023-05-27 NOTE — Care Management Important Message (Signed)
Important Message  Patient Details  Name: Jeffrey Randolph MRN: [id number] Date of Birth: Nov 18, 1955   Medicare Important Message Given:  Yes     Nakai Pollio Stefan Church 05/27/2023, 1:33 PM

## 2023-05-27 NOTE — NC FL2 (Signed)
[place] MEDICAID FL2 LEVEL OF CARE FORM     IDENTIFICATION  Patient Name: Jeffrey Randolph Birthdate: August 10, 1956 Sex: male Admission Date (Current Location): 05/24/2023  [facility] and [place] Number:  Producer, television/film/video and Address:  [facility] [place], [address] [place], [place] [address]      Provider Number: [id number]  Attending Physician Name and Address:  Maretta Bees, MD  Relative Name and Phone Number:  Liliana Cline [phone])  [phone] [facility])    Current Level of Care: Hospital Recommended Level of Care: Skilled Nursing Facility Prior Approval Number:    Date Approved/Denied:   PASRR Number: [id number]  Discharge Plan: SNF    Current Diagnoses: Patient Active Problem List   Diagnosis Date Noted   Hyponatremia 05/08/2023   History of alcohol abuse 05/08/2023   Diarrhea 05/08/2023   Weakness 05/08/2023   Ascitic fluid 03/20/2022   Hepatic cirrhosis (HCC) 03/20/2022   Atrial fibrillation, chronic (HCC) 03/20/2022   Fall at home, initial encounter 03/20/2022   Chest pain 03/20/2022   Secondary hypercoagulable state (HCC) 10/20/2019   Hypertension 05/26/2019   Hypokalemia 05/26/2019   History of stroke 05/26/2019   Hypomagnesemia 05/26/2019   Lumbar spondylosis 08/14/2015   Spinal stenosis of lumbar region 04/20/2015   HNP (herniated nucleus pulposus), lumbar 01/09/2015   Displacement of lumbar intervertebral disc without myelopathy 12/08/2014    Orientation RESPIRATION BLADDER Height & Weight     Self, Time, Situation, Place  Normal Incontinent Weight: 175 lb (79.4 kg) Height:  5\' 9"  (175.3 cm)  BEHAVIORAL SYMPTOMS/MOOD NEUROLOGICAL BOWEL NUTRITION STATUS      Continent Diet (see discharge summary)  AMBULATORY STATUS COMMUNICATION OF NEEDS Skin   Limited Assist Verbally Normal                       Personal Care Assistance Level of Assistance  Bathing, Feeding, Dressing Bathing Assistance: Limited assistance Feeding  assistance: Independent Dressing Assistance: Limited assistance     Functional Limitations Info  Sight, Hearing, Speech Sight Info: Adequate Hearing Info: Adequate Speech Info: Adequate    SPECIAL CARE FACTORS FREQUENCY  PT (By licensed PT), OT (By licensed OT)     PT Frequency: 5x/week OT Frequency: 5x/week            Contractures Contractures Info: Not present    Additional Factors Info  Code Status, Allergies Code Status Info: full code Allergies Info: no known allergies           Current Medications (05/27/2023):  This is the current hospital active medication list Current Facility-Administered Medications  Medication Dose Route Frequency Provider Last Rate Last Admin   acetaminophen (TYLENOL) tablet 650 mg  650 mg Oral Q6H PRN Synetta Fail, MD       Or   acetaminophen (TYLENOL) suppository 650 mg  650 mg Rectal Q6H PRN Synetta Fail, MD       albumin human 25 % solution 50 g  50 g Intravenous BID Delano Metz, MD 60 mL/hr at 05/27/23 0813 50 g at 05/27/23 0813   apixaban (ELIQUIS) tablet 5 mg  5 mg Oral BID Synetta Fail, MD   5 mg at 05/27/23 0800   diltiazem (CARDIZEM CD) 24 hr capsule 240 mg  240 mg Oral Daily Synetta Fail, MD   240 mg at 05/27/23 0800   midodrine (PROAMATINE) tablet 10 mg  10 mg Oral TID WC Synetta Fail, MD   10 mg  at 05/27/23 0800   pantoprazole (PROTONIX) EC tablet 40 mg  40 mg Oral Daily Synetta Fail, MD   40 mg at 05/27/23 0800   polyethylene glycol (MIRALAX / GLYCOLAX) packet 17 g  17 g Oral Daily PRN Synetta Fail, MD       pregabalin (LYRICA) capsule 200 mg  200 mg Oral TID Synetta Fail, MD   200 mg at 05/27/23 0800   rOPINIRole (REQUIP) tablet 0.5 mg  0.5 mg Oral QHS Synetta Fail, MD   0.5 mg at 05/26/23 2142   sodium chloride flush (NS) 0.9 % injection 3 mL  3 mL Intravenous Q12H Synetta Fail, MD   3 mL at 05/27/23 0814   urea (URE-NA) oral packet 15 g  15 g Oral BID  Arita Miss, MD         Discharge Medications: Please see discharge summary for a list of discharge medications.  Relevant Imaging Results:  Relevant Lab Results:   Additional Information SSN [id number] [place], [place]

## 2023-05-27 NOTE — Progress Notes (Signed)
Occupational Therapy Treatment Patient Details Name: Jeffrey Randolph MRN: [id number] DOB: 1956/02/21 Today's Date: 05/27/2023   History of present illness Pt is a 67 y.o. male admitted 05/24/23 after feeling weak and falling at [facility]; pt hit his head, denies LOC. Workup for hyponatremia, hypokalemia. Of note, recent admission 05/08/23 with similar issues. Other PMH includes afib, HTN, CVA, hepatic cirrhosis.   OT comments  Pt in good spirits, eager to participate and improve LB strength/stability. Pt worried about falling due to legs giving out in past, eager to go somewhere to get more therapy and improve functional strength/endurance. Pt tolerated session well, CGA with RW for 468ft, no rest breaks. Pt able to ambulate to restroom CGA, perform toileting as needed. Pt set up for dressing at EOB, some impulsiveness to stand and ambulate when asked to wait for therapist for safety. Pt does not feel safe going home at this time and strongly wants to go somewhere to get rehab prior to returning home alone, I think post acute <3hrs/day is appropriate to maximize safety and stability prior to return home to reduce fall risk and improve safety at home. Pt to be seen acutely to maximize progress.       If plan is discharge home, recommend the following:  A little help with walking and/or transfers;A little help with bathing/dressing/bathroom;Assistance with cooking/housework;Assist for transportation;Help with stairs or ramp for entrance   Equipment Recommendations  BSC/3in1;Other (comment) (defer)    Recommendations for Other Services      Precautions / Restrictions Precautions Precautions: Fall Restrictions Weight Bearing Restrictions: No       Mobility Bed Mobility Overal bed mobility: Modified Independent                  Transfers Overall transfer level: Needs assistance Equipment used: None Transfers: Sit to/from Stand Sit to Stand: Contact guard assist           General  transfer comment: CGA for safety     Balance Overall balance assessment: Needs assistance Sitting-balance support: No upper extremity supported, Feet supported Sitting balance-Leahy Scale: Good Sitting balance - Comments: EOB ADLs   Standing balance support: No upper extremity supported, During functional activity Standing balance-Leahy Scale: Fair Standing balance comment: able to stand unsupported performing ADLs, worried about BLEs giving out and falling                           ADL either performed or assessed with clinical judgement   ADL Overall ADL's : Needs assistance/impaired Eating/Feeding: Independent   Grooming: Contact guard assist;Standing           Upper Body Dressing : Set up;Sitting   Lower Body Dressing: Contact guard assist;Sit to/from stand   Toilet Transfer: Contact guard assist;Ambulation           Functional mobility during ADLs: Contact guard assist General ADL Comments: Pt doing well, able to perform dressing with set up/CGA, ambulation 400 feet CGA, no rest break needed    Extremity/Trunk Assessment Upper Extremity Assessment Upper Extremity Assessment: Overall WFL for tasks assessed            Vision       Perception     Praxis      Cognition   Behavior During Therapy: [facility] for tasks assessed/performed Overall Cognitive Status: Impaired/Different from baseline  General Comments: WFL for simple tasks, not formally assessed        Exercises      Shoulder Instructions       General Comments      Pertinent Vitals/ Pain       Pain Assessment Pain Assessment: No/denies pain  Home Living                                          Prior Functioning/Environment              Frequency  Min 1X/week        Progress Toward Goals  OT Goals(current goals can now be found in the care plan section)  Progress towards OT goals: Progressing  toward goals  Acute Rehab OT Goals Patient Stated Goal: to improve BLE strength/stability prior to returning home OT Goal Formulation: With patient Time For Goal Achievement: 06/08/23 Potential to Achieve Goals: Good ADL Goals Pt Will Perform Grooming: with modified independence;standing Pt Will Perform Lower Body Dressing: with modified independence;sit to/from stand Pt Will Transfer to Toilet: with modified independence;ambulating;bedside commode Pt Will Perform Toileting - Clothing Manipulation and hygiene: with modified independence;sit to/from stand Pt Will Perform Tub/Shower Transfer: Tub transfer;with modified independence;shower seat;ambulating;3 in 1;rolling walker Additional ADL Goal #1: Pt will verbalize 3 fall prevention techniques to optimize independence and safety at home.  Plan Discharge plan needs to be updated    Co-evaluation                 AM-PAC OT "6 Clicks" Daily Activity     Outcome Measure   Help from another person eating meals?: None Help from another person taking care of personal grooming?: A Little Help from another person toileting, which includes using toliet, bedpan, or urinal?: A Little Help from another person bathing (including washing, rinsing, drying)?: A Little Help from another person to put on and taking off regular upper body clothing?: A Little Help from another person to put on and taking off regular lower body clothing?: A Little 6 Click Score: 19    End of Session Equipment Utilized During Treatment: Gait belt;Rolling walker (2 wheels)  OT Visit Diagnosis: Unsteadiness on feet (R26.81)   Activity Tolerance Patient tolerated treatment well   Patient Left in bed;with call bell/phone within reach   Nurse Communication Mobility status        Time: 1610-9604 OT Time Calculation (min): 24 min  Charges: OT General Charges $OT Visit: 1 Visit OT Treatments $Self Care/Home Management : 8-22 mins $Therapeutic Activity: 8-22  mins  Gabbriella Presswood, OTR/L   Alexis Goodell 05/27/2023, 3:51 PM

## 2023-05-27 NOTE — TOC Progression Note (Signed)
Transition of Care [facility]) - Progression Note    Patient Details  Name: Jeffrey Randolph MRN: [id number] Date of Birth: [birth year]  Transition of Care [facility]) CM/SW Contact  Erin Sons, [place] Phone Number: 05/27/2023, 1:11 PM  Clinical Narrative:     CSW met pt to discuss SNF rec. Pt lives alone and states he does not have support at home. He is interested in SNF for rehab. CSW explained SNF and insurance auth process. Pt agreeable to SNF w/u and reports preference for [facility]. Fl2 completed and bed requests sent in the hub.   1300: Jeffrey Randolph confirmed bed offer for pt. CSW submitted insurance auth request in online portal. Ref# [id number] Expected Discharge Plan: Skilled Nursing Facility Barriers to Discharge: Insurance Authorization  Expected Discharge Plan and Services                                               Social Determinants of Health (SDOH) Interventions SDOH Screenings   Food Insecurity: No Food Insecurity (05/25/2023)  Housing: Low Risk  (05/25/2023)  Transportation Needs: No Transportation Needs (05/25/2023)  Utilities: Not At Risk (05/25/2023)  Tobacco Use: Medium Risk (05/24/2023)    Readmission Risk Interventions    05/09/2023    2:37 PM  Readmission Risk Prevention Plan  Post Dischage Appt Complete  Medication Screening Complete  Transportation Screening Complete

## 2023-05-27 NOTE — Plan of Care (Signed)
  Problem: Education: Goal: Knowledge of General Education information will improve Description: Including pain rating scale, medication(s)/side effects and non-pharmacologic comfort measures Outcome: Progressing   Problem: Clinical Measurements: Goal: Ability to maintain clinical measurements within normal limits will improve Outcome: Progressing Goal: Diagnostic test results will improve Outcome: Progressing   

## 2023-05-27 NOTE — Progress Notes (Signed)
Admit: 05/24/2023 LOS: 3  54M with recurrent hypotonic hyponatremia; hx/o cirrhosis and HFpEF  Subjective:  SNa stuck at 125 today Still taking in quite a bit of fluids BUN still ver ylow No HA, vision changes, N/V  08/19 0701 - 08/20 0700 In: -  Out: 200 [Urine:200]  Filed Weights   05/24/23 1432  Weight: 79.4 kg    Scheduled Meds:  apixaban  5 mg Oral BID   diltiazem  240 mg Oral Daily   midodrine  10 mg Oral TID WC   pantoprazole  40 mg Oral Daily   pregabalin  200 mg Oral TID   rOPINIRole  0.5 mg Oral QHS   sodium chloride flush  3 mL Intravenous Q12H   urea  15 g Oral BID   Continuous Infusions:  albumin human 50 g (05/27/23 0813)   PRN Meds:.acetaminophen **OR** acetaminophen, polyethylene glycol  Current Labs: reviewed   Latest Reference Range & Units 05/25/23 17:27  Osmolality, Urine 300 - 900 mOsm/kg 337  Sodium, Urine mmol/L <10    Latest Reference Range & Units 05/24/23 22:13  Osmolality 275 - 295 mOsm/kg 255 (L)  (L): Data is abnormally low  Physical Exam:  Blood pressure (!) 118/51, pulse (!) 57, temperature 98 F (36.7 C), temperature source Oral, resp. rate 16, height 5\' 9"  (1.753 m), weight 79.4 kg, SpO2 95%. NAD, elderly male RRR CTAB S/nt/nd No sig LEE Nonfocal, CN2-12 intact  A Hypotonic hyponatremia: vol status seems euvolemic.  Ithink this is probably cirrhotic physiology and free water in excess of solute exretion.  Needs fluid restriction, good PO (protein).  Na tabs not a great idea with cirrhosis.   Cirrhosis not overtly with ascites or edema; Alb ok.    P Cont fluid restriction 1.2L Add on BID urea today Push protein Daily weights, Daily Renal Panel, Strict I/Os   Sabra Heck MD 05/27/2023, 10:49 AM  Recent Labs  Lab 05/25/23 1724 05/26/23 0253 05/27/23 0122  NA 123* 126* 125*  K 3.6 3.7 3.5  CL 88* 94* 94*  CO2 24 24 22   GLUCOSE 101* 101* 106*  BUN <5* <5* 5*  CREATININE 0.65 0.54* 0.54*  CALCIUM 9.3 9.5 9.6    Recent Labs  Lab 05/24/23 1445 05/24/23 1456 05/25/23 0242 05/26/23 0253  WBC 5.4  --  4.8 3.6*  HGB 11.4* 12.6* 11.4* 10.9*  HCT 32.1* 37.0* 34.2* 31.4*  MCV 97.3  --  103.0* 99.4  PLT 194  --  150 144*

## 2023-05-28 DIAGNOSIS — E871 Hypo-osmolality and hyponatremia: Secondary | ICD-10-CM | POA: Diagnosis not present

## 2023-05-28 DIAGNOSIS — I482 Chronic atrial fibrillation, unspecified: Secondary | ICD-10-CM | POA: Diagnosis not present

## 2023-05-28 DIAGNOSIS — I10 Essential (primary) hypertension: Secondary | ICD-10-CM | POA: Diagnosis not present

## 2023-05-28 DIAGNOSIS — K7460 Unspecified cirrhosis of liver: Secondary | ICD-10-CM | POA: Diagnosis not present

## 2023-05-28 LAB — BASIC METABOLIC PANEL
Anion gap: 11 (ref 5–15)
BUN: 18 mg/dL (ref 8–23)
CO2: 20 mmol/L — ABNORMAL LOW (ref 22–32)
Calcium: 9.9 mg/dL (ref 8.9–10.3)
Chloride: 96 mmol/L — ABNORMAL LOW (ref 98–111)
Creatinine, Ser: 0.58 mg/dL — ABNORMAL LOW (ref 0.61–1.24)
GFR, Estimated: >60 mL/min (ref >60)
Glucose, Bld: 120 mg/dL — ABNORMAL HIGH (ref 70–99)
Potassium: 3.4 mmol/L — ABNORMAL LOW (ref 3.5–5.1)
Sodium: 127 mmol/L — ABNORMAL LOW (ref 135–145)

## 2023-05-28 MED ORDER — PREGABALIN 200 MG PO CAPS: 200.0000 mg | ORAL_CAPSULE | Freq: Three times a day (TID) | ORAL | 0 refills | Status: DC

## 2023-05-28 MED ORDER — UREA 15 G PO PACK: 15.0000 g | PACK | Freq: Two times a day (BID) | ORAL | Status: DC

## 2023-05-28 MED ORDER — POTASSIUM CHLORIDE CRYS ER 20 MEQ PO TBCR
40.0000 meq | EXTENDED_RELEASE_TABLET | Freq: Once | ORAL | Status: AC
  Administered 2023-05-28: 40 meq via ORAL
  Filled 2023-05-28: qty 2

## 2023-05-28 MED ORDER — MIDODRINE HCL 10 MG PO TABS: 10.0000 mg | ORAL_TABLET | Freq: Three times a day (TID) | ORAL | Status: DC

## 2023-05-28 NOTE — Progress Notes (Signed)
Physical Therapy Treatment Patient Details Name: Jeffrey Randolph MRN: [id number] DOB: 06-17-1956 Today's Date: 05/28/2023   History of Present Illness Pt is a 67 y.o. male admitted 05/24/23 after feeling weak and falling at [facility]; pt hit his head, denies LOC. Workup for hyponatremia, hypokalemia. Of note, recent admission 05/08/23 with similar issues. Other PMH includes afib, HTN, CVA, hepatic cirrhosis.    PT Comments  Patient progressing and needing some cues and CGA for balance due to flexed posture and poor management of walker especially on turns.  Patient  remains hopeful for transition to inpatient rehab prior to d/c home as lives alone and multiple recent falls and continued fall risk.    If plan is discharge home, recommend the following: A little help with bathing/dressing/bathroom;Assistance with cooking/housework;Assist for transportation;A little help with walking and/or transfers   Can travel by private vehicle     Yes  Equipment Recommendations  None recommended by PT    Recommendations for Other Services       Precautions / Restrictions Precautions Precautions: Fall     Mobility  Bed Mobility Overal bed mobility: Needs Assistance Bed Mobility: Supine to Sit       Sit to supine: Modified independent (Device/Increase time)        Transfers Overall transfer level: Needs assistance Equipment used: Rolling walker (2 wheels) Transfers: Sit to/from Stand Sit to Stand: Contact guard assist           General transfer comment: up to stand from EOB to RW, CGA for safety and cues for hand placement    Ambulation/Gait Ambulation/Gait assistance: Contact guard assist, Min assist Gait Distance (Feet): 400 Feet Assistive device: Rolling walker (2 wheels) Gait Pattern/deviations: Step-through pattern, Decreased stride length, Shuffle, Trunk flexed, Drifts right/left       General Gait Details: using RW though on turns getting out of BOS of walker and needing  cues, flexed posture, working with cues and therex to extend trunk and cues for upright posture   Stairs             Wheelchair Mobility     Tilt Bed    Modified Rankin (Stroke Patients Only)       Balance Overall balance assessment: Needs assistance Sitting-balance support: Feet supported Sitting balance-Leahy Scale: Good     Standing balance support: No upper extremity supported Standing balance-Leahy Scale: Fair Standing balance comment: standing in hallway to use hand sanitizer no UE support, in room waiting for PT to adjust chair without UE support though cues for holding on               High Level Balance Comments: standing reaching up wall x 5 with bilat Ue's for upper trunk extension, standing marching with hands on wall rail x 10            Cognition Arousal: Alert Behavior During Therapy: WFL for tasks assessed/performed Overall Cognitive Status: Impaired/Different from baseline Area of Impairment: Problem solving                             Problem Solving: Slow processing, Requires verbal cues General Comments: WFL for simple tasks, has had falls though not holding walker in room after PT asked him to hold while PT adjusting chair        Exercises      General Comments General comments (skin integrity, edema, etc.): VSS on RA; noted tray when returned to room so  set up lunch tray and ended session pt in chair      Pertinent Vitals/Pain Pain Assessment Pain Assessment: No/denies pain    Home Living                          Prior Function            PT Goals (current goals can now be found in the care plan section) Progress towards PT goals: Progressing toward goals    Frequency    Min 1X/week      PT Plan Current plan remains appropriate    Co-evaluation              AM-PAC PT "6 Clicks" Mobility   Outcome Measure  Help needed turning from your back to your side while in a flat bed  without using bedrails?: None Help needed moving from lying on your back to sitting on the side of a flat bed without using bedrails?: None Help needed moving to and from a bed to a chair (including a wheelchair)?: None Help needed standing up from a chair using your arms (e.g., wheelchair or bedside chair)?: None     6 Click Score: 16    End of Session Equipment Utilized During Treatment: Gait belt Activity Tolerance: Patient tolerated treatment well Patient left: in chair;with call bell/phone within reach;with chair alarm set   PT Visit Diagnosis: Unsteadiness on feet (R26.81);Muscle weakness (generalized) (M62.81);History of falling (Z91.81);Repeated falls (R29.6)     Time: 4403-4742 PT Time Calculation (min) (ACUTE ONLY): 20 min  Charges:    $Gait Training: 8-22 mins PT General Charges $$ ACUTE PT VISIT: 1 Visit                     Sheran Lawless, PT Acute Rehabilitation Services [phone] 05/28/2023    Jeffrey Randolph 05/28/2023, 4:37 PM

## 2023-05-28 NOTE — Progress Notes (Signed)
Admit: 05/24/2023 LOS: 4  64M with recurrent hypotonic hyponatremia; hx/o cirrhosis and HFpEF  Subjective:  SNa improved to 127 Now on urea, BUN 18 No c/o or needs. No AMS, N/V, HA  08/20 0701 - 08/21 0700 In: 240 [P.O.:240] Out: 850 [Urine:850]  Filed Weights   05/24/23 1432 05/28/23 0448  Weight: 79.4 kg 77.2 kg    Scheduled Meds:  apixaban  5 mg Oral BID   diltiazem  240 mg Oral Daily   midodrine  10 mg Oral TID WC   pantoprazole  40 mg Oral Daily   pregabalin  200 mg Oral TID   rOPINIRole  0.5 mg Oral QHS   sodium chloride flush  3 mL Intravenous Q12H   urea  15 g Oral BID   Continuous Infusions:   PRN Meds:.acetaminophen **OR** acetaminophen, polyethylene glycol  Current Labs: reviewed   Latest Reference Range & Units 05/25/23 17:27  Osmolality, Urine 300 - 900 mOsm/kg 337  Sodium, Urine mmol/L <10    Latest Reference Range & Units 05/24/23 22:13  Osmolality 275 - 295 mOsm/kg 255 (L)  (L): Data is abnormally low  Physical Exam:  Blood pressure 133/67, pulse 76, temperature 98.6 F (37 C), temperature source Oral, resp. rate 20, height 5\' 9"  (1.753 m), weight 77.2 kg, SpO2 97%. NAD, elderly male RRR CTAB S/nt/nd No sig LEE Nonfocal, CN2-12 intact  A Hypotonic hyponatremia: vol status seems euvolemic.  Ithink this is probably cirrhotic physiology and free water in excess of solute exretion.  Needs ongoing fluid restriction, good PO (protein).  Na tabs not a great idea with cirrhosis.   Cirrhosis not overtly with ascites or edema; Alb ok.    P Cont fluid restriction 1.2L Cont urea if available at SNF Cont to push protein OK for DC. WIll arrange f/u at hour office   Sabra Heck MD 05/28/2023, 12:24 PM  Recent Labs  Lab 05/26/23 0253 05/27/23 0122 05/28/23 0123  NA 126* 125* 127*  K 3.7 3.5 3.4*  CL 94* 94* 96*  CO2 24 22 20*  GLUCOSE 101* 106* 120*  BUN <5* 5* 18  CREATININE 0.54* 0.54* 0.58*  CALCIUM 9.5 9.6 9.9   Recent Labs  Lab  05/24/23 1445 05/24/23 1456 05/25/23 0242 05/26/23 0253  WBC 5.4  --  4.8 3.6*  HGB 11.4* 12.6* 11.4* 10.9*  HCT 32.1* 37.0* 34.2* 31.4*  MCV 97.3  --  103.0* 99.4  PLT 194  --  150 144*

## 2023-05-28 NOTE — Discharge Summary (Addendum)
PATIENT DETAILS Name: Jeffrey Randolph Age: 67 y.o. Sex: male Date of Birth: 04/01/1956 MRN: [id number]. Admitting Physician: Synetta Fail, MD JXB:JYNWGN, Aldean Jewett  Admit Date: 05/24/2023 Discharge date: 05/29/2023  Recommendations for Outpatient Follow-up:  Follow up with PCP in 1-2 weeks Please obtain CMP/CBC in one week Ensure follow-up with nephrology  Admitted From:  Home  Disposition: SNF  Addendum-insurance refused SNF-being discharged home with maximum home health services   Discharge Condition: good  CODE STATUS:   Code Status: Full Code   Diet recommendation:  Diet Order             Diet - low sodium heart healthy           Diet regular Room service appropriate? Yes; Fluid consistency: Thin; Fluid restriction: 1200 mL Fluid  Diet effective now                    Brief Summary: Patient is a 67 y.o.  male with history of alcoholic liver cirrhosis-recent hospitalization at [facility] from 8/1-8/5 for hyponatremia (secondary to cirrhosis/HFpEF)-stabilized-discharged with a sodium of 125-presented to the hospital with fatigue/weakness-he was found to have a sodium level of 116-and subsequently admitted to the hospitalist service.   Significant events: 8/1-8/5>> hospitalization at [facility] for hyponatremia. 8/17>> admit to [facility]-weakness/worsening hyponatremia   Significant studies: 8/17>> CT head: No acute abnormalities 8/17>> CT C-spine: No fracture/dislocation 8/17>> CT T-spine: No fracture/dislocation 8/17 CT L-spine: No active/dislocation 8/17>> CT chest/abdomen/pelvis: No traumatic injury.   Significant microbiology data: None   Procedures: None   Consults: Nephrology  Brief Hospital Course: Hyponatremia Volume status stable Suspect etiology is multifactorial but with significant component of excess of free water/tea toast diet  Sodium levels have stabilized with supportive care but plateaued around 125 range-subsequently started on urea per  nephrology-sodium levels continue to improve-up to 127 today. Needs strict fluid restriction-1200 cc a day Continue urea Repeat electrolytes in 1 week at [facility] Nephrology will arrange for follow-up post discharge. Since no evidence of volume overload-nephrology continues to recommend not initiating diuretic regimen at this point.   Alcoholic liver cirrhosis Compensated   HFpEF Euvolemic Monitor off diuretics for now   PAF Sinus rhythm Cardizem/Eliquis   History of CVA Nonfocal exam Eliquis   HTN BP stable Cardizem   Chronic pain/neuropathy Lyrica/Requip   Debility/deconditioning Frequent falls-reevaluated by PT on 8/19-recommendations are for SNF.   BMI: Estimated body mass index is 25.84 kg/m as calculated from the following:   Height as of this encounter: 5\' 9"  (1.753 m).   Weight as of this encounter: 79.4 kg.    Discharge Diagnoses:  Principal Problem:   Hyponatremia Active Problems:   Hepatic cirrhosis (HCC)   Hypertension   Hypokalemia   History of stroke   Hypomagnesemia   Atrial fibrillation, chronic (HCC)   Spinal stenosis of lumbar region   History of alcohol abuse   Discharge Instructions:  Activity:  As tolerated with Full fall precautions use walker/cane & assistance as needed  Discharge Instructions     Call MD for:  extreme fatigue   Complete by: As directed    Call MD for:  persistant dizziness or light-headedness   Complete by: As directed    Diet - low sodium heart healthy   Complete by: As directed    Discharge instructions   Complete by: As directed    Follow with Primary MD  Lonie Peak, PA-C in 1-2 weeks  Nephrology office will call you with a  follow-up appointment-if do not hear from them-please give them a call.  Please get a complete blood count and chemistry panel checked by your Primary MD at your next visit, and again as instructed by your Primary MD.  Get Medicines reviewed and adjusted: Please take all your  medications with you for your next visit with your Primary MD  Laboratory/radiological data: Please request your Primary MD to go over all hospital tests and procedure/radiological results at the follow up, please ask your Primary MD to get all Hospital records sent to his/her office.  In some cases, they will be blood work, cultures and biopsy results pending at the time of your discharge. Please request that your primary care M.D. follows up on these results.  Also Note the following: If you experience worsening of your admission symptoms, develop shortness of breath, life threatening emergency, suicidal or homicidal thoughts you must seek medical attention immediately by calling 911 or calling your MD immediately  if symptoms less severe.  You must read complete instructions/literature along with all the possible adverse reactions/side effects for all the Medicines you take and that have been prescribed to you. Take any new Medicines after you have completely understood and accpet all the possible adverse reactions/side effects.   Do not drive when taking Pain medications or sleeping medications (Benzodaizepines)  Do not take more than prescribed Pain, Sleep and Anxiety Medications. It is not advisable to combine anxiety,sleep and pain medications without talking with your primary care practitioner  Special Instructions: If you have smoked or chewed Tobacco  in the last 2 yrs please stop smoking, stop any regular Alcohol  and or any Recreational drug use.  Wear Seat belts while driving.  Please note: You were cared for by a hospitalist during your hospital stay. Once you are discharged, your primary care physician will handle any further medical issues. Please note that NO REFILLS for any discharge medications will be authorized once you are discharged, as it is imperative that you return to your primary care physician (or establish a relationship with a primary care physician if you do not  have one) for your post hospital discharge needs so that they can reassess your need for medications and monitor your lab values.   Increase activity slowly   Complete by: As directed    No wound care   Complete by: As directed       Allergies as of 05/29/2023   No Known Allergies      Medication List     STOP taking these medications    furosemide 40 MG tablet Commonly known as: LASIX   sodium bicarbonate 325 MG tablet   sodium chloride 1 g tablet   spironolactone 25 MG tablet Commonly known as: ALDACTONE       TAKE these medications    apixaban 5 MG Tabs tablet Commonly known as: ELIQUIS Take 1 tablet (5 mg total) by mouth 2 (two) times daily for 30 days.   diltiazem 240 MG 24 hr capsule Commonly known as: CARDIZEM CD Take 1 capsule (240 mg total) by mouth daily.   DULoxetine 30 MG capsule Commonly known as: CYMBALTA Take 30 mg by mouth daily.   lactose free nutrition Liqd Take 1 Container by mouth 2 (two) times daily.   midodrine 5 MG tablet Commonly known as: PROAMATINE Take 1 tablet (5 mg total) by mouth 3 (three) times daily with meals.   omeprazole 40 MG capsule Commonly known as: PRILOSEC Take 40 mg by mouth  daily as needed (heartburn).   pregabalin 200 MG capsule Commonly known as: LYRICA Take 1 capsule (200 mg total) by mouth 3 (three) times daily. What changed:  how much to take when to take this   rOPINIRole 0.5 MG tablet Commonly known as: REQUIP Take 0.5 mg by mouth daily as needed (restless legs).   urea 15 g Pack oral packet Commonly known as: URE-NA Take 15 g by mouth 2 (two) times daily.        Follow-up Information     Lonie Peak, PA-C. Schedule an appointment as soon as possible for a visit in 1 week(s).   Specialty: Physician Assistant Contact information: [address] [place] [address] [phone]         Arita Miss, MD Follow up.   Specialty: Nephrology Why: Hospital follow up, Office will  call with date/time, If you dont hear from them,please give them a call Contact information: [address] [place] [address] [phone]                No Known Allergies   Other Procedures/Studies: DG Pelvis Portable  Result Date: 05/24/2023 CLINICAL DATA:  Fall. EXAM: PORTABLE PELVIS 1-2 VIEWS COMPARISON:  None Available. FINDINGS: Minimal osteoarthritic change of the hips. No acute fracture or dislocation. Contrast within the bladder. Degenerative change of the spine. IMPRESSION: 1. No acute findings. 2. Minimal osteoarthritic change of the hips. Electronically Signed   By: Elberta Fortis M.D.   On: 05/24/2023 16:00   DG Chest Port 1 View  Result Date: 05/24/2023 CLINICAL DATA:  Fall. EXAM: PORTABLE CHEST 1 VIEW COMPARISON:  03/20/2022 FINDINGS: Lungs are adequately inflated with mild linear density over the right midlung likely atelectasis. Subtle no pleural effusion or pneumothorax. Cardiomediastinal silhouette is normal. No evidence of fracture. IMPRESSION: Mild linear density over the right midlung likely atelectasis. Electronically Signed   By: Elberta Fortis M.D.   On: 05/24/2023 15:59   CT CHEST ABDOMEN PELVIS W CONTRAST  Result Date: 05/24/2023 CLINICAL DATA:  Polytrauma, blunt, fall at restaurant today with head injury, anticoagulated for atrial fibrillation EXAM: CT CHEST, ABDOMEN, AND PELVIS WITH CONTRAST TECHNIQUE: Multidetector CT imaging of the chest, abdomen and pelvis was performed following the standard protocol during bolus administration of intravenous contrast. RADIATION DOSE REDUCTION: This exam was performed according to the departmental dose-optimization program which includes automated exposure control, adjustment of the mA and/or kV according to patient size and/or use of iterative reconstruction technique. CONTRAST:  75mL OMNIPAQUE IOHEXOL 350 MG/ML SOLN COMPARISON:  03/20/2022 CT chest, abdomen and pelvis FINDINGS: CT CHEST FINDINGS Cardiovascular: Normal  heart size. No significant pericardial fluid/thickening. Left anterior descending coronary atherosclerosis. Atherosclerotic nonaneurysmal thoracic aorta. Normal caliber pulmonary arteries. No evidence of acute thoracic aortic injury. No central pulmonary emboli. Mediastinum/Nodes: No pneumomediastinum. No mediastinal hematoma. No discrete thyroid nodules. Unremarkable esophagus. No axillary, mediastinal or hilar lymphadenopathy. Lungs/Pleura: No pneumothorax. No pleural effusion. Solid 3 mm right LL pulmonary nodule (series 5/image 105) is stable and considered benign. No acute consolidative airspace disease, lung masses or additional significant pulmonary nodules. Musculoskeletal: No aggressive appearing focal osseous lesions. No acute fracture in the chest. Healed chronic lateral right ninth rib fracture. Mild thoracic spondylosis. Symmetric mild gynecomastia, similar. CT ABDOMEN PELVIS FINDINGS Hepatobiliary: Normal liver with no liver laceration or mass. Cholelithiasis. No biliary ductal dilatation. Pancreas: Normal, with no laceration, mass or duct dilation. Spleen: Normal size. No laceration or mass. Adrenals/Urinary Tract: Normal adrenals. No hydronephrosis. No renal laceration.  No renal mass. Normal bladder. Stomach/Bowel: Grossly normal stomach. Normal caliber small bowel with no small bowel wall thickening. Appendix not discretely visualized. Normal large bowel with no diverticulosis, large bowel wall thickening or pericolonic fat stranding. Vascular/Lymphatic: Atherosclerotic nonaneurysmal abdominal aorta. Patent renal veins. No pathologically enlarged lymph nodes in the abdomen or pelvis. Reproductive: Normal size prostate. Other: No pneumoperitoneum, ascites or focal fluid collection. Musculoskeletal: No aggressive appearing focal osseous lesions. No acute fracture. Marked multilevel lumbar degenerative disc disease. Mild superior L1 vertebral compression deformity is chronic and unchanged. IMPRESSION:  1. No evidence of acute traumatic injury in the chest, abdomen or pelvis. 2. Cholelithiasis. 3. One vessel coronary atherosclerosis. 4. Symmetric mild gynecomastia, similar. Electronically Signed   By: Delbert Phenix M.D.   On: 05/24/2023 15:54   CT T-SPINE NO CHARGE  Result Date: 05/24/2023 CLINICAL DATA:  Trauma, back pain EXAM: CT THORACIC AND LUMBAR SPINE WITH CONTRAST TECHNIQUE: Multiplanar CT images of the thoracic and lumbar spine were reconstructed from contemporary CT of the Chest, Abdomen, and Pelvis. RADIATION DOSE REDUCTION: This exam was performed according to the departmental dose-optimization program which includes automated exposure control, adjustment of the mA and/or kV according to patient size and/or use of iterative reconstruction technique. CONTRAST:  None or No additional COMPARISON:  03/20/2022 FINDINGS: CT THORACIC AND LUMBAR SPINE FINDINGS Alignment: Normal thoracic kyphosis. Normal lumbar lordosis. Vertebral bodies: Unchanged mild superior endplate wedge deformity of L1, with no greater than 20% anterior height loss. Vertebral bodies otherwise intact. No other fracture or dislocation. Disc spaces: Generally mild disc space height loss and osteophytosis of the cervical spine, focally severe at L2-L3. Paraspinous soft tissues: Coronary artery calcifications. Gallstones. Aortic atherosclerosis. IMPRESSION: 1. No acute fracture or dislocation of the thoracic or lumbar spine. 2. Unchanged mild superior endplate wedge deformity of L1, with no greater than 20% anterior height loss. 3. Coronary artery disease. 4. Cholelithiasis. Aortic Atherosclerosis (ICD10-I70.0). Electronically Signed   By: Jearld Lesch M.D.   On: 05/24/2023 15:52   CT L-SPINE NO CHARGE  Result Date: 05/24/2023 CLINICAL DATA:  Trauma, back pain EXAM: CT THORACIC AND LUMBAR SPINE WITH CONTRAST TECHNIQUE: Multiplanar CT images of the thoracic and lumbar spine were reconstructed from contemporary CT of the Chest, Abdomen,  and Pelvis. RADIATION DOSE REDUCTION: This exam was performed according to the departmental dose-optimization program which includes automated exposure control, adjustment of the mA and/or kV according to patient size and/or use of iterative reconstruction technique. CONTRAST:  None or No additional COMPARISON:  03/20/2022 FINDINGS: CT THORACIC AND LUMBAR SPINE FINDINGS Alignment: Normal thoracic kyphosis. Normal lumbar lordosis. Vertebral bodies: Unchanged mild superior endplate wedge deformity of L1, with no greater than 20% anterior height loss. Vertebral bodies otherwise intact. No other fracture or dislocation. Disc spaces: Generally mild disc space height loss and osteophytosis of the cervical spine, focally severe at L2-L3. Paraspinous soft tissues: Coronary artery calcifications. Gallstones. Aortic atherosclerosis. IMPRESSION: 1. No acute fracture or dislocation of the thoracic or lumbar spine. 2. Unchanged mild superior endplate wedge deformity of L1, with no greater than 20% anterior height loss. 3. Coronary artery disease. 4. Cholelithiasis. Aortic Atherosclerosis (ICD10-I70.0). Electronically Signed   By: Jearld Lesch M.D.   On: 05/24/2023 15:52   CT HEAD WO CONTRAST  Result Date: 05/24/2023 CLINICAL DATA:  Poly trauma, blunt EXAM: CT HEAD WITHOUT CONTRAST CT CERVICAL SPINE WITHOUT CONTRAST TECHNIQUE: Multidetector CT imaging of the head and cervical spine was performed following the standard protocol without intravenous contrast. Multiplanar CT  image reconstructions of the cervical spine were also generated. RADIATION DOSE REDUCTION: This exam was performed according to the departmental dose-optimization program which includes automated exposure control, adjustment of the mA and/or kV according to patient size and/or use of iterative reconstruction technique. COMPARISON:  None Available. FINDINGS: CT HEAD FINDINGS Brain: No evidence of acute infarction, hemorrhage, extra-axial collection,  ventriculomegaly, or mass effect. Generalized cerebral atrophy. Periventricular white matter low attenuation likely secondary to microangiopathy. Vascular: Cerebrovascular atherosclerotic calcifications are noted. No hyperdense vessels. Skull: Negative for fracture or focal lesion. Sinuses/Orbits: Visualized portions of the orbits are unremarkable. Visualized portions of the paranasal sinuses are unremarkable. Visualized portions of the mastoid air cells are unremarkable. Other: None. CT CERVICAL SPINE FINDINGS Alignment: Normal. Skull base and vertebrae: No acute fracture. No primary bone lesion or focal pathologic process. Soft tissues and spinal canal: No prevertebral fluid or swelling. No visible canal hematoma. Disc levels: Degenerative disease with disc height loss C3-4. Mild broad-based disc osteophyte complex at C3-4 with bilateral uncovertebral degenerative changes and bilateral foraminal stenosis. Mild bilateral facet arthropathy at C3-4. At C4-5 there is a broad central disc protrusion and left foraminal stenosis. At C5-6 there is a mild broad-based disc bulge. At C6-7 there is a right foraminal disc protrusion. Upper chest: Lung apices are clear. Other: No fluid collection or hematoma. Bilateral carotid artery atherosclerosis. IMPRESSION: 1. No acute intracranial pathology. 2. No acute osseous injury of the cervical spine. 3. Cervical spine spondylosis as described above. Electronically Signed   By: Elige Ko M.D.   On: 05/24/2023 15:48   CT CERVICAL SPINE WO CONTRAST  Result Date: 05/24/2023 CLINICAL DATA:  Poly trauma, blunt EXAM: CT HEAD WITHOUT CONTRAST CT CERVICAL SPINE WITHOUT CONTRAST TECHNIQUE: Multidetector CT imaging of the head and cervical spine was performed following the standard protocol without intravenous contrast. Multiplanar CT image reconstructions of the cervical spine were also generated. RADIATION DOSE REDUCTION: This exam was performed according to the departmental  dose-optimization program which includes automated exposure control, adjustment of the mA and/or kV according to patient size and/or use of iterative reconstruction technique. COMPARISON:  None Available. FINDINGS: CT HEAD FINDINGS Brain: No evidence of acute infarction, hemorrhage, extra-axial collection, ventriculomegaly, or mass effect. Generalized cerebral atrophy. Periventricular white matter low attenuation likely secondary to microangiopathy. Vascular: Cerebrovascular atherosclerotic calcifications are noted. No hyperdense vessels. Skull: Negative for fracture or focal lesion. Sinuses/Orbits: Visualized portions of the orbits are unremarkable. Visualized portions of the paranasal sinuses are unremarkable. Visualized portions of the mastoid air cells are unremarkable. Other: None. CT CERVICAL SPINE FINDINGS Alignment: Normal. Skull base and vertebrae: No acute fracture. No primary bone lesion or focal pathologic process. Soft tissues and spinal canal: No prevertebral fluid or swelling. No visible canal hematoma. Disc levels: Degenerative disease with disc height loss C3-4. Mild broad-based disc osteophyte complex at C3-4 with bilateral uncovertebral degenerative changes and bilateral foraminal stenosis. Mild bilateral facet arthropathy at C3-4. At C4-5 there is a broad central disc protrusion and left foraminal stenosis. At C5-6 there is a mild broad-based disc bulge. At C6-7 there is a right foraminal disc protrusion. Upper chest: Lung apices are clear. Other: No fluid collection or hematoma. Bilateral carotid artery atherosclerosis. IMPRESSION: 1. No acute intracranial pathology. 2. No acute osseous injury of the cervical spine. 3. Cervical spine spondylosis as described above. Electronically Signed   By: Elige Ko M.D.   On: 05/24/2023 15:48   US Abdomen Limited RUQ (LIVER/GB)  Result Date: 05/09/2023 CLINICAL DATA:  Abdominal pain. EXAM: ULTRASOUND ABDOMEN LIMITED RIGHT UPPER QUADRANT COMPARISON:  CT  abdomen pelvis dated 03/20/2022. FINDINGS: Gallbladder: There is sludge and stones in the gallbladder. No gallbladder wall thickening or pericholecystic fluid. Negative sonographic Murphy's sign. Common bile duct: Diameter: 6 mm Liver: There is diffuse increased liver echogenicity with irregular contour in keeping with cirrhosis. Portal vein is patent on color Doppler imaging with normal direction of blood flow towards the liver. Other: Small ascites and partially visualized right pleural effusion. IMPRESSION: 1. Cholelithiasis without sonographic evidence of acute cholecystitis. 2. Cirrhosis. 3. Small ascites and partially visualized right pleural effusion. 4. Patent main portal vein with hepatopetal flow. Electronically Signed   By: Elgie Collard M.D.   On: 05/09/2023 04:03   CT HEAD WO CONTRAST ( )  Result Date: 05/08/2023 CLINICAL DATA:  Head trauma, minor (Age >= 65y) recurrent falls EXAM: CT HEAD WITHOUT CONTRAST TECHNIQUE: Contiguous axial images were obtained from the base of the skull through the vertex without intravenous contrast. RADIATION DOSE REDUCTION: This exam was performed according to the departmental dose-optimization program which includes automated exposure control, adjustment of the mA and/or kV according to patient size and/or use of iterative reconstruction technique. COMPARISON:  CT Head 03/20/22 FINDINGS: Brain: No evidence of acute infarction, hemorrhage, hydrocephalus, extra-axial collection or mass lesion/mass effect. Sequela of mild-to-moderate chronic microvascular ischemic change. Vascular: No hyperdense vessel or unexpected calcification. Skull: Normal. Negative for fracture or focal lesion. Sinuses/Orbits: No middle ear or mastoid effusion. Paranasal sinuses are clear. Orbits are unremarkable. Other: None. IMPRESSION: No acute intracranial abnormality. Electronically Signed   By: Lorenza Cambridge M.D.   On: 05/08/2023 15:05     TODAY-DAY OF DISCHARGE:  Subjective:    Jeffrey Randolph today has no headache,no chest abdominal pain,no new weakness tingling or numbness, feels much better wants to go home today.   Objective:   Blood pressure 126/63, pulse 73, temperature 98.6 F (37 C), temperature source Oral, resp. rate 18, height 5\' 9"  (1.753 m), weight 76.9 kg, SpO2 97%.  Intake/Output Summary (Last 24 hours) at 05/29/2023 0655 Last data filed at 05/28/2023 2300 Gross per 24 hour  Intake 480 ml  Output 200 ml  Net 280 ml   Filed Weights   05/24/23 1432 05/28/23 0448 05/29/23 0500  Weight: 79.4 kg 77.2 kg 76.9 kg    Exam: Awake Alert, Oriented *3, No new F.N deficits, Normal affect .AT,PERRAL Supple Neck,No JVD, No cervical lymphadenopathy appriciated.  Symmetrical Chest wall movement, Good air movement bilaterally, CTAB RRR,No Gallops,Rubs or new Murmurs, No Parasternal Heave +ve B.Sounds, Abd Soft, Non tender, No organomegaly appriciated, No rebound -guarding or rigidity. No Cyanosis, Clubbing or edema, No new Rash or bruise   PERTINENT RADIOLOGIC STUDIES: No results found.   PERTINENT LAB RESULTS: CBC: No results for input(s): "WBC", "HGB", "HCT", "PLT" in the last 72 hours.  CMET CMP     Component Value Date/Time   NA 129 (L) 05/29/2023 0344   NA 133 (L) 11/12/2022 1349   K 3.9 05/29/2023 0344   CL 98 05/29/2023 0344   CO2 22 05/29/2023 0344   GLUCOSE 117 (H) 05/29/2023 0344   BUN 17 05/29/2023 0344   BUN 3 (L) 11/12/2022 1349   CREATININE 0.68 05/29/2023 0344   CALCIUM 9.9 05/29/2023 0344   PROT 7.5 05/26/2023 0253   PROT 7.3 11/12/2022 1349   ALBUMIN 4.9 05/26/2023 0253   ALBUMIN 4.1 11/12/2022 1349   AST 26 05/26/2023 0253   ALT 22 05/26/2023 0253  ALKPHOS 75 05/26/2023 0253   BILITOT 2.5 (H) 05/26/2023 0253   BILITOT 1.4 (H) 11/12/2022 1349   EGFR 108 11/12/2022 1349   GFRNONAA >60 05/29/2023 0344    GFR Estimated Creatinine Clearance: 89.6 mL/min (by C-G formula based on SCr of 0.68 mg/dL). No results for  input(s): "LIPASE", "AMYLASE" in the last 72 hours. No results for input(s): "CKTOTAL", "CKMB", "CKMBINDEX", "TROPONINI" in the last 72 hours. Invalid input(s): "POCBNP" No results for input(s): "DDIMER" in the last 72 hours. No results for input(s): "HGBA1C" in the last 72 hours. No results for input(s): "CHOL", "HDL", "LDLCALC", "TRIG", "CHOLHDL", "LDLDIRECT" in the last 72 hours. No results for input(s): "TSH", "T4TOTAL", "T3FREE", "THYROIDAB" in the last 72 hours.  Invalid input(s): "FREET3" No results for input(s): "VITAMINB12", "FOLATE", "FERRITIN", "TIBC", "IRON", "RETICCTPCT" in the last 72 hours. Coags: No results for input(s): "INR" in the last 72 hours.  Invalid input(s): "PT" Microbiology: No results found for this or any previous visit (from the past 240 hour(s)).  FURTHER DISCHARGE INSTRUCTIONS:  Get Medicines reviewed and adjusted: Please take all your medications with you for your next visit with your Primary MD  Laboratory/radiological data: Please request your Primary MD to go over all hospital tests and procedure/radiological results at the follow up, please ask your Primary MD to get all Hospital records sent to his/her office.  In some cases, they will be blood work, cultures and biopsy results pending at the time of your discharge. Please request that your primary care M.D. goes through all the records of your hospital data and follows up on these results.  Also Note the following: If you experience worsening of your admission symptoms, develop shortness of breath, life threatening emergency, suicidal or homicidal thoughts you must seek medical attention immediately by calling 911 or calling your MD immediately  if symptoms less severe.  You must read complete instructions/literature along with all the possible adverse reactions/side effects for all the Medicines you take and that have been prescribed to you. Take any new Medicines after you have completely  understood and accpet all the possible adverse reactions/side effects.   Do not drive when taking Pain medications or sleeping medications (Benzodaizepines)  Do not take more than prescribed Pain, Sleep and Anxiety Medications. It is not advisable to combine anxiety,sleep and pain medications without talking with your primary care practitioner  Special Instructions: If you have smoked or chewed Tobacco  in the last 2 yrs please stop smoking, stop any regular Alcohol  and or any Recreational drug use.  Wear Seat belts while driving.  Please note: You were cared for by a hospitalist during your hospital stay. Once you are discharged, your primary care physician will handle any further medical issues. Please note that NO REFILLS for any discharge medications will be authorized once you are discharged, as it is imperative that you return to your primary care physician (or establish a relationship with a primary care physician if you do not have one) for your post hospital discharge needs so that they can reassess your need for medications and monitor your lab values.  Total Time spent coordinating discharge including counseling, education and face to face time equals greater than 30 minutes.  SignedJeoffrey Massed 05/29/2023 6:55 AM

## 2023-05-28 NOTE — TOC Progression Note (Signed)
Transition of Care [facility]) - Progression Note    Patient Details  Name: Tanor Martina MRN: [id number] Date of Birth: [birth year]  Transition of Care [facility]) CM/SW Contact  Erin Sons, [place] Phone Number: 05/28/2023, 1:05 PM  Clinical Narrative:     [facility] still pending at this time. TOC continuing to follow.   Expected Discharge Plan: Skilled Nursing Facility Barriers to Discharge: Insurance Authorization  Expected Discharge Plan and Services         Expected Discharge Date: 05/28/23                                     Social Determinants of Health (SDOH) Interventions SDOH Screenings   Food Insecurity: No Food Insecurity (05/25/2023)  Housing: Low Risk  (05/25/2023)  Transportation Needs: No Transportation Needs (05/25/2023)  Utilities: Not At Risk (05/25/2023)  Tobacco Use: Medium Risk (05/24/2023)    Readmission Risk Interventions    05/09/2023    2:37 PM  Readmission Risk Prevention Plan  Post Dischage Appt Complete  Medication Screening Complete  Transportation Screening Complete

## 2023-05-29 ENCOUNTER — Other Ambulatory Visit (HOSPITAL_COMMUNITY): Payer: Self-pay

## 2023-05-29 DIAGNOSIS — E871 Hypo-osmolality and hyponatremia: Secondary | ICD-10-CM | POA: Diagnosis not present

## 2023-05-29 LAB — BASIC METABOLIC PANEL
Anion gap: 9 (ref 5–15)
BUN: 17 mg/dL (ref 8–23)
CO2: 22 mmol/L (ref 22–32)
Calcium: 9.9 mg/dL (ref 8.9–10.3)
Chloride: 98 mmol/L (ref 98–111)
Creatinine, Ser: 0.68 mg/dL (ref 0.61–1.24)
GFR, Estimated: >60 mL/min (ref >60)
Glucose, Bld: 117 mg/dL — ABNORMAL HIGH (ref 70–99)
Potassium: 3.9 mmol/L (ref 3.5–5.1)
Sodium: 129 mmol/L — ABNORMAL LOW (ref 135–145)

## 2023-05-29 MED ORDER — MIDODRINE HCL 5 MG PO TABS
5.0000 mg | ORAL_TABLET | Freq: Three times a day (TID) | ORAL | Status: DC
  Administered 2023-05-29 (×2): 5 mg via ORAL
  Filled 2023-05-29 (×2): qty 1

## 2023-05-29 MED ORDER — UREA 15 G PO PACK: 15.0000 g | PACK | Freq: Two times a day (BID) | ORAL | 2 refills | Status: DC

## 2023-05-29 MED ORDER — MIDODRINE HCL 5 MG PO TABS
5.0000 mg | ORAL_TABLET | Freq: Three times a day (TID) | ORAL | 2 refills | Status: DC
Start: 2023-05-29 — End: 2024-04-15
  Filled 2023-05-29: qty 90, 30d supply, fill #0

## 2023-05-29 MED ORDER — MIDODRINE HCL 5 MG PO TABS: 5.0000 mg | ORAL_TABLET | Freq: Three times a day (TID) | ORAL | Status: DC

## 2023-05-29 MED ORDER — UREA 15 G PO PACK
15.0000 g | PACK | Freq: Two times a day (BID) | ORAL | 2 refills | Status: DC
Start: 2023-05-29 — End: 2023-05-29
  Filled 2023-05-29: qty 60, 30d supply, fill #0

## 2023-05-29 NOTE — Consult Note (Signed)
   [facility] Inpatient Consult   05/29/2023  Elemer Guimond 08-14-1956 [id number]  Follow up:  Insurance denied SNF rehab  Call attempt to patient to offer follow up with and unable to reach at this time, likely due to transitioning to home today.  Plan:  Will make a referral request for post hospital follow up with the [facility] Coordination team for high risk for readmission.  For readmission prevention outreach measure.  Charlesetta Shanks, RN BSN CCM [facility]  [phone] business mobile phone Toll free office [phone]  Fax number: [phone] [email or web address]

## 2023-05-29 NOTE — TOC Progression Note (Signed)
Transition of Care [facility]) - Progression Note    Patient Details  Name: Jeffrey Randolph MRN: [id number] Date of Birth: Nov 10, 1955  [facility]) CM/SW Contact  Thea Holshouser Aris Lot, [place] Phone Number: 05/29/2023, 1:16 PM  Clinical Narrative:     CSW is notified that insurance is offering a peer to peer. CSW provided peer to peer info to MD. MD completed peer to peer and insurance denied SNF auth. Plan is now for pt to DC home.   Expected Discharge Plan: Skilled Nursing Facility Barriers to Discharge: Insurance Authorization  Expected Discharge Plan and Services         Expected Discharge Date: 05/28/23                                     Social Determinants of Health (SDOH) Interventions SDOH Screenings   Food Insecurity: No Food Insecurity (05/25/2023)  Housing: Low Risk  (05/25/2023)  Transportation Needs: No Transportation Needs (05/25/2023)  Utilities: Not At Risk (05/25/2023)  Tobacco Use: Medium Risk (05/24/2023)    Readmission Risk Interventions    05/09/2023    2:37 PM  Readmission Risk Prevention Plan  Post Dischage Appt Complete  Medication Screening Complete  Transportation Screening Complete

## 2023-05-29 NOTE — Progress Notes (Signed)
PROGRESS NOTE        PATIENT DETAILS Name: Jeffrey Randolph Age: 67 y.o. Sex: male Date of Birth: 04-13-1956 Admit Date: 05/24/2023 Admitting Physician Synetta Fail, MD [id number], [name], [place]  Brief Summary: Patient is a 67 y.o.  male with history of alcoholic liver cirrhosis-recent hospitalization at [facility] from 8/1-8/5 for hyponatremia (secondary to cirrhosis/HFpEF)-stabilized-discharged with a sodium of 125-presented to the hospital with fatigue/weakness-he was found to have a sodium level of 116-and subsequently admitted to the hospitalist service.  Significant events: 8/1-8/5>> hospitalization at [facility] for hyponatremia. 8/17>> admit to [facility]-weakness/worsening hyponatremia  Significant studies: 8/17>> CT head: No acute abnormalities 8/17>> CT C-spine: No fracture/dislocation 8/17>> CT T-spine: No fracture/dislocation 8/17 CT L-spine: No active/dislocation 8/17>> CT chest/abdomen/pelvis: No traumatic injury.  Significant microbiology data: None  Procedures: None  Consults: Nephrology  Subjective: No complaints-no major issues overnight.  Awaiting SNF.  Objective: Vitals: Blood pressure 119/82, pulse 82, temperature 98.6 F (37 C), temperature source Oral, resp. rate 20, height 5\' 9"  (1.753 m), weight 76.9 kg, SpO2 97%.   Exam: Awake/alert Abdomen soft nontender Legs: No edema Nonfocal exam  Pertinent Labs/Radiology:    Latest Ref Rng & Units 05/26/2023    2:53 AM 05/25/2023    2:42 AM 05/24/2023    2:56 PM  CBC  WBC 4.0 - 10.5 K/uL 3.6  4.8    Hemoglobin 13.0 - 17.0 g/dL 40.9  81.1  91.4   Hematocrit 39.0 - 52.0 % 31.4  34.2  37.0   Platelets 150 - 400 K/uL 144  150      Lab Results  Component Value Date   NA 129 (L) 05/29/2023   K 3.9 05/29/2023   CL 98 05/29/2023   CO2 22 05/29/2023      Assessment/Plan: Hyponatremia Volume status stable Suspect etiology is multifactorial but with significant component of excess of free  water/tea toast diet  Sodium levels have stabilized-and remains essentially the same-unfortunately even with counseling-he is still drinking excessive amount of water/soda Sodium level improving with urea Continue fluid restriction Awaiting SNF bed-stable for discharge if bed available today.  Alcoholic liver cirrhosis Compensated  HFpEF Euvolemic Monitor off diuretics for now  PAF Sinus rhythm Cardizem/Eliquis  History of CVA Nonfocal exam Eliquis  HTN BP stable Cardizem  Chronic pain/neuropathy Lyrica/Requip  Debility/deconditioning Frequent falls-reevaluated by PT on 8/19-recommendations are for SNF.  BMI: Estimated body mass index is 25.04 kg/m as calculated from the following:   Height as of this encounter: 5\' 9"  (1.753 m).   Weight as of this encounter: 76.9 kg.   Code status:   Code Status: Full Code   DVT Prophylaxis: apixaban (ELIQUIS) tablet 5 mg    Family Communication:  None at bedside  Disposition Plan: Status is: Inpatient Remains inpatient appropriate because: Severity of illness   Planned Discharge Destination: SNF   Diet: Diet Order             Diet - low sodium heart healthy           Diet regular Room service appropriate? Yes; Fluid consistency: Thin; Fluid restriction: 1200 mL Fluid  Diet effective now                     Antimicrobial agents: Anti-infectives (From admission, onward)    None        MEDICATIONS: Scheduled  Meds:  apixaban  5 mg Oral BID   diltiazem  240 mg Oral Daily   midodrine  5 mg Oral TID WC   pantoprazole  40 mg Oral Daily   pregabalin  200 mg Oral TID   rOPINIRole  0.5 mg Oral QHS   sodium chloride flush  3 mL Intravenous Q12H   urea  15 g Oral BID   Continuous Infusions:   PRN Meds:.acetaminophen **OR** acetaminophen, polyethylene glycol   I have personally reviewed following labs and imaging studies  LABORATORY DATA: CBC: Recent Labs  Lab 05/24/23 1445 05/24/23 1456  05/25/23 0242 05/26/23 0253  WBC 5.4  --  4.8 3.6*  HGB 11.4* 12.6* 11.4* 10.9*  HCT 32.1* 37.0* 34.2* 31.4*  MCV 97.3  --  103.0* 99.4  PLT 194  --  150 144*    Basic Metabolic Panel: Recent Labs  Lab 05/24/23 1445 05/24/23 1456 05/25/23 1724 05/26/23 0253 05/27/23 0122 05/28/23 0123 05/29/23 0344  NA 116*   < > 123* 126* 125* 127* 129*  K 3.1*   < > 3.6 3.7 3.5 3.4* 3.9  CL 84*   < > 88* 94* 94* 96* 98  CO2 19*   < > 24 24 22  20* 22  GLUCOSE 126*   < > 101* 101* 106* 120* 117*  BUN <5*   < > <5* <5* 5* 18 17  CREATININE 0.59*   < > 0.65 0.54* 0.54* 0.58* 0.68  CALCIUM 8.6*   < > 9.3 9.5 9.6 9.9 9.9  MG 1.8  --   --  2.0  --   --   --    < > = values in this interval not displayed.    GFR: Estimated Creatinine Clearance: 89.6 mL/min (by C-G formula based on SCr of 0.68 mg/dL).  Liver Function Tests: Recent Labs  Lab 05/24/23 1445 05/26/23 0253  AST 40 26  ALT 23 22  ALKPHOS 74 75  BILITOT 2.2* 2.5*  PROT 6.8 7.5  ALBUMIN 3.8 4.9   No results for input(s): "LIPASE", "AMYLASE" in the last 168 hours. No results for input(s): "AMMONIA" in the last 168 hours.  Coagulation Profile: Recent Labs  Lab 05/24/23 1445  INR 1.8*    Cardiac Enzymes: No results for input(s): "CKTOTAL", "CKMB", "CKMBINDEX", "TROPONINI" in the last 168 hours.  BNP (last 3 results) No results for input(s): "PROBNP" in the last 8760 hours.  Lipid Profile: No results for input(s): "CHOL", "HDL", "LDLCALC", "TRIG", "CHOLHDL", "LDLDIRECT" in the last 72 hours.  Thyroid Function Tests: No results for input(s): "TSH", "T4TOTAL", "FREET4", "T3FREE", "THYROIDAB" in the last 72 hours.  Anemia Panel: No results for input(s): "VITAMINB12", "FOLATE", "FERRITIN", "TIBC", "IRON", "RETICCTPCT" in the last 72 hours.  Urine analysis:    Component Value Date/Time   COLORURINE YELLOW 05/24/2023 1621   APPEARANCEUR CLEAR 05/24/2023 1621   LABSPEC 1.024 05/24/2023 1621   PHURINE 7.0 05/24/2023  1621   GLUCOSEU NEGATIVE 05/24/2023 1621   HGBUR SMALL (A) 05/24/2023 1621   BILIRUBINUR NEGATIVE 05/24/2023 1621   KETONESUR NEGATIVE 05/24/2023 1621   PROTEINUR NEGATIVE 05/24/2023 1621   UROBILINOGEN 0.2 08/03/2011 1937   NITRITE NEGATIVE 05/24/2023 1621   LEUKOCYTESUR NEGATIVE 05/24/2023 1621    Sepsis Labs: Lactic Acid, Venous    Component Value Date/Time   LATICACIDVEN 2.4 (HH) 05/24/2023 1456    MICROBIOLOGY: No results found for this or any previous visit (from the past 240 hour(s)).  RADIOLOGY STUDIES/RESULTS: No results found.   LOS:  5 days   Jeoffrey Massed, MD  [facility]    To contact the attending provider between 7A-7P or the covering provider during after hours 7P-7A, please log into the web site [email or web address] and access using universal [place] password for that web site. If you do not have the password, please call the hospital operator.  05/29/2023, 9:10 AM

## 2023-05-30 ENCOUNTER — Telehealth: Payer: Self-pay | Admitting: *Deleted

## 2023-05-30 ENCOUNTER — Ambulatory Visit: Payer: 59

## 2023-05-30 ENCOUNTER — Telehealth: Payer: Self-pay

## 2023-05-30 NOTE — Patient Outreach (Signed)
  Care Coordination   Follow Up Visit Note   05/30/2023 Name: Jeffrey Randolph MRN: [id number] DOB: 18-Jul-1956  Jeffrey Randolph is a 67 y.o. year old male who sees Lonie Peak, [place] for primary care. I  spoke with Charlesetta Shanks [facility] Liasion  What matters to the patients health and wellness today?  I was informed that patient did not get home health orders placed prior to discharge. I will notify PCP as patient has follow up with PCP on 06/02/2023.       SDOH assessments and interventions completed:  No     Care Coordination Interventions:  Yes, provided    Interventions Today    Flowsheet Row Most Recent Value  General Interventions   General Interventions Discussed/Reviewed Communication with  [message routed to PCP to order home health]       Follow up plan:  message routed to MD     Encounter Outcome:  Pt. Visit Completed   Rowe Pavy, RN, BSN, CEN [facility] Coordinator [phone]

## 2023-05-30 NOTE — Patient Outreach (Signed)
  Care Coordination   Initial Visit Note   05/30/2023 Name: Kegan Guseman MRN: [id number] DOB: 25-Jun-1956  Delvonte Somoza is a 67 y.o. year old male who sees Lonie Peak, [place] for primary care. I spoke with  Christene Lye by phone today.  What matters to the patients health and wellness today?  Patient discharged from [facility] health after fall and hyponatremia. Patient was discharge because [employer] denied SNF rehab.  Patient reports that he is doing well since getting home yesterday.   Reports that he has all his medications.  5 recent falls due to weakness.    Goals Addressed               This Visit's Progress     Recent falls and admission due to hyponatermia. (pt-stated)        Interventions Today    Flowsheet Row Most Recent Value  Chronic Disease   Chronic disease during today's visit Other  [hyponatermia, falls]  General Interventions   General Interventions Discussed/Reviewed General Interventions Discussed, Labs, Communication with, Level of Care  Communication with --  [hospital liasion Turkey Brewer.]  Exercise Interventions   Exercise Discussed/Reviewed Physical Activity  Education Interventions   Education Provided Provided Education  [Reviewed in great detail about treatment for hyponatermia.]  Provided Verbal Education On Nutrition, Medication, When to see the doctor  Nutrition Interventions   Nutrition Discussed/Reviewed Nutrition Discussed, Nutrition Reviewed, Increasing proteins, Decreasing salt, Fluid intake  [Reviewed with patient how to use a measuring cup to determine number of ounces.  Reviewed with patient that his fluid restricition is 41 ounces per day.]  Pharmacy Interventions   Pharmacy Dicussed/Reviewed Medications and their functions  Safety Interventions   Safety Discussed/Reviewed Fall Risk, Home Safety  Home Safety Contact provider for referral to PT/OT  [spoke with Charlesetta Shanks regarding home health orders not visualized in EPIC.]               SDOH assessments and interventions completed:  Yes  SDOH Interventions Today    Flowsheet Row Most Recent Value  SDOH Interventions   Food Insecurity Interventions Intervention Not Indicated  Housing Interventions Intervention Not Indicated  Utilities Interventions Intervention Not Indicated  Alcohol Usage Interventions Intervention Not Indicated (Score <7)        Care Coordination Interventions:  Yes, provided   Follow up plan: Follow up call scheduled for 06/05/2023    Encounter Outcome:  Pt. Visit Completed   Rowe Pavy, RN, BSN, CEN [facility] Coordinator [phone]

## 2023-05-30 NOTE — Progress Notes (Signed)
  Care Coordination   Note   05/30/2023 Name: Donnovan Delashmit MRN: [id number] DOB: 1956-03-10  Jarrid Lorance is a 67 y.o. year old male who sees Lonie Peak, [place] for primary care. I reached out to Christene Lye by phone today to offer care coordination services.  Mr. Mingus was given information about Care Coordination services today including:   The Care Coordination services include support from the care team which includes your Nurse Coordinator, Clinical Social Worker, or Pharmacist.  The Care Coordination team is here to help remove barriers to the health concerns and goals most important to you. Care Coordination services are voluntary, and the patient may decline or stop services at any time by request to their care team member.   Care Coordination Consent Status: Patient agreed to services and verbal consent obtained.   Follow up plan:  Telephone appointment with care coordination team member scheduled for:  05/30/23  Encounter Outcome:  patient scheduled   [facility] Coordination Care Guide  Direct Dial: [phone]

## 2023-06-02 DIAGNOSIS — E871 Hypo-osmolality and hyponatremia: Secondary | ICD-10-CM | POA: Diagnosis not present

## 2023-06-02 DIAGNOSIS — K7460 Unspecified cirrhosis of liver: Secondary | ICD-10-CM | POA: Diagnosis not present

## 2023-06-02 DIAGNOSIS — G2581 Restless legs syndrome: Secondary | ICD-10-CM | POA: Diagnosis not present

## 2023-06-02 DIAGNOSIS — G5790 Unspecified mononeuropathy of unspecified lower limb: Secondary | ICD-10-CM | POA: Diagnosis not present

## 2023-06-02 DIAGNOSIS — Z79899 Other long term (current) drug therapy: Secondary | ICD-10-CM | POA: Diagnosis not present

## 2023-06-02 DIAGNOSIS — R188 Other ascites: Secondary | ICD-10-CM | POA: Diagnosis not present

## 2023-06-02 DIAGNOSIS — I959 Hypotension, unspecified: Secondary | ICD-10-CM | POA: Diagnosis not present

## 2023-06-02 DIAGNOSIS — I4891 Unspecified atrial fibrillation: Secondary | ICD-10-CM | POA: Diagnosis not present

## 2023-06-05 ENCOUNTER — Ambulatory Visit: Payer: Self-pay

## 2023-06-05 NOTE — Patient Outreach (Signed)
  Care Coordination   06/05/2023 Name: Jeffrey Randolph MRN: [id number] DOB: 1956/09/23   Care Coordination Outreach Attempts:  An unsuccessful telephone outreach was attempted for a scheduled appointment today.  Follow Up Plan:  Additional outreach attempts will be made to offer the patient care coordination information and services.   Encounter Outcome:  No Answer   Care Coordination Interventions:  No, not indicated    Rowe Pavy, RN, BSN, [facility] [employer] [phone]

## 2023-06-10 DIAGNOSIS — E871 Hypo-osmolality and hyponatremia: Secondary | ICD-10-CM | POA: Diagnosis not present

## 2023-06-25 ENCOUNTER — Ambulatory Visit: Payer: Self-pay

## 2023-06-25 NOTE — Patient Outreach (Signed)
Care Coordination   Follow Up Visit Note   06/25/2023 Name: Jeffrey Randolph MRN: [id number] DOB: 05-Jun-1956  Jeffrey Randolph is a 67 y.o. year old male who sees Lonie Peak, [place] for primary care. I spoke with  Christene Lye by phone today.  What matters to the patients health and wellness today?  Spoke with patient today and he reports that he is doing well. Reports that he went to see PCP and no Home health was thought to be needed.  Patient reports that he is doing well and is working some. Denies any new falls. Denies any needs today.   Patient reports that he has had his labs redrawn and has another appointment in 1 month.    Goals Addressed               This Visit's Progress     COMPLETED: Recent falls and admission due to hyponatermia. (pt-stated)        Interventions Today    Flowsheet Row Most Recent Value  Chronic Disease   Chronic disease during today's visit Other  [hyponatermia and falls]  General Interventions   General Interventions Discussed/Reviewed General Interventions Discussed, Doctor Visits, Labs  Doctor Visits Discussed/Reviewed Doctor Visits Discussed, PCP  PCP/Specialist Visits Compliance with follow-up visit  Exercise Interventions   Exercise Discussed/Reviewed Physical Activity  [reviewed and assess for falls. No falls since discharge from SNF.  Patient denies need for PT with home health and reports that he is able to work.]  Physical Activity Discussed/Reviewed Physical Activity Discussed  Nutrition Interventions   Nutrition Discussed/Reviewed Nutrition Discussed  Pharmacy Interventions   Pharmacy Dicussed/Reviewed Medications and their functions  [encouraged patient to continue to take all his medications as prescribed.]  Safety Interventions   Safety Discussed/Reviewed Fall Risk              SDOH assessments and interventions completed:  No     Care Coordination Interventions:  Yes, provided   Follow up plan: No further intervention  required.   Encounter Outcome:  Patient Visit Completed   Lonia Chimera, RN, BSN, CEN [facility] Coordinator [phone]

## 2023-07-24 DIAGNOSIS — C44529 Squamous cell carcinoma of skin of other part of trunk: Secondary | ICD-10-CM | POA: Diagnosis not present

## 2023-07-24 DIAGNOSIS — C44519 Basal cell carcinoma of skin of other part of trunk: Secondary | ICD-10-CM | POA: Diagnosis not present

## 2023-07-24 DIAGNOSIS — D0462 Carcinoma in situ of skin of left upper limb, including shoulder: Secondary | ICD-10-CM | POA: Diagnosis not present

## 2023-07-30 DIAGNOSIS — I4891 Unspecified atrial fibrillation: Secondary | ICD-10-CM | POA: Diagnosis not present

## 2023-07-30 DIAGNOSIS — K7460 Unspecified cirrhosis of liver: Secondary | ICD-10-CM | POA: Diagnosis not present

## 2023-07-30 DIAGNOSIS — R188 Other ascites: Secondary | ICD-10-CM | POA: Diagnosis not present

## 2023-07-30 DIAGNOSIS — M545 Low back pain, unspecified: Secondary | ICD-10-CM | POA: Diagnosis not present

## 2023-07-30 DIAGNOSIS — E871 Hypo-osmolality and hyponatremia: Secondary | ICD-10-CM | POA: Diagnosis not present

## 2023-07-30 DIAGNOSIS — I69359 Hemiplegia and hemiparesis following cerebral infarction affecting unspecified side: Secondary | ICD-10-CM | POA: Diagnosis not present

## 2023-07-30 DIAGNOSIS — I10 Essential (primary) hypertension: Secondary | ICD-10-CM | POA: Diagnosis not present

## 2023-07-30 DIAGNOSIS — G5790 Unspecified mononeuropathy of unspecified lower limb: Secondary | ICD-10-CM | POA: Diagnosis not present

## 2023-08-26 DIAGNOSIS — Z9181 History of falling: Secondary | ICD-10-CM | POA: Diagnosis not present

## 2023-08-26 DIAGNOSIS — Z139 Encounter for screening, unspecified: Secondary | ICD-10-CM | POA: Diagnosis not present

## 2023-08-26 DIAGNOSIS — Z0000 Encounter for general adult medical examination without abnormal findings: Secondary | ICD-10-CM | POA: Diagnosis not present

## 2024-01-14 DIAGNOSIS — H2513 Age-related nuclear cataract, bilateral: Secondary | ICD-10-CM | POA: Diagnosis not present

## 2024-01-21 DIAGNOSIS — H2513 Age-related nuclear cataract, bilateral: Secondary | ICD-10-CM | POA: Diagnosis not present

## 2024-01-26 DIAGNOSIS — H2511 Age-related nuclear cataract, right eye: Secondary | ICD-10-CM | POA: Diagnosis not present

## 2024-01-26 DIAGNOSIS — I10 Essential (primary) hypertension: Secondary | ICD-10-CM | POA: Diagnosis not present

## 2024-01-30 DIAGNOSIS — I10 Essential (primary) hypertension: Secondary | ICD-10-CM | POA: Diagnosis not present

## 2024-01-30 DIAGNOSIS — R188 Other ascites: Secondary | ICD-10-CM | POA: Diagnosis not present

## 2024-01-30 DIAGNOSIS — R296 Repeated falls: Secondary | ICD-10-CM | POA: Diagnosis not present

## 2024-01-30 DIAGNOSIS — I69359 Hemiplegia and hemiparesis following cerebral infarction affecting unspecified side: Secondary | ICD-10-CM | POA: Diagnosis not present

## 2024-01-30 DIAGNOSIS — I4891 Unspecified atrial fibrillation: Secondary | ICD-10-CM | POA: Diagnosis not present

## 2024-01-30 DIAGNOSIS — E871 Hypo-osmolality and hyponatremia: Secondary | ICD-10-CM | POA: Diagnosis not present

## 2024-01-30 DIAGNOSIS — G2581 Restless legs syndrome: Secondary | ICD-10-CM | POA: Diagnosis not present

## 2024-01-30 DIAGNOSIS — G5790 Unspecified mononeuropathy of unspecified lower limb: Secondary | ICD-10-CM | POA: Diagnosis not present

## 2024-01-30 DIAGNOSIS — K7460 Unspecified cirrhosis of liver: Secondary | ICD-10-CM | POA: Diagnosis not present

## 2024-02-11 DIAGNOSIS — H2512 Age-related nuclear cataract, left eye: Secondary | ICD-10-CM | POA: Diagnosis not present

## 2024-02-11 DIAGNOSIS — I10 Essential (primary) hypertension: Secondary | ICD-10-CM | POA: Diagnosis not present

## 2024-03-29 DIAGNOSIS — K7460 Unspecified cirrhosis of liver: Secondary | ICD-10-CM | POA: Diagnosis not present

## 2024-03-29 DIAGNOSIS — I4891 Unspecified atrial fibrillation: Secondary | ICD-10-CM | POA: Diagnosis not present

## 2024-03-29 DIAGNOSIS — I10 Essential (primary) hypertension: Secondary | ICD-10-CM | POA: Diagnosis not present

## 2024-03-29 DIAGNOSIS — M5416 Radiculopathy, lumbar region: Secondary | ICD-10-CM | POA: Diagnosis not present

## 2024-03-29 DIAGNOSIS — R188 Other ascites: Secondary | ICD-10-CM | POA: Diagnosis not present

## 2024-04-14 ENCOUNTER — Emergency Department (HOSPITAL_COMMUNITY)

## 2024-04-14 ENCOUNTER — Observation Stay (HOSPITAL_COMMUNITY)
Admission: EM | Admit: 2024-04-14 | Discharge: 2024-04-16 | Disposition: A | Discharge status: Home / Self Care | Attending: Internal Medicine | Admitting: Internal Medicine

## 2024-04-14 ENCOUNTER — Encounter (HOSPITAL_COMMUNITY): Payer: Self-pay

## 2024-04-14 DIAGNOSIS — K8020 Calculus of gallbladder without cholecystitis without obstruction: Secondary | ICD-10-CM | POA: Insufficient documentation

## 2024-04-14 DIAGNOSIS — Z8679 Personal history of other diseases of the circulatory system: Secondary | ICD-10-CM | POA: Insufficient documentation

## 2024-04-14 DIAGNOSIS — E7220 Disorder of urea cycle metabolism, unspecified: Secondary | ICD-10-CM | POA: Insufficient documentation

## 2024-04-14 DIAGNOSIS — D696 Thrombocytopenia, unspecified: Secondary | ICD-10-CM | POA: Diagnosis not present

## 2024-04-14 DIAGNOSIS — I110 Hypertensive heart disease with heart failure: Secondary | ICD-10-CM | POA: Insufficient documentation

## 2024-04-14 DIAGNOSIS — R059 Cough, unspecified: Secondary | ICD-10-CM | POA: Diagnosis not present

## 2024-04-14 DIAGNOSIS — M545 Low back pain, unspecified: Secondary | ICD-10-CM | POA: Insufficient documentation

## 2024-04-14 DIAGNOSIS — M79606 Pain in leg, unspecified: Secondary | ICD-10-CM | POA: Insufficient documentation

## 2024-04-14 DIAGNOSIS — F1010 Alcohol abuse, uncomplicated: Secondary | ICD-10-CM | POA: Insufficient documentation

## 2024-04-14 DIAGNOSIS — R7401 Elevation of levels of liver transaminase levels: Secondary | ICD-10-CM | POA: Insufficient documentation

## 2024-04-14 DIAGNOSIS — I10 Essential (primary) hypertension: Secondary | ICD-10-CM | POA: Diagnosis present

## 2024-04-14 DIAGNOSIS — I4891 Unspecified atrial fibrillation: Secondary | ICD-10-CM | POA: Insufficient documentation

## 2024-04-14 DIAGNOSIS — R531 Weakness: Secondary | ICD-10-CM

## 2024-04-14 DIAGNOSIS — Z7901 Long term (current) use of anticoagulants: Secondary | ICD-10-CM | POA: Diagnosis not present

## 2024-04-14 DIAGNOSIS — R296 Repeated falls: Secondary | ICD-10-CM | POA: Insufficient documentation

## 2024-04-14 DIAGNOSIS — M5412 Radiculopathy, cervical region: Secondary | ICD-10-CM | POA: Diagnosis not present

## 2024-04-14 DIAGNOSIS — K7460 Unspecified cirrhosis of liver: Secondary | ICD-10-CM | POA: Diagnosis present

## 2024-04-14 DIAGNOSIS — R6889 Other general symptoms and signs: Secondary | ICD-10-CM | POA: Diagnosis not present

## 2024-04-14 DIAGNOSIS — Z8673 Personal history of transient ischemic attack (TIA), and cerebral infarction without residual deficits: Secondary | ICD-10-CM | POA: Diagnosis not present

## 2024-04-14 DIAGNOSIS — D539 Nutritional anemia, unspecified: Secondary | ICD-10-CM | POA: Diagnosis not present

## 2024-04-14 DIAGNOSIS — I482 Chronic atrial fibrillation, unspecified: Secondary | ICD-10-CM | POA: Diagnosis present

## 2024-04-14 DIAGNOSIS — I5030 Unspecified diastolic (congestive) heart failure: Secondary | ICD-10-CM | POA: Diagnosis not present

## 2024-04-14 DIAGNOSIS — I6529 Occlusion and stenosis of unspecified carotid artery: Secondary | ICD-10-CM | POA: Diagnosis not present

## 2024-04-14 DIAGNOSIS — S199XXA Unspecified injury of neck, initial encounter: Secondary | ICD-10-CM | POA: Diagnosis not present

## 2024-04-14 DIAGNOSIS — E871 Hypo-osmolality and hyponatremia: Secondary | ICD-10-CM | POA: Diagnosis not present

## 2024-04-14 DIAGNOSIS — G2581 Restless legs syndrome: Secondary | ICD-10-CM | POA: Insufficient documentation

## 2024-04-14 DIAGNOSIS — R9089 Other abnormal findings on diagnostic imaging of central nervous system: Secondary | ICD-10-CM | POA: Diagnosis not present

## 2024-04-14 DIAGNOSIS — J9811 Atelectasis: Secondary | ICD-10-CM | POA: Diagnosis not present

## 2024-04-14 DIAGNOSIS — S0990XA Unspecified injury of head, initial encounter: Secondary | ICD-10-CM | POA: Diagnosis not present

## 2024-04-14 LAB — CBC WITH DIFFERENTIAL/PLATELET
Abs Immature Granulocytes: 0.13 K/uL — ABNORMAL HIGH (ref 0.00–0.07)
Basophils Absolute: 0.1 K/uL (ref 0.0–0.1)
Basophils Relative: 2 %
Eosinophils Absolute: 0.2 K/uL (ref 0.0–0.5)
Eosinophils Relative: 3 %
HCT: 32.7 % — ABNORMAL LOW (ref 39.0–52.0)
Hemoglobin: 11.3 g/dL — ABNORMAL LOW (ref 13.0–17.0)
Immature Granulocytes: 2 %
Lymphocytes Relative: 20 %
Lymphs Abs: 1.3 K/uL (ref 0.7–4.0)
MCH: 36.7 pg — ABNORMAL HIGH (ref 26.0–34.0)
MCHC: 34.6 g/dL (ref 30.0–36.0)
MCV: 106.2 fL — ABNORMAL HIGH (ref 80.0–100.0)
Monocytes Absolute: 0.9 K/uL (ref 0.1–1.0)
Monocytes Relative: 14 %
Neutro Abs: 3.9 K/uL (ref 1.7–7.7)
Neutrophils Relative %: 59 %
Platelets: 145 K/uL — ABNORMAL LOW (ref 150–400)
RBC: 3.08 MIL/uL — ABNORMAL LOW (ref 4.22–5.81)
RDW: 19.9 % — ABNORMAL HIGH (ref 11.5–15.5)
WBC: 6.6 K/uL (ref 4.0–10.5)
nRBC: 0.3 % — ABNORMAL HIGH (ref 0.0–0.2)

## 2024-04-14 LAB — COMPREHENSIVE METABOLIC PANEL WITH GFR
ALT: 24 U/L (ref 0–44)
AST: 48 U/L — ABNORMAL HIGH (ref 15–41)
Albumin: 3.9 g/dL (ref 3.5–5.0)
Alkaline Phosphatase: 121 U/L (ref 38–126)
Anion gap: 14 (ref 5–15)
BUN: <5 mg/dL — ABNORMAL LOW (ref 8–23)
CO2: 18 mmol/L — ABNORMAL LOW (ref 22–32)
Calcium: 8.7 mg/dL — ABNORMAL LOW (ref 8.9–10.3)
Chloride: 89 mmol/L — ABNORMAL LOW (ref 98–111)
Creatinine, Ser: 0.74 mg/dL (ref 0.61–1.24)
GFR, Estimated: >60 mL/min (ref >60)
Glucose, Bld: 107 mg/dL — ABNORMAL HIGH (ref 70–99)
Potassium: 3.4 mmol/L — ABNORMAL LOW (ref 3.5–5.1)
Sodium: 121 mmol/L — ABNORMAL LOW (ref 135–145)
Total Bilirubin: 2 mg/dL — ABNORMAL HIGH (ref 0.0–1.2)
Total Protein: 7.2 g/dL (ref 6.5–8.1)

## 2024-04-14 LAB — URINALYSIS, W/ REFLEX TO CULTURE (INFECTION SUSPECTED)
Bacteria, UA: NONE SEEN
Bilirubin Urine: NEGATIVE
Glucose, UA: NEGATIVE mg/dL
Hgb urine dipstick: NEGATIVE
Ketones, ur: NEGATIVE mg/dL
Leukocytes,Ua: NEGATIVE
Nitrite: NEGATIVE
Protein, ur: NEGATIVE mg/dL
Specific Gravity, Urine: 1.013 (ref 1.005–1.030)
pH: 5 (ref 5.0–8.0)

## 2024-04-14 LAB — MAGNESIUM: Magnesium: 1.7 mg/dL (ref 1.7–2.4)

## 2024-04-14 NOTE — ED Notes (Signed)
 Pt endorsing tingling pains in his L arm. States it has started happening since his stroke. Comes and goes. Currently hurting him.

## 2024-04-14 NOTE — ED Triage Notes (Signed)
 Pt reports having repeated falls lately, starting to feel generalized weakness, endorsing pain in his legs. Unable to walk long distances anymore. Alert and oriented. VSS

## 2024-04-14 NOTE — ED Provider Notes (Signed)
 [place] EMERGENCY DEPARTMENT AT [facility] Provider Note   CSN: [id number] Arrival date & time: 04/14/24  2201     Patient presents with: Leg Pain   Jeffrey Randolph is a 68 y.o. male.   The history is provided by the patient and medical records.  Leg Pain  68 year old male with history of chronic A-fib, spinal stenosis, history of hyponatremia, presenting to the ED with generalized weakness.  Patient reports this seems to be worsening over the past 3 weeks or so.  He reports multiple falls occurring on a daily basis, fell about 4 or 5 times today.  He has struck his head in the past week but denies this today.  States he is only able to walk a short distance before he feels like his legs are incredibly weak and will not hold him up any longer.  He states this is not normal for him.  He is usually quite active, likes to garden and tinker with things around the house.  States he has been taking Lasix  due to some ankle edema recently.  He also stopped taking his anticoagulant (eliquis ) due to bruising it was causing on his arms.  Unclear when exactly his last dose was.  Prior to Admission medications   Medication Sig Start Date End Date Taking? Authorizing Provider  apixaban  (ELIQUIS ) 5 MG TABS tablet Take 1 tablet (5 mg total) by mouth 2 (two) times daily for 30 days. 12/22/18 07/19/23  Theadore Ozell HERO, MD  diltiazem  (CARDIZEM  CD) 240 MG 24 hr capsule Take 1 capsule (240 mg total) by mouth daily. 12/01/20   Fenton, Clint R, PA  DULoxetine  (CYMBALTA ) 30 MG capsule Take 30 mg by mouth daily.    [provider]  lactose free nutrition (BOOST) LIQD Take 1 Container by mouth 2 (two) times daily.    [provider]  midodrine  (PROAMATINE ) 5 MG tablet Take 1 tablet (5 mg total) by mouth 3 (three) times daily with meals. 05/29/23   Ghimire, Donalda HERO, MD  omeprazole (PRILOSEC) 40 MG capsule Take 40 mg by mouth daily as needed (heartburn).    [provider]   pregabalin  (LYRICA ) 200 MG capsule Take 1 capsule (200 mg total) by mouth 3 (three) times daily. 05/28/23   Ghimire, Donalda HERO, MD  rOPINIRole  (REQUIP ) 0.5 MG tablet Take 0.5 mg by mouth daily as needed (restless legs).    [provider]  urea  (URE-NA) 15 g PACK oral packet Take 15 g by mouth 2 (two) times daily. 05/29/23   Ghimire, Donalda HERO, MD    Allergies: Patient has no known allergies.    Review of Systems  Neurological:  Positive for weakness (generalized).  All other systems reviewed and are negative.   Updated Vital Signs BP 96/71   Pulse 60   Temp 97.6 F (36.4 C) (Oral)   Resp 17   SpO2 99%   Physical Exam Vitals and nursing note reviewed.  Constitutional:      Appearance: He is well-developed.  HENT:     Head: Normocephalic and atraumatic.  Eyes:     Conjunctiva/sclera: Conjunctivae normal.     Pupils: Pupils are equal, round, and reactive to light.  Cardiovascular:     Rate and Rhythm: Normal rate and regular rhythm.     Heart sounds: Normal heart sounds.  Pulmonary:     Effort: Pulmonary effort is normal. No respiratory distress.     Breath sounds: Normal breath sounds. No rhonchi.  Abdominal:  General: Bowel sounds are normal.     Palpations: Abdomen is soft.     Tenderness: There is no guarding.     Hernia: No hernia is present.  Musculoskeletal:        General: Normal range of motion.     Cervical back: Normal range of motion.     Comments: Pelvis is stable and non-tender, no leg shortening or malrotation  Skin:    General: Skin is warm and dry.  Neurological:     Mental Status: He is alert and oriented to person, place, and time.     Comments: AAOx3, able to answer all questions and follow commands, generalized weakness noted in the legs but able to plantar and dorsiflex, struggles to hold off bed independently, seemingly normal strength of the BUE, no facial droop, speech clear     (all labs ordered are listed, but only abnormal  results are displayed) Labs Reviewed  CBC WITH DIFFERENTIAL/PLATELET - Abnormal; Notable for the following components:      Result Value   RBC 3.08 (*)    Hemoglobin 11.3 (*)    HCT 32.7 (*)    MCV 106.2 (*)    MCH 36.7 (*)    RDW 19.9 (*)    Platelets 145 (*)    nRBC 0.3 (*)    Abs Immature Granulocytes 0.13 (*)    All other components within normal limits  COMPREHENSIVE METABOLIC PANEL WITH GFR - Abnormal; Notable for the following components:   Sodium 121 (*)    Potassium 3.4 (*)    Chloride 89 (*)    CO2 18 (*)    Glucose, Bld 107 (*)    BUN <5 (*)    Calcium 8.7 (*)    AST 48 (*)    Total Bilirubin 2.0 (*)    All other components within normal limits  URINALYSIS, W/ REFLEX TO CULTURE (INFECTION SUSPECTED)  MAGNESIUM     EKG: None  Radiology: DG Chest 2 View Result Date: 04/14/2024 CLINICAL DATA:  Weakness, multiple falls, cough EXAM: CHEST - 2 VIEW COMPARISON:  05/24/2023 FINDINGS: Heart and mediastinal contours within limits. Linear bibasilar opacities, likely atelectasis. No effusions or pneumothorax. No acute bony abnormality. IMPRESSION: Bibasilar atelectasis. Electronically Signed   By: Franky Crease M.D.   On: 04/14/2024 23:05   CT Head Wo Contrast Result Date: 04/14/2024 CLINICAL DATA:  Blunt facial and head trauma, neck pain. EXAM: CT HEAD WITHOUT CONTRAST CT CERVICAL SPINE WITHOUT CONTRAST TECHNIQUE: Multidetector CT imaging of the head and cervical spine was performed following the standard protocol without intravenous contrast. Multiplanar CT image reconstructions of the cervical spine were also generated. RADIATION DOSE REDUCTION: This exam was performed according to the departmental dose-optimization program which includes automated exposure control, adjustment of the mA and/or kV according to patient size and/or use of iterative reconstruction technique. COMPARISON:  Head CT and cervical spine CT both 05/24/2023. FINDINGS: CT HEAD FINDINGS Brain: There is again  noted moderate for age cerebral and mild cerebellar volume loss and moderately advanced for age small vessel disease. No cortical based acute infarct, hemorrhage, mass or mass effect is seen. There is mild atrophic ventriculomegaly. There is no midline shift. The basal cisterns are patent. No old territorial infarct is seen. Vascular: There are calcifications in the carotid siphons but no hyperdense central vessel is seen. Skull: Negative for fractures or focal lesions. No appreciable scalp hematoma. Sinuses/Orbits: Negative orbits apart from interval lens extractions. There is patchy opacification of the left ethmoid  air cells also new in the interval. Other paranasal sinuses, bilateral mastoid air cells, and middle ears are clear. Other: None. CT CERVICAL SPINE FINDINGS Alignment: Unchanged. Narrowing and spurring again noted of the anterior atlantodental joint and trace discogenic retrolisthesis at C2-3. There is a straightened lordosis without traumatic or further listhesis. Skull base and vertebrae: No acute fracture is evident. No primary bone lesion or focal pathologic process. Soft tissues and spinal canal: No prevertebral fluid or swelling. No visible canal hematoma. There calcifications of the right greater than left proximal cervical ICAs. No laryngeal or thyroid  mass. Disc levels: The discs are normal in height at C2-3 and C7-T1, variably degenerated at the intervening levels with the greatest disc space loss again at C3-4. There is bidirectional endplate spurring most levels. There is mild encroachment on the ventral thecal sac at C3-4 due to broad disc osteophyte complex, C4-5 central disc protrusion impressing into the ventral cord surface in the midline, seen previously, with mild nonstenosing disc osteophyte complexes at C5-6 and C6-7. There is uncinate and facet joint spurring, with acquired foraminal stenosis which is bilaterally severe C3-4, mild on the left at C4-5. The other foramina are clear.  Upper chest: Negative. Other: None. IMPRESSION: 1. No acute intracranial CT findings or depressed skull fractures. 2. Volume loss and small vessel disease. 3. Straightened lordosis without evidence of cervical fractures or traumatic listhesis. 4. Multilevel degenerative change, with C4-5 central disc protrusion impressing into the ventral cord surface in the midline, and bilaterally severe C3-4 foraminal stenosis. 5. Carotid atherosclerosis. Electronically Signed   By: Francis Quam M.D.   On: 04/14/2024 23:02   CT Cervical Spine Wo Contrast Result Date: 04/14/2024 CLINICAL DATA:  Blunt facial and head trauma, neck pain. EXAM: CT HEAD WITHOUT CONTRAST CT CERVICAL SPINE WITHOUT CONTRAST TECHNIQUE: Multidetector CT imaging of the head and cervical spine was performed following the standard protocol without intravenous contrast. Multiplanar CT image reconstructions of the cervical spine were also generated. RADIATION DOSE REDUCTION: This exam was performed according to the departmental dose-optimization program which includes automated exposure control, adjustment of the mA and/or kV according to patient size and/or use of iterative reconstruction technique. COMPARISON:  Head CT and cervical spine CT both 05/24/2023. FINDINGS: CT HEAD FINDINGS Brain: There is again noted moderate for age cerebral and mild cerebellar volume loss and moderately advanced for age small vessel disease. No cortical based acute infarct, hemorrhage, mass or mass effect is seen. There is mild atrophic ventriculomegaly. There is no midline shift. The basal cisterns are patent. No old territorial infarct is seen. Vascular: There are calcifications in the carotid siphons but no hyperdense central vessel is seen. Skull: Negative for fractures or focal lesions. No appreciable scalp hematoma. Sinuses/Orbits: Negative orbits apart from interval lens extractions. There is patchy opacification of the left ethmoid air cells also new in the interval.  Other paranasal sinuses, bilateral mastoid air cells, and middle ears are clear. Other: None. CT CERVICAL SPINE FINDINGS Alignment: Unchanged. Narrowing and spurring again noted of the anterior atlantodental joint and trace discogenic retrolisthesis at C2-3. There is a straightened lordosis without traumatic or further listhesis. Skull base and vertebrae: No acute fracture is evident. No primary bone lesion or focal pathologic process. Soft tissues and spinal canal: No prevertebral fluid or swelling. No visible canal hematoma. There calcifications of the right greater than left proximal cervical ICAs. No laryngeal or thyroid  mass. Disc levels: The discs are normal in height at C2-3 and C7-T1, variably degenerated at  the intervening levels with the greatest disc space loss again at C3-4. There is bidirectional endplate spurring most levels. There is mild encroachment on the ventral thecal sac at C3-4 due to broad disc osteophyte complex, C4-5 central disc protrusion impressing into the ventral cord surface in the midline, seen previously, with mild nonstenosing disc osteophyte complexes at C5-6 and C6-7. There is uncinate and facet joint spurring, with acquired foraminal stenosis which is bilaterally severe C3-4, mild on the left at C4-5. The other foramina are clear. Upper chest: Negative. Other: None. IMPRESSION: 1. No acute intracranial CT findings or depressed skull fractures. 2. Volume loss and small vessel disease. 3. Straightened lordosis without evidence of cervical fractures or traumatic listhesis. 4. Multilevel degenerative change, with C4-5 central disc protrusion impressing into the ventral cord surface in the midline, and bilaterally severe C3-4 foraminal stenosis. 5. Carotid atherosclerosis. Electronically Signed   By: Francis Quam M.D.   On: 04/14/2024 23:02     Procedures   CRITICAL CARE Performed by: Olam CHRISTELLA [place]   Total critical care time: 45 minutes  Critical care time was exclusive  of separately billable procedures and treating other patients.  Critical care was necessary to treat or prevent imminent or life-threatening deterioration.  Critical care was time spent personally by me on the following activities: development of treatment plan with patient and/or surrogate as well as nursing, discussions with consultants, evaluation of patient's response to treatment, examination of patient, obtaining history from patient or surrogate, ordering and performing treatments and interventions, ordering and review of laboratory studies, ordering and review of radiographic studies, pulse oximetry and re-evaluation of patient's condition.   Medications Ordered in the ED - No data to display                                  Medical Decision Making Amount and/or Complexity of Data Reviewed Labs: ordered. Radiology: ordered and independent interpretation performed. ECG/medicine tests: ordered and independent interpretation performed.  Risk Decision regarding hospitalization.   68 year old male presenting to the ED with generalized weakness and frequent falls.  Has been worsening over the past few weeks.  He took himself off of his Eliquis , unknown last dose.  He is awake, alert, oriented here.  He does not have any significant signs of trauma on exam.  He is neurologically intact but legs are generally weak.  EKG NSR without acute ischemic changes.  Has recently started taking lasix  again due to ankle edema.  Labs obtained--no leukocytosis, hemoglobin 11.3 which appears baseline.  Sodium is low at 121 today.  He has a history of same, I suspect this may be exacerbated by restarting his diuretics.  CT head and neck are negative for any acute findings.  Chest x-ray is clear.  He will require admission for correction of his hyponatremia.  Discussed with hospitalist, Dr. Franky-- will admit for ongoing care.  Final diagnoses:  Hyponatremia  Frequent falls  Generalized weakness     ED Discharge Orders     None          [place] Olam CHRISTELLA, PA-C 04/15/24 0000    Melvenia Motto, MD 04/15/24 [phone]

## 2024-04-15 ENCOUNTER — Encounter (HOSPITAL_COMMUNITY): Payer: Self-pay | Admitting: Internal Medicine

## 2024-04-15 ENCOUNTER — Observation Stay (HOSPITAL_COMMUNITY)

## 2024-04-15 ENCOUNTER — Other Ambulatory Visit: Payer: Self-pay

## 2024-04-15 ENCOUNTER — Observation Stay (HOSPITAL_BASED_OUTPATIENT_CLINIC_OR_DEPARTMENT_OTHER)

## 2024-04-15 DIAGNOSIS — M4712 Other spondylosis with myelopathy, cervical region: Secondary | ICD-10-CM | POA: Diagnosis not present

## 2024-04-15 DIAGNOSIS — M79606 Pain in leg, unspecified: Secondary | ICD-10-CM | POA: Insufficient documentation

## 2024-04-15 DIAGNOSIS — G2581 Restless legs syndrome: Secondary | ICD-10-CM | POA: Insufficient documentation

## 2024-04-15 DIAGNOSIS — K828 Other specified diseases of gallbladder: Secondary | ICD-10-CM | POA: Diagnosis not present

## 2024-04-15 DIAGNOSIS — F1010 Alcohol abuse, uncomplicated: Secondary | ICD-10-CM

## 2024-04-15 DIAGNOSIS — M545 Low back pain, unspecified: Secondary | ICD-10-CM | POA: Insufficient documentation

## 2024-04-15 DIAGNOSIS — M5440 Lumbago with sciatica, unspecified side: Secondary | ICD-10-CM

## 2024-04-15 DIAGNOSIS — E871 Hypo-osmolality and hyponatremia: Principal | ICD-10-CM

## 2024-04-15 DIAGNOSIS — M4802 Spinal stenosis, cervical region: Secondary | ICD-10-CM | POA: Diagnosis not present

## 2024-04-15 DIAGNOSIS — K7460 Unspecified cirrhosis of liver: Secondary | ICD-10-CM | POA: Diagnosis not present

## 2024-04-15 DIAGNOSIS — D696 Thrombocytopenia, unspecified: Secondary | ICD-10-CM | POA: Insufficient documentation

## 2024-04-15 DIAGNOSIS — R531 Weakness: Secondary | ICD-10-CM | POA: Diagnosis not present

## 2024-04-15 DIAGNOSIS — K8020 Calculus of gallbladder without cholecystitis without obstruction: Secondary | ICD-10-CM | POA: Diagnosis not present

## 2024-04-15 DIAGNOSIS — M50021 Cervical disc disorder at C4-C5 level with myelopathy: Secondary | ICD-10-CM | POA: Diagnosis not present

## 2024-04-15 DIAGNOSIS — R161 Splenomegaly, not elsewhere classified: Secondary | ICD-10-CM | POA: Diagnosis not present

## 2024-04-15 DIAGNOSIS — M79609 Pain in unspecified limb: Secondary | ICD-10-CM

## 2024-04-15 DIAGNOSIS — R932 Abnormal findings on diagnostic imaging of liver and biliary tract: Secondary | ICD-10-CM | POA: Diagnosis not present

## 2024-04-15 DIAGNOSIS — R100 Acute abdomen: Secondary | ICD-10-CM | POA: Diagnosis not present

## 2024-04-15 DIAGNOSIS — K7689 Other specified diseases of liver: Secondary | ICD-10-CM | POA: Diagnosis not present

## 2024-04-15 LAB — BASIC METABOLIC PANEL WITH GFR
Anion gap: 10 (ref 5–15)
Anion gap: 10 (ref 5–15)
Anion gap: 7 (ref 5–15)
Anion gap: 9 (ref 5–15)
Anion gap: 9 (ref 5–15)
BUN: <5 mg/dL — ABNORMAL LOW (ref 8–23)
BUN: 5 mg/dL — ABNORMAL LOW (ref 8–23)
BUN: 13 mg/dL (ref 8–23)
BUN: 13 mg/dL (ref 8–23)
BUN: 9 mg/dL (ref 8–23)
CO2: 21 mmol/L — ABNORMAL LOW (ref 22–32)
CO2: 24 mmol/L (ref 22–32)
CO2: 24 mmol/L (ref 22–32)
CO2: 23 mmol/L (ref 22–32)
CO2: 25 mmol/L (ref 22–32)
Calcium: 8.7 mg/dL — ABNORMAL LOW (ref 8.9–10.3)
Calcium: 8.6 mg/dL — ABNORMAL LOW (ref 8.9–10.3)
Calcium: 8.9 mg/dL (ref 8.9–10.3)
Calcium: 9 mg/dL (ref 8.9–10.3)
Calcium: 9.2 mg/dL (ref 8.9–10.3)
Chloride: 91 mmol/L — ABNORMAL LOW (ref 98–111)
Chloride: 92 mmol/L — ABNORMAL LOW (ref 98–111)
Chloride: 98 mmol/L (ref 98–111)
Chloride: 98 mmol/L (ref 98–111)
Chloride: 98 mmol/L (ref 98–111)
Creatinine, Ser: 0.5 mg/dL — ABNORMAL LOW (ref 0.61–1.24)
Creatinine, Ser: 0.5 mg/dL — ABNORMAL LOW (ref 0.61–1.24)
Creatinine, Ser: 0.54 mg/dL — ABNORMAL LOW (ref 0.61–1.24)
Creatinine, Ser: 0.47 mg/dL — ABNORMAL LOW (ref 0.61–1.24)
Creatinine, Ser: 0.49 mg/dL — ABNORMAL LOW (ref 0.61–1.24)
GFR, Estimated: >60 mL/min (ref >60)
GFR, Estimated: >60 mL/min (ref >60)
GFR, Estimated: >60 mL/min (ref >60)
GFR, Estimated: >60 mL/min (ref >60)
GFR, Estimated: >60 mL/min (ref >60)
Glucose, Bld: 94 mg/dL (ref 70–99)
Glucose, Bld: 112 mg/dL — ABNORMAL HIGH (ref 70–99)
Glucose, Bld: 120 mg/dL — ABNORMAL HIGH (ref 70–99)
Glucose, Bld: 115 mg/dL — ABNORMAL HIGH (ref 70–99)
Glucose, Bld: 89 mg/dL (ref 70–99)
Potassium: 3.9 mmol/L (ref 3.5–5.1)
Potassium: 3.6 mmol/L (ref 3.5–5.1)
Potassium: 4.2 mmol/L (ref 3.5–5.1)
Potassium: 4 mmol/L (ref 3.5–5.1)
Potassium: 3.4 mmol/L — ABNORMAL LOW (ref 3.5–5.1)
Sodium: 122 mmol/L — ABNORMAL LOW (ref 135–145)
Sodium: 126 mmol/L — ABNORMAL LOW (ref 135–145)
Sodium: 129 mmol/L — ABNORMAL LOW (ref 135–145)
Sodium: 130 mmol/L — ABNORMAL LOW (ref 135–145)
Sodium: 132 mmol/L — ABNORMAL LOW (ref 135–145)

## 2024-04-15 LAB — OSMOLALITY: Osmolality: 257 mosm/kg — ABNORMAL LOW (ref 275–295)

## 2024-04-15 LAB — VAS US ABI WITH/WO TBI
Left ABI: 1.44
Right ABI: 1.33

## 2024-04-15 LAB — HIV ANTIBODY (ROUTINE TESTING W REFLEX): HIV Screen 4th Generation wRfx: NONREACTIVE

## 2024-04-15 LAB — TSH: TSH: 2.135 u[IU]/mL (ref 0.350–4.500)

## 2024-04-15 LAB — PROTIME-INR
INR: 1.5 — ABNORMAL HIGH (ref 0.8–1.2)
Prothrombin Time: 18.6 s — ABNORMAL HIGH (ref 11.4–15.2)

## 2024-04-15 LAB — SODIUM, URINE, RANDOM: Sodium, Ur: 26 mmol/L

## 2024-04-15 LAB — CORTISOL: Cortisol, Plasma: 15.2 ug/dL

## 2024-04-15 LAB — OSMOLALITY, URINE: Osmolality, Ur: 108 mosm/kg — ABNORMAL LOW (ref 300–900)

## 2024-04-15 MED ORDER — THIAMINE HCL 100 MG/ML IJ SOLN
100.0000 mg | Freq: Every day | INTRAMUSCULAR | Status: DC
  Filled 2024-04-15: qty 2

## 2024-04-15 MED ORDER — ADULT MULTIVITAMIN W/MINERALS CH
1.0000 | ORAL_TABLET | Freq: Every day | ORAL | Status: DC
  Administered 2024-04-15 – 2024-04-16 (×2): 1 via ORAL
  Filled 2024-04-15 (×2): qty 1

## 2024-04-15 MED ORDER — LORAZEPAM 1 MG PO TABS: 0.0000 mg | ORAL_TABLET | Freq: Four times a day (QID) | ORAL | Status: DC

## 2024-04-15 MED ORDER — DILTIAZEM HCL ER COATED BEADS 120 MG PO CP24
240.0000 mg | ORAL_CAPSULE | Freq: Every day | ORAL | Status: DC
  Administered 2024-04-15 – 2024-04-16 (×2): 240 mg via ORAL
  Filled 2024-04-15: qty 1
  Filled 2024-04-15: qty 2

## 2024-04-15 MED ORDER — PREGABALIN 100 MG PO CAPS
200.0000 mg | ORAL_CAPSULE | Freq: Three times a day (TID) | ORAL | Status: DC
  Administered 2024-04-15 – 2024-04-16 (×2): 200 mg via ORAL
  Filled 2024-04-15 (×2): qty 2

## 2024-04-15 MED ORDER — LORAZEPAM 1 MG PO TABS: 0.0000 mg | ORAL_TABLET | Freq: Two times a day (BID) | ORAL | Status: DC

## 2024-04-15 MED ORDER — UREA 15 G PO PACK
15.0000 g | PACK | Freq: Two times a day (BID) | ORAL | Status: DC
  Administered 2024-04-15: 15 g via ORAL
  Filled 2024-04-15 (×2): qty 1

## 2024-04-15 MED ORDER — THIAMINE MONONITRATE 100 MG PO TABS
100.0000 mg | ORAL_TABLET | Freq: Every day | ORAL | Status: DC
  Administered 2024-04-15 – 2024-04-16 (×2): 100 mg via ORAL
  Filled 2024-04-15 (×2): qty 1

## 2024-04-15 MED ORDER — LORAZEPAM 1 MG PO TABS: 1.0000 mg | ORAL_TABLET | ORAL | Status: DC | PRN

## 2024-04-15 MED ORDER — ACETAMINOPHEN 500 MG PO TABS: 500.0000 mg | ORAL_TABLET | Freq: Four times a day (QID) | ORAL | Status: DC | PRN

## 2024-04-15 MED ORDER — OXYCODONE HCL 5 MG PO TABS
5.0000 mg | ORAL_TABLET | ORAL | Status: DC | PRN
  Administered 2024-04-15: 5 mg via ORAL
  Filled 2024-04-15: qty 1

## 2024-04-15 MED ORDER — APIXABAN 5 MG PO TABS
5.0000 mg | ORAL_TABLET | Freq: Two times a day (BID) | ORAL | Status: DC
  Administered 2024-04-15 – 2024-04-16 (×4): 5 mg via ORAL
  Filled 2024-04-15 (×4): qty 1

## 2024-04-15 MED ORDER — FOLIC ACID 1 MG PO TABS
1.0000 mg | ORAL_TABLET | Freq: Every day | ORAL | Status: DC
  Administered 2024-04-15 – 2024-04-16 (×2): 1 mg via ORAL
  Filled 2024-04-15 (×2): qty 1

## 2024-04-15 MED ORDER — PREGABALIN 100 MG PO CAPS
100.0000 mg | ORAL_CAPSULE | Freq: Two times a day (BID) | ORAL | Status: DC
  Administered 2024-04-15: 100 mg via ORAL
  Filled 2024-04-15: qty 1

## 2024-04-15 MED ORDER — FUROSEMIDE 20 MG PO TABS
40.0000 mg | ORAL_TABLET | Freq: Once | ORAL | Status: AC
  Administered 2024-04-15: 40 mg via ORAL
  Filled 2024-04-15: qty 2

## 2024-04-15 MED ORDER — ROPINIROLE HCL 1 MG PO TABS
0.5000 mg | ORAL_TABLET | Freq: Every day | ORAL | Status: DC | PRN
  Administered 2024-04-15: 0.5 mg via ORAL
  Filled 2024-04-15: qty 1

## 2024-04-15 MED ORDER — ENSURE PLUS HIGH PROTEIN PO LIQD
237.0000 mL | Freq: Three times a day (TID) | ORAL | Status: DC
  Administered 2024-04-15 – 2024-04-16 (×3): 237 mL via ORAL
  Filled 2024-04-15: qty 237

## 2024-04-15 MED ORDER — IOHEXOL 350 MG/ML SOLN
75.0000 mL | Freq: Once | INTRAVENOUS | Status: AC | PRN
  Administered 2024-04-15: 75 mL via INTRAVENOUS

## 2024-04-15 MED ORDER — LORAZEPAM 2 MG/ML IJ SOLN: 1.0000 mg | INTRAMUSCULAR | Status: DC | PRN

## 2024-04-15 MED ORDER — PANTOPRAZOLE SODIUM 40 MG PO TBEC
40.0000 mg | DELAYED_RELEASE_TABLET | Freq: Every day | ORAL | Status: DC
  Administered 2024-04-15 – 2024-04-16 (×2): 40 mg via ORAL
  Filled 2024-04-15 (×2): qty 1

## 2024-04-15 MED ORDER — POTASSIUM CHLORIDE 20 MEQ PO PACK
40.0000 meq | PACK | ORAL | Status: AC
  Administered 2024-04-15 (×2): 40 meq via ORAL
  Filled 2024-04-15 (×2): qty 2

## 2024-04-15 MED ORDER — OXYCODONE HCL 5 MG PO TABS: 2.5000 mg | ORAL_TABLET | ORAL | Status: DC | PRN

## 2024-04-15 NOTE — Evaluation (Signed)
 Physical Therapy Evaluation Patient Details Name: Jeffrey Randolph MRN: [id number] DOB: Oct 24, 1955 Today's Date: 04/15/2024  History of Present Illness  68 y.o. male presents to [facility] 04/14/24 with frequent falls, increased pain in LE and weakness. CT abdomen/pelvis showed fluid overload w/ pleural effusion, thickening of gallbladder, and flow limitation in L femoral artery. C-spine CT showed C4-C5 central disc protrusion on ventral cord. PMH includes afib, HTN, CVA, hepatic cirrhosis, neuropathy, HFpEF.   Clinical Impression  Pt supine on stretcher upon arrival and agreeable to PT eval. PTA, pt was independent for mobility with no AD. Reports history of multiple falls with at least 5 falls in the past week. Reports that his legs give out and he does not have enough time to sit down prior to collapsing. In today's session, pt required MinA to stand and ambulate 72ft with RW. Pt was unsteady when turning requiring MinA to correct.. Pt has intermittent level of assist available at home. Recommending post-acute rehab <3hrs to prevent future falls and reduce hospital re-admission rate. Pt would benefit from acute skilled PT with current functional limitations listed below (see PT Problem List). Acute PT to follow.         If plan is discharge home, recommend the following: A little help with walking and/or transfers;A little help with bathing/dressing/bathroom;Assist for transportation;Help with stairs or ramp for entrance   Can travel by private vehicle   Yes    Equipment Recommendations None recommended by PT  Recommendations for Other Services  OT consult    Functional Status Assessment Patient has had a recent decline in their functional status and demonstrates the ability to make significant improvements in function in a reasonable and predictable amount of time.     Precautions / Restrictions Precautions Precautions: Fall Recall of Precautions/Restrictions: Intact Restrictions Weight Bearing  Restrictions Per Provider Order: No      Mobility  Bed Mobility Overal bed mobility: Needs Assistance Bed Mobility: Supine to Sit, Sit to Supine     Supine to sit: Contact guard, HOB elevated Sit to supine: Contact guard assist, HOB elevated   General bed mobility comments: CGA for safety    Transfers Overall transfer level: Needs assistance Equipment used: Rolling walker (2 wheels) Transfers: Sit to/from Stand Sit to Stand: Min assist      General transfer comment: MinA for slight steadying assist. Cues for hand placement    Ambulation/Gait Ambulation/Gait assistance: Min assist Gait Distance (Feet): 80 Feet (x80, x100, x80) Assistive device: Rolling walker (2 wheels) Gait Pattern/deviations: Step-through pattern, Decreased stride length, Trunk flexed Gait velocity: decr     General Gait Details: forward flexed posture with cues for RW proximity. Unsteadiness with turns requiring MinA for stability    Balance Overall balance assessment: Mild deficits observed, not formally tested, Needs assistance, History of Falls Sitting-balance support: No upper extremity supported, Feet supported Sitting balance-Leahy Scale: Good     Standing balance support: No upper extremity supported Standing balance-Leahy Scale: Fair Standing balance comment: able to stand with no UE support, RW for gait        Pertinent Vitals/Pain Pain Assessment Pain Assessment: Faces Faces Pain Scale: Hurts even more Pain Location: B LE's Pain Descriptors / Indicators: Aching, Burning, Discomfort Pain Intervention(s): Limited activity within patient's tolerance, Monitored during session, Repositioned    Home Living Family/patient expects to be discharged to:: Private residence Living Arrangements: Alone Available Help at Discharge: Friend(s);Available PRN/intermittently Type of Home: House Home Access: Level entry       Home  Layout: One level Home Equipment: Agricultural consultant (2  wheels);Wheelchair - manual (friends DME, however, it is not being used)      Prior Function Prior Level of Function : Independent/Modified Independent;History of Falls (last six months)      Mobility Comments: ModI with no AD, 5 falls in the past week. Legs give out on him ADLs Comments: drives scooter for community mobility or gets rides with friends; independent ADLs, IADLs     Extremity/Trunk Assessment   Upper Extremity Assessment Upper Extremity Assessment: Defer to OT evaluation    Lower Extremity Assessment Lower Extremity Assessment: Generalized weakness (pain and cramping with movement)    Cervical / Trunk Assessment Cervical / Trunk Assessment: Normal  Communication   Communication Communication: No apparent difficulties    Cognition Arousal: Alert Behavior During Therapy: WFL for tasks assessed/performed   PT - Cognitive impairments: No apparent impairments      Following commands: Intact       Cueing Cueing Techniques: Verbal cues     General Comments General comments (skin integrity, edema, etc.): Cleared for PT from Dr.Patel     PT Assessment Patient needs continued PT services  PT Problem List Decreased strength;Decreased activity tolerance;Decreased balance;Decreased mobility       PT Treatment Interventions DME instruction;Gait training;Functional mobility training;Therapeutic activities;Therapeutic exercise;Stair training;Balance training;Neuromuscular re-education;Patient/family education    PT Goals (Current goals can be found in the Care Plan section)  Acute Rehab PT Goals Patient Stated Goal: to have less falls PT Goal Formulation: With patient Time For Goal Achievement: 04/29/24 Potential to Achieve Goals: Good    Frequency Min 2X/week        AM-PAC PT 6 Clicks Mobility  Outcome Measure Help needed turning from your back to your side while in a flat bed without using bedrails?: A Little Help needed moving from lying on your  back to sitting on the side of a flat bed without using bedrails?: A Little Help needed moving to and from a bed to a chair (including a wheelchair)?: A Little Help needed standing up from a chair using your arms (e.g., wheelchair or bedside chair)?: A Little Help needed to walk in hospital room?: A Little Help needed climbing 3-5 steps with a railing? : A Little 6 Click Score: 18    End of Session Equipment Utilized During Treatment: Gait belt Activity Tolerance: Patient tolerated treatment well Patient left: in bed;with call bell/phone within reach Nurse Communication: Mobility status PT Visit Diagnosis: Unsteadiness on feet (R26.81);Muscle weakness (generalized) (M62.81);History of falling (Z91.81);Repeated falls (R29.6)    Time: 9168-9150 PT Time Calculation (min) (ACUTE ONLY): 18 min   Charges:   PT Evaluation $PT Eval Low Complexity: 1 Low   PT General Charges $$ ACUTE PT VISIT: 1 Visit       Kate ORN, PT, DPT Secure Chat Preferred  Rehab Office [phone]   Kate BRAVO Wendolyn 04/15/2024, 8:56 AM

## 2024-04-15 NOTE — Discharge Instructions (Signed)

## 2024-04-15 NOTE — Progress Notes (Signed)
 CSW added substance abuse resources to patient's AVS.  Edwin Dada, MSW, LCSW Transitions of Care  Clinical Social Worker II [phone]

## 2024-04-15 NOTE — Care Management Obs Status (Signed)
 MEDICARE OBSERVATION STATUS NOTIFICATION   Patient Details  Name: Jeffrey Randolph MRN: [id number] Date of Birth: 10/05/1956   Medicare Observation Status Notification Given:     yes   Shalaya Swailes, RN 04/15/2024, 9:27 PM

## 2024-04-15 NOTE — Progress Notes (Signed)
 [facility] PLAN OF CARE NOTE Patient: Jeffrey Randolph FMW:[id number]   PCP: Montey Lot, PA-C DOB: [birth year]   DOA: 04/14/2024   DOS: 04/15/2024    Patient was admitted by my colleague earlier on 04/15/2024. I have reviewed the H&P as well as assessment and plan and agree with the same. Important changes in the plan are listed below.  Plan of care: Principal Problem:   Hyponatremia Active Problems:   Hepatic cirrhosis (HCC)   Hypertension   History of stroke   Atrial fibrillation, chronic (HCC)   Low back pain   Alcohol abuse   Thrombocytopenia (HCC)   Lower extremity pain   RLS (restless legs syndrome)  Hyponatremia appears to be improving. Sodium level 134 in April 2025, 127 in June 2025 Currently improving.  With just fluid restriction.  Will continue the same.  Recurrent falls. MRI C-spine shows evidence of high-grade canal stenosis at C3-C4 and C4-C5. Appreciate neurosurgery evaluation. Patient will require surgery, plan will depend on medical stability.  Chronic neuropathic pain. Continue Lyrica  at home dose of 200 mg twice daily. Add as needed oxycodone .  Cholelithiasis with gallbladder wall thickening. Patient does not have any evidence of abdominal pain, nausea vomiting fever or chills. Less likely cholecystitis and more likely edema in the setting of cirrhosis. Monitor clinically for now.  Level of care: Progressive  Author: Yetta Blanch, MD  [facility] 04/15/2024 5:05 PM   If 7PM-7AM, please contact night-coverage at [email or web address]

## 2024-04-15 NOTE — Consult Note (Signed)
 Reason for Consult: Cervical spondylosis with myelopathy Referring Physician: Dr. Tobie Gear Abboud is an 68 y.o. male.  HPI: Patient is a 68 year old individual with a history of alcoholic liver cirrhosis atrial fibrillation history of stroke neuropathy who presents because of increased history of lower extremity weakness.  He states he has been having frequent falls.  He has been told he has peripheral neuropathy.  An MRI was performed today which demonstrates that the patient has high-grade canal compromise at C3-4 C4-5 and C5-6.  He is seen now for neurosurgical evaluation.  Past Medical History:  Diagnosis Date   Atrial fibrillation with rapid ventricular response (HCC)    BPH (benign prostatic hyperplasia)    CVA (cerebral infarction)    Dysrhythmia    GERD (gastroesophageal reflux disease)    History of stroke 05/26/2019   HNP (herniated nucleus pulposus), lumbar 01/09/2015   Hypertension    Hypokalemia    Hypomagnesemia 05/26/2019   Stroke (HCC)    Left foot always feel 2 x larger, walks with a limp.    Past Surgical History:  Procedure Laterality Date   ESOPHAGOGASTRODUODENOSCOPY (EGD) WITH PROPOFOL  N/A 01/06/2023   Procedure: ESOPHAGOGASTRODUODENOSCOPY (EGD) WITH PROPOFOL ;  Surgeon: [name], MD;  Location: [facility] ENDOSCOPY;  Service: Gastroenterology;  Laterality: N/A;  Wants close to 9 AM arrival   LUMBAR LAMINECTOMY/DECOMPRESSION MICRODISCECTOMY Right 01/09/2015   Procedure: LUMBAR LAMINECTOMY/DECOMPRESSION MICRODISCECTOMY 1 LEVEL;  Surgeon: [name], MD;  Location: [facility]URO [facility];  Service: Neurosurgery;  Laterality: Right;  LUMBAR LAMINECTOMY/DECOMPRESSION MICRODISCECTOMY 1 LEVEL RIGHT LUMBAR 2-3   TONSILLECTOMY      Family History  Problem Relation Age of Onset   Pancreatic cancer Mother     Social History:  reports that he has quit smoking. His smoking use included cigarettes. He has never used smokeless tobacco. He reports that he does not currently use alcohol. He  reports that he does not use drugs.  Allergies: No Known Allergies  Medications: I have reviewed the patient's current medications.  Results for orders placed or performed during the hospital encounter of 04/14/24 (from the past 48 hours)  CBC with Differential     Status: Abnormal   Collection Time: 04/14/24 10:24 PM  Result Value Ref Range   WBC 6.6 4.0 - 10.5 K/uL   RBC 3.08 (L) 4.22 - 5.81 MIL/uL   Hemoglobin 11.3 (L) 13.0 - 17.0 g/dL   HCT 67.2 (L) 60.9 - 47.9 %   MCV 106.2 (H) 80.0 - 100.0 fL   MCH 36.7 (H) 26.0 - 34.0 pg   MCHC 34.6 30.0 - 36.0 g/dL   RDW 80.0 (H) 88.4 - 84.4 %   Platelets 145 (L) 150 - 400 K/uL   nRBC 0.3 (H) 0.0 - 0.2 %   Neutrophils Relative % 59 %   Neutro Abs 3.9 1.7 - 7.7 K/uL   Lymphocytes Relative 20 %   Lymphs Abs 1.3 0.7 - 4.0 K/uL   Monocytes Relative 14 %   Monocytes Absolute 0.9 0.1 - 1.0 K/uL   Eosinophils Relative 3 %   Eosinophils Absolute 0.2 0.0 - 0.5 K/uL   Basophils Relative 2 %   Basophils Absolute 0.1 0.0 - 0.1 K/uL   Immature Granulocytes 2 %   Abs Immature Granulocytes 0.13 (H) 0.00 - 0.07 K/uL    Comment: Performed at [facility], [address] [place], [place] [address]  Comprehensive metabolic panel     Status: Abnormal   Collection Time: 04/14/24 10:24 PM  Result Value Ref Range   Sodium 121 (L) 135 - 145 mmol/L   Potassium 3.4 (L) 3.5 - 5.1 mmol/L   Chloride 89 (L) 98 - 111 mmol/L   CO2 18 (L) 22 - 32 mmol/L   Glucose, Bld 107 (H) 70 - 99 mg/dL    Comment: Glucose reference range applies only to samples taken after fasting for at least 8 hours.   BUN <5 (L) 8 - 23 mg/dL   Creatinine, Ser 9.25 0.61 - 1.24 mg/dL   Calcium 8.7 (L) 8.9 - 10.3 mg/dL   Total Protein 7.2 6.5 - 8.1 g/dL   Albumin  3.9 3.5 - 5.0 g/dL   AST 48 (H) 15 - 41 U/L   ALT 24 0 - 44 U/L   Alkaline Phosphatase 121 38 - 126 U/L   Total Bilirubin 2.0 (H) 0.0 - 1.2 mg/dL   GFR, Estimated >39 >39 mL/min    Comment: (NOTE) Calculated using the  CKD-EPI Creatinine Equation (2021)    Anion gap 14 5 - 15    Comment: Performed at [facility], [address] [place], [place] [address]  Urinalysis, w/ Reflex to Culture (Infection Suspected) -Urine, Clean Catch     Status: None   Collection Time: 04/14/24 10:24 PM  Result Value Ref Range   Specimen Source URINE, CLEAN CATCH    Color, Urine YELLOW YELLOW   APPearance CLEAR CLEAR   Specific Gravity, Urine 1.013 1.005 - 1.030   pH 5.0 5.0 - 8.0   Glucose, UA NEGATIVE NEGATIVE mg/dL   Hgb urine dipstick NEGATIVE NEGATIVE   Bilirubin Urine NEGATIVE NEGATIVE   Ketones, ur NEGATIVE NEGATIVE mg/dL   Protein, ur NEGATIVE NEGATIVE mg/dL   Nitrite NEGATIVE NEGATIVE   Leukocytes,Ua NEGATIVE NEGATIVE   RBC / HPF 6-10 0 - 5 RBC/hpf   WBC, UA 0-5 0 - 5 WBC/hpf    Comment:        Reflex urine culture not performed if WBC <=10, OR if Squamous epithelial cells >5. If Squamous epithelial cells >5 suggest recollection.    Bacteria, UA NONE SEEN NONE SEEN   Squamous Epithelial / HPF 0-5 0 - 5 /HPF   Mucus PRESENT    Hyaline Casts, UA PRESENT     Comment: Performed at [facility], [address] [place], [place] [address]  Magnesium      Status: None   Collection Time: 04/14/24 10:24 PM  Result Value Ref Range   Magnesium  1.7 1.7 - 2.4 mg/dL    Comment: Performed at [facility], [address] [place], [place] [address]  TSH     Status: None   Collection Time: 04/15/24  5:42 AM  Result Value Ref Range   TSH 2.135 0.350 - 4.500 uIU/mL    Comment: Performed by a 3rd Generation assay with a functional sensitivity of <=0.01 uIU/mL. Performed at [facility], [address], [place], [place] [address]   Cortisol     Status: None   Collection Time: 04/15/24  5:42 AM  Result Value Ref Range   Cortisol, Plasma 15.2 ug/dL    Comment: (NOTE) AM    6.7 - 22.6 ug/dL PM   <89.9       ug/dL Performed at [facility], [address] [place], [place] [address]    Basic metabolic panel with GFR     Status: Abnormal   Collection Time: 04/15/24  5:42 AM  Result Value Ref Range  Sodium 122 (L) 135 - 145 mmol/L   Potassium 3.9 3.5 - 5.1 mmol/L   Chloride 91 (L) 98 - 111 mmol/L   CO2 21 (L) 22 - 32 mmol/L   Glucose, Bld 94 70 - 99 mg/dL    Comment: Glucose reference range applies only to samples taken after fasting for at least 8 hours.   BUN <5 (L) 8 - 23 mg/dL   Creatinine, Ser 9.49 (L) 0.61 - 1.24 mg/dL   Calcium 8.7 (L) 8.9 - 10.3 mg/dL   GFR, Estimated >39 >39 mL/min    Comment: (NOTE) Calculated using the CKD-EPI Creatinine Equation (2021)    Anion gap 10 5 - 15    Comment: Performed at [facility], [address] [place], [place] [address]  Osmolality     Status: Abnormal   Collection Time: 04/15/24  5:42 AM  Result Value Ref Range   Osmolality 257 (L) 275 - 295 mOsm/kg    Comment: Performed at [facility], [address], [place], [place] [address]  Basic metabolic panel with GFR     Status: Abnormal   Collection Time: 04/15/24 10:00 AM  Result Value Ref Range   Sodium 126 (L) 135 - 145 mmol/L   Potassium 3.6 3.5 - 5.1 mmol/L   Chloride 92 (L) 98 - 111 mmol/L   CO2 24 22 - 32 mmol/L   Glucose, Bld 112 (H) 70 - 99 mg/dL    Comment: Glucose reference range applies only to samples taken after fasting for at least 8 hours.   BUN 5 (L) 8 - 23 mg/dL   Creatinine, Ser 9.49 (L) 0.61 - 1.24 mg/dL   Calcium 8.6 (L) 8.9 - 10.3 mg/dL   GFR, Estimated >39 >39 mL/min    Comment: (NOTE) Calculated using the CKD-EPI Creatinine Equation (2021)    Anion gap 10 5 - 15    Comment: Performed at [facility], [address] [place], [place] [address]  Sodium, urine, random     Status: None   Collection Time: 04/15/24 10:00 AM  Result Value Ref Range   Sodium, Ur 26 mmol/L    Comment: REPEATED TO VERIFY Performed at [facility], [address] [place], [place] [address]   Osmolality, urine     Status: Abnormal    Collection Time: 04/15/24 10:00 AM  Result Value Ref Range   Osmolality, Ur 108 (L) 300 - 900 mOsm/kg    Comment: Performed at [facility], [address] [place], [place] [address]    VAS US  ABI WITH/WO TBI Result Date: 04/15/2024  LOWER EXTREMITY DOPPLER STUDY Patient Name:  AKIL Gerdeman  Date of Exam:   04/15/2024 Medical Rec #: [id number]    Accession #:    [id number] Date of Birth: 07-Dec-1955    Patient Gender: M Patient Age:   60 years Exam Location:  [facility] Procedure:      VAS US  ABI WITH/WO TBI Referring Phys: REDIA CLEAVER --------------------------------------------------------------------------------  Indications: BLE pain High Risk Factors: Hypertension, past history of smoking, prior CVA.  Comparison Study: No prior Performing Technologist: Jimmye Scarce RVT  Examination Guidelines: A complete evaluation includes at minimum, Doppler waveform signals and systolic blood pressure reading at the level of bilateral brachial, anterior tibial, and posterior tibial arteries, when vessel segments are accessible. Bilateral testing is considered an integral part of a complete examination. Photoelectric Plethysmograph (PPG) waveforms and toe systolic pressure readings are included as required  and additional duplex testing as needed. Limited examinations for reoccurring indications may be performed as noted.  ABI Findings: +---------+------------------+-----+---------+--------+ Right    Rt Pressure (mmHg)IndexWaveform Comment  +---------+------------------+-----+---------+--------+ Brachial                                 IV       +---------+------------------+-----+---------+--------+ PTA      200               1.33 biphasic          +---------+------------------+-----+---------+--------+ DP       193               1.29 triphasic         +---------+------------------+-----+---------+--------+ Great Toe124               0.83 Normal             +---------+------------------+-----+---------+--------+ +---------+------------------+-----+---------+-------+ Left     Lt Pressure (mmHg)IndexWaveform Comment +---------+------------------+-----+---------+-------+ Brachial 150                    triphasic        +---------+------------------+-----+---------+-------+ PTA      181               1.21 biphasic         +---------+------------------+-----+---------+-------+ DP       216               1.44 triphasic        +---------+------------------+-----+---------+-------+ Great Toe124               0.83 Normal           +---------+------------------+-----+---------+-------+ +-------+-----------+-----------+------------+------------+ ABI/TBIToday's ABIToday's TBIPrevious ABIPrevious TBI +-------+-----------+-----------+------------+------------+ Right  1.33       0.83                                +-------+-----------+-----------+------------+------------+ Left   1.44       0.83                                +-------+-----------+-----------+------------+------------+  Although bilateral ABI falls within normal limits, the left ABI is falsely elevated.  Summary: Right: Resting right ankle-brachial index is within normal range. The right toe-brachial index is normal. Left: The left toe-brachial index is normal. Left ankle brachial index is falsely elevated.  *See table(s) above for measurements and observations.  Electronically signed by Lonni Gaskins MD on 04/15/2024 at 11:57:41 AM.    Final    MR CERVICAL SPINE WO CONTRAST Result Date: 04/15/2024 EXAM: MRI CERVICAL SPINE WITHOUT CONTRAST 04/15/2024 07:15:00 AM TECHNIQUE: Multiplanar multisequence MRI of the cervical spine was performed. COMPARISON: CT of the cervical spine without contrast 04/14/2024. CLINICAL HISTORY: Myelopathy, acute, cervical spine. Chief Complaint: Weakness and lower extremity pain. FINDINGS: BONES AND ALIGNMENT: Mild straightening of the  normal cervical lordosis is stable. Retrolisthesis at C3-4 is stable. No acute soft tissue injury is present. SPINAL CORD: At C4-5, a central disc protrusion distorts the ventral surface of the cord. No abnormal cord signal is present. Cord signal and morphology is otherwise normal. SOFT TISSUES: No paraspinal mass. C2-C3: No significant disc herniation. No spinal canal stenosis or neural foraminal narrowing. C3-C4: A broad-based disc osteophyte complex is present. This results in effacement of  the ventral CSF with moderate central canal stenosis. Severe foraminal narrowing is worse right than left. C4-C5: A central disc protrusion distorts the ventral surface of the cord. The canal is narrowed to 6.5 mm. Severe left and moderate right foraminal stenosis is present. Moderate central canal stenosis is present. C5-C6: A broad-based disc osteophyte complex effaces the ventral CSF. Mild central canal stenosis is present. Moderate left and mild right foraminal narrowing is secondary to uncovertebral and facet disease. C6-C7: A rightward disc osteophyte complex is present. This partially effaces the ventral CSF. Moderate right and mild left foraminal narrowing is present. C7-T1: No significant disc herniation. No spinal canal stenosis or neural foraminal narrowing. IMPRESSION: 1. Moderate central canal stenosis at C3-4 and C4-5 with severe foraminal narrowing at C3-4 (worse right) and severe left foraminal stenosis at C4-5. No abnormal cord signal. 2. Mild central canal stenosis at C5-6 with moderate left and mild right foraminal narrowing. 3. Moderate right and mild left foraminal narrowing at C6-7. Electronically signed by: Lonni Necessary MD 04/15/2024 07:26 AM EDT RP Workstation: [id number]   US  Abdomen Limited RUQ (LIVER/GB) Result Date: 04/15/2024 CLINICAL DATA:  [id number]. Cholelithiasis on CT, with gallbladder wall thickening and cirrhosis. EXAM: ULTRASOUND ABDOMEN LIMITED RIGHT UPPER QUADRANT COMPARISON:  CT  earlier today. FINDINGS: Gallbladder: There is borderline gallbladder dilatation and multiple layering subcentimeter up to 1.1 cm stones. There is a thickened gallbladder free wall measuring 4.7 mm with minimal pericholecystic fluid. There was no positive sonographic Murphy's sign. In the setting of hepatic cirrhosis, the gallbladder thickening is more commonly due to congestion and liver dysfunction rather than cholecystitis, but clinical correlation is needed. Common bile duct: Diameter: 4.3 mm.  No intrahepatic bile duct prominence. Liver: No focal lesion identified. There is trace perihepatic ascites. The liver is mildly echogenic consistent with the mild fatty replacement noted on CT. Capsular nodularity of the liver on some of the images is consistent with cirrhosis. There is a slightly prominent hepatic portal vein, 14 mm. Portal vein is patent on color Doppler imaging with normal direction of blood flow towards the liver. Other: None. IMPRESSION: 1. Cholelithiasis with borderline gallbladder dilatation and thickened gallbladder free wall measuring 4.7 mm with minimal pericholecystic fluid. No positive sonographic Murphy's sign. In the setting of hepatic cirrhosis, the gallbladder thickening is more commonly due to congestion and liver dysfunction rather than cholecystitis, but clinical correlation is needed. 2. No bile duct dilatation. 3. Mildly echogenic liver consistent with fatty replacement. 4. Capsular nodularity of the liver on some images consistent with cirrhosis. 5. Trace perihepatic ascites. 6. Slightly prominent hepatic portal vein, 14 mm. No portal flow reversal. Electronically Signed   By: Francis Quam M.D.   On: 04/15/2024 07:00   CT ABDOMEN PELVIS W CONTRAST Result Date: 04/15/2024 CLINICAL DATA:  Abdominal pain, acute, nonlocalized, with generalized weakness and frequent falls and pain in the legs. Increasing low back pain felt related to falls. EXAM: CT ABDOMEN AND PELVIS WITH CONTRAST  CT LUMBAR SPINE WITHOUT CONTRAST TECHNIQUE: Multidetector CT imaging of the abdomen and pelvis was performed using the standard protocol following bolus administration of intravenous contrast. Multidetector CT imaging of the lumbar spine was performed without the use of contrast. Multiplanar CT image reconstructions were also generated and reviewed. RADIATION DOSE REDUCTION: This exam was performed according to the departmental dose-optimization program which includes automated exposure control, adjustment of the mA and/or kV according to patient size and/or use of iterative reconstruction technique. CONTRAST:  75mL OMNIPAQUE  IOHEXOL  350 MG/ML  SOLN COMPARISON:  CT chest, abdomen and pelvis with IV contrast studies dated 05/24/2023 and 03/20/2022. FINDINGS: CT ABDOMEN AND PELVIS WITH CONTRAST FINDINGS Lower chest: Minimal right and trace left layering pleural effusions are present, increased on the right since the last CT. There is posterior atelectasis in the lung bases without infiltrates. The cardiac size is normal. There are calcifications in the mitral ring and aortic valve leaflets. No substantial pericardial effusion. Hepatobiliary: The liver demonstrates capsular nodularity over portions consistent with at least mild cirrhosis. The liver is 19 cm length and mildly steatotic. A 1.3 cm periligamentous cyst, Hounsfield density is 8.4, is again noted in segment 3. There is no mass enhancement. There are small layering stones in the gallbladder, mild increased gallbladder thickening and trace pericholecystic fluid. In this case the gallbladder findings could be congestive. The hepatic portal vein is slightly prominent measuring 14 mm. Correlate clinically for acute cholecystitis. Pancreas: No abnormality. Spleen: Enlarged, again measuring 8.4 x 14.8 x 14.2 cm.  No mass. Adrenals/Urinary Tract: Symmetric unchanged perinephric fatty stranding. No further abnormality. Stomach/Bowel: No dilatation or wall thickening.  An appendix is not seen in this patient. Vascular/Lymphatic: Interval increased haziness and mildly enlarged lymph nodes in the mesenteric root fat, largest index node 1.1 cm short axis on 3:40. This could relate to mesenteric adenitis, mesenteric panniculitis, or portal or mesenteric congestion as the most likely etiologies, less likely would be lymphoproliferative disease or adenopathy due to inflammatory bowel disease. There is aortic and branch vessel atherosclerosis. Left common femoral artery is heavily calcified with an almost certain flow-limiting stenosis. As above the hepatic portal main vein is slightly prominent. There is also recanalization of the umbilical vein consistent with portal hypertension. No AAA or other significant vascular findings. Reproductive: Prostate is unremarkable. Other: Trace pelvic and retroperitoneal ascites. No drainable pocket. Musculoskeletal: Mild chronic anterior wedging L1 vertebral body. Osteopenia and degenerative change of the spine. No acute or other significant osseous findings. CT LUMBAR SPINE WITHOUT CONTRAST FINDINGS Segmentation: Standard. Alignment: Normal. Vertebrae: Osteopenia. There is mild chronic upper plate anterior wedge compression fracture of L1 vertebral body with 25% anterior and no posterior height loss or retropulsion. There is no new, acute or progressive compression deformity with mild chronic wedging of T12. There is moderate lumbar spondylosis. Osteophytic bridging is seen to the left and right laterally at L2-3. Paraspinal and other soft tissues: See above. Disc levels: The discs are normal in height at L1-2 and L3-4. There is variable disc space loss at all other levels from T11-12 down, with the greatest disc collapse chronically at L2-3 and with vacuum phenomenon at T12-L1, L4-5 and L5-S1. There are scattered endplate Schmorl's nodes. Disc space calcifications centrally again at T11-12. There is posterior endplate spurring O7-6, L3-4 and L4-5,  causing mild encroachment on the thecal sac but without critical spinal stenosis. There are mild facet spurring changes from L2-3 down. The foramina both moderately stenotic at L2-3, mildly stenotic at L3-4, moderately stenotic again at L4-5. Both SI joints are patent with mild spurring. The visualized sacrum is intact. Other: Comparison to the prior study reveals no significant interval change. IMPRESSION: 1. Minimal right and trace left layering pleural effusions, increased on the right since the last CT. 2. Cholelithiasis with mild increased gallbladder thickening and trace pericholecystic fluid. In this case the gallbladder findings could be congestive. Correlate clinically for acute cholecystitis. 3. Cirrhotic liver with splenomegaly and recanalization of the umbilical vein consistent with portal hypertension. Slightly prominent hepatic portal vein.  4. Trace pelvic and retroperitoneal ascites. 5. Interval increased haziness and mildly enlarged lymph nodes in the mesenteric root fat. This could relate to mesenteric adenitis, mesenteric panniculitis, or portal or mesenteric congestion as the most likely etiologies, less likely would be lymphoproliferative disease or adenopathy due to inflammatory bowel disease. 6. Aortic and branch vessel atherosclerosis. Left common femoral artery is heavily calcified with an almost certain flow-limiting stenosis. Are the patient's lower extremity symptoms greater on the left? 7. Osteopenia and degenerative change of the lumbar spine without evidence of acute fracture or progressive compression deformity. 8. Chronic mild upper plate anterior wedge compression fractures of T12 and L1. Aortic Atherosclerosis (ICD10-I70.0). Electronically Signed   By: Francis Quam M.D.   On: 04/15/2024 01:46   CT L-SPINE NO CHARGE Result Date: 04/15/2024 CLINICAL DATA:  Abdominal pain, acute, nonlocalized, with generalized weakness and frequent falls and pain in the legs. Increasing low back  pain felt related to falls. EXAM: CT ABDOMEN AND PELVIS WITH CONTRAST CT LUMBAR SPINE WITHOUT CONTRAST TECHNIQUE: Multidetector CT imaging of the abdomen and pelvis was performed using the standard protocol following bolus administration of intravenous contrast. Multidetector CT imaging of the lumbar spine was performed without the use of contrast. Multiplanar CT image reconstructions were also generated and reviewed. RADIATION DOSE REDUCTION: This exam was performed according to the departmental dose-optimization program which includes automated exposure control, adjustment of the mA and/or kV according to patient size and/or use of iterative reconstruction technique. CONTRAST:  75mL OMNIPAQUE  IOHEXOL  350 MG/ML SOLN COMPARISON:  CT chest, abdomen and pelvis with IV contrast studies dated 05/24/2023 and 03/20/2022. FINDINGS: CT ABDOMEN AND PELVIS WITH CONTRAST FINDINGS Lower chest: Minimal right and trace left layering pleural effusions are present, increased on the right since the last CT. There is posterior atelectasis in the lung bases without infiltrates. The cardiac size is normal. There are calcifications in the mitral ring and aortic valve leaflets. No substantial pericardial effusion. Hepatobiliary: The liver demonstrates capsular nodularity over portions consistent with at least mild cirrhosis. The liver is 19 cm length and mildly steatotic. A 1.3 cm periligamentous cyst, Hounsfield density is 8.4, is again noted in segment 3. There is no mass enhancement. There are small layering stones in the gallbladder, mild increased gallbladder thickening and trace pericholecystic fluid. In this case the gallbladder findings could be congestive. The hepatic portal vein is slightly prominent measuring 14 mm. Correlate clinically for acute cholecystitis. Pancreas: No abnormality. Spleen: Enlarged, again measuring 8.4 x 14.8 x 14.2 cm.  No mass. Adrenals/Urinary Tract: Symmetric unchanged perinephric fatty stranding. No  further abnormality. Stomach/Bowel: No dilatation or wall thickening. An appendix is not seen in this patient. Vascular/Lymphatic: Interval increased haziness and mildly enlarged lymph nodes in the mesenteric root fat, largest index node 1.1 cm short axis on 3:40. This could relate to mesenteric adenitis, mesenteric panniculitis, or portal or mesenteric congestion as the most likely etiologies, less likely would be lymphoproliferative disease or adenopathy due to inflammatory bowel disease. There is aortic and branch vessel atherosclerosis. Left common femoral artery is heavily calcified with an almost certain flow-limiting stenosis. As above the hepatic portal main vein is slightly prominent. There is also recanalization of the umbilical vein consistent with portal hypertension. No AAA or other significant vascular findings. Reproductive: Prostate is unremarkable. Other: Trace pelvic and retroperitoneal ascites. No drainable pocket. Musculoskeletal: Mild chronic anterior wedging L1 vertebral body. Osteopenia and degenerative change of the spine. No acute or other significant osseous findings. CT LUMBAR SPINE  WITHOUT CONTRAST FINDINGS Segmentation: Standard. Alignment: Normal. Vertebrae: Osteopenia. There is mild chronic upper plate anterior wedge compression fracture of L1 vertebral body with 25% anterior and no posterior height loss or retropulsion. There is no new, acute or progressive compression deformity with mild chronic wedging of T12. There is moderate lumbar spondylosis. Osteophytic bridging is seen to the left and right laterally at L2-3. Paraspinal and other soft tissues: See above. Disc levels: The discs are normal in height at L1-2 and L3-4. There is variable disc space loss at all other levels from T11-12 down, with the greatest disc collapse chronically at L2-3 and with vacuum phenomenon at T12-L1, L4-5 and L5-S1. There are scattered endplate Schmorl's nodes. Disc space calcifications centrally again  at T11-12. There is posterior endplate spurring O7-6, L3-4 and L4-5, causing mild encroachment on the thecal sac but without critical spinal stenosis. There are mild facet spurring changes from L2-3 down. The foramina both moderately stenotic at L2-3, mildly stenotic at L3-4, moderately stenotic again at L4-5. Both SI joints are patent with mild spurring. The visualized sacrum is intact. Other: Comparison to the prior study reveals no significant interval change. IMPRESSION: 1. Minimal right and trace left layering pleural effusions, increased on the right since the last CT. 2. Cholelithiasis with mild increased gallbladder thickening and trace pericholecystic fluid. In this case the gallbladder findings could be congestive. Correlate clinically for acute cholecystitis. 3. Cirrhotic liver with splenomegaly and recanalization of the umbilical vein consistent with portal hypertension. Slightly prominent hepatic portal vein. 4. Trace pelvic and retroperitoneal ascites. 5. Interval increased haziness and mildly enlarged lymph nodes in the mesenteric root fat. This could relate to mesenteric adenitis, mesenteric panniculitis, or portal or mesenteric congestion as the most likely etiologies, less likely would be lymphoproliferative disease or adenopathy due to inflammatory bowel disease. 6. Aortic and branch vessel atherosclerosis. Left common femoral artery is heavily calcified with an almost certain flow-limiting stenosis. Are the patient's lower extremity symptoms greater on the left? 7. Osteopenia and degenerative change of the lumbar spine without evidence of acute fracture or progressive compression deformity. 8. Chronic mild upper plate anterior wedge compression fractures of T12 and L1. Aortic Atherosclerosis (ICD10-I70.0). Electronically Signed   By: Francis Quam M.D.   On: 04/15/2024 01:46   DG Chest 2 View Result Date: 04/14/2024 CLINICAL DATA:  Weakness, multiple falls, cough EXAM: CHEST - 2 VIEW  COMPARISON:  05/24/2023 FINDINGS: Heart and mediastinal contours within limits. Linear bibasilar opacities, likely atelectasis. No effusions or pneumothorax. No acute bony abnormality. IMPRESSION: Bibasilar atelectasis. Electronically Signed   By: Franky Crease M.D.   On: 04/14/2024 23:05   CT Head Wo Contrast Result Date: 04/14/2024 CLINICAL DATA:  Blunt facial and head trauma, neck pain. EXAM: CT HEAD WITHOUT CONTRAST CT CERVICAL SPINE WITHOUT CONTRAST TECHNIQUE: Multidetector CT imaging of the head and cervical spine was performed following the standard protocol without intravenous contrast. Multiplanar CT image reconstructions of the cervical spine were also generated. RADIATION DOSE REDUCTION: This exam was performed according to the departmental dose-optimization program which includes automated exposure control, adjustment of the mA and/or kV according to patient size and/or use of iterative reconstruction technique. COMPARISON:  Head CT and cervical spine CT both 05/24/2023. FINDINGS: CT HEAD FINDINGS Brain: There is again noted moderate for age cerebral and mild cerebellar volume loss and moderately advanced for age small vessel disease. No cortical based acute infarct, hemorrhage, mass or mass effect is seen. There is mild atrophic ventriculomegaly. There is no  midline shift. The basal cisterns are patent. No old territorial infarct is seen. Vascular: There are calcifications in the carotid siphons but no hyperdense central vessel is seen. Skull: Negative for fractures or focal lesions. No appreciable scalp hematoma. Sinuses/Orbits: Negative orbits apart from interval lens extractions. There is patchy opacification of the left ethmoid air cells also new in the interval. Other paranasal sinuses, bilateral mastoid air cells, and middle ears are clear. Other: None. CT CERVICAL SPINE FINDINGS Alignment: Unchanged. Narrowing and spurring again noted of the anterior atlantodental joint and trace discogenic  retrolisthesis at C2-3. There is a straightened lordosis without traumatic or further listhesis. Skull base and vertebrae: No acute fracture is evident. No primary bone lesion or focal pathologic process. Soft tissues and spinal canal: No prevertebral fluid or swelling. No visible canal hematoma. There calcifications of the right greater than left proximal cervical ICAs. No laryngeal or thyroid  mass. Disc levels: The discs are normal in height at C2-3 and C7-T1, variably degenerated at the intervening levels with the greatest disc space loss again at C3-4. There is bidirectional endplate spurring most levels. There is mild encroachment on the ventral thecal sac at C3-4 due to broad disc osteophyte complex, C4-5 central disc protrusion impressing into the ventral cord surface in the midline, seen previously, with mild nonstenosing disc osteophyte complexes at C5-6 and C6-7. There is uncinate and facet joint spurring, with acquired foraminal stenosis which is bilaterally severe C3-4, mild on the left at C4-5. The other foramina are clear. Upper chest: Negative. Other: None. IMPRESSION: 1. No acute intracranial CT findings or depressed skull fractures. 2. Volume loss and small vessel disease. 3. Straightened lordosis without evidence of cervical fractures or traumatic listhesis. 4. Multilevel degenerative change, with C4-5 central disc protrusion impressing into the ventral cord surface in the midline, and bilaterally severe C3-4 foraminal stenosis. 5. Carotid atherosclerosis. Electronically Signed   By: Francis Quam M.D.   On: 04/14/2024 23:02   CT Cervical Spine Wo Contrast Result Date: 04/14/2024 CLINICAL DATA:  Blunt facial and head trauma, neck pain. EXAM: CT HEAD WITHOUT CONTRAST CT CERVICAL SPINE WITHOUT CONTRAST TECHNIQUE: Multidetector CT imaging of the head and cervical spine was performed following the standard protocol without intravenous contrast. Multiplanar CT image reconstructions of the cervical  spine were also generated. RADIATION DOSE REDUCTION: This exam was performed according to the departmental dose-optimization program which includes automated exposure control, adjustment of the mA and/or kV according to patient size and/or use of iterative reconstruction technique. COMPARISON:  Head CT and cervical spine CT both 05/24/2023. FINDINGS: CT HEAD FINDINGS Brain: There is again noted moderate for age cerebral and mild cerebellar volume loss and moderately advanced for age small vessel disease. No cortical based acute infarct, hemorrhage, mass or mass effect is seen. There is mild atrophic ventriculomegaly. There is no midline shift. The basal cisterns are patent. No old territorial infarct is seen. Vascular: There are calcifications in the carotid siphons but no hyperdense central vessel is seen. Skull: Negative for fractures or focal lesions. No appreciable scalp hematoma. Sinuses/Orbits: Negative orbits apart from interval lens extractions. There is patchy opacification of the left ethmoid air cells also new in the interval. Other paranasal sinuses, bilateral mastoid air cells, and middle ears are clear. Other: None. CT CERVICAL SPINE FINDINGS Alignment: Unchanged. Narrowing and spurring again noted of the anterior atlantodental joint and trace discogenic retrolisthesis at C2-3. There is a straightened lordosis without traumatic or further listhesis. Skull base and vertebrae: No acute fracture  is evident. No primary bone lesion or focal pathologic process. Soft tissues and spinal canal: No prevertebral fluid or swelling. No visible canal hematoma. There calcifications of the right greater than left proximal cervical ICAs. No laryngeal or thyroid  mass. Disc levels: The discs are normal in height at C2-3 and C7-T1, variably degenerated at the intervening levels with the greatest disc space loss again at C3-4. There is bidirectional endplate spurring most levels. There is mild encroachment on the ventral  thecal sac at C3-4 due to broad disc osteophyte complex, C4-5 central disc protrusion impressing into the ventral cord surface in the midline, seen previously, with mild nonstenosing disc osteophyte complexes at C5-6 and C6-7. There is uncinate and facet joint spurring, with acquired foraminal stenosis which is bilaterally severe C3-4, mild on the left at C4-5. The other foramina are clear. Upper chest: Negative. Other: None. IMPRESSION: 1. No acute intracranial CT findings or depressed skull fractures. 2. Volume loss and small vessel disease. 3. Straightened lordosis without evidence of cervical fractures or traumatic listhesis. 4. Multilevel degenerative change, with C4-5 central disc protrusion impressing into the ventral cord surface in the midline, and bilaterally severe C3-4 foraminal stenosis. 5. Carotid atherosclerosis. Electronically Signed   By: Francis Quam M.D.   On: 04/14/2024 23:02    Review of Systems  Constitutional:  Positive for activity change.  Musculoskeletal:  Positive for gait problem and myalgias.  All other systems reviewed and are negative.  Blood pressure 124/67, pulse 67, temperature 98.6 F (37 C), temperature source Oral, resp. rate 11, height 5' 9 (1.753 m), weight 76 kg, SpO2 100%. Physical Exam Constitutional:      Appearance: Normal appearance. He is normal weight.  HENT:     Head: Normocephalic and atraumatic.     Right Ear: Tympanic membrane normal.     Left Ear: Tympanic membrane normal.     Nose: Nose normal.     Mouth/Throat:     Mouth: Mucous membranes are moist.     Pharynx: Oropharynx is clear.  Eyes:     Extraocular Movements: Extraocular movements intact.     Conjunctiva/sclera: Conjunctivae normal.     Pupils: Pupils are equal, round, and reactive to light.  Abdominal:     General: Abdomen is flat. Bowel sounds are normal.     Palpations: Abdomen is soft.  Skin:    General: Skin is warm and dry.     Capillary Refill: Capillary refill takes  less than 2 seconds.  Neurological:     Mental Status: He is alert.     Comments: Gait is wide-based and patient's foot falls are very rigid and scissor like.  Tone appears increased in both his lower extremities in the iliopsoas quadriceps tibialis anterior and gastrocs strength is 4+ out of 5.  Balance is poor with a positive Romberg's test easy distractibility to get him off balance.  Sensation appears intact to pin and light touch.  Upper extremity strength is intact reflexes are absent in the biceps and the triceps bilaterally.     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  Ultimately I believe the patient will need to consider surgical decompression with a three-level anterior decompression arthrodesis C3-4 C4-5 and C5-6.  This can be scheduled electively once the patient is stabilized  medically will of course need to be off of his anticoagulated [place]ane agents if.  If his hospitalization will extend through the weekend he wishes to have surgery done during this hospitalization would likely be sometime next week.  Victory PARAS Windi Toro 04/15/2024, 4:03 PM

## 2024-04-15 NOTE — NC FL2 (Signed)
 [place]  MEDICAID FL2 LEVEL OF CARE FORM     IDENTIFICATION  Patient Name: Jeffrey Randolph Birthdate: 27-Jun-1956 Sex: male Admission Date (Current Location): 04/14/2024  [facility] and [place] Number:  Producer, television/film/video and Address:  [facility] [place], [address] [place], [place] [address]      Provider Number: [id number]  Attending Physician Name and Address:  Tobie Yetta HERO, MD  Relative Name and Phone Number:       Current Level of Care: Hospital Recommended Level of Care: Skilled Nursing Facility Prior Approval Number:    Date Approved/Denied:   PASRR Number: [id number]  Discharge Plan: SNF    Current Diagnoses: Patient Active Problem List   Diagnosis Date Noted   Low back pain 04/15/2024   Alcohol abuse 04/15/2024   Thrombocytopenia (HCC) 04/15/2024   Lower extremity pain 04/15/2024   RLS (restless legs syndrome) 04/15/2024   Hyponatremia 05/08/2023   History of alcohol abuse 05/08/2023   Diarrhea 05/08/2023   Weakness 05/08/2023   Ascitic fluid 03/20/2022   Hepatic cirrhosis (HCC) 03/20/2022   Atrial fibrillation, chronic (HCC) 03/20/2022   Fall at home, initial encounter 03/20/2022   Chest pain 03/20/2022   Secondary hypercoagulable state (HCC) 10/20/2019   Hypertension 05/26/2019   Hypokalemia 05/26/2019   History of stroke 05/26/2019   Hypomagnesemia 05/26/2019   Lumbar spondylosis 08/14/2015   Spinal stenosis of lumbar region 04/20/2015   HNP (herniated nucleus pulposus), lumbar 01/09/2015   Displacement of lumbar intervertebral disc without myelopathy 12/08/2014    Orientation RESPIRATION BLADDER Height & Weight     Self, Time, Place, Situation  Normal Continent Weight: 167 lb 8.8 oz (76 kg) Height:  5' 9 (175.3 cm)  BEHAVIORAL SYMPTOMS/MOOD NEUROLOGICAL BOWEL NUTRITION STATUS      Continent Diet (See discharge summary)  AMBULATORY STATUS COMMUNICATION OF NEEDS Skin   Limited Assist Verbally Normal                        Personal Care Assistance Level of Assistance  Bathing, Dressing, Feeding Bathing Assistance: Limited assistance Feeding assistance: Independent Dressing Assistance: Limited assistance     Functional Limitations Info  Hearing, Sight, Speech Sight Info: Impaired (Glasses) Hearing Info: Adequate Speech Info: Adequate    SPECIAL CARE FACTORS FREQUENCY  PT (By licensed PT), OT (By licensed OT)     PT Frequency: 5x weekly OT Frequency: 5x weekly            Contractures Contractures Info: Not present    Additional Factors Info  Code Status, Allergies Code Status Info: Full Code Allergies Info: No known allergies           Current Medications (04/15/2024):  This is the current hospital active medication list Current Facility-Administered Medications  Medication Dose Route Frequency Provider Last Rate Last Admin   acetaminophen  (TYLENOL ) tablet 500 mg  500 mg Oral Q6H PRN Patel, Pranav M, MD       apixaban  (ELIQUIS ) tablet 5 mg  5 mg Oral BID Kakrakandy, Arshad N, MD   5 mg at 04/15/24 0945   diltiazem  (CARDIZEM  CD) 24 hr capsule 240 mg  240 mg Oral Daily Franky Redia SAILOR, MD   240 mg at 04/15/24 1048   feeding supplement (ENSURE PLUS HIGH PROTEIN) liquid 237 mL  237 mL Oral TID BM Patel, Pranav M, MD       folic acid  (FOLVITE ) tablet 1 mg  1 mg Oral Daily Kakrakandy, Arshad N,  MD   1 mg at 04/15/24 0949   LORazepam  (ATIVAN ) tablet 1-4 mg  1-4 mg Oral Q1H PRN Kakrakandy, Arshad N, MD       Or   LORazepam  (ATIVAN ) injection 1-4 mg  1-4 mg Intravenous Q1H PRN Franky Redia SAILOR, MD       LORazepam  (ATIVAN ) tablet 0-4 mg  0-4 mg Oral Q6H Franky Redia SAILOR, MD       Followed by   NOREEN ON 04/17/2024] LORazepam  (ATIVAN ) tablet 0-4 mg  0-4 mg Oral Q12H Kakrakandy, Arshad N, MD       multivitamin with minerals tablet 1 tablet  1 tablet Oral Daily Franky Redia SAILOR, MD   1 tablet at 04/15/24 0945   oxyCODONE  (Oxy IR/ROXICODONE ) immediate release tablet 2.5 mg   2.5 mg Oral Q4H PRN Patel, Pranav M, MD       Or   oxyCODONE  (Oxy IR/ROXICODONE ) immediate release tablet 5 mg  5 mg Oral Q4H PRN Patel, Pranav M, MD       pantoprazole  (PROTONIX ) EC tablet 40 mg  40 mg Oral Daily Patel, Pranav M, MD   40 mg at 04/15/24 0945   pregabalin  (LYRICA ) capsule 200 mg  200 mg Oral TID Patel, Pranav M, MD       rOPINIRole  (REQUIP ) tablet 0.5 mg  0.5 mg Oral Daily PRN Franky Redia SAILOR, MD       thiamine  (VITAMIN B1) tablet 100 mg  100 mg Oral Daily Franky Redia SAILOR, MD   100 mg at 04/15/24 0945   Or   thiamine  (VITAMIN B1) injection 100 mg  100 mg Intravenous Daily Franky Redia SAILOR, MD       urea  (URE-NA) oral packet 15 g  15 g Oral BID Tobie Yetta HERO, MD       Current Outpatient Medications  Medication Sig Dispense Refill   acetaminophen  (TYLENOL ) 650 MG CR tablet Take 1,950 mg by mouth every 8 (eight) hours as needed for pain.     diltiazem  (CARDIZEM  CD) 240 MG 24 hr capsule Take 1 capsule (240 mg total) by mouth daily. 30 capsule 11   furosemide  (LASIX ) 40 MG tablet Take 40 mg by mouth daily.     methocarbamol (ROBAXIN) 500 MG tablet Take 500 mg by mouth every 6 (six) hours as needed for muscle spasms.     omeprazole (PRILOSEC) 40 MG capsule Take 40 mg by mouth daily.     pregabalin  (LYRICA ) 200 MG capsule Take 1 capsule (200 mg total) by mouth 3 (three) times daily. (Patient taking differently: Take 200 mg by mouth See admin instructions. Take 1 capsule (200mg ) by mouth once daily, may take up to 3 times daily as needed for pain, neuropathy.) 90 capsule 0   rOPINIRole  (REQUIP ) 0.5 MG tablet Take 1 mg by mouth at bedtime.     spironolactone  (ALDACTONE ) 25 MG tablet Take 25 mg by mouth daily.     apixaban  (ELIQUIS ) 5 MG TABS tablet Take 1 tablet (5 mg total) by mouth 2 (two) times daily for 30 days. (Patient not taking: Reported on 04/15/2024) 60 tablet 0     Discharge Medications: Please see discharge summary for a list of discharge  medications.  Relevant Imaging Results:  Relevant Lab Results:   Additional Information SSN [id number]  Niels LITTIE [place], [place]

## 2024-04-15 NOTE — Progress Notes (Signed)
 ABI exam completed. Gentry Pilson, RVT

## 2024-04-15 NOTE — H&P (Addendum)
 History and Physical    Jeffrey Randolph FMW:[id number] DOB: 02-05-1956 DOA: 04/14/2024  Patient coming from: Home.  Chief Complaint: Weakness and lower extremity pain.  HPI: Jeffrey Randolph is a 68 y.o. male with history of alcoholic liver cirrhosis, atrial fibrillation, history of stroke, neuropathy, HFpEF presents to the ER because of increasing pain of the lower extremity and weakness.  Patient states he has been having frequent falls due to instability.  He states he may have hit his head at times.  Did not lose consciousness.  Denies any chest pain or palpitations.  Patient states recently he was restarted on his diuretics both Lasix  and spironolactone  which he has been taking for a month.  Over the last 1 week he has not taken Eliquis  because of increasing bruising.  He takes Lyrica  intermittently and not the dose he was prescribed.  Complains of worsening bilateral lower extremity pain extending from his low back.  Patient admits to drinking alcohol every day.  Patient was admitted in August 2024 for hyponatremia eventually treated with urea  and fluid restriction.  ED Course: In the ER CT head and C-spine did not show anything acute.  CT abdomen pelvis shows fluid overload with pleural effusion but also thickening of the gallbladder could be from fluid.  Also shows flow limitation of the left femoral artery and also enlarged mesenteric lymph nodes which will need follow-up.  Patient's sodium levels 121 potassium was 3.4.  EKG shows A-fib rate controlled.  Review of Systems: As per HPI, rest all negative.   Past Medical History:  Diagnosis Date   Atrial fibrillation with rapid ventricular response (HCC)    BPH (benign prostatic hyperplasia)    CVA (cerebral infarction)    Dysrhythmia    GERD (gastroesophageal reflux disease)    History of stroke 05/26/2019   HNP (herniated nucleus pulposus), lumbar 01/09/2015   Hypertension    Hypokalemia    Hypomagnesemia 05/26/2019   Stroke (HCC)    Left  foot always feel 2 x larger, walks with a limp.    Past Surgical History:  Procedure Laterality Date   ESOPHAGOGASTRODUODENOSCOPY (EGD) WITH PROPOFOL  N/A 01/06/2023   Procedure: ESOPHAGOGASTRODUODENOSCOPY (EGD) WITH PROPOFOL ;  Surgeon: [name], MD;  Location: [facility] ENDOSCOPY;  Service: Gastroenterology;  Laterality: N/A;  Wants close to 9 AM arrival   LUMBAR LAMINECTOMY/DECOMPRESSION MICRODISCECTOMY Right 01/09/2015   Procedure: LUMBAR LAMINECTOMY/DECOMPRESSION MICRODISCECTOMY 1 LEVEL;  Surgeon: [name], MD;  Location: [facility] NEURO ORS;  Service: Neurosurgery;  Laterality: Right;  LUMBAR LAMINECTOMY/DECOMPRESSION MICRODISCECTOMY 1 LEVEL RIGHT LUMBAR 2-3   TONSILLECTOMY       reports that he has quit smoking. His smoking use included cigarettes. He has never used smokeless tobacco. He reports that he does not currently use alcohol. He reports that he does not use drugs.  No Known Allergies  Family History  Problem Relation Age of Onset   Pancreatic cancer Mother     Prior to Admission medications   Medication Sig Start Date End Date Taking? Authorizing Provider  apixaban  (ELIQUIS ) 5 MG TABS tablet Take 1 tablet (5 mg total) by mouth 2 (two) times daily for 30 days. 12/22/18 07/19/23  Theadore Ozell HERO, MD  diltiazem  (CARDIZEM  CD) 240 MG 24 hr capsule Take 1 capsule (240 mg total) by mouth daily. 12/01/20   Fenton, Clint R, PA  DULoxetine  (CYMBALTA ) 30 MG capsule Take 30 mg by mouth daily.    [provider]  lactose free nutrition (BOOST) LIQD Take 1 Container by mouth 2 (  two) times daily.    [provider]  midodrine  (PROAMATINE ) 5 MG tablet Take 1 tablet (5 mg total) by mouth 3 (three) times daily with meals. 05/29/23   Ghimire, Donalda HERO, MD  omeprazole (PRILOSEC) 40 MG capsule Take 40 mg by mouth daily as needed (heartburn).    [provider]  pregabalin  (LYRICA ) 200 MG capsule Take 1 capsule (200 mg total) by mouth 3 (three) times daily. 05/28/23   Ghimire,  Donalda HERO, MD  rOPINIRole  (REQUIP ) 0.5 MG tablet Take 0.5 mg by mouth daily as needed (restless legs).    [provider]  urea  (URE-NA) 15 g PACK oral packet Take 15 g by mouth 2 (two) times daily. 05/29/23   Ghimire, Donalda HERO, MD    Physical Exam: Constitutional: Moderately built and nourished. Vitals:   04/14/24 2300 04/15/24 0000 04/15/24 0012 04/15/24 0230  BP: 96/71 105/65  103/61  Pulse: 60 62  63  Resp: 17 17  15   Temp:  97.7 F (36.5 C)    TempSrc:  Oral    SpO2: 99% 99%  97%  Weight:   76 kg   Height:   5' 9 (1.753 m)    Eyes: Anicteric no pallor. ENMT: No discharge from the ears eyes nose or mouth. Neck: No mass felt.  No neck rigidity. Respiratory: No rhonchi or crepitations. Cardiovascular: S1-S2 heard. Abdomen: Soft nontender bowel sound present. Musculoskeletal: Mild edema of the lower extremities. Skin: No rash. Neurologic: Alert awake oriented time place and person.  Moves all extremities. Psychiatric: Appears normal.  Normal affect.   Labs on Admission: I have personally reviewed following labs and imaging studies  CBC: Recent Labs  Lab 04/14/24 2224  WBC 6.6  NEUTROABS 3.9  HGB 11.3*  HCT 32.7*  MCV 106.2*  PLT 145*   Basic Metabolic Panel: Recent Labs  Lab 04/14/24 2224  NA 121*  K 3.4*  CL 89*  CO2 18*  GLUCOSE 107*  BUN <5*  CREATININE 0.74  CALCIUM 8.7*  MG 1.7   GFR: Estimated Creatinine Clearance: 88.4 mL/min (by C-G formula based on SCr of 0.74 mg/dL). Liver Function Tests: Recent Labs  Lab 04/14/24 2224  AST 48*  ALT 24  ALKPHOS 121  BILITOT 2.0*  PROT 7.2  ALBUMIN  3.9   No results for input(s): LIPASE, AMYLASE in the last 168 hours. No results for input(s): AMMONIA in the last 168 hours. Coagulation Profile: No results for input(s): INR, PROTIME in the last 168 hours. Cardiac Enzymes: No results for input(s): CKTOTAL, CKMB, CKMBINDEX, TROPONINI in the last 168 hours. BNP (last 3  results) No results for input(s): PROBNP in the last 8760 hours. HbA1C: No results for input(s): HGBA1C in the last 72 hours. CBG: No results for input(s): GLUCAP in the last 168 hours. Lipid Profile: No results for input(s): CHOL, HDL, LDLCALC, TRIG, CHOLHDL, LDLDIRECT in the last 72 hours. Thyroid  Function Tests: No results for input(s): TSH, T4TOTAL, FREET4, T3FREE, THYROIDAB in the last 72 hours. Anemia Panel: No results for input(s): VITAMINB12, FOLATE, FERRITIN, TIBC, IRON, RETICCTPCT in the last 72 hours. Urine analysis:    Component Value Date/Time   COLORURINE YELLOW 04/14/2024 2224   APPEARANCEUR CLEAR 04/14/2024 2224   LABSPEC 1.013 04/14/2024 2224   PHURINE 5.0 04/14/2024 2224   GLUCOSEU NEGATIVE 04/14/2024 2224   HGBUR NEGATIVE 04/14/2024 2224   BILIRUBINUR NEGATIVE 04/14/2024 2224   KETONESUR NEGATIVE 04/14/2024 2224   PROTEINUR NEGATIVE 04/14/2024 2224   UROBILINOGEN 0.2 08/03/2011 1937  NITRITE NEGATIVE 04/14/2024 2224   LEUKOCYTESUR NEGATIVE 04/14/2024 2224   Sepsis Labs: @LABRCNTIP (procalcitonin:4,lacticidven:4) )No results found for this or any previous visit (from the past 240 hours).   Radiological Exams on Admission: CT ABDOMEN PELVIS W CONTRAST Result Date: 04/15/2024 CLINICAL DATA:  Abdominal pain, acute, nonlocalized, with generalized weakness and frequent falls and pain in the legs. Increasing low back pain felt related to falls. EXAM: CT ABDOMEN AND PELVIS WITH CONTRAST CT LUMBAR SPINE WITHOUT CONTRAST TECHNIQUE: Multidetector CT imaging of the abdomen and pelvis was performed using the standard protocol following bolus administration of intravenous contrast. Multidetector CT imaging of the lumbar spine was performed without the use of contrast. Multiplanar CT image reconstructions were also generated and reviewed. RADIATION DOSE REDUCTION: This exam was performed according to the departmental dose-optimization  program which includes automated exposure control, adjustment of the mA and/or kV according to patient size and/or use of iterative reconstruction technique. CONTRAST:  75mL OMNIPAQUE  IOHEXOL  350 MG/ML SOLN COMPARISON:  CT chest, abdomen and pelvis with IV contrast studies dated 05/24/2023 and 03/20/2022. FINDINGS: CT ABDOMEN AND PELVIS WITH CONTRAST FINDINGS Lower chest: Minimal right and trace left layering pleural effusions are present, increased on the right since the last CT. There is posterior atelectasis in the lung bases without infiltrates. The cardiac size is normal. There are calcifications in the mitral ring and aortic valve leaflets. No substantial pericardial effusion. Hepatobiliary: The liver demonstrates capsular nodularity over portions consistent with at least mild cirrhosis. The liver is 19 cm length and mildly steatotic. A 1.3 cm periligamentous cyst, Hounsfield density is 8.4, is again noted in segment 3. There is no mass enhancement. There are small layering stones in the gallbladder, mild increased gallbladder thickening and trace pericholecystic fluid. In this case the gallbladder findings could be congestive. The hepatic portal vein is slightly prominent measuring 14 mm. Correlate clinically for acute cholecystitis. Pancreas: No abnormality. Spleen: Enlarged, again measuring 8.4 x 14.8 x 14.2 cm.  No mass. Adrenals/Urinary Tract: Symmetric unchanged perinephric fatty stranding. No further abnormality. Stomach/Bowel: No dilatation or wall thickening. An appendix is not seen in this patient. Vascular/Lymphatic: Interval increased haziness and mildly enlarged lymph nodes in the mesenteric root fat, largest index node 1.1 cm short axis on 3:40. This could relate to mesenteric adenitis, mesenteric panniculitis, or portal or mesenteric congestion as the most likely etiologies, less likely would be lymphoproliferative disease or adenopathy due to inflammatory bowel disease. There is aortic and  branch vessel atherosclerosis. Left common femoral artery is heavily calcified with an almost certain flow-limiting stenosis. As above the hepatic portal main vein is slightly prominent. There is also recanalization of the umbilical vein consistent with portal hypertension. No AAA or other significant vascular findings. Reproductive: Prostate is unremarkable. Other: Trace pelvic and retroperitoneal ascites. No drainable pocket. Musculoskeletal: Mild chronic anterior wedging L1 vertebral body. Osteopenia and degenerative change of the spine. No acute or other significant osseous findings. CT LUMBAR SPINE WITHOUT CONTRAST FINDINGS Segmentation: Standard. Alignment: Normal. Vertebrae: Osteopenia. There is mild chronic upper plate anterior wedge compression fracture of L1 vertebral body with 25% anterior and no posterior height loss or retropulsion. There is no new, acute or progressive compression deformity with mild chronic wedging of T12. There is moderate lumbar spondylosis. Osteophytic bridging is seen to the left and right laterally at L2-3. Paraspinal and other soft tissues: See above. Disc levels: The discs are normal in height at L1-2 and L3-4. There is variable disc space loss at all other levels  from T11-12 down, with the greatest disc collapse chronically at L2-3 and with vacuum phenomenon at T12-L1, L4-5 and L5-S1. There are scattered endplate Schmorl's nodes. Disc space calcifications centrally again at T11-12. There is posterior endplate spurring O7-6, L3-4 and L4-5, causing mild encroachment on the thecal sac but without critical spinal stenosis. There are mild facet spurring changes from L2-3 down. The foramina both moderately stenotic at L2-3, mildly stenotic at L3-4, moderately stenotic again at L4-5. Both SI joints are patent with mild spurring. The visualized sacrum is intact. Other: Comparison to the prior study reveals no significant interval change. IMPRESSION: 1. Minimal right and trace left  layering pleural effusions, increased on the right since the last CT. 2. Cholelithiasis with mild increased gallbladder thickening and trace pericholecystic fluid. In this case the gallbladder findings could be congestive. Correlate clinically for acute cholecystitis. 3. Cirrhotic liver with splenomegaly and recanalization of the umbilical vein consistent with portal hypertension. Slightly prominent hepatic portal vein. 4. Trace pelvic and retroperitoneal ascites. 5. Interval increased haziness and mildly enlarged lymph nodes in the mesenteric root fat. This could relate to mesenteric adenitis, mesenteric panniculitis, or portal or mesenteric congestion as the most likely etiologies, less likely would be lymphoproliferative disease or adenopathy due to inflammatory bowel disease. 6. Aortic and branch vessel atherosclerosis. Left common femoral artery is heavily calcified with an almost certain flow-limiting stenosis. Are the patient's lower extremity symptoms greater on the left? 7. Osteopenia and degenerative change of the lumbar spine without evidence of acute fracture or progressive compression deformity. 8. Chronic mild upper plate anterior wedge compression fractures of T12 and L1. Aortic Atherosclerosis (ICD10-I70.0). Electronically Signed   By: Francis Quam M.D.   On: 04/15/2024 01:46   CT L-SPINE NO CHARGE Result Date: 04/15/2024 CLINICAL DATA:  Abdominal pain, acute, nonlocalized, with generalized weakness and frequent falls and pain in the legs. Increasing low back pain felt related to falls. EXAM: CT ABDOMEN AND PELVIS WITH CONTRAST CT LUMBAR SPINE WITHOUT CONTRAST TECHNIQUE: Multidetector CT imaging of the abdomen and pelvis was performed using the standard protocol following bolus administration of intravenous contrast. Multidetector CT imaging of the lumbar spine was performed without the use of contrast. Multiplanar CT image reconstructions were also generated and reviewed. RADIATION DOSE  REDUCTION: This exam was performed according to the departmental dose-optimization program which includes automated exposure control, adjustment of the mA and/or kV according to patient size and/or use of iterative reconstruction technique. CONTRAST:  75mL OMNIPAQUE  IOHEXOL  350 MG/ML SOLN COMPARISON:  CT chest, abdomen and pelvis with IV contrast studies dated 05/24/2023 and 03/20/2022. FINDINGS: CT ABDOMEN AND PELVIS WITH CONTRAST FINDINGS Lower chest: Minimal right and trace left layering pleural effusions are present, increased on the right since the last CT. There is posterior atelectasis in the lung bases without infiltrates. The cardiac size is normal. There are calcifications in the mitral ring and aortic valve leaflets. No substantial pericardial effusion. Hepatobiliary: The liver demonstrates capsular nodularity over portions consistent with at least mild cirrhosis. The liver is 19 cm length and mildly steatotic. A 1.3 cm periligamentous cyst, Hounsfield density is 8.4, is again noted in segment 3. There is no mass enhancement. There are small layering stones in the gallbladder, mild increased gallbladder thickening and trace pericholecystic fluid. In this case the gallbladder findings could be congestive. The hepatic portal vein is slightly prominent measuring 14 mm. Correlate clinically for acute cholecystitis. Pancreas: No abnormality. Spleen: Enlarged, again measuring 8.4 x 14.8 x 14.2 cm.  No  mass. Adrenals/Urinary Tract: Symmetric unchanged perinephric fatty stranding. No further abnormality. Stomach/Bowel: No dilatation or wall thickening. An appendix is not seen in this patient. Vascular/Lymphatic: Interval increased haziness and mildly enlarged lymph nodes in the mesenteric root fat, largest index node 1.1 cm short axis on 3:40. This could relate to mesenteric adenitis, mesenteric panniculitis, or portal or mesenteric congestion as the most likely etiologies, less likely would be  lymphoproliferative disease or adenopathy due to inflammatory bowel disease. There is aortic and branch vessel atherosclerosis. Left common femoral artery is heavily calcified with an almost certain flow-limiting stenosis. As above the hepatic portal main vein is slightly prominent. There is also recanalization of the umbilical vein consistent with portal hypertension. No AAA or other significant vascular findings. Reproductive: Prostate is unremarkable. Other: Trace pelvic and retroperitoneal ascites. No drainable pocket. Musculoskeletal: Mild chronic anterior wedging L1 vertebral body. Osteopenia and degenerative change of the spine. No acute or other significant osseous findings. CT LUMBAR SPINE WITHOUT CONTRAST FINDINGS Segmentation: Standard. Alignment: Normal. Vertebrae: Osteopenia. There is mild chronic upper plate anterior wedge compression fracture of L1 vertebral body with 25% anterior and no posterior height loss or retropulsion. There is no new, acute or progressive compression deformity with mild chronic wedging of T12. There is moderate lumbar spondylosis. Osteophytic bridging is seen to the left and right laterally at L2-3. Paraspinal and other soft tissues: See above. Disc levels: The discs are normal in height at L1-2 and L3-4. There is variable disc space loss at all other levels from T11-12 down, with the greatest disc collapse chronically at L2-3 and with vacuum phenomenon at T12-L1, L4-5 and L5-S1. There are scattered endplate Schmorl's nodes. Disc space calcifications centrally again at T11-12. There is posterior endplate spurring O7-6, L3-4 and L4-5, causing mild encroachment on the thecal sac but without critical spinal stenosis. There are mild facet spurring changes from L2-3 down. The foramina both moderately stenotic at L2-3, mildly stenotic at L3-4, moderately stenotic again at L4-5. Both SI joints are patent with mild spurring. The visualized sacrum is intact. Other: Comparison to the  prior study reveals no significant interval change. IMPRESSION: 1. Minimal right and trace left layering pleural effusions, increased on the right since the last CT. 2. Cholelithiasis with mild increased gallbladder thickening and trace pericholecystic fluid. In this case the gallbladder findings could be congestive. Correlate clinically for acute cholecystitis. 3. Cirrhotic liver with splenomegaly and recanalization of the umbilical vein consistent with portal hypertension. Slightly prominent hepatic portal vein. 4. Trace pelvic and retroperitoneal ascites. 5. Interval increased haziness and mildly enlarged lymph nodes in the mesenteric root fat. This could relate to mesenteric adenitis, mesenteric panniculitis, or portal or mesenteric congestion as the most likely etiologies, less likely would be lymphoproliferative disease or adenopathy due to inflammatory bowel disease. 6. Aortic and branch vessel atherosclerosis. Left common femoral artery is heavily calcified with an almost certain flow-limiting stenosis. Are the patient's lower extremity symptoms greater on the left? 7. Osteopenia and degenerative change of the lumbar spine without evidence of acute fracture or progressive compression deformity. 8. Chronic mild upper plate anterior wedge compression fractures of T12 and L1. Aortic Atherosclerosis (ICD10-I70.0). Electronically Signed   By: Francis Quam M.D.   On: 04/15/2024 01:46   DG Chest 2 View Result Date: 04/14/2024 CLINICAL DATA:  Weakness, multiple falls, cough EXAM: CHEST - 2 VIEW COMPARISON:  05/24/2023 FINDINGS: Heart and mediastinal contours within limits. Linear bibasilar opacities, likely atelectasis. No effusions or pneumothorax. No acute bony abnormality.  IMPRESSION: Bibasilar atelectasis. Electronically Signed   By: Franky Crease M.D.   On: 04/14/2024 23:05   CT Head Wo Contrast Result Date: 04/14/2024 CLINICAL DATA:  Blunt facial and head trauma, neck pain. EXAM: CT HEAD WITHOUT CONTRAST  CT CERVICAL SPINE WITHOUT CONTRAST TECHNIQUE: Multidetector CT imaging of the head and cervical spine was performed following the standard protocol without intravenous contrast. Multiplanar CT image reconstructions of the cervical spine were also generated. RADIATION DOSE REDUCTION: This exam was performed according to the departmental dose-optimization program which includes automated exposure control, adjustment of the mA and/or kV according to patient size and/or use of iterative reconstruction technique. COMPARISON:  Head CT and cervical spine CT both 05/24/2023. FINDINGS: CT HEAD FINDINGS Brain: There is again noted moderate for age cerebral and mild cerebellar volume loss and moderately advanced for age small vessel disease. No cortical based acute infarct, hemorrhage, mass or mass effect is seen. There is mild atrophic ventriculomegaly. There is no midline shift. The basal cisterns are patent. No old territorial infarct is seen. Vascular: There are calcifications in the carotid siphons but no hyperdense central vessel is seen. Skull: Negative for fractures or focal lesions. No appreciable scalp hematoma. Sinuses/Orbits: Negative orbits apart from interval lens extractions. There is patchy opacification of the left ethmoid air cells also new in the interval. Other paranasal sinuses, bilateral mastoid air cells, and middle ears are clear. Other: None. CT CERVICAL SPINE FINDINGS Alignment: Unchanged. Narrowing and spurring again noted of the anterior atlantodental joint and trace discogenic retrolisthesis at C2-3. There is a straightened lordosis without traumatic or further listhesis. Skull base and vertebrae: No acute fracture is evident. No primary bone lesion or focal pathologic process. Soft tissues and spinal canal: No prevertebral fluid or swelling. No visible canal hematoma. There calcifications of the right greater than left proximal cervical ICAs. No laryngeal or thyroid  mass. Disc levels: The discs  are normal in height at C2-3 and C7-T1, variably degenerated at the intervening levels with the greatest disc space loss again at C3-4. There is bidirectional endplate spurring most levels. There is mild encroachment on the ventral thecal sac at C3-4 due to broad disc osteophyte complex, C4-5 central disc protrusion impressing into the ventral cord surface in the midline, seen previously, with mild nonstenosing disc osteophyte complexes at C5-6 and C6-7. There is uncinate and facet joint spurring, with acquired foraminal stenosis which is bilaterally severe C3-4, mild on the left at C4-5. The other foramina are clear. Upper chest: Negative. Other: None. IMPRESSION: 1. No acute intracranial CT findings or depressed skull fractures. 2. Volume loss and small vessel disease. 3. Straightened lordosis without evidence of cervical fractures or traumatic listhesis. 4. Multilevel degenerative change, with C4-5 central disc protrusion impressing into the ventral cord surface in the midline, and bilaterally severe C3-4 foraminal stenosis. 5. Carotid atherosclerosis. Electronically Signed   By: Francis Quam M.D.   On: 04/14/2024 23:02   CT Cervical Spine Wo Contrast Result Date: 04/14/2024 CLINICAL DATA:  Blunt facial and head trauma, neck pain. EXAM: CT HEAD WITHOUT CONTRAST CT CERVICAL SPINE WITHOUT CONTRAST TECHNIQUE: Multidetector CT imaging of the head and cervical spine was performed following the standard protocol without intravenous contrast. Multiplanar CT image reconstructions of the cervical spine were also generated. RADIATION DOSE REDUCTION: This exam was performed according to the departmental dose-optimization program which includes automated exposure control, adjustment of the mA and/or kV according to patient size and/or use of iterative reconstruction technique. COMPARISON:  Head CT and  cervical spine CT both 05/24/2023. FINDINGS: CT HEAD FINDINGS Brain: There is again noted moderate for age cerebral and  mild cerebellar volume loss and moderately advanced for age small vessel disease. No cortical based acute infarct, hemorrhage, mass or mass effect is seen. There is mild atrophic ventriculomegaly. There is no midline shift. The basal cisterns are patent. No old territorial infarct is seen. Vascular: There are calcifications in the carotid siphons but no hyperdense central vessel is seen. Skull: Negative for fractures or focal lesions. No appreciable scalp hematoma. Sinuses/Orbits: Negative orbits apart from interval lens extractions. There is patchy opacification of the left ethmoid air cells also new in the interval. Other paranasal sinuses, bilateral mastoid air cells, and middle ears are clear. Other: None. CT CERVICAL SPINE FINDINGS Alignment: Unchanged. Narrowing and spurring again noted of the anterior atlantodental joint and trace discogenic retrolisthesis at C2-3. There is a straightened lordosis without traumatic or further listhesis. Skull base and vertebrae: No acute fracture is evident. No primary bone lesion or focal pathologic process. Soft tissues and spinal canal: No prevertebral fluid or swelling. No visible canal hematoma. There calcifications of the right greater than left proximal cervical ICAs. No laryngeal or thyroid  mass. Disc levels: The discs are normal in height at C2-3 and C7-T1, variably degenerated at the intervening levels with the greatest disc space loss again at C3-4. There is bidirectional endplate spurring most levels. There is mild encroachment on the ventral thecal sac at C3-4 due to broad disc osteophyte complex, C4-5 central disc protrusion impressing into the ventral cord surface in the midline, seen previously, with mild nonstenosing disc osteophyte complexes at C5-6 and C6-7. There is uncinate and facet joint spurring, with acquired foraminal stenosis which is bilaterally severe C3-4, mild on the left at C4-5. The other foramina are clear. Upper chest: Negative. Other: None.  IMPRESSION: 1. No acute intracranial CT findings or depressed skull fractures. 2. Volume loss and small vessel disease. 3. Straightened lordosis without evidence of cervical fractures or traumatic listhesis. 4. Multilevel degenerative change, with C4-5 central disc protrusion impressing into the ventral cord surface in the midline, and bilaterally severe C3-4 foraminal stenosis. 5. Carotid atherosclerosis. Electronically Signed   By: Francis Quam M.D.   On: 04/14/2024 23:02    EKG: Independently reviewed.  A-fib rate controlled.  Assessment/Plan Principal Problem:   Hyponatremia Active Problems:   Hepatic cirrhosis (HCC)   Hypertension   History of stroke   Atrial fibrillation, chronic (HCC)   Low back pain   Alcohol abuse   Thrombocytopenia (HCC)   Lower extremity pain   RLS (restless legs syndrome)    Severe hyponatremia -      has had prior history of hyponatremia was admitted in August 2024.  At that time it was attributed to fluid overload.  I think patient is clinically fluid overloaded and also alcohol could be contributing.  Will restrict fluids 1200 cc/day and keep patient on Lasix .  Hold spironolactone  in the setting of hyponatremia.  Check urine osmolality, urine sodium, serum osmolality, TSH and cortisol levels.  Check serial metabolic panel. Lower extremity pain with CT abdomen/pelvis showing flow limitation in the left femoral artery.  CT lumbar spine does not show any acute fractures.  Does show some chronic changes in the T12 and L1.  C-spine CT scan shows C4-C5 central disc protrusion impressing on the ventral cord will check MRI.  Will check ABI.  Pain may be also related to patient not taking proper dose of Lyrica   for the neuropathy. Decompensated liver cirrhosis with ascites seen on the CAT scan.  Check PT/INR to calculate MELD score.  Will continue Lasix  given the fluid overload.  Holding spironolactone  due to hyponatremia.  Will check right upper quadrant ultrasound  given the thickening of the gallbladder seen on the CAT scan. Alcohol abuse on CIWA protocol.  Advised about quitting.  Thiamine . History of stroke has not taken Eliquis  last 1 week but agreeable to continue.  Check PT/INR. History of A-fib on Cardizem  for rate control.  Restarting Eliquis .  Follow PT/INR. History of peripheral neuropathy takes Lyrica  in a different dose.  Discussed with pharmacy reviewed PDMP website.  For now we are keeping Lyrica  100 mg twice daily. Macrocytic anemia check B12 folate levels.  Follow CBC. History of HFpEF.  On Lasix . Thrombocytopenia likely from alcohol use and cirrhosis.  Follow CBC. Restless leg syndrome on Requip . Hypertension on Cardizem . Possible mesenteric adenopathy will need further workup as outpatient.  Since patient has severe hyponatremia with lower extremity pain will need further management close observation and more than 2 midnight stay.   DVT prophylaxis: Eliquis .  Follow PT/INR. Code Status: Full code. Family Communication: Discussed with patient. Disposition Plan: Monitored bed. Consults called: Physical therapy. Admission status: Observation.

## 2024-04-16 ENCOUNTER — Other Ambulatory Visit (HOSPITAL_COMMUNITY): Payer: Self-pay

## 2024-04-16 DIAGNOSIS — M4712 Other spondylosis with myelopathy, cervical region: Secondary | ICD-10-CM | POA: Diagnosis not present

## 2024-04-16 DIAGNOSIS — E871 Hypo-osmolality and hyponatremia: Secondary | ICD-10-CM | POA: Diagnosis not present

## 2024-04-16 LAB — PHOSPHORUS: Phosphorus: 3 mg/dL (ref 2.5–4.6)

## 2024-04-16 LAB — HEPATIC FUNCTION PANEL
ALT: 24 U/L (ref 0–44)
AST: 52 U/L — ABNORMAL HIGH (ref 15–41)
Albumin: 3.6 g/dL (ref 3.5–5.0)
Alkaline Phosphatase: 129 U/L — ABNORMAL HIGH (ref 38–126)
Bilirubin, Direct: 0.5 mg/dL — ABNORMAL HIGH (ref 0.0–0.2)
Indirect Bilirubin: 1.9 mg/dL — ABNORMAL HIGH (ref 0.3–0.9)
Total Bilirubin: 2.4 mg/dL — ABNORMAL HIGH (ref 0.0–1.2)
Total Protein: 6.8 g/dL (ref 6.5–8.1)

## 2024-04-16 LAB — MAGNESIUM: Magnesium: 1.9 mg/dL (ref 1.7–2.4)

## 2024-04-16 LAB — BASIC METABOLIC PANEL WITH GFR
Anion gap: 13 (ref 5–15)
BUN: <5 mg/dL — ABNORMAL LOW (ref 8–23)
CO2: 20 mmol/L — ABNORMAL LOW (ref 22–32)
Calcium: 8.9 mg/dL (ref 8.9–10.3)
Chloride: 95 mmol/L — ABNORMAL LOW (ref 98–111)
Creatinine, Ser: 0.49 mg/dL — ABNORMAL LOW (ref 0.61–1.24)
GFR, Estimated: >60 mL/min (ref >60)
Glucose, Bld: 124 mg/dL — ABNORMAL HIGH (ref 70–99)
Potassium: 3.9 mmol/L (ref 3.5–5.1)
Sodium: 128 mmol/L — ABNORMAL LOW (ref 135–145)

## 2024-04-16 LAB — VITAMIN B12: Vitamin B-12: 327 pg/mL (ref 180–914)

## 2024-04-16 LAB — AMMONIA: Ammonia: 69 umol/L — ABNORMAL HIGH (ref 9–35)

## 2024-04-16 MED ORDER — ADULT MULTIVITAMIN W/MINERALS CH: 1.0000 | ORAL_TABLET | Freq: Every day | ORAL | Status: DC

## 2024-04-16 MED ORDER — FOLIC ACID 1 MG PO TABS
1.0000 mg | ORAL_TABLET | Freq: Every day | ORAL | Status: DC
Start: 2024-04-17 — End: 2024-05-25

## 2024-04-16 MED ORDER — LACTULOSE 10 GM/15ML PO SOLN
20.0000 g | Freq: Two times a day (BID) | ORAL | 0 refills | Status: AC
  Filled 2024-04-16: qty 946, 16d supply, fill #0

## 2024-04-16 MED ORDER — ACETAMINOPHEN 500 MG PO TABS: 500.0000 mg | ORAL_TABLET | Freq: Four times a day (QID) | ORAL | Status: DC | PRN

## 2024-04-16 MED ORDER — LACTULOSE 10 GM/15ML PO SOLN
20.0000 g | Freq: Two times a day (BID) | ORAL | Status: DC
  Administered 2024-04-16: 20 g via ORAL
  Filled 2024-04-16: qty 30

## 2024-04-16 MED ORDER — OXYCODONE HCL 5 MG PO TABS
2.5000 mg | ORAL_TABLET | Freq: Four times a day (QID) | ORAL | 0 refills | Status: AC | PRN
  Filled 2024-04-16: qty 10, 5d supply, fill #0

## 2024-04-16 MED ORDER — POTASSIUM CHLORIDE CRYS ER 20 MEQ PO TBCR
40.0000 meq | EXTENDED_RELEASE_TABLET | Freq: Four times a day (QID) | ORAL | Status: DC
  Administered 2024-04-16: 40 meq via ORAL
  Filled 2024-04-16: qty 2

## 2024-04-16 MED ORDER — VITAMIN B-1 100 MG PO TABS: 100.0000 mg | ORAL_TABLET | Freq: Every day | ORAL | Status: DC

## 2024-04-16 MED ORDER — VITAMIN B-12 1000 MCG PO TABS
1000.0000 ug | ORAL_TABLET | Freq: Every day | ORAL | 0 refills | Status: AC
  Filled 2024-04-16: qty 30, 30d supply, fill #0

## 2024-04-16 NOTE — Progress Notes (Signed)
 Pt refusing lab draw this morning. Pt was reminded and educated that he had blood draw due every 6 hours to check sodium levels. Pt aware and not willing at this time.

## 2024-04-16 NOTE — Progress Notes (Signed)
 Patient ID: Jeffrey Randolph, male   DOB: 11-Jul-1956, 68 y.o.   MRN: [id number] The patient is awake and alert and sitting at the side of the bed much as I found him yesterday in the emergency department.  He is able to stand and walk albeit he is now aware that he has some issues in the cervical spinal canal.  Once he is cleared medically we can consider surgical decompression of his cervical spine although this can be arranged for a separate visit.  He is not keen on staying in the hospital over the weekend and he may be discharged if little else is to be gained by keeping him here.  I can see him as an outpatient for further follow-up.

## 2024-04-16 NOTE — Discharge Summary (Signed)
 Physician Discharge Summary  Jeffrey Randolph FMW:[id number] DOB: 1956-07-01 DOA: 04/14/2024  PCP: Montey Lot, PA-C  Admit date: 04/14/2024 Discharge date: 04/16/2024  Admitted From: (Home) Disposition:  (Home)  Recommendations for Outpatient Follow-up:  Follow up with PCP in 1-2 weeks Please obtain CMP/CBC/ammonia in one week Was instructed to follow with neurosurgery as an outpatient Please continue counseling about alcohol use Patient will need further workup for mesenteric adenopathy, please consider referral for GI referral as an outpatient.  Home Health: PT/OT recommendation has been made for SNF which he declined, we have offered him home health but he declined as well - I have offered the patient to stay in yesterday in the hospital, but he is adamant about leaving home today.  Diet recommendation: Heart Healthy /2 g sodium Brief/Interim Summary:   Witten Certain is a 68 y.o. male with history of alcoholic liver cirrhosis, atrial fibrillation, history of stroke, neuropathy, HFpEF presents to the ER because of increasing pain of the lower extremity and weakness.  Patient states he has been having frequent falls due to instability.  He states he may have hit his head at times.  Did not lose consciousness.  Denies any chest pain or palpitations.  Patient states recently he was restarted on his diuretics both Lasix  and spironolactone  which he has been taking for a month.  Over the last 1 week he has not taken Eliquis  because of increasing bruising.  He takes Lyrica  intermittently and not the dose he was prescribed.  Complains of worsening bilateral lower extremity pain extending from his low back.  Patient admits to drinking alcohol every day.   Patient was admitted in August 2024 for hyponatremia eventually treated with urea  and fluid restriction.    In the ER CT head and C-spine did not show anything acute.  CT abdomen pelvis shows fluid overload with pleural effusion but also thickening of  the gallbladder could be from fluid.  Also shows flow limitation of the left femoral artery and also enlarged mesenteric lymph nodes which will need follow-up.  Patient's sodium levels 121 potassium was 3.4.  EKG shows A-fib rate controlled.   Severe hyponatremia -  - Likely due to beer potomania, proving, it is 128 on discharge -cortisol and TSH within normal limit  Decompensated liver cirrhosis  hyperammonemia transaminitis  -with ascites seen on the CAT scan.   -Patient reports he is unaware of history of cirrhosis, prior to minimizing his alcohol use, he was counseled at length regarding that, I have explained for him imaging and labs . - Ammonia level was elevated so he was started on lactulose  -Continue with Aldactone  and Lasix  on discharge  Recurrent falls /cervical radiculopathy  - Neurosurgery input greatly appreciated, MRI cervical spine with evidence of high-grade canal stenosis, neurosurgery discussed with patient about surgery, he would like to defer as an outpatient, so neurosurgery information has been given to patient on discharge Juliane appointment .  Cholelithiasis with gallbladder wall thickening. Patient does not have any evidence of abdominal pain, nausea vomiting fever or chills. Less likely cholecystitis and more likely edema in the setting of cirrhosis.  Alcohol abuse on CIWA protocol.  Advised about quitting.  Thiamine . History of stroke has not taken Eliquis  last 1 week but agreeable to continue.  Check PT/INR. History of A-fib on Cardizem  for rate control.  Restarting Eliquis .  Follow PT/INR. History of peripheral neuropathy  -tcontinue with Lyrica  Macrocytic anemia 12 level is borderline, started on supplements History of HFpEF.  On Lasix . Thrombocytopenia  likely from alcohol use and cirrhosis.   Restless leg syndrome on Requip . Hypertension on Cardizem . Possible mesenteric adenopathy will need further workup as outpatient.  Discharge Diagnoses:   Principal Problem:   Hyponatremia Active Problems:   Hepatic cirrhosis (HCC)   Hypertension   History of stroke   Atrial fibrillation, chronic (HCC)   Low back pain   Alcohol abuse   Thrombocytopenia (HCC)   Lower extremity pain   RLS (restless legs syndrome)    Discharge Instructions  Discharge Instructions     Diet - low sodium heart healthy   Complete by: As directed    Discharge instructions   Complete by: As directed    Follow with Primary MD Montey Lot, PA-C in 7 days   Get CBC, CMP,  checked  by Primary MD next visit.    Activity: As tolerated with Full fall precautions use walker/cane & assistance as needed   Disposition Home    Diet: Heart Healthy /2 gm sodium diet   On your next visit with your primary care physician please Get Medicines reviewed and adjusted.   Please request your Prim.MD to go over all Hospital Tests and Procedure/Radiological results at the follow up, please get all Hospital records sent to your Prim MD by signing hospital release before you go home.   If you experience worsening of your admission symptoms, develop shortness of breath, life threatening emergency, suicidal or homicidal thoughts you must seek medical attention immediately by calling 911 or calling your MD immediately  if symptoms less severe.  You Must read complete instructions/literature along with all the possible adverse reactions/side effects for all the Medicines you take and that have been prescribed to you. Take any new Medicines after you have completely understood and accpet all the possible adverse reactions/side effects.   Do not drive, operating heavy machinery, perform activities at heights, swimming or participation in water activities or provide baby sitting services if your were admitted for syncope or siezures until you have seen by Primary MD or a Neurologist and advised to do so again.  Do not drive when taking Pain medications.    Do not take  more than prescribed Pain, Sleep and Anxiety Medications  Special Instructions: If you have smoked or chewed Tobacco  in the last 2 yrs please stop smoking, stop any regular Alcohol  and or any Recreational drug use.  Wear Seat belts while driving.   Please note  You were cared for by a hospitalist during your hospital stay. If you have any questions about your discharge medications or the care you received while you were in the hospital after you are discharged, you can call the unit and asked to speak with the hospitalist on call if the hospitalist that took care of you is not available. Once you are discharged, your primary care physician will handle any further medical issues. Please note that NO REFILLS for any discharge medications will be authorized once you are discharged, as it is imperative that you return to your primary care physician (or establish a relationship with a primary care physician if you do not have one) for your aftercare needs so that they can reassess your need for medications and monitor your lab values.   Increase activity slowly   Complete by: As directed       Allergies as of 04/16/2024   No Known Allergies      Medication List     STOP taking these medications    acetaminophen   650 MG CR tablet Commonly known as: TYLENOL  Replaced by: acetaminophen  500 MG tablet       TAKE these medications    acetaminophen  500 MG tablet Commonly known as: TYLENOL  Take 1 tablet (500 mg total) by mouth every 6 (six) hours as needed for mild pain (pain score 1-3), fever or headache. Replaces: acetaminophen  650 MG CR tablet   apixaban  5 MG Tabs tablet Commonly known as: ELIQUIS  Take 1 tablet (5 mg total) by mouth 2 (two) times daily for 30 days.   cyanocobalamin  1000 MCG tablet Commonly known as: VITAMIN B12 Take 1 tablet (1,000 mcg total) by mouth daily.   diltiazem  240 MG 24 hr capsule Commonly known as: CARDIZEM  CD Take 1 capsule (240 mg total) by mouth  daily.   folic acid  1 MG tablet Commonly known as: FOLVITE  Take 1 tablet (1 mg total) by mouth daily. Start taking on: April 17, 2024   furosemide  40 MG tablet Commonly known as: LASIX  Take 40 mg by mouth daily.   lactulose  10 GM/15ML solution Commonly known as: CHRONULAC  Take 30 mLs (20 g total) by mouth 2 (two) times daily for 15 days.   methocarbamol 500 MG tablet Commonly known as: ROBAXIN Take 500 mg by mouth every 6 (six) hours as needed for muscle spasms.   multivitamin with minerals Tabs tablet Take 1 tablet by mouth daily. Start taking on: April 17, 2024   omeprazole 40 MG capsule Commonly known as: PRILOSEC Take 40 mg by mouth daily.   oxyCODONE  5 MG immediate release tablet Commonly known as: Oxy IR/ROXICODONE  Take 0.5 tablets (2.5 mg total) by mouth every 6 (six) hours as needed for up to 5 days for severe pain (pain score 7-10).   pregabalin  200 MG capsule Commonly known as: LYRICA  Take 1 capsule (200 mg total) by mouth 3 (three) times daily. What changed:  when to take this additional instructions   rOPINIRole  0.5 MG tablet Commonly known as: REQUIP  Take 1 mg by mouth at bedtime.   spironolactone  25 MG tablet Commonly known as: ALDACTONE  Take 25 mg by mouth daily.   thiamine  100 MG tablet Commonly known as: Vitamin B-1 Take 1 tablet (100 mg total) by mouth daily. Start taking on: April 17, 2024        Follow-up Information     Colon Shove, MD Follow up.   Specialty: Neurosurgery Why: Please call for an appointment to schedule cervical spine surgery evaluation Contact information: [address] [place] [address] [phone]         Montey Lot, PA-C Follow up in 1 week(s).   Specialty: Physician Assistant Contact information: [address] [place] [address] [phone]                No Known Allergies  Consultations: Neurosurgery   Procedures/Studies: VAS US  ABI WITH/WO TBI Result  Date: 04/15/2024  LOWER EXTREMITY DOPPLER STUDY Patient Name:  ANDREU Whittenberg  Date of Exam:   04/15/2024 Medical Rec #: [id number]    Accession #:    [id number] Date of Birth: [birth year]    Patient Gender: M Patient Age:   30 years Exam Location:  [facility] Procedure:      VAS US  ABI WITH/WO TBI Referring Phys: REDIA CLEAVER --------------------------------------------------------------------------------  Indications: BLE pain High Risk Factors: Hypertension, past history of smoking, prior CVA.  Comparison Study: No prior Performing Technologist: Jimmye Scarce RVT  Examination Guidelines: A complete evaluation includes at minimum, Doppler waveform signals  and systolic blood pressure reading at the level of bilateral brachial, anterior tibial, and posterior tibial arteries, when vessel segments are accessible. Bilateral testing is considered an integral part of a complete examination. Photoelectric Plethysmograph (PPG) waveforms and toe systolic pressure readings are included as required and additional duplex testing as needed. Limited examinations for reoccurring indications may be performed as noted.  ABI Findings: +---------+------------------+-----+---------+--------+ Right    Rt Pressure (mmHg)IndexWaveform Comment  +---------+------------------+-----+---------+--------+ Brachial                                 IV       +---------+------------------+-----+---------+--------+ PTA      200               1.33 biphasic          +---------+------------------+-----+---------+--------+ DP       193               1.29 triphasic         +---------+------------------+-----+---------+--------+ Great Toe124               0.83 Normal            +---------+------------------+-----+---------+--------+ +---------+------------------+-----+---------+-------+ Left     Lt Pressure (mmHg)IndexWaveform Comment +---------+------------------+-----+---------+-------+ Brachial 150                     triphasic        +---------+------------------+-----+---------+-------+ PTA      181               1.21 biphasic         +---------+------------------+-----+---------+-------+ DP       216               1.44 triphasic        +---------+------------------+-----+---------+-------+ Great Toe124               0.83 Normal           +---------+------------------+-----+---------+-------+ +-------+-----------+-----------+------------+------------+ ABI/TBIToday's ABIToday's TBIPrevious ABIPrevious TBI +-------+-----------+-----------+------------+------------+ Right  1.33       0.83                                +-------+-----------+-----------+------------+------------+ Left   1.44       0.83                                +-------+-----------+-----------+------------+------------+  Although bilateral ABI falls within normal limits, the left ABI is falsely elevated.  Summary: Right: Resting right ankle-brachial index is within normal range. The right toe-brachial index is normal. Left: The left toe-brachial index is normal. Left ankle brachial index is falsely elevated.  *See table(s) above for measurements and observations.  Electronically signed by Lonni Gaskins MD on 04/15/2024 at 11:57:41 AM.    Final    MR CERVICAL SPINE WO CONTRAST Result Date: 04/15/2024 EXAM: MRI CERVICAL SPINE WITHOUT CONTRAST 04/15/2024 07:15:00 AM TECHNIQUE: Multiplanar multisequence MRI of the cervical spine was performed. COMPARISON: CT of the cervical spine without contrast 04/14/2024. CLINICAL HISTORY: Myelopathy, acute, cervical spine. Chief Complaint: Weakness and lower extremity pain. FINDINGS: BONES AND ALIGNMENT: Mild straightening of the normal cervical lordosis is stable. Retrolisthesis at C3-4 is stable. No acute soft tissue injury is present. SPINAL CORD: At C4-5, a central disc protrusion distorts the ventral surface of the  cord. No abnormal cord signal is present. Cord signal  and morphology is otherwise normal. SOFT TISSUES: No paraspinal mass. C2-C3: No significant disc herniation. No spinal canal stenosis or neural foraminal narrowing. C3-C4: A broad-based disc osteophyte complex is present. This results in effacement of the ventral CSF with moderate central canal stenosis. Severe foraminal narrowing is worse right than left. C4-C5: A central disc protrusion distorts the ventral surface of the cord. The canal is narrowed to 6.5 mm. Severe left and moderate right foraminal stenosis is present. Moderate central canal stenosis is present. C5-C6: A broad-based disc osteophyte complex effaces the ventral CSF. Mild central canal stenosis is present. Moderate left and mild right foraminal narrowing is secondary to uncovertebral and facet disease. C6-C7: A rightward disc osteophyte complex is present. This partially effaces the ventral CSF. Moderate right and mild left foraminal narrowing is present. C7-T1: No significant disc herniation. No spinal canal stenosis or neural foraminal narrowing. IMPRESSION: 1. Moderate central canal stenosis at C3-4 and C4-5 with severe foraminal narrowing at C3-4 (worse right) and severe left foraminal stenosis at C4-5. No abnormal cord signal. 2. Mild central canal stenosis at C5-6 with moderate left and mild right foraminal narrowing. 3. Moderate right and mild left foraminal narrowing at C6-7. Electronically signed by: Lonni Necessary MD 04/15/2024 07:26 AM EDT RP Workstation: [id number]   US  Abdomen Limited RUQ (LIVER/GB) Result Date: 04/15/2024 CLINICAL DATA:  [id number]. Cholelithiasis on CT, with gallbladder wall thickening and cirrhosis. EXAM: ULTRASOUND ABDOMEN LIMITED RIGHT UPPER QUADRANT COMPARISON:  CT earlier today. FINDINGS: Gallbladder: There is borderline gallbladder dilatation and multiple layering subcentimeter up to 1.1 cm stones. There is a thickened gallbladder free wall measuring 4.7 mm with minimal pericholecystic fluid. There was no  positive sonographic Murphy's sign. In the setting of hepatic cirrhosis, the gallbladder thickening is more commonly due to congestion and liver dysfunction rather than cholecystitis, but clinical correlation is needed. Common bile duct: Diameter: 4.3 mm.  No intrahepatic bile duct prominence. Liver: No focal lesion identified. There is trace perihepatic ascites. The liver is mildly echogenic consistent with the mild fatty replacement noted on CT. Capsular nodularity of the liver on some of the images is consistent with cirrhosis. There is a slightly prominent hepatic portal vein, 14 mm. Portal vein is patent on color Doppler imaging with normal direction of blood flow towards the liver. Other: None. IMPRESSION: 1. Cholelithiasis with borderline gallbladder dilatation and thickened gallbladder free wall measuring 4.7 mm with minimal pericholecystic fluid. No positive sonographic Murphy's sign. In the setting of hepatic cirrhosis, the gallbladder thickening is more commonly due to congestion and liver dysfunction rather than cholecystitis, but clinical correlation is needed. 2. No bile duct dilatation. 3. Mildly echogenic liver consistent with fatty replacement. 4. Capsular nodularity of the liver on some images consistent with cirrhosis. 5. Trace perihepatic ascites. 6. Slightly prominent hepatic portal vein, 14 mm. No portal flow reversal. Electronically Signed   By: Francis Quam M.D.   On: 04/15/2024 07:00   CT ABDOMEN PELVIS W CONTRAST Result Date: 04/15/2024 CLINICAL DATA:  Abdominal pain, acute, nonlocalized, with generalized weakness and frequent falls and pain in the legs. Increasing low back pain felt related to falls. EXAM: CT ABDOMEN AND PELVIS WITH CONTRAST CT LUMBAR SPINE WITHOUT CONTRAST TECHNIQUE: Multidetector CT imaging of the abdomen and pelvis was performed using the standard protocol following bolus administration of intravenous contrast. Multidetector CT imaging of the lumbar spine was  performed without the use of contrast. Multiplanar CT image reconstructions  were also generated and reviewed. RADIATION DOSE REDUCTION: This exam was performed according to the departmental dose-optimization program which includes automated exposure control, adjustment of the mA and/or kV according to patient size and/or use of iterative reconstruction technique. CONTRAST:  75mL OMNIPAQUE  IOHEXOL  350 MG/ML SOLN COMPARISON:  CT chest, abdomen and pelvis with IV contrast studies dated 05/24/2023 and 03/20/2022. FINDINGS: CT ABDOMEN AND PELVIS WITH CONTRAST FINDINGS Lower chest: Minimal right and trace left layering pleural effusions are present, increased on the right since the last CT. There is posterior atelectasis in the lung bases without infiltrates. The cardiac size is normal. There are calcifications in the mitral ring and aortic valve leaflets. No substantial pericardial effusion. Hepatobiliary: The liver demonstrates capsular nodularity over portions consistent with at least mild cirrhosis. The liver is 19 cm length and mildly steatotic. A 1.3 cm periligamentous cyst, Hounsfield density is 8.4, is again noted in segment 3. There is no mass enhancement. There are small layering stones in the gallbladder, mild increased gallbladder thickening and trace pericholecystic fluid. In this case the gallbladder findings could be congestive. The hepatic portal vein is slightly prominent measuring 14 mm. Correlate clinically for acute cholecystitis. Pancreas: No abnormality. Spleen: Enlarged, again measuring 8.4 x 14.8 x 14.2 cm.  No mass. Adrenals/Urinary Tract: Symmetric unchanged perinephric fatty stranding. No further abnormality. Stomach/Bowel: No dilatation or wall thickening. An appendix is not seen in this patient. Vascular/Lymphatic: Interval increased haziness and mildly enlarged lymph nodes in the mesenteric root fat, largest index node 1.1 cm short axis on 3:40. This could relate to mesenteric adenitis,  mesenteric panniculitis, or portal or mesenteric congestion as the most likely etiologies, less likely would be lymphoproliferative disease or adenopathy due to inflammatory bowel disease. There is aortic and branch vessel atherosclerosis. Left common femoral artery is heavily calcified with an almost certain flow-limiting stenosis. As above the hepatic portal main vein is slightly prominent. There is also recanalization of the umbilical vein consistent with portal hypertension. No AAA or other significant vascular findings. Reproductive: Prostate is unremarkable. Other: Trace pelvic and retroperitoneal ascites. No drainable pocket. Musculoskeletal: Mild chronic anterior wedging L1 vertebral body. Osteopenia and degenerative change of the spine. No acute or other significant osseous findings. CT LUMBAR SPINE WITHOUT CONTRAST FINDINGS Segmentation: Standard. Alignment: Normal. Vertebrae: Osteopenia. There is mild chronic upper plate anterior wedge compression fracture of L1 vertebral body with 25% anterior and no posterior height loss or retropulsion. There is no new, acute or progressive compression deformity with mild chronic wedging of T12. There is moderate lumbar spondylosis. Osteophytic bridging is seen to the left and right laterally at L2-3. Paraspinal and other soft tissues: See above. Disc levels: The discs are normal in height at L1-2 and L3-4. There is variable disc space loss at all other levels from T11-12 down, with the greatest disc collapse chronically at L2-3 and with vacuum phenomenon at T12-L1, L4-5 and L5-S1. There are scattered endplate Schmorl's nodes. Disc space calcifications centrally again at T11-12. There is posterior endplate spurring O7-6, L3-4 and L4-5, causing mild encroachment on the thecal sac but without critical spinal stenosis. There are mild facet spurring changes from L2-3 down. The foramina both moderately stenotic at L2-3, mildly stenotic at L3-4, moderately stenotic again at  L4-5. Both SI joints are patent with mild spurring. The visualized sacrum is intact. Other: Comparison to the prior study reveals no significant interval change. IMPRESSION: 1. Minimal right and trace left layering pleural effusions, increased on the right since the last  CT. 2. Cholelithiasis with mild increased gallbladder thickening and trace pericholecystic fluid. In this case the gallbladder findings could be congestive. Correlate clinically for acute cholecystitis. 3. Cirrhotic liver with splenomegaly and recanalization of the umbilical vein consistent with portal hypertension. Slightly prominent hepatic portal vein. 4. Trace pelvic and retroperitoneal ascites. 5. Interval increased haziness and mildly enlarged lymph nodes in the mesenteric root fat. This could relate to mesenteric adenitis, mesenteric panniculitis, or portal or mesenteric congestion as the most likely etiologies, less likely would be lymphoproliferative disease or adenopathy due to inflammatory bowel disease. 6. Aortic and branch vessel atherosclerosis. Left common femoral artery is heavily calcified with an almost certain flow-limiting stenosis. Are the patient's lower extremity symptoms greater on the left? 7. Osteopenia and degenerative change of the lumbar spine without evidence of acute fracture or progressive compression deformity. 8. Chronic mild upper plate anterior wedge compression fractures of T12 and L1. Aortic Atherosclerosis (ICD10-I70.0). Electronically Signed   By: Francis Quam M.D.   On: 04/15/2024 01:46   CT L-SPINE NO CHARGE Result Date: 04/15/2024 CLINICAL DATA:  Abdominal pain, acute, nonlocalized, with generalized weakness and frequent falls and pain in the legs. Increasing low back pain felt related to falls. EXAM: CT ABDOMEN AND PELVIS WITH CONTRAST CT LUMBAR SPINE WITHOUT CONTRAST TECHNIQUE: Multidetector CT imaging of the abdomen and pelvis was performed using the standard protocol following bolus administration  of intravenous contrast. Multidetector CT imaging of the lumbar spine was performed without the use of contrast. Multiplanar CT image reconstructions were also generated and reviewed. RADIATION DOSE REDUCTION: This exam was performed according to the departmental dose-optimization program which includes automated exposure control, adjustment of the mA and/or kV according to patient size and/or use of iterative reconstruction technique. CONTRAST:  75mL OMNIPAQUE  IOHEXOL  350 MG/ML SOLN COMPARISON:  CT chest, abdomen and pelvis with IV contrast studies dated 05/24/2023 and 03/20/2022. FINDINGS: CT ABDOMEN AND PELVIS WITH CONTRAST FINDINGS Lower chest: Minimal right and trace left layering pleural effusions are present, increased on the right since the last CT. There is posterior atelectasis in the lung bases without infiltrates. The cardiac size is normal. There are calcifications in the mitral ring and aortic valve leaflets. No substantial pericardial effusion. Hepatobiliary: The liver demonstrates capsular nodularity over portions consistent with at least mild cirrhosis. The liver is 19 cm length and mildly steatotic. A 1.3 cm periligamentous cyst, Hounsfield density is 8.4, is again noted in segment 3. There is no mass enhancement. There are small layering stones in the gallbladder, mild increased gallbladder thickening and trace pericholecystic fluid. In this case the gallbladder findings could be congestive. The hepatic portal vein is slightly prominent measuring 14 mm. Correlate clinically for acute cholecystitis. Pancreas: No abnormality. Spleen: Enlarged, again measuring 8.4 x 14.8 x 14.2 cm.  No mass. Adrenals/Urinary Tract: Symmetric unchanged perinephric fatty stranding. No further abnormality. Stomach/Bowel: No dilatation or wall thickening. An appendix is not seen in this patient. Vascular/Lymphatic: Interval increased haziness and mildly enlarged lymph nodes in the mesenteric root fat, largest index node  1.1 cm short axis on 3:40. This could relate to mesenteric adenitis, mesenteric panniculitis, or portal or mesenteric congestion as the most likely etiologies, less likely would be lymphoproliferative disease or adenopathy due to inflammatory bowel disease. There is aortic and branch vessel atherosclerosis. Left common femoral artery is heavily calcified with an almost certain flow-limiting stenosis. As above the hepatic portal main vein is slightly prominent. There is also recanalization of the umbilical vein consistent with portal  hypertension. No AAA or other significant vascular findings. Reproductive: Prostate is unremarkable. Other: Trace pelvic and retroperitoneal ascites. No drainable pocket. Musculoskeletal: Mild chronic anterior wedging L1 vertebral body. Osteopenia and degenerative change of the spine. No acute or other significant osseous findings. CT LUMBAR SPINE WITHOUT CONTRAST FINDINGS Segmentation: Standard. Alignment: Normal. Vertebrae: Osteopenia. There is mild chronic upper plate anterior wedge compression fracture of L1 vertebral body with 25% anterior and no posterior height loss or retropulsion. There is no new, acute or progressive compression deformity with mild chronic wedging of T12. There is moderate lumbar spondylosis. Osteophytic bridging is seen to the left and right laterally at L2-3. Paraspinal and other soft tissues: See above. Disc levels: The discs are normal in height at L1-2 and L3-4. There is variable disc space loss at all other levels from T11-12 down, with the greatest disc collapse chronically at L2-3 and with vacuum phenomenon at T12-L1, L4-5 and L5-S1. There are scattered endplate Schmorl's nodes. Disc space calcifications centrally again at T11-12. There is posterior endplate spurring O7-6, L3-4 and L4-5, causing mild encroachment on the thecal sac but without critical spinal stenosis. There are mild facet spurring changes from L2-3 down. The foramina both moderately  stenotic at L2-3, mildly stenotic at L3-4, moderately stenotic again at L4-5. Both SI joints are patent with mild spurring. The visualized sacrum is intact. Other: Comparison to the prior study reveals no significant interval change. IMPRESSION: 1. Minimal right and trace left layering pleural effusions, increased on the right since the last CT. 2. Cholelithiasis with mild increased gallbladder thickening and trace pericholecystic fluid. In this case the gallbladder findings could be congestive. Correlate clinically for acute cholecystitis. 3. Cirrhotic liver with splenomegaly and recanalization of the umbilical vein consistent with portal hypertension. Slightly prominent hepatic portal vein. 4. Trace pelvic and retroperitoneal ascites. 5. Interval increased haziness and mildly enlarged lymph nodes in the mesenteric root fat. This could relate to mesenteric adenitis, mesenteric panniculitis, or portal or mesenteric congestion as the most likely etiologies, less likely would be lymphoproliferative disease or adenopathy due to inflammatory bowel disease. 6. Aortic and branch vessel atherosclerosis. Left common femoral artery is heavily calcified with an almost certain flow-limiting stenosis. Are the patient's lower extremity symptoms greater on the left? 7. Osteopenia and degenerative change of the lumbar spine without evidence of acute fracture or progressive compression deformity. 8. Chronic mild upper plate anterior wedge compression fractures of T12 and L1. Aortic Atherosclerosis (ICD10-I70.0). Electronically Signed   By: Francis Quam M.D.   On: 04/15/2024 01:46   DG Chest 2 View Result Date: 04/14/2024 CLINICAL DATA:  Weakness, multiple falls, cough EXAM: CHEST - 2 VIEW COMPARISON:  05/24/2023 FINDINGS: Heart and mediastinal contours within limits. Linear bibasilar opacities, likely atelectasis. No effusions or pneumothorax. No acute bony abnormality. IMPRESSION: Bibasilar atelectasis. Electronically Signed    By: Franky Crease M.D.   On: 04/14/2024 23:05   CT Head Wo Contrast Result Date: 04/14/2024 CLINICAL DATA:  Blunt facial and head trauma, neck pain. EXAM: CT HEAD WITHOUT CONTRAST CT CERVICAL SPINE WITHOUT CONTRAST TECHNIQUE: Multidetector CT imaging of the head and cervical spine was performed following the standard protocol without intravenous contrast. Multiplanar CT image reconstructions of the cervical spine were also generated. RADIATION DOSE REDUCTION: This exam was performed according to the departmental dose-optimization program which includes automated exposure control, adjustment of the mA and/or kV according to patient size and/or use of iterative reconstruction technique. COMPARISON:  Head CT and cervical spine CT both 05/24/2023.  FINDINGS: CT HEAD FINDINGS Brain: There is again noted moderate for age cerebral and mild cerebellar volume loss and moderately advanced for age small vessel disease. No cortical based acute infarct, hemorrhage, mass or mass effect is seen. There is mild atrophic ventriculomegaly. There is no midline shift. The basal cisterns are patent. No old territorial infarct is seen. Vascular: There are calcifications in the carotid siphons but no hyperdense central vessel is seen. Skull: Negative for fractures or focal lesions. No appreciable scalp hematoma. Sinuses/Orbits: Negative orbits apart from interval lens extractions. There is patchy opacification of the left ethmoid air cells also new in the interval. Other paranasal sinuses, bilateral mastoid air cells, and middle ears are clear. Other: None. CT CERVICAL SPINE FINDINGS Alignment: Unchanged. Narrowing and spurring again noted of the anterior atlantodental joint and trace discogenic retrolisthesis at C2-3. There is a straightened lordosis without traumatic or further listhesis. Skull base and vertebrae: No acute fracture is evident. No primary bone lesion or focal pathologic process. Soft tissues and spinal canal: No  prevertebral fluid or swelling. No visible canal hematoma. There calcifications of the right greater than left proximal cervical ICAs. No laryngeal or thyroid  mass. Disc levels: The discs are normal in height at C2-3 and C7-T1, variably degenerated at the intervening levels with the greatest disc space loss again at C3-4. There is bidirectional endplate spurring most levels. There is mild encroachment on the ventral thecal sac at C3-4 due to broad disc osteophyte complex, C4-5 central disc protrusion impressing into the ventral cord surface in the midline, seen previously, with mild nonstenosing disc osteophyte complexes at C5-6 and C6-7. There is uncinate and facet joint spurring, with acquired foraminal stenosis which is bilaterally severe C3-4, mild on the left at C4-5. The other foramina are clear. Upper chest: Negative. Other: None. IMPRESSION: 1. No acute intracranial CT findings or depressed skull fractures. 2. Volume loss and small vessel disease. 3. Straightened lordosis without evidence of cervical fractures or traumatic listhesis. 4. Multilevel degenerative change, with C4-5 central disc protrusion impressing into the ventral cord surface in the midline, and bilaterally severe C3-4 foraminal stenosis. 5. Carotid atherosclerosis. Electronically Signed   By: Francis Quam M.D.   On: 04/14/2024 23:02   CT Cervical Spine Wo Contrast Result Date: 04/14/2024 CLINICAL DATA:  Blunt facial and head trauma, neck pain. EXAM: CT HEAD WITHOUT CONTRAST CT CERVICAL SPINE WITHOUT CONTRAST TECHNIQUE: Multidetector CT imaging of the head and cervical spine was performed following the standard protocol without intravenous contrast. Multiplanar CT image reconstructions of the cervical spine were also generated. RADIATION DOSE REDUCTION: This exam was performed according to the departmental dose-optimization program which includes automated exposure control, adjustment of the mA and/or kV according to patient size and/or  use of iterative reconstruction technique. COMPARISON:  Head CT and cervical spine CT both 05/24/2023. FINDINGS: CT HEAD FINDINGS Brain: There is again noted moderate for age cerebral and mild cerebellar volume loss and moderately advanced for age small vessel disease. No cortical based acute infarct, hemorrhage, mass or mass effect is seen. There is mild atrophic ventriculomegaly. There is no midline shift. The basal cisterns are patent. No old territorial infarct is seen. Vascular: There are calcifications in the carotid siphons but no hyperdense central vessel is seen. Skull: Negative for fractures or focal lesions. No appreciable scalp hematoma. Sinuses/Orbits: Negative orbits apart from interval lens extractions. There is patchy opacification of the left ethmoid air cells also new in the interval. Other paranasal sinuses, bilateral mastoid air cells,  and middle ears are clear. Other: None. CT CERVICAL SPINE FINDINGS Alignment: Unchanged. Narrowing and spurring again noted of the anterior atlantodental joint and trace discogenic retrolisthesis at C2-3. There is a straightened lordosis without traumatic or further listhesis. Skull base and vertebrae: No acute fracture is evident. No primary bone lesion or focal pathologic process. Soft tissues and spinal canal: No prevertebral fluid or swelling. No visible canal hematoma. There calcifications of the right greater than left proximal cervical ICAs. No laryngeal or thyroid  mass. Disc levels: The discs are normal in height at C2-3 and C7-T1, variably degenerated at the intervening levels with the greatest disc space loss again at C3-4. There is bidirectional endplate spurring most levels. There is mild encroachment on the ventral thecal sac at C3-4 due to broad disc osteophyte complex, C4-5 central disc protrusion impressing into the ventral cord surface in the midline, seen previously, with mild nonstenosing disc osteophyte complexes at C5-6 and C6-7. There is  uncinate and facet joint spurring, with acquired foraminal stenosis which is bilaterally severe C3-4, mild on the left at C4-5. The other foramina are clear. Upper chest: Negative. Other: None. IMPRESSION: 1. No acute intracranial CT findings or depressed skull fractures. 2. Volume loss and small vessel disease. 3. Straightened lordosis without evidence of cervical fractures or traumatic listhesis. 4. Multilevel degenerative change, with C4-5 central disc protrusion impressing into the ventral cord surface in the midline, and bilaterally severe C3-4 foraminal stenosis. 5. Carotid atherosclerosis. Electronically Signed   By: Francis Quam M.D.   On: 04/14/2024 23:02      Subjective: Patient denies any complaints currently, asking to go home  Discharge Exam: Vitals:   04/16/24 0353 04/16/24 1205  BP: (!) 120/56 (!) 121/59  Pulse: 66 75  Resp:  16  Temp: 97.7 F (36.5 C) 98 F (36.7 C)  SpO2: 99%    Vitals:   04/15/24 2342 04/16/24 0000 04/16/24 0353 04/16/24 1205  BP:  (!) 125/59 (!) 120/56 (!) 121/59  Pulse: 62 64 66 75  Resp:  10  16  Temp:   97.7 F (36.5 C) 98 F (36.7 C)  TempSrc:   Oral Oral  SpO2:  96% 99%   Weight:      Height:        General: Pt is alert, awake, not in acute distress Cardiovascular: RRR, S1/S2 +, no rubs, no gallops Respiratory: CTA bilaterally, no wheezing, no rhonchi Abdominal: Soft, NT, ND, bowel sounds + Extremities: no edema, no cyanosis    The results of significant diagnostics from this hospitalization (including imaging, microbiology, ancillary and laboratory) are listed below for reference.     Microbiology: No results found for this or any previous visit (from the past 240 hours).   Labs: BNP (last 3 results) No results for input(s): BNP in the last 8760 hours. Basic Metabolic Panel: Recent Labs  Lab 04/14/24 2224 04/15/24 0542 04/15/24 1000 04/15/24 1630 04/15/24 1811 04/15/24 2220 04/16/24 0850  NA 121*   < > 126* 129*  130* 132* 128*  K 3.4*   < > 3.6 4.2 4.0 3.4* 3.9  CL 89*   < > 92* 98 98 98 95*  CO2 18*   < > 24 24 23 25  20*  GLUCOSE 107*   < > 112* 120* 115* 89 124*  BUN <5*   < > 5* 13 13 9  <5*  CREATININE 0.74   < > 0.50* 0.54* 0.47* 0.49* 0.49*  CALCIUM 8.7*   < > 8.6* 8.9  9.0 9.2 8.9  MG 1.7  --   --   --   --   --  1.9  PHOS  --   --   --   --   --   --  3.0   < > = values in this interval not displayed.   Liver Function Tests: Recent Labs  Lab 04/14/24 2224 04/16/24 0850  AST 48* 52*  ALT 24 24  ALKPHOS 121 129*  BILITOT 2.0* 2.4*  PROT 7.2 6.8  ALBUMIN  3.9 3.6   No results for input(s): LIPASE, AMYLASE in the last 168 hours. Recent Labs  Lab 04/16/24 0826  AMMONIA 69*   CBC: Recent Labs  Lab 04/14/24 2224  WBC 6.6  NEUTROABS 3.9  HGB 11.3*  HCT 32.7*  MCV 106.2*  PLT 145*   Cardiac Enzymes: No results for input(s): CKTOTAL, CKMB, CKMBINDEX, TROPONINI in the last 168 hours. BNP: Invalid input(s): POCBNP CBG: No results for input(s): GLUCAP in the last 168 hours. D-Dimer No results for input(s): DDIMER in the last 72 hours. Hgb A1c No results for input(s): HGBA1C in the last 72 hours. Lipid Profile No results for input(s): CHOL, HDL, LDLCALC, TRIG, CHOLHDL, LDLDIRECT in the last 72 hours. Thyroid  function studies Recent Labs    04/15/24 0542  TSH 2.135   Anemia work up Recent Labs    04/16/24 0850  VITAMINB12 327   Urinalysis    Component Value Date/Time   COLORURINE YELLOW 04/14/2024 2224   APPEARANCEUR CLEAR 04/14/2024 2224   LABSPEC 1.013 04/14/2024 2224   PHURINE 5.0 04/14/2024 2224   GLUCOSEU NEGATIVE 04/14/2024 2224   HGBUR NEGATIVE 04/14/2024 2224   BILIRUBINUR NEGATIVE 04/14/2024 2224   KETONESUR NEGATIVE 04/14/2024 2224   PROTEINUR NEGATIVE 04/14/2024 2224   UROBILINOGEN 0.2 08/03/2011 1937   NITRITE NEGATIVE 04/14/2024 2224   LEUKOCYTESUR NEGATIVE 04/14/2024 2224   Sepsis Labs Recent Labs  Lab  04/14/24 2224  WBC 6.6   Microbiology No results found for this or any previous visit (from the past 240 hours).   Time coordinating discharge: Over 30 minutes  SIGNED:   Brayton Lye, MD  [facility] 04/16/2024, 12:40 PM Pager   If 7PM-7AM, please contact night-coverage [email or web address] Password [email or web address]

## 2024-04-16 NOTE — Plan of Care (Signed)

## 2024-04-16 NOTE — Evaluation (Signed)
 Occupational Therapy Evaluation Patient Details Name: Jeffrey Randolph MRN: [id number] DOB: 11-25-1955 Today's Date: 04/16/2024   History of Present Illness   68 y.o. male presents to [facility] 04/14/24 with frequent falls, increased pain in LE and weakness. CT abdomen/pelvis showed fluid overload w/ pleural effusion, thickening of gallbladder, and flow limitation in L femoral artery. C-spine CT showed C4-C5 central disc protrusion on ventral cord. PMH includes afib, HTN, CVA, hepatic cirrhosis, neuropathy, HFpEF.     Clinical Impressions Pt is typically independent. Sponge bathe at baseline. Reports spending most of his time outside. Pt is currently functioning at a set up to supervision level for safety with RW as he has a history of falls with knees buckling. No buckling noted this visit. He reports walking to the bathroom on his own with RW without difficulty. Will follow acutely. Recommending HHOT, pt likely not to accept.      If plan is discharge home, recommend the following:   Assist for transportation     Functional Status Assessment   Patient has had a recent decline in their functional status and/or demonstrates limited ability to make significant improvements in function in a reasonable and predictable amount of time     Equipment Recommendations   None recommended by OT     Recommendations for Other Services         Precautions/Restrictions   Precautions Precautions: Fall Recall of Precautions/Restrictions: Intact Precaution/Restrictions Comments: pt has been using RW Restrictions Weight Bearing Restrictions Per Provider Order: No     Mobility Bed Mobility Overal bed mobility: Modified Independent                  Transfers Overall transfer level: Needs assistance Equipment used: Rolling walker (2 wheels) Transfers: Sit to/from Stand Sit to Stand: Supervision           General transfer comment: cues for hand placement      Balance Overall  balance assessment: Mild deficits observed, not formally tested, Needs assistance, History of Falls Sitting-balance support: No upper extremity supported, Feet supported Sitting balance-Leahy Scale: Normal     Standing balance support: No upper extremity supported Standing balance-Leahy Scale: Fair Standing balance comment: RW for gait due to hx of knees buckling                           ADL either performed or assessed with clinical judgement   ADL Overall ADL's : Needs assistance/impaired Eating/Feeding: Independent;Sitting   Grooming: Supervision/safety;Standing   Upper Body Bathing: Set up;Sitting   Lower Body Bathing: Supervison/ safety;Sit to/from stand   Upper Body Dressing : Set up;Sitting   Lower Body Dressing: Supervision/safety;Sit to/from stand   Toilet Transfer: Supervision/safety;Ambulation;Rolling walker (2 wheels)   Toileting- Clothing Manipulation and Hygiene: Supervision/safety;Sit to/from stand       Functional mobility during ADLs: Supervision/safety;Rolling walker (2 wheels) General ADL Comments: Educated in fall prevention, importance of use of RW.     Vision Ability to See in Adequate Light: 0 Adequate Patient Visual Report: No change from baseline       Perception         Praxis         Pertinent Vitals/Pain Pain Assessment Pain Assessment: No/denies pain     Extremity/Trunk Assessment Upper Extremity Assessment Upper Extremity Assessment: Overall WFL for tasks assessed   Lower Extremity Assessment Lower Extremity Assessment: Defer to PT evaluation   Cervical / Trunk Assessment Cervical / Trunk Assessment: Other  exceptions (cervical compresion per neurosurgery note)   Communication Communication Communication: No apparent difficulties   Cognition Arousal: Alert Behavior During Therapy: WFL for tasks assessed/performed Cognition: No apparent impairments                               Following  commands: Intact       Cueing  General Comments   Cueing Techniques: Verbal cues      Exercises     Shoulder Instructions      Home Living Family/patient expects to be discharged to:: Private residence Living Arrangements: Alone Available Help at Discharge: Friend(s);Available PRN/intermittently Type of Home: House Home Access: Level entry     Home Layout: One level     Bathroom Shower/Tub: Sponge bathes at baseline;Tub/shower unit   Bathroom Toilet:  (outhouse)     Home Equipment: Agricultural consultant (2 wheels);Wheelchair - manual          Prior Functioning/Environment Prior Level of Function : Independent/Modified Independent;History of Falls (last six months)             Mobility Comments: ModI with no AD, 5 falls in the past week. Legs give out on him ADLs Comments: drives scooter for community mobility or gets rides with friends; independent ADLs, IADLs    OT Problem List: Impaired balance (sitting and/or standing);Decreased knowledge of use of DME or AE   OT Treatment/Interventions:        OT Goals(Current goals can be found in the care plan section)   Acute Rehab OT Goals OT Goal Formulation: With patient Time For Goal Achievement: 04/30/24 Potential to Achieve Goals: Good ADL Goals Pt Will Perform Grooming: with modified independence;standing;sitting Pt Will Perform Lower Body Bathing: with modified independence;sit to/from stand;sitting/lateral leans Pt Will Perform Lower Body Dressing: with modified independence;sitting/lateral leans;sit to/from stand Pt Will Transfer to Toilet: with modified independence;ambulating;regular height toilet Pt Will Perform Toileting - Clothing Manipulation and hygiene: with modified independence;sit to/from stand Additional ADL Goal #1: Pt will state at least 3 fall prevention strategies.   OT Frequency:       Co-evaluation              AM-PAC OT 6 Clicks Daily Activity     Outcome Measure Help from  another person eating meals?: None Help from another person taking care of personal grooming?: A Little Help from another person toileting, which includes using toliet, bedpan, or urinal?: A Little Help from another person bathing (including washing, rinsing, drying)?: A Little Help from another person to put on and taking off regular upper body clothing?: None Help from another person to put on and taking off regular lower body clothing?: A Little 6 Click Score: 20   End of Session Equipment Utilized During Treatment: Rolling walker (2 wheels);Gait belt  Activity Tolerance: Patient tolerated treatment well Patient left: in bed;with call bell/phone within reach  OT Visit Diagnosis: Unsteadiness on feet (R26.81);History of falling (Z91.81)                Time: 1005-1025 OT Time Calculation (min): 20 min Charges:  OT General Charges $OT Visit: 1 Visit OT Evaluation $OT Eval Low Complexity: 1 Low Mliss HERO, OTR/L Acute Rehabilitation Services Office: [phone]   Kennth Mliss Helling 04/16/2024, 10:41 AM

## 2024-04-16 NOTE — TOC Transition Note (Signed)
 Transition of Care [facility]) - Discharge Note   Patient Details  Name: Jeffrey Randolph MRN: [id number] Date of Birth: 11-13-1955  Transition [facility] Care [facility]) CM/SW Contact:  Corean JAYSON Canary, RN Phone Number: 04/16/2024, 11:18 AM   Clinical Narrative:    Met with patient at bedside, spoke with him regarding Home health options. He stated he is not interested in Home health at present, he has friends to help him. Inquired about DME, he states his friend has had nine surgeries and has a cane, walker etc. Labs are pending, he will likely DC today, he is looking forward to discharge and has a friend to pick him up.   No other needs identified   Final next level of care: Home/Self Care Barriers to Discharge: No Barriers Identified   Patient Goals and CMS Choice Patient states their goals for this hospitalization and ongoing recovery are:: Go home          Discharge Placement                       Discharge Plan and Services Additional resources added to the After Visit Summary for                                       Social Drivers of Health (SDOH) Interventions SDOH Screenings   Food Insecurity: No Food Insecurity (04/16/2024)  Housing: Low Risk  (04/16/2024)  Transportation Needs: No Transportation Needs (04/16/2024)  Utilities: Not At Risk (04/16/2024)  Alcohol Screen: Low Risk  (05/30/2023)  Social Connections: Patient Declined (04/16/2024)  Tobacco Use: Medium Risk (04/15/2024)     Readmission Risk Interventions    05/09/2023    2:37 PM  Readmission Risk Prevention Plan  Post Dischage Appt Complete  Medication Screening Complete  Transportation Screening Complete

## 2024-04-28 DIAGNOSIS — M4802 Spinal stenosis, cervical region: Secondary | ICD-10-CM | POA: Diagnosis not present

## 2024-04-28 DIAGNOSIS — K7460 Unspecified cirrhosis of liver: Secondary | ICD-10-CM | POA: Diagnosis not present

## 2024-04-28 DIAGNOSIS — Z79899 Other long term (current) drug therapy: Secondary | ICD-10-CM | POA: Diagnosis not present

## 2024-04-28 DIAGNOSIS — R188 Other ascites: Secondary | ICD-10-CM | POA: Diagnosis not present

## 2024-04-28 DIAGNOSIS — M5416 Radiculopathy, lumbar region: Secondary | ICD-10-CM | POA: Diagnosis not present

## 2024-04-28 DIAGNOSIS — R590 Localized enlarged lymph nodes: Secondary | ICD-10-CM | POA: Diagnosis not present

## 2024-05-17 ENCOUNTER — Telehealth: Payer: Self-pay

## 2024-05-17 NOTE — Telephone Encounter (Signed)
   Name: Jeffrey Randolph  DOB: Jan 28, 1956  MRN: [id number]  Primary Cardiologist: Wilbert Bihari, MD  Chart reviewed as part of pre-operative protocol coverage. Because of [name]'s past medical history and time since last visit, he will require a follow-up in-office visit in order to better assess preoperative cardiovascular risk.  Pre-op covering staff: - Please schedule appointment and call patient to inform them. If patient already had an upcoming appointment within acceptable timeframe, please add pre-op clearance to the appointment notes so provider is aware. - Please contact requesting surgeon's office via preferred method (i.e, phone, fax) to inform them of need for appointment prior to surgery.  Clearance request notes holding of Eliquis  however this medication is listed is not taking and has not been refilled in recent years.  Will need to be discussed at office visit.  Alani Lacivita D Kenisha Lynds, NP  05/17/2024, 4:57 PM

## 2024-05-17 NOTE — Telephone Encounter (Signed)
   Pre-operative Risk Assessment    Patient Name: Maverick Dieudonne  DOB: [birth year] MRN: [id number]   Date of last office visit: 06/15/2019, Dr. Wilbert Bihari, MD Date of next office visit: 06/10/2024, Dr. Wilbert Bihari, MD   Request for Surgical Clearance    Procedure:  ACDF C3-C4, C4-C5, C5-C6  Date of Surgery:  Clearance TBD                                Surgeon: Dr. Colon Socks Group or Practice Name: [facility]  Phone number: [phone] Fax number: [phone]   Type of Clearance Requested:   - Medical  - Pharmacy:  Hold Apixaban  (Eliquis )     Type of Anesthesia:  General    Additional requests/questions:    Bonney Asberry KANDICE Ethelene   05/17/2024, 12:32 PM

## 2024-05-21 ENCOUNTER — Emergency Department (HOSPITAL_COMMUNITY)

## 2024-05-21 ENCOUNTER — Other Ambulatory Visit: Payer: Self-pay

## 2024-05-21 ENCOUNTER — Inpatient Hospital Stay (HOSPITAL_COMMUNITY)

## 2024-05-21 ENCOUNTER — Encounter (HOSPITAL_COMMUNITY): Payer: Self-pay

## 2024-05-21 ENCOUNTER — Inpatient Hospital Stay (HOSPITAL_COMMUNITY)
Admission: EM | Admit: 2024-05-21 | Discharge: 2024-05-25 | DRG: 522 | Disposition: A | Discharge status: Skilled Nursing Facility | Attending: Family Medicine | Admitting: Family Medicine

## 2024-05-21 ENCOUNTER — Encounter (HOSPITAL_COMMUNITY)
Admission: EM | Disposition: A | Discharge status: Skilled Nursing Facility | Payer: Self-pay | Source: Home / Self Care | Attending: Family Medicine

## 2024-05-21 DIAGNOSIS — S72011A Unspecified intracapsular fracture of right femur, initial encounter for closed fracture: Secondary | ICD-10-CM | POA: Diagnosis present

## 2024-05-21 DIAGNOSIS — Z8673 Personal history of transient ischemic attack (TIA), and cerebral infarction without residual deficits: Secondary | ICD-10-CM

## 2024-05-21 DIAGNOSIS — S72011D Unspecified intracapsular fracture of right femur, subsequent encounter for closed fracture with routine healing: Secondary | ICD-10-CM | POA: Diagnosis not present

## 2024-05-21 DIAGNOSIS — I959 Hypotension, unspecified: Secondary | ICD-10-CM | POA: Diagnosis present

## 2024-05-21 DIAGNOSIS — S72031A Displaced midcervical fracture of right femur, initial encounter for closed fracture: Secondary | ICD-10-CM | POA: Diagnosis not present

## 2024-05-21 DIAGNOSIS — Z79899 Other long term (current) drug therapy: Secondary | ICD-10-CM

## 2024-05-21 DIAGNOSIS — Y9301 Activity, walking, marching and hiking: Secondary | ICD-10-CM | POA: Diagnosis present

## 2024-05-21 DIAGNOSIS — S0990XA Unspecified injury of head, initial encounter: Secondary | ICD-10-CM | POA: Diagnosis not present

## 2024-05-21 DIAGNOSIS — K7030 Alcoholic cirrhosis of liver without ascites: Secondary | ICD-10-CM | POA: Diagnosis present

## 2024-05-21 DIAGNOSIS — Z96641 Presence of right artificial hip joint: Secondary | ICD-10-CM | POA: Diagnosis not present

## 2024-05-21 DIAGNOSIS — D62 Acute posthemorrhagic anemia: Secondary | ICD-10-CM | POA: Diagnosis not present

## 2024-05-21 DIAGNOSIS — M47816 Spondylosis without myelopathy or radiculopathy, lumbar region: Secondary | ICD-10-CM | POA: Diagnosis not present

## 2024-05-21 DIAGNOSIS — S72001A Fracture of unspecified part of neck of right femur, initial encounter for closed fracture: Secondary | ICD-10-CM

## 2024-05-21 DIAGNOSIS — Z471 Aftercare following joint replacement surgery: Secondary | ICD-10-CM | POA: Diagnosis not present

## 2024-05-21 DIAGNOSIS — K219 Gastro-esophageal reflux disease without esophagitis: Secondary | ICD-10-CM | POA: Diagnosis present

## 2024-05-21 DIAGNOSIS — Z743 Need for continuous supervision: Secondary | ICD-10-CM | POA: Diagnosis not present

## 2024-05-21 DIAGNOSIS — F1020 Alcohol dependence, uncomplicated: Secondary | ICD-10-CM | POA: Diagnosis present

## 2024-05-21 DIAGNOSIS — W01198A Fall on same level from slipping, tripping and stumbling with subsequent striking against other object, initial encounter: Secondary | ICD-10-CM | POA: Diagnosis present

## 2024-05-21 DIAGNOSIS — G319 Degenerative disease of nervous system, unspecified: Secondary | ICD-10-CM | POA: Diagnosis not present

## 2024-05-21 DIAGNOSIS — M1990 Unspecified osteoarthritis, unspecified site: Secondary | ICD-10-CM | POA: Diagnosis not present

## 2024-05-21 DIAGNOSIS — M6281 Muscle weakness (generalized): Secondary | ICD-10-CM | POA: Diagnosis not present

## 2024-05-21 DIAGNOSIS — C44311 Basal cell carcinoma of skin of nose: Secondary | ICD-10-CM | POA: Diagnosis present

## 2024-05-21 DIAGNOSIS — Z87891 Personal history of nicotine dependence: Secondary | ICD-10-CM | POA: Diagnosis not present

## 2024-05-21 DIAGNOSIS — E872 Acidosis, unspecified: Secondary | ICD-10-CM | POA: Diagnosis present

## 2024-05-21 DIAGNOSIS — I10 Essential (primary) hypertension: Secondary | ICD-10-CM | POA: Diagnosis not present

## 2024-05-21 DIAGNOSIS — K7460 Unspecified cirrhosis of liver: Secondary | ICD-10-CM | POA: Diagnosis not present

## 2024-05-21 DIAGNOSIS — M79606 Pain in leg, unspecified: Secondary | ICD-10-CM | POA: Diagnosis not present

## 2024-05-21 DIAGNOSIS — M48061 Spinal stenosis, lumbar region without neurogenic claudication: Secondary | ICD-10-CM | POA: Diagnosis not present

## 2024-05-21 DIAGNOSIS — W19XXXA Unspecified fall, initial encounter: Principal | ICD-10-CM

## 2024-05-21 DIAGNOSIS — E871 Hypo-osmolality and hyponatremia: Secondary | ICD-10-CM | POA: Diagnosis not present

## 2024-05-21 DIAGNOSIS — I110 Hypertensive heart disease with heart failure: Secondary | ICD-10-CM | POA: Diagnosis present

## 2024-05-21 DIAGNOSIS — E222 Syndrome of inappropriate secretion of antidiuretic hormone: Secondary | ICD-10-CM | POA: Diagnosis present

## 2024-05-21 DIAGNOSIS — I679 Cerebrovascular disease, unspecified: Secondary | ICD-10-CM

## 2024-05-21 DIAGNOSIS — R531 Weakness: Secondary | ICD-10-CM | POA: Diagnosis not present

## 2024-05-21 DIAGNOSIS — K8020 Calculus of gallbladder without cholecystitis without obstruction: Secondary | ICD-10-CM | POA: Diagnosis not present

## 2024-05-21 DIAGNOSIS — Z7901 Long term (current) use of anticoagulants: Secondary | ICD-10-CM

## 2024-05-21 DIAGNOSIS — I5032 Chronic diastolic (congestive) heart failure: Secondary | ICD-10-CM | POA: Diagnosis present

## 2024-05-21 DIAGNOSIS — Y92009 Unspecified place in unspecified non-institutional (private) residence as the place of occurrence of the external cause: Secondary | ICD-10-CM | POA: Diagnosis not present

## 2024-05-21 DIAGNOSIS — C4490 Unspecified malignant neoplasm of skin, unspecified: Secondary | ICD-10-CM | POA: Diagnosis not present

## 2024-05-21 DIAGNOSIS — E440 Moderate protein-calorie malnutrition: Secondary | ICD-10-CM | POA: Insufficient documentation

## 2024-05-21 DIAGNOSIS — I4891 Unspecified atrial fibrillation: Secondary | ICD-10-CM

## 2024-05-21 DIAGNOSIS — M25551 Pain in right hip: Secondary | ICD-10-CM | POA: Diagnosis present

## 2024-05-21 DIAGNOSIS — M51369 Other intervertebral disc degeneration, lumbar region without mention of lumbar back pain or lower extremity pain: Secondary | ICD-10-CM | POA: Diagnosis not present

## 2024-05-21 DIAGNOSIS — N400 Enlarged prostate without lower urinary tract symptoms: Secondary | ICD-10-CM | POA: Diagnosis present

## 2024-05-21 DIAGNOSIS — J90 Pleural effusion, not elsewhere classified: Secondary | ICD-10-CM | POA: Diagnosis not present

## 2024-05-21 DIAGNOSIS — I700 Atherosclerosis of aorta: Secondary | ICD-10-CM | POA: Diagnosis not present

## 2024-05-21 DIAGNOSIS — S299XXA Unspecified injury of thorax, initial encounter: Secondary | ICD-10-CM | POA: Diagnosis not present

## 2024-05-21 DIAGNOSIS — F1011 Alcohol abuse, in remission: Secondary | ICD-10-CM | POA: Diagnosis present

## 2024-05-21 DIAGNOSIS — I5033 Acute on chronic diastolic (congestive) heart failure: Secondary | ICD-10-CM | POA: Diagnosis present

## 2024-05-21 DIAGNOSIS — D539 Nutritional anemia, unspecified: Secondary | ICD-10-CM | POA: Diagnosis present

## 2024-05-21 DIAGNOSIS — G2581 Restless legs syndrome: Secondary | ICD-10-CM | POA: Diagnosis present

## 2024-05-21 DIAGNOSIS — I4821 Permanent atrial fibrillation: Secondary | ICD-10-CM | POA: Diagnosis present

## 2024-05-21 DIAGNOSIS — Z66 Do not resuscitate: Secondary | ICD-10-CM | POA: Diagnosis present

## 2024-05-21 DIAGNOSIS — E876 Hypokalemia: Secondary | ICD-10-CM | POA: Diagnosis present

## 2024-05-21 DIAGNOSIS — R7302 Impaired glucose tolerance (oral): Secondary | ICD-10-CM | POA: Diagnosis present

## 2024-05-21 DIAGNOSIS — I482 Chronic atrial fibrillation, unspecified: Secondary | ICD-10-CM | POA: Diagnosis present

## 2024-05-21 DIAGNOSIS — J9811 Atelectasis: Secondary | ICD-10-CM | POA: Diagnosis present

## 2024-05-21 DIAGNOSIS — I5030 Unspecified diastolic (congestive) heart failure: Secondary | ICD-10-CM | POA: Diagnosis present

## 2024-05-21 DIAGNOSIS — R918 Other nonspecific abnormal finding of lung field: Secondary | ICD-10-CM | POA: Diagnosis not present

## 2024-05-21 DIAGNOSIS — R296 Repeated falls: Secondary | ICD-10-CM | POA: Diagnosis present

## 2024-05-21 DIAGNOSIS — R2689 Other abnormalities of gait and mobility: Secondary | ICD-10-CM | POA: Diagnosis not present

## 2024-05-21 DIAGNOSIS — Z741 Need for assistance with personal care: Secondary | ICD-10-CM | POA: Diagnosis not present

## 2024-05-21 DIAGNOSIS — Y901 Blood alcohol level of 20-39 mg/100 ml: Secondary | ICD-10-CM | POA: Diagnosis present

## 2024-05-21 DIAGNOSIS — M5134 Other intervertebral disc degeneration, thoracic region: Secondary | ICD-10-CM | POA: Diagnosis not present

## 2024-05-21 DIAGNOSIS — M1612 Unilateral primary osteoarthritis, left hip: Secondary | ICD-10-CM | POA: Diagnosis not present

## 2024-05-21 DIAGNOSIS — M6259 Muscle wasting and atrophy, not elsewhere classified, multiple sites: Secondary | ICD-10-CM | POA: Diagnosis not present

## 2024-05-21 DIAGNOSIS — I6523 Occlusion and stenosis of bilateral carotid arteries: Secondary | ICD-10-CM | POA: Diagnosis not present

## 2024-05-21 DIAGNOSIS — M47814 Spondylosis without myelopathy or radiculopathy, thoracic region: Secondary | ICD-10-CM | POA: Diagnosis not present

## 2024-05-21 DIAGNOSIS — M25572 Pain in left ankle and joints of left foot: Secondary | ICD-10-CM | POA: Diagnosis not present

## 2024-05-21 HISTORY — PX: TOTAL HIP ARTHROPLASTY: SHX124

## 2024-05-21 LAB — URINALYSIS, ROUTINE W REFLEX MICROSCOPIC
Bilirubin Urine: NEGATIVE
Glucose, UA: NEGATIVE mg/dL
Hgb urine dipstick: NEGATIVE
Ketones, ur: NEGATIVE mg/dL
Leukocytes,Ua: NEGATIVE
Nitrite: NEGATIVE
Protein, ur: NEGATIVE mg/dL
Specific Gravity, Urine: 1.005 (ref 1.005–1.030)
pH: 5 (ref 5.0–8.0)

## 2024-05-21 LAB — PROTIME-INR
INR: 1.3 — ABNORMAL HIGH (ref 0.8–1.2)
Prothrombin Time: 17.2 s — ABNORMAL HIGH (ref 11.4–15.2)

## 2024-05-21 LAB — SURGICAL PCR SCREEN
MRSA, PCR: NEGATIVE
Staphylococcus aureus: POSITIVE — AB

## 2024-05-21 LAB — CBC
HCT: 32 % — ABNORMAL LOW (ref 39.0–52.0)
Hemoglobin: 11.3 g/dL — ABNORMAL LOW (ref 13.0–17.0)
MCH: 36.5 pg — ABNORMAL HIGH (ref 26.0–34.0)
MCHC: 35.3 g/dL (ref 30.0–36.0)
MCV: 103.2 fL — ABNORMAL HIGH (ref 80.0–100.0)
Platelets: 179 K/uL (ref 150–400)
RBC: 3.1 MIL/uL — ABNORMAL LOW (ref 4.22–5.81)
RDW: 18.9 % — ABNORMAL HIGH (ref 11.5–15.5)
WBC: 5.6 K/uL (ref 4.0–10.5)
nRBC: 0.7 % — ABNORMAL HIGH (ref 0.0–0.2)

## 2024-05-21 LAB — COMPREHENSIVE METABOLIC PANEL WITH GFR
ALT: 23 U/L (ref 0–44)
AST: 45 U/L — ABNORMAL HIGH (ref 15–41)
Albumin: 3.3 g/dL — ABNORMAL LOW (ref 3.5–5.0)
Alkaline Phosphatase: 127 U/L — ABNORMAL HIGH (ref 38–126)
Anion gap: 12 (ref 5–15)
BUN: <5 mg/dL — ABNORMAL LOW (ref 8–23)
CO2: 17 mmol/L — ABNORMAL LOW (ref 22–32)
Calcium: 8.2 mg/dL — ABNORMAL LOW (ref 8.9–10.3)
Chloride: 92 mmol/L — ABNORMAL LOW (ref 98–111)
Creatinine, Ser: 0.51 mg/dL — ABNORMAL LOW (ref 0.61–1.24)
GFR, Estimated: >60 mL/min (ref >60)
Glucose, Bld: 126 mg/dL — ABNORMAL HIGH (ref 70–99)
Potassium: 3.4 mmol/L — ABNORMAL LOW (ref 3.5–5.1)
Sodium: 121 mmol/L — ABNORMAL LOW (ref 135–145)
Total Bilirubin: 1.5 mg/dL — ABNORMAL HIGH (ref 0.0–1.2)
Total Protein: 6.7 g/dL (ref 6.5–8.1)

## 2024-05-21 LAB — BASIC METABOLIC PANEL WITH GFR
Anion gap: 8 (ref 5–15)
BUN: <5 mg/dL — ABNORMAL LOW (ref 8–23)
CO2: 20 mmol/L — ABNORMAL LOW (ref 22–32)
Calcium: 7.9 mg/dL — ABNORMAL LOW (ref 8.9–10.3)
Chloride: 93 mmol/L — ABNORMAL LOW (ref 98–111)
Creatinine, Ser: 0.4 mg/dL — ABNORMAL LOW (ref 0.61–1.24)
GFR, Estimated: >60 mL/min (ref >60)
Glucose, Bld: 166 mg/dL — ABNORMAL HIGH (ref 70–99)
Potassium: 3.9 mmol/L (ref 3.5–5.1)
Sodium: 121 mmol/L — ABNORMAL LOW (ref 135–145)

## 2024-05-21 LAB — I-STAT CHEM 8, ED
BUN: <3 mg/dL — ABNORMAL LOW (ref 8–23)
Calcium, Ion: 1.03 mmol/L — ABNORMAL LOW (ref 1.15–1.40)
Chloride: 88 mmol/L — ABNORMAL LOW (ref 98–111)
Creatinine, Ser: 0.5 mg/dL — ABNORMAL LOW (ref 0.61–1.24)
Glucose, Bld: 124 mg/dL — ABNORMAL HIGH (ref 70–99)
HCT: 35 % — ABNORMAL LOW (ref 39.0–52.0)
Hemoglobin: 11.9 g/dL — ABNORMAL LOW (ref 13.0–17.0)
Potassium: 3.4 mmol/L — ABNORMAL LOW (ref 3.5–5.1)
Sodium: 122 mmol/L — ABNORMAL LOW (ref 135–145)
TCO2: 19 mmol/L — ABNORMAL LOW (ref 22–32)

## 2024-05-21 LAB — ABO/RH: ABO/RH(D): O POS

## 2024-05-21 LAB — APTT: aPTT: 36 s (ref 24–36)

## 2024-05-21 LAB — ETHANOL: Alcohol, Ethyl (B): 38 mg/dL — ABNORMAL HIGH (ref <15)

## 2024-05-21 LAB — I-STAT CG4 LACTIC ACID, ED: Lactic Acid, Venous: 4 mmol/L (ref 0.5–1.9)

## 2024-05-21 LAB — PHOSPHORUS: Phosphorus: 2.9 mg/dL (ref 2.5–4.6)

## 2024-05-21 LAB — VITAMIN D 25 HYDROXY (VIT D DEFICIENCY, FRACTURES): Vit D, 25-Hydroxy: 30.54 ng/mL (ref 30–100)

## 2024-05-21 LAB — MAGNESIUM: Magnesium: 1.4 mg/dL — ABNORMAL LOW (ref 1.7–2.4)

## 2024-05-21 SURGERY — ARTHROPLASTY, HIP, TOTAL, ANTERIOR APPROACH
Anesthesia: General | Site: Hip | Laterality: Right

## 2024-05-21 MED ORDER — LORAZEPAM 2 MG/ML IJ SOLN: 1.0000 mg | INTRAMUSCULAR | Status: DC | PRN

## 2024-05-21 MED ORDER — ACETAMINOPHEN 325 MG PO TABS
650.0000 mg | ORAL_TABLET | Freq: Four times a day (QID) | ORAL | Status: DC
  Administered 2024-05-22 – 2024-05-25 (×11): 650 mg via ORAL
  Filled 2024-05-21 (×12): qty 2

## 2024-05-21 MED ORDER — ONDANSETRON HCL 4 MG/2ML IJ SOLN
INTRAMUSCULAR | Status: AC
Start: 2024-05-21 — End: 2024-05-21
  Filled 2024-05-21: qty 2

## 2024-05-21 MED ORDER — TRANEXAMIC ACID-NACL 1000-0.7 MG/100ML-% IV SOLN
1000.0000 mg | Freq: Once | INTRAVENOUS | Status: AC
  Administered 2024-05-21: 1000 mg via INTRAVENOUS
  Filled 2024-05-21: qty 100

## 2024-05-21 MED ORDER — LACTATED RINGERS IV BOLUS
500.0000 mL | Freq: Once | INTRAVENOUS | Status: AC
  Administered 2024-05-21: 500 mL via INTRAVENOUS

## 2024-05-21 MED ORDER — VANCOMYCIN HCL 1000 MG IV SOLR
INTRAVENOUS | Status: DC | PRN
  Administered 2024-05-21: 1000 mg via TOPICAL

## 2024-05-21 MED ORDER — FENTANYL CITRATE (PF) 250 MCG/5ML IJ SOLN
INTRAMUSCULAR | Status: DC | PRN
  Administered 2024-05-21 (×2): 50 ug via INTRAVENOUS
  Administered 2024-05-21: 100 ug via INTRAVENOUS
  Administered 2024-05-21: 50 ug via INTRAVENOUS

## 2024-05-21 MED ORDER — OXYCODONE HCL 5 MG/5ML PO SOLN: 5.0000 mg | Freq: Once | ORAL | Status: DC | PRN

## 2024-05-21 MED ORDER — ONDANSETRON HCL 4 MG/2ML IJ SOLN
INTRAMUSCULAR | Status: DC | PRN
  Administered 2024-05-21: 4 mg via INTRAVENOUS

## 2024-05-21 MED ORDER — METOCLOPRAMIDE HCL 5 MG/ML IJ SOLN: 5.0000 mg | Freq: Three times a day (TID) | INTRAMUSCULAR | Status: DC | PRN

## 2024-05-21 MED ORDER — HYDROCODONE-ACETAMINOPHEN 5-325 MG PO TABS: 1.0000 | ORAL_TABLET | Freq: Four times a day (QID) | ORAL | Status: DC | PRN

## 2024-05-21 MED ORDER — ALBUMIN HUMAN 5 % IV SOLN
INTRAVENOUS | Status: AC
  Filled 2024-05-21: qty 250

## 2024-05-21 MED ORDER — DIPHENHYDRAMINE HCL 12.5 MG/5ML PO ELIX: 12.5000 mg | ORAL_SOLUTION | ORAL | Status: DC | PRN

## 2024-05-21 MED ORDER — HYDROMORPHONE HCL 1 MG/ML IJ SOLN
INTRAMUSCULAR | Status: DC | PRN
  Administered 2024-05-21: .5 mg via INTRAVENOUS

## 2024-05-21 MED ORDER — PREGABALIN 100 MG PO CAPS: 200.0000 mg | ORAL_CAPSULE | Freq: Every day | ORAL | Status: DC | PRN

## 2024-05-21 MED ORDER — THIAMINE HCL 100 MG/ML IJ SOLN: 100.0000 mg | Freq: Every day | INTRAMUSCULAR | Status: DC

## 2024-05-21 MED ORDER — SUGAMMADEX SODIUM 200 MG/2ML IV SOLN
INTRAVENOUS | Status: DC | PRN
  Administered 2024-05-21: 200 mg via INTRAVENOUS

## 2024-05-21 MED ORDER — ONDANSETRON HCL 4 MG PO TABS: 4.0000 mg | ORAL_TABLET | Freq: Four times a day (QID) | ORAL | Status: DC | PRN

## 2024-05-21 MED ORDER — FOLIC ACID 1 MG PO TABS: 1.0000 mg | ORAL_TABLET | Freq: Every day | ORAL | Status: DC

## 2024-05-21 MED ORDER — BISACODYL 5 MG PO TBEC: 5.0000 mg | DELAYED_RELEASE_TABLET | Freq: Every day | ORAL | Status: DC | PRN

## 2024-05-21 MED ORDER — POTASSIUM CHLORIDE CRYS ER 20 MEQ PO TBCR
40.0000 meq | EXTENDED_RELEASE_TABLET | ORAL | Status: AC
  Administered 2024-05-21: 40 meq via ORAL
  Filled 2024-05-21: qty 2

## 2024-05-21 MED ORDER — MORPHINE SULFATE (PF) 4 MG/ML IV SOLN
4.0000 mg | Freq: Once | INTRAVENOUS | Status: AC
  Administered 2024-05-21: 4 mg via INTRAVENOUS
  Filled 2024-05-21: qty 1

## 2024-05-21 MED ORDER — DEXAMETHASONE SODIUM PHOSPHATE 10 MG/ML IJ SOLN
INTRAMUSCULAR | Status: DC | PRN
  Administered 2024-05-21: 5 mg via INTRAVENOUS

## 2024-05-21 MED ORDER — LACTULOSE 10 GM/15ML PO SOLN
20.0000 g | Freq: Two times a day (BID) | ORAL | Status: DC
  Administered 2024-05-21 – 2024-05-25 (×8): 20 g via ORAL
  Filled 2024-05-21 (×8): qty 30

## 2024-05-21 MED ORDER — CEFAZOLIN SODIUM-DEXTROSE 2-4 GM/100ML-% IV SOLN
2.0000 g | INTRAVENOUS | Status: AC
  Administered 2024-05-21: 2 g via INTRAVENOUS

## 2024-05-21 MED ORDER — THIAMINE MONONITRATE 100 MG PO TABS
100.0000 mg | ORAL_TABLET | Freq: Every day | ORAL | Status: DC
  Administered 2024-05-22 – 2024-05-23 (×2): 100 mg via ORAL
  Filled 2024-05-21 (×2): qty 1

## 2024-05-21 MED ORDER — MORPHINE SULFATE (PF) 2 MG/ML IV SOLN: 0.5000 mg | INTRAVENOUS | Status: DC | PRN

## 2024-05-21 MED ORDER — METHOCARBAMOL 500 MG PO TABS
500.0000 mg | ORAL_TABLET | Freq: Four times a day (QID) | ORAL | Status: DC | PRN
  Administered 2024-05-24 (×2): 500 mg via ORAL
  Filled 2024-05-21 (×2): qty 1

## 2024-05-21 MED ORDER — CHLORHEXIDINE GLUCONATE 0.12 % MT SOLN: 15.0000 mL | Freq: Once | OROMUCOSAL | Status: AC

## 2024-05-21 MED ORDER — STERILE WATER FOR IRRIGATION IR SOLN
Status: DC | PRN
  Administered 2024-05-21: 1000 mL

## 2024-05-21 MED ORDER — FENTANYL CITRATE (PF) 100 MCG/2ML IJ SOLN: 25.0000 ug | INTRAMUSCULAR | Status: DC | PRN

## 2024-05-21 MED ORDER — FENTANYL CITRATE (PF) 250 MCG/5ML IJ SOLN
INTRAMUSCULAR | Status: AC
  Filled 2024-05-21: qty 5

## 2024-05-21 MED ORDER — TRANEXAMIC ACID-NACL 1000-0.7 MG/100ML-% IV SOLN
INTRAVENOUS | Status: AC
  Filled 2024-05-21: qty 100

## 2024-05-21 MED ORDER — METHOCARBAMOL 1000 MG/10ML IJ SOLN
500.0000 mg | Freq: Four times a day (QID) | INTRAMUSCULAR | Status: DC | PRN
Start: 2024-05-21 — End: 2024-05-22

## 2024-05-21 MED ORDER — VANCOMYCIN HCL 1000 MG IV SOLR
INTRAVENOUS | Status: AC
Start: 2024-05-21 — End: 2024-05-21
  Filled 2024-05-21: qty 20

## 2024-05-21 MED ORDER — METOCLOPRAMIDE HCL 5 MG PO TABS: 5.0000 mg | ORAL_TABLET | Freq: Three times a day (TID) | ORAL | Status: DC | PRN

## 2024-05-21 MED ORDER — SENNOSIDES-DOCUSATE SODIUM 8.6-50 MG PO TABS: 1.0000 | ORAL_TABLET | Freq: Every evening | ORAL | Status: DC | PRN

## 2024-05-21 MED ORDER — ACETAMINOPHEN 10 MG/ML IV SOLN
INTRAVENOUS | Status: AC
Start: 2024-05-21 — End: 2024-05-21
  Filled 2024-05-21: qty 100

## 2024-05-21 MED ORDER — LIDOCAINE 2% (20 MG/ML) 5 ML SYRINGE
INTRAMUSCULAR | Status: AC
  Filled 2024-05-21: qty 5

## 2024-05-21 MED ORDER — ALBUMIN HUMAN 5 % IV SOLN: INTRAVENOUS | Status: DC | PRN

## 2024-05-21 MED ORDER — CHLORHEXIDINE GLUCONATE 4 % EX SOLN: 60.0000 mL | Freq: Once | CUTANEOUS | Status: DC

## 2024-05-21 MED ORDER — ACETAMINOPHEN 10 MG/ML IV SOLN
INTRAVENOUS | Status: DC | PRN
  Administered 2024-05-21: 1000 mg via INTRAVENOUS

## 2024-05-21 MED ORDER — TRANEXAMIC ACID-NACL 1000-0.7 MG/100ML-% IV SOLN
1000.0000 mg | INTRAVENOUS | Status: AC
  Administered 2024-05-21: 1000 mg via INTRAVENOUS

## 2024-05-21 MED ORDER — CHLORHEXIDINE GLUCONATE 0.12 % MT SOLN
OROMUCOSAL | Status: AC
  Administered 2024-05-21: 15 mL via OROMUCOSAL
  Filled 2024-05-21: qty 15

## 2024-05-21 MED ORDER — DEXAMETHASONE SODIUM PHOSPHATE 10 MG/ML IJ SOLN
INTRAMUSCULAR | Status: AC
  Filled 2024-05-21: qty 1

## 2024-05-21 MED ORDER — ROPINIROLE HCL 0.5 MG PO TABS
1.0000 mg | ORAL_TABLET | Freq: Every day | ORAL | Status: DC
  Administered 2024-05-21 – 2024-05-24 (×4): 1 mg via ORAL
  Filled 2024-05-21 (×4): qty 2

## 2024-05-21 MED ORDER — CEFAZOLIN SODIUM-DEXTROSE 2-4 GM/100ML-% IV SOLN
INTRAVENOUS | Status: AC
  Filled 2024-05-21: qty 100

## 2024-05-21 MED ORDER — LACTATED RINGERS IV SOLN: INTRAVENOUS | Status: DC

## 2024-05-21 MED ORDER — DILTIAZEM HCL ER COATED BEADS 240 MG PO CP24
240.0000 mg | ORAL_CAPSULE | Freq: Every day | ORAL | Status: DC
  Administered 2024-05-21: 240 mg via ORAL
  Filled 2024-05-21 (×2): qty 1
  Filled 2024-05-21: qty 2

## 2024-05-21 MED ORDER — ROCURONIUM BROMIDE 10 MG/ML (PF) SYRINGE
PREFILLED_SYRINGE | INTRAVENOUS | Status: DC | PRN
  Administered 2024-05-21 (×2): 50 mg via INTRAVENOUS

## 2024-05-21 MED ORDER — LORAZEPAM 1 MG PO TABS: 1.0000 mg | ORAL_TABLET | ORAL | Status: DC | PRN

## 2024-05-21 MED ORDER — ROCURONIUM BROMIDE 10 MG/ML (PF) SYRINGE
PREFILLED_SYRINGE | INTRAVENOUS | Status: AC
  Filled 2024-05-21: qty 10

## 2024-05-21 MED ORDER — PHENOL 1.4 % MT LIQD
1.0000 | OROMUCOSAL | Status: DC | PRN
Start: 2024-05-21 — End: 2024-05-25

## 2024-05-21 MED ORDER — PROPOFOL 10 MG/ML IV BOLUS
INTRAVENOUS | Status: AC
  Filled 2024-05-21: qty 20

## 2024-05-21 MED ORDER — ALUM & MAG HYDROXIDE-SIMETH 200-200-20 MG/5ML PO SUSP
30.0000 mL | ORAL | Status: DC | PRN
Start: 2024-05-21 — End: 2024-05-25

## 2024-05-21 MED ORDER — ALBUMIN HUMAN 5 % IV SOLN
12.5000 g | Freq: Once | INTRAVENOUS | Status: AC
  Administered 2024-05-21: 12.5 g via INTRAVENOUS

## 2024-05-21 MED ORDER — CEFAZOLIN SODIUM-DEXTROSE 2-4 GM/100ML-% IV SOLN
2.0000 g | Freq: Four times a day (QID) | INTRAVENOUS | Status: AC
  Administered 2024-05-21 – 2024-05-22 (×2): 2 g via INTRAVENOUS
  Filled 2024-05-21 (×2): qty 100

## 2024-05-21 MED ORDER — HYDROMORPHONE HCL 1 MG/ML IJ SOLN
INTRAMUSCULAR | Status: AC
  Filled 2024-05-21: qty 0.5

## 2024-05-21 MED ORDER — DOCUSATE SODIUM 100 MG PO CAPS
100.0000 mg | ORAL_CAPSULE | Freq: Two times a day (BID) | ORAL | Status: DC
  Administered 2024-05-21 – 2024-05-25 (×8): 100 mg via ORAL
  Filled 2024-05-21 (×8): qty 1

## 2024-05-21 MED ORDER — PROPOFOL 10 MG/ML IV BOLUS
INTRAVENOUS | Status: DC | PRN
  Administered 2024-05-21: 100 mg via INTRAVENOUS

## 2024-05-21 MED ORDER — ONDANSETRON HCL 4 MG/2ML IJ SOLN: 4.0000 mg | Freq: Four times a day (QID) | INTRAMUSCULAR | Status: DC | PRN

## 2024-05-21 MED ORDER — ADULT MULTIVITAMIN W/MINERALS CH
1.0000 | ORAL_TABLET | Freq: Every day | ORAL | Status: DC
  Administered 2024-05-22 – 2024-05-23 (×2): 1 via ORAL
  Filled 2024-05-21 (×2): qty 1

## 2024-05-21 MED ORDER — PANTOPRAZOLE SODIUM 40 MG PO TBEC
40.0000 mg | DELAYED_RELEASE_TABLET | Freq: Every day | ORAL | Status: DC
  Administered 2024-05-21 – 2024-05-25 (×5): 40 mg via ORAL
  Filled 2024-05-21 (×5): qty 1

## 2024-05-21 MED ORDER — DROPERIDOL 2.5 MG/ML IJ SOLN: 0.6250 mg | Freq: Once | INTRAMUSCULAR | Status: DC | PRN

## 2024-05-21 MED ORDER — PHENYLEPHRINE HCL-NACL 20-0.9 MG/250ML-% IV SOLN
INTRAVENOUS | Status: DC | PRN
  Administered 2024-05-21: 50 ug/min via INTRAVENOUS

## 2024-05-21 MED ORDER — ORAL CARE MOUTH RINSE: 15.0000 mL | Freq: Once | OROMUCOSAL | Status: AC

## 2024-05-21 MED ORDER — LIDOCAINE 2% (20 MG/ML) 5 ML SYRINGE
INTRAMUSCULAR | Status: DC | PRN
  Administered 2024-05-21: 60 mg via INTRAVENOUS

## 2024-05-21 MED ORDER — MENTHOL 3 MG MT LOZG: 1.0000 | LOZENGE | OROMUCOSAL | Status: DC | PRN

## 2024-05-21 MED ORDER — MUPIROCIN 2 % EX OINT: 1.0000 | TOPICAL_OINTMENT | Freq: Two times a day (BID) | CUTANEOUS | 0 refills | Status: AC

## 2024-05-21 MED ORDER — PHENYLEPHRINE 80 MCG/ML (10ML) SYRINGE FOR IV PUSH (FOR BLOOD PRESSURE SUPPORT)
PREFILLED_SYRINGE | INTRAVENOUS | Status: DC | PRN
  Administered 2024-05-21 (×2): 80 ug via INTRAVENOUS

## 2024-05-21 MED ORDER — OXYCODONE HCL 5 MG PO TABS
2.5000 mg | ORAL_TABLET | ORAL | Status: DC | PRN
  Administered 2024-05-22 – 2024-05-24 (×7): 5 mg via ORAL
  Filled 2024-05-21 (×7): qty 1

## 2024-05-21 MED ORDER — MAGNESIUM CITRATE PO SOLN: 1.0000 | Freq: Once | ORAL | Status: DC | PRN

## 2024-05-21 MED ORDER — VITAMIN B-12 1000 MCG PO TABS
2000.0000 ug | ORAL_TABLET | Freq: Every day | ORAL | Status: DC
  Administered 2024-05-21 – 2024-05-25 (×5): 2000 ug via ORAL
  Filled 2024-05-21 (×5): qty 2

## 2024-05-21 MED ORDER — 0.9 % SODIUM CHLORIDE (POUR BTL) OPTIME
TOPICAL | Status: DC | PRN
  Administered 2024-05-21: 1000 mL

## 2024-05-21 MED ORDER — OXYCODONE HCL 5 MG PO TABS: 5.0000 mg | ORAL_TABLET | Freq: Once | ORAL | Status: DC | PRN

## 2024-05-21 MED ORDER — CHLORHEXIDINE GLUCONATE 4 % EX SOLN: 1.0000 | CUTANEOUS | 1 refills | Status: DC

## 2024-05-21 MED ORDER — POVIDONE-IODINE 10 % EX SWAB
2.0000 | Freq: Once | CUTANEOUS | Status: AC
  Administered 2024-05-21: 2 via TOPICAL

## 2024-05-21 SURGICAL SUPPLY — 39 items
ADAPTER BIOLOX TAPER -3 (Miscellaneous) IMPLANT
BAG COUNTER SPONGE SURGICOUNT (BAG) ×2 IMPLANT
BIT DRILL 7/64X5 DISP (BIT) ×2 IMPLANT
BLADE SAW SGTL 18X1.27X75 (BLADE) ×2 IMPLANT
BLADE SURG 15 STRL SS SAFETY (BLADE) IMPLANT
BNDG COHESIVE 6X5 TAN ST LF (GAUZE/BANDAGES/DRESSINGS) ×2 IMPLANT
CHLORAPREP W/TINT 26 (MISCELLANEOUS) ×2 IMPLANT
COVER PERINEAL POST (MISCELLANEOUS) ×2 IMPLANT
COVER SURGICAL LIGHT HANDLE (MISCELLANEOUS) ×2 IMPLANT
DERMABOND ADVANCED .7 DNX12 (GAUZE/BANDAGES/DRESSINGS) ×2 IMPLANT
DRAPE C-ARM 42X72 X-RAY (DRAPES) ×2 IMPLANT
DRAPE IMP U-DRAPE 54X76 (DRAPES) ×2 IMPLANT
DRAPE STERI IOBAN 125X83 (DRAPES) ×2 IMPLANT
DRESSING AQUACEL AG SP 3.5X6 (GAUZE/BANDAGES/DRESSINGS) ×2 IMPLANT
DRSG AQUACEL AG ADV 3.5X10 (GAUZE/BANDAGES/DRESSINGS) IMPLANT
ELECTRODE REM PT RTRN 9FT ADLT (ELECTROSURGICAL) ×2 IMPLANT
GLOVE BIO SURGEON STRL SZ 6.5 (GLOVE) ×6 IMPLANT
GLOVE BIO SURGEON STRL SZ7.5 (GLOVE) ×6 IMPLANT
GLOVE BIOGEL PI IND STRL 6.5 (GLOVE) ×2 IMPLANT
GLOVE BIOGEL PI IND STRL 7.5 (GLOVE) ×2 IMPLANT
GOWN STRL REUS W/ TWL LRG LVL3 (GOWN DISPOSABLE) ×4 IMPLANT
HEAD CERAMIC BIOLOX 36MM (Head) IMPLANT
HOOD PEEL AWAY T7 (MISCELLANEOUS) ×6 IMPLANT
KIT BASIN OR (CUSTOM PROCEDURE TRAY) ×2 IMPLANT
KIT TURNOVER KIT B (KITS) ×2 IMPLANT
LINER ACETAB NN G7 F 36 (Liner) IMPLANT
MANIFOLD NEPTUNE II (INSTRUMENTS) ×2 IMPLANT
NS IRRIG 1000ML POUR BTL (IV SOLUTION) ×2 IMPLANT
PACK TOTAL JOINT (CUSTOM PROCEDURE TRAY) ×2 IMPLANT
PACK UNIVERSAL I (CUSTOM PROCEDURE TRAY) ×2 IMPLANT
PAD ARMBOARD POSITIONER FOAM (MISCELLANEOUS) ×4 IMPLANT
SHELL ACET G7 4H 56 SZF (Shell) IMPLANT
STEM FEM CMTL 15X150 133D (Stem) IMPLANT
SUT ETHIBOND NAB CT1 #1 30IN (SUTURE) IMPLANT
SUT MON AB 2-0 CT1 36 (SUTURE) IMPLANT
SUT VIC AB 0 CT1 27XBRD ANBCTR (SUTURE) IMPLANT
TOWEL GREEN STERILE (TOWEL DISPOSABLE) ×2 IMPLANT
TUBE SUCT ARGYLE STRL (TUBING) ×2 IMPLANT
WATER STERILE IRR 1000ML POUR (IV SOLUTION) ×2 IMPLANT

## 2024-05-21 NOTE — Discharge Instructions (Addendum)
 Orthopaedic Trauma Service Discharge Instructions  General Discharge Instructions  WEIGHT BEARING STATUS: Weightbearing as tolerated right lower extremity  RANGE OF MOTION/ACTIVITY: Unrestricted range of motion of the right hip and knee.  No hip precautions needed  Wound Care: Keep Aquacel dressing in place over right hip incision until follow-up.  If dressing becomes soiled or water  is caught beneath the seal, please remove dressing clean incision gently with soap and water  before applying a new dry bandage  DVT/PE prophylaxis: Resume your home dose Coumadin  Diet: as you were eating previously.  Can use over the counter stool softeners and bowel preparations, such as Miralax , to help with bowel movements.  Narcotics can be constipating.  Be sure to drink plenty of fluids  PAIN MEDICATION USE AND EXPECTATIONS  You have likely been given narcotic medications to help control your pain.  After a traumatic event that results in an fracture (broken bone) with or without surgery, it is ok to use narcotic pain medications to help control one's pain.  We understand that everyone responds to pain differently and each individual patient will be evaluated on a regular basis for the continued need for narcotic medications. Ideally, narcotic medication use should last no more than 6-8 weeks (coinciding with fracture healing).   As a patient it is your responsibility as well to monitor narcotic medication use and report the amount and frequency you use these medications when you come to your office visit.   We would also advise that if you are using narcotic medications, you should take a dose prior to therapy to maximize you participation.  IF YOU ARE ON NARCOTIC MEDICATIONS IT IS NOT PERMISSIBLE TO OPERATE A MOTOR VEHICLE (MOTORCYCLE/CAR/TRUCK/MOPED) OR HEAVY MACHINERY DO NOT MIX NARCOTICS WITH OTHER CNS (CENTRAL NERVOUS SYSTEM) DEPRESSANTS SUCH AS ALCOHOL  POST-OPERATIVE OPIOID TAPER  INSTRUCTIONS: It is important to wean off of your opioid medication as soon as possible. If you do not need pain medication after your surgery it is ok to stop day one. Opioids include: Codeine, Hydrocodone (Norco, Vicodin), Oxycodone (Percocet, oxycontin ) and hydromorphone  amongst others.  Long term and even short term use of opiods can cause: Increased pain response Dependence Constipation Depression Respiratory depression And more.  Withdrawal symptoms can include Flu like symptoms Nausea, vomiting And more Techniques to manage these symptoms Hydrate well Eat regular healthy meals Stay active Use relaxation techniques(deep breathing, meditating, yoga) Do Not substitute Alcohol to help with tapering If you have been on opioids for less than two weeks and do not have pain than it is ok to stop all together.  Plan to wean off of opioids This plan should start within one week post op of your fracture surgery  Maintain the same interval or time between taking each dose and first decrease the dose.  Cut the total daily intake of opioids by one tablet each day Next start to increase the time between doses. The last dose that should be eliminated is the evening dose.    STOP SMOKING OR USING NICOTINE PRODUCTS!!!!  As discussed nicotine severely impairs your body's ability to heal surgical and traumatic wounds but also impairs bone healing.  Wounds and bone heal by forming microscopic blood vessels (angiogenesis) and nicotine is a vasoconstrictor (essentially, shrinks blood vessels).  Therefore, if vasoconstriction occurs to these microscopic blood vessels they essentially disappear and are unable to deliver necessary nutrients to the healing tissue.  This is one modifiable factor that you can do to dramatically increase your chances of  healing your injury.  (This means no smoking, no nicotine  gum, patches, etc)  DO NOT USE NONSTEROIDAL ANTI-INFLAMMATORY DRUGS (NSAID'S)  Using products such  as Advil (ibuprofen), Aleve (naproxen), Motrin (ibuprofen) for additional pain control during fracture healing can delay and/or prevent the healing response.  If you would like to take over the counter (OTC) medication, Tylenol  (acetaminophen ) is ok.  However, some narcotic medications that are given for pain control contain acetaminophen  as well. Therefore, you should not exceed more than 4000 mg of tylenol  in a day if you do not have liver disease.  Also note that there are may OTC medicines, such as cold medicines and allergy medicines that my contain tylenol  as well.  If you have any questions about medications and/or interactions please ask your doctor/PA or your pharmacist.      ICE AND ELEVATE INJURED/OPERATIVE EXTREMITY  Using ice and elevating the injured extremity above your heart can help with swelling and pain control.  Icing in a pulsatile fashion, such as 20 minutes on and 20 minutes off, can be followed.    Do not place ice directly on skin. Make sure there is a barrier between to skin and the ice pack.    Using frozen items such as frozen peas works well as the conform nicely to the are that needs to be iced.  USE AN ACE WRAP OR TED HOSE FOR SWELLING CONTROL  In addition to icing and elevation, Ace wraps or TED hose are used to help limit and resolve swelling.  It is recommended to use Ace wraps or TED hose until you are informed to stop.    When using Ace Wraps start the wrapping distally (farthest away from the body) and wrap proximally (closer to the body)   Example: If you had surgery on your leg or thing and you do not have a splint on, start the ace wrap at the toes and work your way up to the thigh        If you had surgery on your upper extremity and do not have a splint on, start the ace wrap at your fingers and work your way up to the upper arm  CALL THE OFFICE FOR MEDICATION REFILLS OR WITH ANY QUESTIONS/CONCERNS: [phone]   VISIT OUR WEBSITE FOR ADDITIONAL INFORMATION:  [email or web address]     Call office for the following: Temperature greater than 101F Persistent nausea and vomiting Severe uncontrolled pain Redness, tenderness, or signs of infection (pain, swelling, redness, odor or green/yellow discharge around the site) Difficulty breathing, headache or visual disturbances Hives Persistent dizziness or light-headedness Extreme fatigue Any other questions or concerns you may have after discharge  In an emergency, call 911 or go to an Emergency Department at a nearby hospital  OTHER HELPFUL INFORMATION  You should wean off your narcotic medicines as soon as you are able.  Most patients will be off or using minimal narcotics before their first postop appointment.   We suggest you use the pain medication the first night prior to going to bed, in order to ease any pain when the anesthesia wears off. You should avoid taking pain medications on an empty stomach as it will make you nauseous.  Do not drink alcoholic beverages or take illicit drugs when taking pain medications.   You may return to work/school in the next couple of days when you feel up to it.   Pain medication may make you constipated.  Below are a few solutions to try in this  order: Decrease the amount of pain medication if you aren't having pain. Drink lots of decaffeinated fluids. Drink prune juice and/or each dried prunes  If the first 3 don't work start with additional solutions Take Colace - an over-the-counter stool softener Take Senokot - an over-the-counter laxative Take Miralax  - a stronger over-the-counter laxative                                       Outpatient Substance Abuse  Treatment- uninsured  Narcotics Anonymous 24-HOUR HELPLINE Pre-recorded for Meeting Schedules [facility] [phone]  [email or web address] ALCOHOLICS ANONYMOUS  [place]   Answering Service [phone] Please Note: All [place] Meetings are  Non-smoking [email or web address].es  Alcohol and Drug Services -  Insurance: Medicaid /State funding/private insurance Methadone, suboxone/Intensive outpatient  [place]   [phone] Fax: [phone] [address], [place], [place], [address] [place] [phone] Fax: [phone]    [address] [place], [place], [address] ([address] [place], [place], [place], [place], [place], [place], [place], [place]) [facility] [email or web address]/ Accepts State funding/Medicaid Transitional housing, Intensive Outpatient Treatment, Outpatient treatment, Veterans Services  Phone: [phone] Fax: [phone] Address: [address] [place] [address]   [employer] [employer] [facility] (http://[email or web address].info/) Insurance: Medicaid Case Management, Administrator, arts, Medication Management, Outpatient Therapy, Psychosocial Rehabilitation, Substance Abuse Intensive Outpatient  Phone: [phone] Fax: [phone] [address], [place], [place], [address]  [facility] Medicaid, most private insurance providers Types of Program: Individual/Group Therapy, Substance Abuse Treatment  Phone: [place] [phone] Fax: [phone] [address], [place], [place], [address] [place] [phone] [address], [address] [place], [place], [address]  [facility]  Medicaid Types of Program: SAIOP  Phone: [phone] Fax: [phone] [address] [place], [place], [place], [address] [facility] Medicaid/state funds Crisis line [phone] [facility] [phone] [place] [phone] [place] [phone]  [facility] [address];  [place], [place] [address] [phone]  Substance Abuse Intensive Outpatient Program [facility] [address] [place], [place] [address] [phone]- Substance abuse treatment  [facility]  Insurance: [facility], [address], sliding scale Types of Program: substance abuse treatment, transportation assistance Phone: [phone] Fax: [phone] Address: [address], [place] [address] [facility] ([email or web address].[email or web address]) Insurance: UHC, [place], [place], [place] of [place] Program: addiction counseling, detoxification,  Phone: [phone]  Fax: [phone] Address: [address] [place], [place] [address]  [facility] [place] (statewide facilities/programs) [address] (Medicaid/state funds) [place], [place] [address]                      [email or web address]/ [phone] [place] mcalpine- [phone] [place]- [phone] [facility] [place] (2 Locations) (Medicaid/state funds) --[address]  walk in 8:30-12 and 1-2:30 [place], [id number]   [facility]- [phone] --[address] [place], [place] [address]  [phone] walk in 8:30-12 and 2-3:30  [facility] state funds/medicaid [address] [place], [place] [address] [id number] [facility] (Suboxone clinic) Medicaid/state funds  [address]  [place], [place] [address] [phone]   [facility]  [address] [place], [place] [address]  [phone] (24 hours) [place]- [address] [place], [place] [address]  [phone] (24 hours) [place]-  [address] [phone] [place]- [address] [place] [phone] [place]- [address] [place] [phone] [facility]- Medicaid and state funds  [place]- [address] [place], [place] [address] [phone] (24 hours) [place]- [address] [place], [place] [address] [phone] [facility]- [address] [place], [place] [address] [phone] (24 hours) [place] [address] [place], [place]  [address] [phone] [place]- [address] [place] [phone]          [facility] Stay Proper nutrition can help your body recover from illness and  injury.   Foods and beverages high in protein, vitamins, and minerals help rebuild muscle loss, promote healing, & reduce fall risk.   In addition to eating healthy foods, a nutrition shake is an easy, delicious way to get the nutrition you need during and after your hospital stay  It is recommended that you continue to drink 2 bottles per day of:       Ensure for at least 1 month (30 days) after your hospital stay   Tips for adding a nutrition shake into your routine: As allowed, drink one with vitamins or medications instead of water  or juice Enjoy one as a tasty mid-morning or afternoon snack Drink cold or make a milkshake out of it Drink one instead of milk with cereal or snacks Use as a coffee creamer   Available at the following grocery stores and pharmacies:           * [place] [facility] * [employer] * Costco  * Rite Aid          * Walmart * Sam's Club  * Walgreens      * Target  * BJ's   * CVS  * Lowes Foods   * [facility] [phone]            For COUPONS visit: [email or web address].com/join or [email or web address]   Suggested Substitutions Ensure Plus = Boost Plus = Carnation Breakfast Essentials = Boost Compact Ensure Active Clear = Boost Breeze Glucerna Shake = Boost Glucose Control = Carnation Breakfast Essentials SUGAR FREE

## 2024-05-21 NOTE — Op Note (Signed)
 Orthopaedic Surgery Operative Note (CSN: [id number] ) Date of Surgery: 05/21/2024  Admit Date: 05/21/2024   Diagnoses: Pre-Op Diagnoses: Right displaced femoral neck fracture  Post-Op Diagnosis: Same  Procedures: CPT 27130-Right total hip arthroplasty for femoral neck fracture  Surgeons : Primary: Kendal Franky SQUIBB, MD  Assistant: Lauraine Moores, PA-C  Location: OR 3   Anesthesia: General   Antibiotics: Ancef  2g preop with 1 gm vancomycin  powder placed topically   Tourniquet time: None    Estimated Blood Loss: 500 mL  Complications:None  Specimens:* No specimens in log *   Implants: Implant Name Type Inv. Item Serial No. Manufacturer Lot No. LRB No. Used Action  SHELL ACET G7 4H 56 SZF - [id number] Shell SHELL ACET G7 4H 56 SZF  ZIMMER RECON(ORTH,TRAU,BIO,SG) [id number] Right 1 Implanted  LINER ACETAB NN G7 F 36 - [id number] Liner LINER ACETAB NN G7 F 36  ZIMMER [employer]ON(ORTH,TRAU,BIO,SG) [id number] Right 1 Implanted  STEM FEM CMTL 15X150 133D - [id number] Stem STEM FEM CMTL 15X150 133D  ZIMMER RECON(ORTH,TRAU,BIO,SG) [id number] Right 1 Implanted  ADAPTER BIOLOX TAPER -3 - [id number] Miscellaneous ADAPTER BIOLOX TAPER -3  ZIMMER RECON(ORTH,TRAU,BIO,SG) [id number] Right 1 Implanted  HEAD CERAMIC BIOLOX - [id number] Head HEAD CERAMIC BIOLOX  ZIMMER RECON(ORTH,TRAU,BIO,SG) [id number] Right 1 Implanted     Indications for Surgery: 68 year old male who sustained a right displaced femoral neck fracture.  Due to his age and his mobility level I recommend proceeding with a right total hip arthroplasty.  Risks and benefits were discussed with the patient.  Risks include but not limited to bleeding, infection, periprosthetic fracture, hip dislocation, nerve and blood vessel injury, leg length discrepancy, DVT, even the possibility anesthetic complications.  He agreed to proceed with surgery and consent was obtained.  Operative Findings: 1.  Right total hip arthroplasty through anterior  approach using [employer] G7 acetabular system with a 56 mm shell 2.  G7 acetabular system vitamin E highly cross-linked polyethylene neutral liner for 36 mm head 3.  Zimmer [employer]omet Taperloc reduced distal size 15 stem high offset with press-fit fixation 4.  Biomet 36 mm modular head ceramic Biolox with a -3 neck taper  Procedure: The patient was identified in the preoperative holding area. Consent was confirmed with the patient and their family and all questions were answered. The operative extremity was marked after confirmation with the patient. he was then brought back to the operating room by our anesthesia colleagues.  He was placed under general anesthetic and carefully transferred over to the [facility] table.  Fluoroscopic imaging showed the unstable nature of his fracture.  The right hip was then prepped and draped in usual sterile fashion.  A timeout was performed to verify the patient, the procedure, and the extremity.  Preoperative antibiotics were dosed.  A standard anterior approach to the hip was made and carried down through skin and subcutaneous tissue.  I incised through the TFL fascia and developed the interval between the TFL and sartorius down through the hip interval.  I cauterized the crossing vessels in the inferior portion of the incision.  Having cleared the anterior capsule and then performed a capsulotomy entering the hip joint.  I released the capsule all the way down to the lesser trochanter.  I then performed a femoral neck cut.  I removed the intercalary neck as well as the femoral head.  I measured this and the measured 45 mm.  I then proceeded to clean the periphery of the acetabulum with soft tissue and the labrum.  I then proceeded to sequentially reamed from 45 mm to 55 mm.  I got good peripheral fit with healthy cancellous bone bleeding after reaming.  I then press-fit a 56 mm acetabular shell.  I got good fixation with no motion.  I did not feel that a supplemental screw  was necessary.  I then proceeded to place the acetabular liner for 36 mm head with a neutral offset.  We turned our attention then to the femur.  I then delivered the femur after externally rotated to about 125 degrees.  I performed a superior capsular release to help deliver the femur for access for broaching.  I used a canal finder to find the center of the canal.  I then sequentially broached until I reached a good proximal fit with a rotational control using the size 15 stem.  I then trialed this using a high offset neck with a -3, 36 mm head.  The leg lengths were relatively symmetric and I got good stability of the hip.  I then removed the trial implants and proceeded to place a high offset size 15 reduced distal Taperloc stem.  This had excellent proximal fixation after placing the stem.  I then proceeded to place a ceramic 36 mm head with a -3 modular neck adapter.  The hip was reduced.  It was stable without any signs of subluxation or dislocation.  Final fluoroscopic imaging was obtained.  The incision was copiously irrigated.  A gram of vancomycin  powder was placed into the incision.  A layered closure of #1 Ethibond for the capsule with 0 Vicryl for the TFL fascia and 2-0 Monocryl and 3-0 Monocryl with Dermabond for the skin.  Sterile dressing was applied.  The patient was then awoke from anesthesia and taken to the PACU in stable condition.  Post Op Plan/Instructions: The patient be weightbearing as tolerated to the right lower extremity.  He will not have any hip precautions.  We will have him mobilize with physical occupational therapy.  He will receive postoperative Ancef .  If his hemoglobin is stable postoperatively he can be restarted on his Eliquis  postoperative day 2.  I was present and performed the entire surgery.  Lauraine Moores, PA-C did assist me throughout the case. An assistant was necessary given the difficulty in approach, maintenance of reduction and ability to instrument the  fracture.   Franky Light, MD [facility]

## 2024-05-21 NOTE — ED Notes (Signed)
 Trauma Response Nurse Documentation  Jeffrey Randolph is a 68 y.o. male arriving to [facility] ED via EMS  On Eliquis  (apixaban ) daily. Trauma was activated as a Level 2 based on the following trauma criteria Elderly patients > 65 with head trauma on anti-coagulation (excluding ASA).  Patient cleared for CT by Dr. Neysa. Pt transported to CT with trauma response nurse present to monitor. RN remained with the patient throughout their absence from the department for clinical observation.   GCS 15.  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     Past Surgical History:  Procedure Laterality Date   ESOPHAGOGASTRODUODENOSCOPY (EGD) WITH PROPOFOL  N/A 01/06/2023   Procedure: ESOPHAGOGASTRODUODENOSCOPY (EGD) WITH PROPOFOL ;  Surgeon: [name], MD;  Location: [facility] ENDOSCOPY;  Service: Gastroenterology;  Laterality: N/A;  Wants close to 9 AM arrival   LUMBAR LAMINECTOMY/DECOMPRESSION MICRODISCECTOMY Right 01/09/2015   Procedure: LUMBAR LAMINECTOMY/DECOMPRESSION MICRODISCECTOMY 1 LEVEL;  Surgeon: [name], MD;  Location: [facility] NEURO ORS;  Service: Neurosurgery;  Laterality: Right;  LUMBAR LAMINECTOMY/DECOMPRESSION MICRODISCECTOMY 1 LEVEL RIGHT LUMBAR 2-3   TONSILLECTOMY       Initial Focused Assessment (If applicable, or please see trauma documentation): Patient A&Ox4, GCS 15, PERR 3 Airway intact, bilateral breath sounds Pulse 2+ Obvious deformity to  R hip  CT's Completed:   CT Head, CT C-Spine, CT Chest w/ contrast, and CT abdomen/pelvis w/ contrast   Interventions:  IV, labs  CXR/PXR 4 mg Morphine  CT Head/Cspine/C/A/P LR bolus  Plan for disposition:  Admission to floor   Consults completed:  Orthopaedic Surgeon at 0818, arrival Jeffrey Randolph.  Event Summary: Patient to ED after a fall at home. Per patient he lost his balance and fell backwards, hitting his right side. Immediate pain to right hip/back. He was able to call EMS and was transported to the hospital. C-collar placed on arrival. Imaging was ordered and revealed a R femoral neck fx. Orthopedics was consulted and plan is for surgical fixation. Patients son Jeffrey Randolph called and updated on plan per patient request. Belongings placed in bag including cut clothing, wallet. Patient requested cell phone be placed at bedside.  Bedside handoff with ED RN Jeffrey Randolph.    Jeffrey Randolph  Trauma Response RN  Please call TRN at [phone] for further assistance.

## 2024-05-21 NOTE — Transfer of Care (Signed)
 Immediate Anesthesia Transfer of Care Note  Patient: Jeffrey Randolph  Procedure(s) Performed: ARTHROPLASTY, HIP, TOTAL, ANTERIOR APPROACH (Right: Hip)  Patient Location: PACU  Anesthesia Type:General  Level of Consciousness: drowsy  Airway & Oxygen Therapy: Patient Spontanous Breathing and Patient connected to face mask oxygen  Post-op Assessment: Report given to RN and Post -op Vital signs reviewed and stable  Post vital signs: Reviewed and stable  Last Vitals:  Vitals Value Taken Time  BP 94/51 05/21/24 18:54  Temp    Pulse 86 05/21/24 18:58  Resp 21 05/21/24 18:58  SpO2 97 % 05/21/24 18:58  Vitals shown include unfiled device data.  Last Pain:  Vitals:   05/21/24 1345  TempSrc: Oral  PainSc: 8          Complications: No notable events documented.

## 2024-05-21 NOTE — Progress Notes (Signed)
 Orthopedic Tech Progress Note Patient Details:  [name] 12-15-1955 [id number]  Patient ID: Jeffrey Randolph, male   DOB: November 18, 1955, 68 y.o.   MRN: [id number]  Level II trauma, no ortho tech orders at this time.   Giovanni LITTIE Lukes 05/21/2024, 6:58 AM

## 2024-05-21 NOTE — Progress Notes (Signed)
 CSW added substance abuse resources to patient's AVS.  Niels Portugal, MSW, LCSW Transitions of Care  Clinical Social Worker II [phone]

## 2024-05-21 NOTE — ED Notes (Signed)
Pt to short stay  

## 2024-05-21 NOTE — Anesthesia Preprocedure Evaluation (Addendum)
 Anesthesia Evaluation  Patient identified by MRN, date of birth, ID band Patient awake    Reviewed: Allergy & Precautions, H&P , NPO status , Patient's Chart, lab work & pertinent test results  Airway Mallampati: II  TM Distance: >3 FB Neck ROM: Full    Dental  (+) Edentulous Upper, Edentulous Lower   Pulmonary former smoker   Pulmonary exam normal breath sounds clear to auscultation       Cardiovascular hypertension, (-) Past MI Normal cardiovascular exam+ dysrhythmias Atrial Fibrillation  Rhythm:Regular Rate:Normal     Neuro/Psych CVA  negative psych ROS   GI/Hepatic ,GERD  ,,(+) Cirrhosis         Endo/Other  negative endocrine ROS    Renal/GU negative Renal ROS  negative genitourinary   Musculoskeletal negative musculoskeletal ROS (+)    Abdominal   Peds negative pediatric ROS (+)  Hematology Last eliquis  8/14   Anesthesia Other Findings   Reproductive/Obstetrics negative OB ROS                              Anesthesia Physical Anesthesia Plan  ASA: 3  Anesthesia Plan: General   Post-op Pain Management: Toradol IV (intra-op)*   Induction: Intravenous  PONV Risk Score and Plan: 2 and Ondansetron , Dexamethasone  and Treatment may vary due to age or medical condition  Airway Management Planned: Oral ETT  Additional Equipment: None  Intra-op Plan:   Post-operative Plan: Extubation in OR  Informed Consent: I have reviewed the patients History and Physical, chart, labs and discussed the procedure including the risks, benefits and alternatives for the proposed anesthesia with the patient or authorized representative who has indicated his/her understanding and acceptance.   Patient has DNR.  Discussed DNR with patient and Suspend DNR.   Dental advisory given  Plan Discussed with: CRNA  Anesthesia Plan Comments:         Anesthesia Quick Evaluation

## 2024-05-21 NOTE — Progress Notes (Signed)
 Initial Nutrition Assessment  DOCUMENTATION CODES:   Not applicable  INTERVENTION:  When pt out of surgery and placed on diet, recommend: Liberalized diet to regular to provide increased options to pt and promote adequate intake for post op healing Ensure Plus High Protein po BID, each supplement provides 350 kcal and 20 grams of protein.  Pt is at risk for refeeding syndrome given chronic alcohol abuse. Monitor magnesium  and phosphorus daily x 5 days, MD to replete as needed. 100mg  thiamine  x 7 days  Continued MVI w/ minerals and folic acid  supplementation daily  NUTRITION DIAGNOSIS:   Increased nutrient needs related to post-op healing, hip fracture as evidenced by estimated needs.  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 Admitted after fall with R hip pain, diagnosed with displaced R femoral fx and taken to OR for R hip total arthroplasty.  Pt unavailable at time of assessment, currently in surgery. Pt placed in CIWA protocol for alcohol level 38mg /dL at time of admission. Suspect chronic alcohol intake may have impact on oral PO intake and pt at risk for refeeding syndrome. Will order electrolyte labs and monitor until stable. Pt would benefit from ONS like Ensure to aid in post op recovery and would benefit from liberalized regular diet to provide increased options for intake to promote post op healing.   No recent wt changes seen in chart review, will conduct nutrition focused physical exam at follow up.   Medications reviewed and include:  Cefazolin  Thiamine  100mg  MVI w/ minerals  Folic acid   Labs reviewed:  Alcohol 38 mg/dL Potassium 3.4 Sodium 877/Ryonmpiz 88 AST  45  NUTRITION - FOCUSED PHYSICAL EXAM:  Deferred to follow up  Diet Order:   Diet Order             Diet NPO time specified  Diet effective now                   EDUCATION NEEDS:   Not appropriate for education at this time  Skin:  Skin Assessment: Reviewed RN Assessment  Last BM:  unknown  Height:   Ht Readings from Last 1 Encounters:  05/21/24 5' 9 (1.753 m)    Weight:   Wt Readings from Last 1 Encounters:  05/21/24 77.1 kg    Ideal Body Weight:  72.7 kg  BMI:  Body mass index is 25.1 kg/m.  Estimated Nutritional Needs:   Kcal:  2000-2200  Protein:  90-105g  Fluid:  >/= 2L    Josette Glance, MS, RDN, LDN Clinical Dietitian I Please reach out via secure chat

## 2024-05-21 NOTE — Consult Note (Signed)
 Reason for Consult:Right hip fx Referring Physician: Caron Salt Time called: [id number] Time at bedside: 0855   Jeffrey Randolph is an 68 y.o. male.  HPI: Jeffrey Randolph lost his balance this morning and fell backwards. He had immediate right hip pain and could not get up. He was brought to the ED where x-rays showed a right hip fx and orthopedic surgery was consulted. He c/o localized pain to the hip. He lives alone and does not use any assistive devices to ambulate. He is officially on Eliquis  but hasn't taken it in several days because he doesn't like all the bruising.  Past Medical History:  Diagnosis Date   Atrial fibrillation [facility])    Atrial fibrillation with rapid ventricular response (HCC)    BPH (benign prostatic hyperplasia)    Cirrhosis (HCC)    CVA (cerebral infarction)    Dysrhythmia    GERD (gastroesophageal reflux disease)    Hemiplegia due to old stroke (HCC)    History of stroke 05/26/2019   HNP (herniated nucleus pulposus), lumbar 01/09/2015   Hypertension    Hypokalemia    Hypomagnesemia 05/26/2019   Hyponatremia    Neuropathy    RLS (restless legs syndrome)    Stroke (HCC)    Left foot always feel 2 x larger, walks with a limp.    Past Surgical History:  Procedure Laterality Date   ESOPHAGOGASTRODUODENOSCOPY (EGD) WITH PROPOFOL  N/A 01/06/2023   Procedure: ESOPHAGOGASTRODUODENOSCOPY (EGD) WITH PROPOFOL ;  Surgeon: [name], MD;  Location: [facility] ENDOSCOPY;  Service: Gastroenterology;  Laterality: N/A;  Wants close to 9 AM arrival   LUMBAR LAMINECTOMY/DECOMPRESSION MICRODISCECTOMY Right 01/09/2015   Procedure: LUMBAR LAMINECTOMY/DECOMPRESSION MICRODISCECTOMY 1 LEVEL;  Surgeon: [name], MD;  Location: [facility] NEURO ORS;  Service: Neurosurgery;  Laterality: Right;  LUMBAR LAMINECTOMY/DECOMPRESSION MICRODISCECTOMY 1 LEVEL RIGHT LUMBAR 2-3   TONSILLECTOMY      Family History  Problem Relation Age of Onset   Pancreatic cancer Mother     Social History:  reports that he has quit  smoking. His smoking use included cigarettes. He has never used smokeless tobacco. He reports that he does not currently use alcohol. He reports that he does not use drugs.  Allergies: No Known Allergies  Medications: I have reviewed the patient's current medications.  Results for orders placed or performed during the hospital encounter of 05/21/24 (from the past 48 hours)  Comprehensive metabolic panel     Status: Abnormal   Collection Time: 05/21/24  6:57 AM  Result Value Ref Range   Sodium 121 (L) 135 - 145 mmol/L   Potassium 3.4 (L) 3.5 - 5.1 mmol/L   Chloride 92 (L) 98 - 111 mmol/L   CO2 17 (L) 22 - 32 mmol/L   Glucose, Bld 126 (H) 70 - 99 mg/dL    Comment: Glucose reference range applies only to samples taken after fasting for at least 8 hours.   BUN <5 (L) 8 - 23 mg/dL   Creatinine, Ser 9.48 (L) 0.61 - 1.24 mg/dL   Calcium 8.2 (L) 8.9 - 10.3 mg/dL   Total Protein 6.7 6.5 - 8.1 g/dL   Albumin  3.3 (L) 3.5 - 5.0 g/dL   AST 45 (H) 15 - 41 U/L   ALT 23 0 - 44 U/L   Alkaline Phosphatase 127 (H) 38 - 126 U/L   Total Bilirubin 1.5 (H) 0.0 - 1.2 mg/dL   GFR, Estimated >39 >39 mL/min    Comment: (NOTE) Calculated using the CKD-EPI Creatinine Equation (2021)    Anion gap  12 5 - 15    Comment: Performed at [facility], [address] [place], [place] [address]  CBC     Status: Abnormal   Collection Time: 05/21/24  6:57 AM  Result Value Ref Range   WBC 5.6 4.0 - 10.5 K/uL   RBC 3.10 (L) 4.22 - 5.81 MIL/uL   Hemoglobin 11.3 (L) 13.0 - 17.0 g/dL   HCT 67.9 (L) 60.9 - 47.9 %   MCV 103.2 (H) 80.0 - 100.0 fL   MCH 36.5 (H) 26.0 - 34.0 pg   MCHC 35.3 30.0 - 36.0 g/dL   RDW 81.0 (H) 88.4 - 84.4 %   Platelets 179 150 - 400 K/uL   nRBC 0.7 (H) 0.0 - 0.2 %    Comment: Performed at [facility], [address], [place], [place] [address]  Ethanol     Status: Abnormal   Collection Time: 05/21/24  6:57 AM  Result Value Ref Range   Alcohol, Ethyl (B) 38 (H) <15 mg/dL     Comment: (NOTE) For medical purposes only. Performed at [facility], [address] [place], [place] [address]   Protime-INR     Status: Abnormal   Collection Time: 05/21/24  6:57 AM  Result Value Ref Range   Prothrombin Time 17.2 (H) 11.4 - 15.2 seconds   INR 1.3 (H) 0.8 - 1.2    Comment: (NOTE) INR goal varies based on device and disease states. Performed at [facility], [address], [place], [place] [address]   Type and screen [facility]     Status: None   Collection Time: 05/21/24  6:57 AM  Result Value Ref Range   ABO/RH(D) O POS    Antibody Screen NEG    Sample Expiration      05/24/2024,2359 Performed at [facility], [address] [place], [place] [address]   APTT     Status: None   Collection Time: 05/21/24  6:57 AM  Result Value Ref Range   aPTT 36 24 - 36 seconds    Comment: Performed at [facility], [address] [place], [place] [address]  ABO/Rh     Status: None   Collection Time: 05/21/24  7:05 AM  Result Value Ref Range   ABO/RH(D)      O POS Performed at [facility], [address], [place], [place] [address]   I-Stat Chem 8, ED     Status: Abnormal   Collection Time: 05/21/24  7:12 AM  Result Value Ref Range   Sodium 122 (L) 135 - 145 mmol/L   Potassium 3.4 (L) 3.5 - 5.1 mmol/L   Chloride 88 (L) 98 - 111 mmol/L   BUN <3 (L) 8 - 23 mg/dL   Creatinine, Ser 9.49 (L) 0.61 - 1.24 mg/dL   Glucose, Bld 875 (H) 70 - 99 mg/dL    Comment: Glucose reference range applies only to samples taken after fasting for at least 8 hours.   Calcium, Ion 1.03 (L) 1.15 - 1.40 mmol/L   TCO2 19 (L) 22 - 32 mmol/L   Hemoglobin 11.9 (L) 13.0 - 17.0 g/dL   HCT 64.9 (L) 60.9 - 47.9 %  I-Stat Lactic Acid, ED     Status: Abnormal   Collection Time: 05/21/24  7:12 AM  Result Value Ref Range   Lactic Acid, Venous 4.0 (HH) 0.5 - 1.9 mmol/L   Comment NOTIFIED PHYSICIAN   Urinalysis,  Routine w reflex microscopic -Urine,  Clean Catch     Status: None   Collection Time: 05/21/24  7:42 AM  Result Value Ref Range   Color, Urine YELLOW YELLOW   APPearance CLEAR CLEAR   Specific Gravity, Urine 1.005 1.005 - 1.030   pH 5.0 5.0 - 8.0   Glucose, UA NEGATIVE NEGATIVE mg/dL   Hgb urine dipstick NEGATIVE NEGATIVE   Bilirubin Urine NEGATIVE NEGATIVE   Ketones, ur NEGATIVE NEGATIVE mg/dL   Protein, ur NEGATIVE NEGATIVE mg/dL   Nitrite NEGATIVE NEGATIVE   Leukocytes,Ua NEGATIVE NEGATIVE    Comment: Performed at [facility], [address] [place], [place] [address]    CT HEAD WO CONTRAST Result Date: 05/21/2024 EXAM: CT HEAD AND CERVICAL SPINE 05/21/2024 07:46:47 AM TECHNIQUE: CT of the head and cervical spine was performed without the administration of intravenous contrast. Multiplanar reformatted images are provided for review. Automated exposure control, iterative reconstruction, and/or weight based adjustment of the mA/kV was utilized to reduce the radiation dose to as low as reasonably achievable. COMPARISON: CT head and cervical spine 04/14/2024. MRI cervical spine 04/15/2024. CLINICAL HISTORY: Head trauma, moderate-severe. PT brought in by EMS for a fall at home. PT is complaining of right hip/leg pain. PT also hit his head on back right side on fall and is taking blood thinners elquis. PT is alert and talking GCS 15. PT states no LOC and was given pain meds en route. PT placed in [place] per md upon arrival to ed. PT VSS. Level 2 fall on thinners. FINDINGS: CT HEAD BRAIN AND VENTRICLES: No acute intracranial hemorrhage. No mass effect or midline shift. No abnormal extra-axial fluid collection. Gray-white differentiation is maintained. No hydrocephalus. Patchy hypodensities in the cerebral white matter bilaterally, similar to the prior CT and nonspecific but compatible with moderate chronic small vessel ischemic disease. Chronic lacunar infarct at the posterior aspect of the right putamen. Mild cerebral atrophy.  ORBITS: Bilateral cataract extraction. SINUSES AND MASTOIDS: Unchanged posterior left ethmoid air cell opacification. Clear mastoid air cells. SOFT TISSUES AND SKULL: No acute skull fracture. No acute soft tissue abnormality. CT CERVICAL SPINE BONES AND ALIGNMENT: No acute fracture. Unchanged trace retrolisthesis of C3 on C4. DEGENERATIVE CHANGES: Similar appearance of multilevel disc degeneration compared to the recent prior studies with spinal stenosis from C3-4 through C5-6, including a moderately large central disc protrusion at C4-5 resulting in moderate spinal stenosis and a broad-based posterior disc osteophyte complex at C3-4 resulting in severe right greater than left neural foraminal stenosis. SOFT TISSUES: No prevertebral soft tissue swelling. VASCULATURE: Mild-to-moderate atherosclerotic calcification about the carotid bifurcations. IMPRESSION: 1. No acute intracranial abnormality. 2. No acute fracture or traumatic malalignment of the cervical spine. Electronically signed by: Dasie Hamburg MD 05/21/2024 08:38 AM EDT RP Workstation: [id number]   CT CERVICAL SPINE WO CONTRAST Result Date: 05/21/2024 EXAM: CT HEAD AND CERVICAL SPINE 05/21/2024 07:46:47 AM TECHNIQUE: CT of the head and cervical spine was performed without the administration of intravenous contrast. Multiplanar reformatted images are provided for review. Automated exposure control, iterative reconstruction, and/or weight based adjustment of the mA/kV was utilized to reduce the radiation dose to as low as reasonably achievable. COMPARISON: CT head and cervical spine 04/14/2024. MRI cervical spine 04/15/2024. CLINICAL HISTORY: Head trauma, moderate-severe. PT brought in by EMS for a fall at home. PT is complaining of right hip/leg pain. PT also hit his head on back right side on fall and is taking blood thinners elquis. PT is alert and  talking GCS 15. PT states no LOC and was given pain meds en route. PT placed in [place] per md upon arrival  to ed. PT VSS. Level 2 fall on thinners. FINDINGS: CT HEAD BRAIN AND VENTRICLES: No acute intracranial hemorrhage. No mass effect or midline shift. No abnormal extra-axial fluid collection. Gray-white differentiation is maintained. No hydrocephalus. Patchy hypodensities in the cerebral white matter bilaterally, similar to the prior CT and nonspecific but compatible with moderate chronic small vessel ischemic disease. Chronic lacunar infarct at the posterior aspect of the right putamen. Mild cerebral atrophy. ORBITS: Bilateral cataract extraction. SINUSES AND MASTOIDS: Unchanged posterior left ethmoid air cell opacification. Clear mastoid air cells. SOFT TISSUES AND SKULL: No acute skull fracture. No acute soft tissue abnormality. CT CERVICAL SPINE BONES AND ALIGNMENT: No acute fracture. Unchanged trace retrolisthesis of C3 on C4. DEGENERATIVE CHANGES: Similar appearance of multilevel disc degeneration compared to the recent prior studies with spinal stenosis from C3-4 through C5-6, including a moderately large central disc protrusion at C4-5 resulting in moderate spinal stenosis and a broad-based posterior disc osteophyte complex at C3-4 resulting in severe right greater than left neural foraminal stenosis. SOFT TISSUES: No prevertebral soft tissue swelling. VASCULATURE: Mild-to-moderate atherosclerotic calcification about the carotid bifurcations. IMPRESSION: 1. No acute intracranial abnormality. 2. No acute fracture or traumatic malalignment of the cervical spine. Electronically signed by: Dasie Hamburg MD 05/21/2024 08:38 AM EDT RP Workstation: [id number]   DG Femur Min 2 Views Right Result Date: 05/21/2024 EXAM: 2 VIEW(S) XRAY OF THE RIGHT FEMUR 05/21/2024 08:30:00 AM COMPARISON: None available. CLINICAL HISTORY: Fracture. Reason for exam: fall; Best images obtainable due to patient condition and images approved by bedside physician. FINDINGS: BONES AND JOINTS: There is a mildly displaced femoral neck fracture.  There is mild osteoarthritis. SOFT TISSUES: There are vascular calcifications. IMPRESSION: 1. Right femoral neck fracture, mildly displaced. 2. Mild osteoarthritis. Electronically signed by: evalene coho 05/21/2024 08:35 AM EDT RP Workstation: [id number]   CT CHEST ABDOMEN PELVIS WO CONTRAST Result Date: 05/21/2024 CLINICAL DATA:  Patient fell at home.  Right hip and leg pain EXAM: CT CHEST, ABDOMEN AND PELVIS WITHOUT CONTRAST TECHNIQUE: Multidetector CT imaging of the chest, abdomen and pelvis was performed following the standard protocol without IV contrast. RADIATION DOSE REDUCTION: This exam was performed according to the departmental dose-optimization program which includes automated exposure control, adjustment of the mA and/or kV according to patient size and/or use of iterative reconstruction technique. COMPARISON:  04/15/2024 FINDINGS: CT CHEST FINDINGS Cardiovascular: The heart size is normal. No substantial pericardial effusion. Coronary artery calcification is evident. Mild atherosclerotic calcification is noted in the wall of the thoracic aorta. Mediastinum/Nodes: No mediastinal lymphadenopathy. No evidence for gross hilar lymphadenopathy although assessment is limited by the lack of intravenous contrast on the current study. The esophagus has normal imaging features. There is no axillary lymphadenopathy. Lungs/Pleura: Multiple tiny right lung nodules are stable comparing back to a study from 05/24/2023, measuring up to about 3 mm in the right middle lobe (100/5) interval stability compatible with benign etiology. No followup imaging is recommended. Dependent atelectasis noted in both lower lobes. Small right pleural effusion slightly increased since 04/15/2024. Musculoskeletal: See report for dedicated thoracic spine CT dictated separately. No worrisome lytic or sclerotic osseous abnormality. CT ABDOMEN PELVIS FINDINGS Hepatobiliary: No suspicious focal abnormality in the liver on this study  without intravenous contrast. Subtle nodularity of liver contour suggests cirrhosis in apparent recanalization of the paraumbilical vein is suspicious for portal venous hypertension. Calcified gallstones measure  up to 9 mm. No intrahepatic or extrahepatic biliary dilation. Pancreas: No focal mass lesion. No dilatation of the main duct. No intraparenchymal cyst. No peripancreatic edema. Spleen: No splenomegaly. No suspicious focal mass lesion. Adrenals/Urinary Tract: No adrenal nodule or mass. Kidneys unremarkable. No evidence for hydroureter. The urinary bladder appears normal for the degree of distention. Stomach/Bowel: Stomach is unremarkable. No gastric wall thickening. No evidence of outlet obstruction. There is some subtle Peri duodenal stranding, mainly around the descending segment in similar to prior study. No small bowel wall thickening. No small bowel dilatation. The terminal ileum is normal. The appendix is not well visualized, but there is no edema or inflammation in the region of the cecal tip to suggest appendicitis. No gross colonic mass. No colonic wall thickening. Vascular/Lymphatic: There is moderate atherosclerotic calcification of the abdominal aorta without aneurysm. Increased number of lymph nodes are seen in the central small bowel mesentery, similar to prior in some of these lymph nodes are upper normal to borderline enlarged, stable. No pelvic sidewall lymphadenopathy. Reproductive: The prostate gland and seminal vesicles are unremarkable. Other: No intraperitoneal free fluid. Haziness in the retroperitoneal tissues of the abdomen in central small bowel mesentery is similar. Musculoskeletal: No worrisome lytic or sclerotic osseous abnormality. Acute right femoral neck fracture is in the subcapital to transcervical location with mild comminution and varus angulation. See report for dedicated lumbar spine CT dictated separately. IMPRESSION: 1. Acute right femoral neck fracture in the  subcapital to transcervical location with mild comminution and varus angulation. 2. No other acute traumatic injury in the chest, abdomen, or pelvis. See report for dedicated thoracolumbar spine CT dictated separately. 3. Small right pleural effusion, slightly increased since 04/15/2024. 4. Cirrhotic liver morphology. 5. Cholelithiasis. 6. Stable haziness in the retroperitoneal tissues of the abdomen and central small bowel mesentery with increased number of lymph nodes in the central small bowel mesentery, some of which are upper normal to borderline enlarged. Findings are stable since prior. Imaging features are nonspecific but can be seen in the setting of mesenteric adenitis or panniculitis. 7.  Aortic Atherosclerosis (ICD10-I70.0). Electronically Signed   By: Camellia Candle M.D.   On: 05/21/2024 08:08   CT T-SPINE NO CHARGE Result Date: 05/21/2024 EXAM: CT THORACIC SPINE WITHOUT CONTRAST 05/21/2024 07:46:47 AM TECHNIQUE: CT of the thoracic spine was performed without the administration of intravenous contrast. Multiplanar reformatted images are provided for review. Automated exposure control, iterative reconstruction, and/or weight based adjustment of the mA/kV was utilized to reduce the radiation dose to as low as reasonably achievable. COMPARISON: CT of the thoracic spine dated 05/24/2023. CLINICAL HISTORY: Mid-back pain. Triage notes; PT brought in by EMS for a fall at home. PT is complaining of right hip/leg pain. PT also hit his head on back right side on fall and is taking blood thinners elquis. PT is alert and talking GCS 15. PT states no LOC and was given pain meds en route see ; charting. PT placed in [place] per md upon arrival to ed. PT VSS ; Level 2 fall on thinners FINDINGS: BONES AND ALIGNMENT: Normal vertebral body heights. No acute fracture or suspicious bone lesion. Normal alignment. DEGENERATIVE CHANGES: Mild chronic degenerative disc disease throughout the mid and lower thoracic spine. SOFT  TISSUES: Mild right-sided pleural effusion. IMPRESSION: 1. No acute abnormality of the thoracic spine related to the reported fall. 2. Mild chronic degenerative disc disease throughout the mid and lower thoracic spine. 3. Mild right-sided pleural effusion. Electronically signed by: evalene coho  05/21/2024 08:05 AM EDT RP Workstation: [id number]   CT L-SPINE NO CHARGE Result Date: 05/21/2024 EXAM: CT OF THE LUMBAR SPINE WITHOUT CONTRAST 05/21/2024 07:46:47 AM TECHNIQUE: CT of the lumbar spine was performed without the administration of intravenous contrast. Multiplanar reformatted images are provided for review. Automated exposure control, iterative reconstruction, and/or weight based adjustment of the mA/kV was utilized to reduce the radiation dose to as low as reasonably achievable. COMPARISON: CT of the lumbar spine dated 04/15/2024. CLINICAL HISTORY: PT brought in by EMS for a fall at home. PT is complaining of right hip/leg pain. PT also hit his head on back right side on fall and is taking blood thinners elquis. PT is alert and talking GCS 15. PT states no LOC and was given pain meds en route see charting. PT placed in [place] per md upon arrival to ed. PT VSS. Level 2 fall on thinners. FINDINGS: BONES AND ALIGNMENT: There is a mild rotatory levocurvature of the lumbar spine. There is a chronic superior endplate compression deformity of L1. DEGENERATIVE CHANGES: There is moderate chronic degenerative disc disease at L2-3, with posterior disc bulging and endplate ridging. There is also chronic degenerative disc disease at L4-5 with mild central spinal canal stenosis. SOFT TISSUES: There is a mild right-sided pleural effusion. IMPRESSION: 1. No acute traumatic injury. 2. Chronic superior endplate compression deformity of L1. 3. Moderate chronic degenerative disc disease at L2-3 with posterior disc bulging and endplate ridging. 4. Chronic degenerative disc disease at L4-5 with mild central spinal canal  stenosis. Electronically signed by: evalene coho 05/21/2024 08:03 AM EDT RP Workstation: [id number]   DG Pelvis Portable Result Date: 05/21/2024 EXAM: 1 or 2 VIEW(S) XRAY OF THE PELVIS 05/21/2024 07:15:00 AM COMPARISON: 05/24/2023 CLINICAL HISTORY: Trauma. Pt lost balance and fell on right hip then fell backwards and hit back of head. FINDINGS: BONES AND JOINTS: There is a deformity of the right hip compatible with acute subcapital femoral neck fracture with mild superior displacement and impaction of the fracture fragments. No additional fracture or dislocation. Degenerative changes in the lumbar spine and left hip. SOFT TISSUES: Atherosclerotic calcifications. IMPRESSION: 1. Acute subcapital femoral neck fracture of the right hip with mild superior displacement and impaction of the fracture fragments. 2. No additional fracture or dislocation. Electronically signed by: Waddell Calk MD 05/21/2024 07:45 AM EDT RP Workstation: [id number]   DG Chest Port 1 View Result Date: 05/21/2024 EXAM: 1 VIEW XRAY OF THE CHEST 05/21/2024 07:15:00 AM COMPARISON: 05/21/2024 CLINICAL HISTORY: Trauma. Pt lost balance and fell on right hip then fell backwards and hit back of head. FINDINGS: LUNGS AND PLEURA: Mild scar versus subsegmental atelectasis noted within the left base. Trace right pleural fluid with blunting of the right costophrenic angle. No pneumothorax identified. HEART AND MEDIASTINUM: Normal cardiac and mediastinal silhouette. BONES AND SOFT TISSUES: No acute osseous abnormality. IMPRESSION: 1. Mild scar versus subsegmental atelectasis in the left base. 2. Trace right pleural fluid with blunting of the right costophrenic angle. Electronically signed by: Waddell Calk MD 05/21/2024 07:43 AM EDT RP Workstation: [id number]    Review of Systems  HENT:  Negative for ear discharge, ear pain, hearing loss and tinnitus.   Eyes:  Negative for photophobia and pain.  Respiratory:  Negative for cough and shortness of  breath.   Cardiovascular:  Negative for chest pain.  Gastrointestinal:  Negative for abdominal pain, nausea and vomiting.  Genitourinary:  Negative for dysuria, flank pain, frequency and urgency.  Musculoskeletal:  Positive for arthralgias (Right hip).  Negative for back pain, myalgias and neck pain.  Neurological:  Negative for dizziness and headaches.  Hematological:  Does not bruise/bleed easily.  Psychiatric/Behavioral:  The patient is not nervous/anxious.    Blood pressure (!) 148/92, pulse 86, temperature 98.2 F (36.8 C), resp. rate 20, height 5' 9 (1.753 m), weight 77.1 kg, SpO2 97%. Physical Exam Constitutional:      General: He is not in acute distress.    Appearance: He is well-developed. He is not diaphoretic.  HENT:     Head: Normocephalic and atraumatic.  Eyes:     General: No scleral icterus.       Right eye: No discharge.        Left eye: No discharge.     Conjunctiva/sclera: Conjunctivae normal.  Cardiovascular:     Rate and Rhythm: Normal rate and regular rhythm.  Pulmonary:     Effort: Pulmonary effort is normal. No respiratory distress.  Musculoskeletal:     Cervical back: Normal range of motion.     Comments: RLE No traumatic wounds, ecchymosis, or rash  Severe TTP hip  No knee or ankle effusion  Knee stable to varus/ valgus and anterior/posterior stress  Sens DPN, SPN, TN intact  Motor EHL, ext, flex, evers 5/5  DP 2+, PT 2+, No significant edema  Skin:    General: Skin is warm and dry.  Neurological:     Mental Status: He is alert.  Psychiatric:        Mood and Affect: Mood normal.        Behavior: Behavior normal.     Assessment/Plan: Right hip fx -- Plan THA today with Dr. Kendal. Please keep NPO.    Ozell DOROTHA Ned, PA-C Orthopedic Surgery [phone] 05/21/2024, 9:04 AM

## 2024-05-21 NOTE — Interval H&P Note (Signed)
 History and Physical Interval Note:  05/21/2024 3:14 PM  Jeffrey Randolph  has presented today for surgery, with the diagnosis of RIGHT HIP FRACTURE.  The various methods of treatment have been discussed with the patient and family. After consideration of risks, benefits and other options for treatment, the patient has consented to  Procedure(s): ARTHROPLASTY, HIP, TOTAL, ANTERIOR APPROACH (Right) as a surgical intervention.  The patient's history has been reviewed, patient examined, no change in status, stable for surgery.  I have reviewed the patient's chart and labs.  Questions were answered to the patient's satisfaction.     Twilla Khouri P Mickaela Starlin

## 2024-05-21 NOTE — Anesthesia Procedure Notes (Signed)
 Procedure Name: Intubation Date/Time: 05/21/2024 4:19 PM  Performed by: Marva Lonni PARAS, CRNAPre-anesthesia Checklist: Patient identified, Emergency Drugs available, Suction available and Patient being monitored Patient Re-evaluated:Patient Re-evaluated prior to induction Oxygen Delivery Method: Circle System Utilized Preoxygenation: Pre-oxygenation with 100% oxygen Induction Type: IV induction Ventilation: Mask ventilation without difficulty Laryngoscope Size: Mac and 3 Grade View: Grade I Tube type: Oral Tube size: 7.5 mm Number of attempts: 1 Airway Equipment and Method: Stylet and Oral airway Placement Confirmation: ETT inserted through vocal cords under direct vision, positive ETCO2 and breath sounds checked- equal and bilateral Secured at: 21 cm Tube secured with: Tape Dental Injury: Teeth and Oropharynx as per pre-operative assessment

## 2024-05-21 NOTE — TOC CAGE-AID Note (Signed)
 Transition of Care [facility]) - CAGE-AID Screening  Patient Details  Name: Jeffrey Randolph MRN: [id number] Date of Birth: 10-May-1956  Clinical Narrative:  Patient to ED after a mechanical fall. Patient denies any current alcohol, drug or cigarette use. Patient does not need substance abuse resources at this time.  CAGE-AID Screening:   Have You Ever Felt You Ought to Cut Down on Your Drinking or Drug Use?: No Have People Annoyed You By Critizing Your Drinking Or Drug Use?: No Have You Felt Bad Or Guilty About Your Drinking Or Drug Use?: No Have You Ever Had a Drink or Used Drugs First Thing In The Morning to Steady Your Nerves or to Get Rid of a Hangover?: No CAGE-AID Score: 0

## 2024-05-21 NOTE — ED Triage Notes (Signed)
 PT brought in by EMS for a fall at home. PT is complaining of right hip/leg pain. PT also hit his head on back right side on fall and is taking blood thinners elquis. PT is alert and talking GCS 15. PT states no LOC and was given pain meds en route see charting. PT placed in Ccollar per md upon arrival to ed. PT VSS

## 2024-05-21 NOTE — Anesthesia Postprocedure Evaluation (Signed)
 Anesthesia Post Note  Patient: Sheila Gervasi  Procedure(s) Performed: ARTHROPLASTY, HIP, TOTAL, ANTERIOR APPROACH (Right: Hip)     Patient location during evaluation: PACU Anesthesia Type: General Level of consciousness: awake and alert Pain management: pain level controlled Vital Signs Assessment: post-procedure vital signs reviewed and stable Respiratory status: spontaneous breathing, nonlabored ventilation, respiratory function stable and patient connected to nasal cannula oxygen Cardiovascular status: blood pressure returned to baseline and stable Postop Assessment: no apparent nausea or vomiting Anesthetic complications: no   No notable events documented.  Last Vitals:  Vitals:   05/21/24 2000 05/21/24 2029  BP: 128/65 (!) 115/49  Pulse: (!) 106 100  Resp: 11 18  Temp: 36.7 C 36.8 C  SpO2: 95% (!) 88%    Last Pain:  Vitals:   05/21/24 2029  TempSrc: Oral  PainSc:                  Jashon Ishida E

## 2024-05-21 NOTE — H&P (View-Only) (Signed)
 Reason for Consult:Right hip fx Referring Physician: Caron Salt Time called: [id number] Time at bedside: 0855   Jeffrey Randolph is an 68 y.o. male.  HPI: Jeffrey Randolph lost his balance this morning and fell backwards. He had immediate right hip pain and could not get up. He was brought to the ED where x-rays showed a right hip fx and orthopedic surgery was consulted. He c/o localized pain to the hip. He lives alone and does not use any assistive devices to ambulate. He is officially on Eliquis  but hasn't taken it in several days because he doesn't like all the bruising.  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    Past Surgical History:  Procedure Laterality Date   ESOPHAGOGASTRODUODENOSCOPY (EGD) WITH PROPOFOL  N/A 01/06/2023   Procedure: ESOPHAGOGASTRODUODENOSCOPY (EGD) WITH PROPOFOL ;  Surgeon: [name], MD;  Location: [facility] ENDOSCOPY;  Service: Gastroenterology;  Laterality: N/A;  Wants close to 9 AM arrival   LUMBAR LAMINECTOMY/DECOMPRESSION MICRODISCECTOMY Right 01/09/2015   Procedure: LUMBAR LAMINECTOMY/DECOMPRESSION MICRODISCECTOMY 1 LEVEL;  Surgeon: [name], MD;  Location: [facility] NEURO ORS;  Service: Neurosurgery;  Laterality: Right;  LUMBAR LAMINECTOMY/DECOMPRESSION MICRODISCECTOMY 1 LEVEL RIGHT LUMBAR 2-3   TONSILLECTOMY      Family History  Problem Relation Age of Onset   Pancreatic cancer Mother     Social History:  reports that he has quit  smoking. His smoking use included cigarettes. He has never used smokeless tobacco. He reports that he does not currently use alcohol. He reports that he does not use drugs.  Allergies: No Known Allergies  Medications: I have reviewed the patient's current medications.  Results for orders placed or performed during the hospital encounter of 05/21/24 (from the past 48 hours)  Comprehensive metabolic panel     Status: Abnormal   Collection Time: 05/21/24  6:57 AM  Result Value Ref Range   Sodium 121 (L) 135 - 145 mmol/L   Potassium 3.4 (L) 3.5 - 5.1 mmol/L   Chloride 92 (L) 98 - 111 mmol/L   CO2 17 (L) 22 - 32 mmol/L   Glucose, Bld 126 (H) 70 - 99 mg/dL    Comment: Glucose reference range applies only to samples taken after fasting for at least 8 hours.   BUN <5 (L) 8 - 23 mg/dL   Creatinine, Ser 9.48 (L) 0.61 - 1.24 mg/dL   Calcium 8.2 (L) 8.9 - 10.3 mg/dL   Total Protein 6.7 6.5 - 8.1 g/dL   Albumin  3.3 (L) 3.5 - 5.0 g/dL   AST 45 (H) 15 - 41 U/L   ALT 23 0 - 44 U/L   Alkaline Phosphatase 127 (H) 38 - 126 U/L   Total Bilirubin 1.5 (H) 0.0 - 1.2 mg/dL   GFR, Estimated >39 >39 mL/min    Comment: (NOTE) Calculated using the CKD-EPI Creatinine Equation (2021)    Anion gap  12 5 - 15    Comment: Performed at [facility], [address] [place], [place] [address]  CBC     Status: Abnormal   Collection Time: 05/21/24  6:57 AM  Result Value Ref Range   WBC 5.6 4.0 - 10.5 K/uL   RBC 3.10 (L) 4.22 - 5.81 MIL/uL   Hemoglobin 11.3 (L) 13.0 - 17.0 g/dL   HCT 67.9 (L) 60.9 - 47.9 %   MCV 103.2 (H) 80.0 - 100.0 fL   MCH 36.5 (H) 26.0 - 34.0 pg   MCHC 35.3 30.0 - 36.0 g/dL   RDW 81.0 (H) 88.4 - 84.4 %   Platelets 179 150 - 400 K/uL   nRBC 0.7 (H) 0.0 - 0.2 %    Comment: Performed at [facility], [address], [place], [place] [address]  Ethanol     Status: Abnormal   Collection Time: 05/21/24  6:57 AM  Result Value Ref Range   Alcohol, Ethyl (B) 38 (H) <15 mg/dL     Comment: (NOTE) For medical purposes only. Performed at [facility], [address] [place], [place] [address]   Protime-INR     Status: Abnormal   Collection Time: 05/21/24  6:57 AM  Result Value Ref Range   Prothrombin Time 17.2 (H) 11.4 - 15.2 seconds   INR 1.3 (H) 0.8 - 1.2    Comment: (NOTE) INR goal varies based on device and disease states. Performed at [facility], [address], [place], [place] [address]   Type and screen [facility]     Status: None   Collection Time: 05/21/24  6:57 AM  Result Value Ref Range   ABO/RH(D) O POS    Antibody Screen NEG    Sample Expiration      05/24/2024,2359 Performed at [facility], [address], [place], [place] [address]   APTT     Status: None   Collection Time: 05/21/24  6:57 AM  Result Value Ref Range   aPTT 36 24 - 36 seconds    Comment: Performed at [facility], [address], [place], [place] [address]  ABO/Rh     Status: None   Collection Time: 05/21/24  7:05 AM  Result Value Ref Range   ABO/RH(D)      O POS Performed at [facility], [address], [place], [place] [address]   I-Stat Chem 8, ED     Status: Abnormal   Collection Time: 05/21/24  7:12 AM  Result Value Ref Range   Sodium 122 (L) 135 - 145 mmol/L   Potassium 3.4 (L) 3.5 - 5.1 mmol/L   Chloride 88 (L) 98 - 111 mmol/L   BUN <3 (L) 8 - 23 mg/dL   Creatinine, Ser 9.49 (L) 0.61 - 1.24 mg/dL   Glucose, Bld 875 (H) 70 - 99 mg/dL    Comment: Glucose reference range applies only to samples taken after fasting for at least 8 hours.   Calcium, Ion 1.03 (L) 1.15 - 1.40 mmol/L   TCO2 19 (L) 22 - 32 mmol/L   Hemoglobin 11.9 (L) 13.0 - 17.0 g/dL   HCT 64.9 (L) 60.9 - 47.9 %  I-Stat Lactic Acid, ED     Status: Abnormal   Collection Time: 05/21/24  7:12 AM  Result Value Ref Range   Lactic Acid, Venous 4.0 (HH) 0.5 - 1.9 mmol/L   Comment NOTIFIED PHYSICIAN   Urinalysis,  Routine w reflex microscopic -Urine,  Clean Catch     Status: None   Collection Time: 05/21/24  7:42 AM  Result Value Ref Range   Color, Urine YELLOW YELLOW   APPearance CLEAR CLEAR   Specific Gravity, Urine 1.005 1.005 - 1.030   pH 5.0 5.0 - 8.0   Glucose, UA NEGATIVE NEGATIVE mg/dL   Hgb urine dipstick NEGATIVE NEGATIVE   Bilirubin Urine NEGATIVE NEGATIVE   Ketones, ur NEGATIVE NEGATIVE mg/dL   Protein, ur NEGATIVE NEGATIVE mg/dL   Nitrite NEGATIVE NEGATIVE   Leukocytes,Ua NEGATIVE NEGATIVE    Comment: Performed at [facility], [address], [place], [place] [address]    CT HEAD WO CONTRAST Result Date: 05/21/2024 EXAM: CT HEAD AND CERVICAL SPINE 05/21/2024 07:46:47 AM TECHNIQUE: CT of the head and cervical spine was performed without the administration of intravenous contrast. Multiplanar reformatted images are provided for review. Automated exposure control, iterative reconstruction, and/or weight based adjustment of the mA/kV was utilized to reduce the radiation dose to as low as reasonably achievable. COMPARISON: CT head and cervical spine 04/14/2024. MRI cervical spine 04/15/2024. CLINICAL HISTORY: Head trauma, moderate-severe. PT brought in by EMS for a fall at home. PT is complaining of right hip/leg pain. PT also hit his head on back right side on fall and is taking blood thinners elquis. PT is alert and talking GCS 15. PT states no LOC and was given pain meds en route. PT placed in [place] per md upon arrival to ed. PT VSS. Level 2 fall on thinners. FINDINGS: CT HEAD BRAIN AND VENTRICLES: No acute intracranial hemorrhage. No mass effect or midline shift. No abnormal extra-axial fluid collection. Gray-white differentiation is maintained. No hydrocephalus. Patchy hypodensities in the cerebral white matter bilaterally, similar to the prior CT and nonspecific but compatible with moderate chronic small vessel ischemic disease. Chronic lacunar infarct at the posterior aspect of the right putamen. Mild cerebral atrophy.  ORBITS: Bilateral cataract extraction. SINUSES AND MASTOIDS: Unchanged posterior left ethmoid air cell opacification. Clear mastoid air cells. SOFT TISSUES AND SKULL: No acute skull fracture. No acute soft tissue abnormality. CT CERVICAL SPINE BONES AND ALIGNMENT: No acute fracture. Unchanged trace retrolisthesis of C3 on C4. DEGENERATIVE CHANGES: Similar appearance of multilevel disc degeneration compared to the recent prior studies with spinal stenosis from C3-4 through C5-6, including a moderately large central disc protrusion at C4-5 resulting in moderate spinal stenosis and a broad-based posterior disc osteophyte complex at C3-4 resulting in severe right greater than left neural foraminal stenosis. SOFT TISSUES: No prevertebral soft tissue swelling. VASCULATURE: Mild-to-moderate atherosclerotic calcification about the carotid bifurcations. IMPRESSION: 1. No acute intracranial abnormality. 2. No acute fracture or traumatic malalignment of the cervical spine. Electronically signed by: Dasie Hamburg MD 05/21/2024 08:38 AM EDT RP Workstation: [id number]   CT CERVICAL SPINE WO CONTRAST Result Date: 05/21/2024 EXAM: CT HEAD AND CERVICAL SPINE 05/21/2024 07:46:47 AM TECHNIQUE: CT of the head and cervical spine was performed without the administration of intravenous contrast. Multiplanar reformatted images are provided for review. Automated exposure control, iterative reconstruction, and/or weight based adjustment of the mA/kV was utilized to reduce the radiation dose to as low as reasonably achievable. COMPARISON: CT head and cervical spine 04/14/2024. MRI cervical spine 04/15/2024. CLINICAL HISTORY: Head trauma, moderate-severe. PT brought in by EMS for a fall at home. PT is complaining of right hip/leg pain. PT also hit his head on back right side on fall and is taking blood thinners elquis. PT is alert and  talking GCS 15. PT states no LOC and was given pain meds en route. PT placed in [place] per md upon arrival  to ed. PT VSS. Level 2 fall on thinners. FINDINGS: CT HEAD BRAIN AND VENTRICLES: No acute intracranial hemorrhage. No mass effect or midline shift. No abnormal extra-axial fluid collection. Gray-white differentiation is maintained. No hydrocephalus. Patchy hypodensities in the cerebral white matter bilaterally, similar to the prior CT and nonspecific but compatible with moderate chronic small vessel ischemic disease. Chronic lacunar infarct at the posterior aspect of the right putamen. Mild cerebral atrophy. ORBITS: Bilateral cataract extraction. SINUSES AND MASTOIDS: Unchanged posterior left ethmoid air cell opacification. Clear mastoid air cells. SOFT TISSUES AND SKULL: No acute skull fracture. No acute soft tissue abnormality. CT CERVICAL SPINE BONES AND ALIGNMENT: No acute fracture. Unchanged trace retrolisthesis of C3 on C4. DEGENERATIVE CHANGES: Similar appearance of multilevel disc degeneration compared to the recent prior studies with spinal stenosis from C3-4 through C5-6, including a moderately large central disc protrusion at C4-5 resulting in moderate spinal stenosis and a broad-based posterior disc osteophyte complex at C3-4 resulting in severe right greater than left neural foraminal stenosis. SOFT TISSUES: No prevertebral soft tissue swelling. VASCULATURE: Mild-to-moderate atherosclerotic calcification about the carotid bifurcations. IMPRESSION: 1. No acute intracranial abnormality. 2. No acute fracture or traumatic malalignment of the cervical spine. Electronically signed by: Dasie Hamburg MD 05/21/2024 08:38 AM EDT RP Workstation: [id number]   DG Femur Min 2 Views Right Result Date: 05/21/2024 EXAM: 2 VIEW(S) XRAY OF THE RIGHT FEMUR 05/21/2024 08:30:00 AM COMPARISON: None available. CLINICAL HISTORY: Fracture. Reason for exam: fall; Best images obtainable due to patient condition and images approved by bedside physician. FINDINGS: BONES AND JOINTS: There is a mildly displaced femoral neck fracture.  There is mild osteoarthritis. SOFT TISSUES: There are vascular calcifications. IMPRESSION: 1. Right femoral neck fracture, mildly displaced. 2. Mild osteoarthritis. Electronically signed by: evalene coho 05/21/2024 08:35 AM EDT RP Workstation: [id number]   CT CHEST ABDOMEN PELVIS WO CONTRAST Result Date: 05/21/2024 CLINICAL DATA:  Patient fell at home.  Right hip and leg pain EXAM: CT CHEST, ABDOMEN AND PELVIS WITHOUT CONTRAST TECHNIQUE: Multidetector CT imaging of the chest, abdomen and pelvis was performed following the standard protocol without IV contrast. RADIATION DOSE REDUCTION: This exam was performed according to the departmental dose-optimization program which includes automated exposure control, adjustment of the mA and/or kV according to patient size and/or use of iterative reconstruction technique. COMPARISON:  04/15/2024 FINDINGS: CT CHEST FINDINGS Cardiovascular: The heart size is normal. No substantial pericardial effusion. Coronary artery calcification is evident. Mild atherosclerotic calcification is noted in the wall of the thoracic aorta. Mediastinum/Nodes: No mediastinal lymphadenopathy. No evidence for gross hilar lymphadenopathy although assessment is limited by the lack of intravenous contrast on the current study. The esophagus has normal imaging features. There is no axillary lymphadenopathy. Lungs/Pleura: Multiple tiny right lung nodules are stable comparing back to a study from 05/24/2023, measuring up to about 3 mm in the right middle lobe (100/5) interval stability compatible with benign etiology. No followup imaging is recommended. Dependent atelectasis noted in both lower lobes. Small right pleural effusion slightly increased since 04/15/2024. Musculoskeletal: See report for dedicated thoracic spine CT dictated separately. No worrisome lytic or sclerotic osseous abnormality. CT ABDOMEN PELVIS FINDINGS Hepatobiliary: No suspicious focal abnormality in the liver on this study  without intravenous contrast. Subtle nodularity of liver contour suggests cirrhosis in apparent recanalization of the paraumbilical vein is suspicious for portal venous hypertension. Calcified gallstones measure  up to 9 mm. No intrahepatic or extrahepatic biliary dilation. Pancreas: No focal mass lesion. No dilatation of the main duct. No intraparenchymal cyst. No peripancreatic edema. Spleen: No splenomegaly. No suspicious focal mass lesion. Adrenals/Urinary Tract: No adrenal nodule or mass. Kidneys unremarkable. No evidence for hydroureter. The urinary bladder appears normal for the degree of distention. Stomach/Bowel: Stomach is unremarkable. No gastric wall thickening. No evidence of outlet obstruction. There is some subtle Peri duodenal stranding, mainly around the descending segment in similar to prior study. No small bowel wall thickening. No small bowel dilatation. The terminal ileum is normal. The appendix is not well visualized, but there is no edema or inflammation in the region of the cecal tip to suggest appendicitis. No gross colonic mass. No colonic wall thickening. Vascular/Lymphatic: There is moderate atherosclerotic calcification of the abdominal aorta without aneurysm. Increased number of lymph nodes are seen in the central small bowel mesentery, similar to prior in some of these lymph nodes are upper normal to borderline enlarged, stable. No pelvic sidewall lymphadenopathy. Reproductive: The prostate gland and seminal vesicles are unremarkable. Other: No intraperitoneal free fluid. Haziness in the retroperitoneal tissues of the abdomen in central small bowel mesentery is similar. Musculoskeletal: No worrisome lytic or sclerotic osseous abnormality. Acute right femoral neck fracture is in the subcapital to transcervical location with mild comminution and varus angulation. See report for dedicated lumbar spine CT dictated separately. IMPRESSION: 1. Acute right femoral neck fracture in the  subcapital to transcervical location with mild comminution and varus angulation. 2. No other acute traumatic injury in the chest, abdomen, or pelvis. See report for dedicated thoracolumbar spine CT dictated separately. 3. Small right pleural effusion, slightly increased since 04/15/2024. 4. Cirrhotic liver morphology. 5. Cholelithiasis. 6. Stable haziness in the retroperitoneal tissues of the abdomen and central small bowel mesentery with increased number of lymph nodes in the central small bowel mesentery, some of which are upper normal to borderline enlarged. Findings are stable since prior. Imaging features are nonspecific but can be seen in the setting of mesenteric adenitis or panniculitis. 7.  Aortic Atherosclerosis (ICD10-I70.0). Electronically Signed   By: Camellia Candle M.D.   On: 05/21/2024 08:08   CT T-SPINE NO CHARGE Result Date: 05/21/2024 EXAM: CT THORACIC SPINE WITHOUT CONTRAST 05/21/2024 07:46:47 AM TECHNIQUE: CT of the thoracic spine was performed without the administration of intravenous contrast. Multiplanar reformatted images are provided for review. Automated exposure control, iterative reconstruction, and/or weight based adjustment of the mA/kV was utilized to reduce the radiation dose to as low as reasonably achievable. COMPARISON: CT of the thoracic spine dated 05/24/2023. CLINICAL HISTORY: Mid-back pain. Triage notes; PT brought in by EMS for a fall at home. PT is complaining of right hip/leg pain. PT also hit his head on back right side on fall and is taking blood thinners elquis. PT is alert and talking GCS 15. PT states no LOC and was given pain meds en route see ; charting. PT placed in [place] per md upon arrival to ed. PT VSS ; Level 2 fall on thinners FINDINGS: BONES AND ALIGNMENT: Normal vertebral body heights. No acute fracture or suspicious bone lesion. Normal alignment. DEGENERATIVE CHANGES: Mild chronic degenerative disc disease throughout the mid and lower thoracic spine. SOFT  TISSUES: Mild right-sided pleural effusion. IMPRESSION: 1. No acute abnormality of the thoracic spine related to the reported fall. 2. Mild chronic degenerative disc disease throughout the mid and lower thoracic spine. 3. Mild right-sided pleural effusion. Electronically signed by: evalene coho  05/21/2024 08:05 AM EDT RP Workstation: [id number]   CT L-SPINE NO CHARGE Result Date: 05/21/2024 EXAM: CT OF THE LUMBAR SPINE WITHOUT CONTRAST 05/21/2024 07:46:47 AM TECHNIQUE: CT of the lumbar spine was performed without the administration of intravenous contrast. Multiplanar reformatted images are provided for review. Automated exposure control, iterative reconstruction, and/or weight based adjustment of the mA/kV was utilized to reduce the radiation dose to as low as reasonably achievable. COMPARISON: CT of the lumbar spine dated 04/15/2024. CLINICAL HISTORY: PT brought in by EMS for a fall at home. PT is complaining of right hip/leg pain. PT also hit his head on back right side on fall and is taking blood thinners elquis. PT is alert and talking GCS 15. PT states no LOC and was given pain meds en route see charting. PT placed in [place] per md upon arrival to ed. PT VSS. Level 2 fall on thinners. FINDINGS: BONES AND ALIGNMENT: There is a mild rotatory levocurvature of the lumbar spine. There is a chronic superior endplate compression deformity of L1. DEGENERATIVE CHANGES: There is moderate chronic degenerative disc disease at L2-3, with posterior disc bulging and endplate ridging. There is also chronic degenerative disc disease at L4-5 with mild central spinal canal stenosis. SOFT TISSUES: There is a mild right-sided pleural effusion. IMPRESSION: 1. No acute traumatic injury. 2. Chronic superior endplate compression deformity of L1. 3. Moderate chronic degenerative disc disease at L2-3 with posterior disc bulging and endplate ridging. 4. Chronic degenerative disc disease at L4-5 with mild central spinal canal  stenosis. Electronically signed by: evalene coho 05/21/2024 08:03 AM EDT RP Workstation: [id number]   DG Pelvis Portable Result Date: 05/21/2024 EXAM: 1 or 2 VIEW(S) XRAY OF THE PELVIS 05/21/2024 07:15:00 AM COMPARISON: 05/24/2023 CLINICAL HISTORY: Trauma. Pt lost balance and fell on right hip then fell backwards and hit back of head. FINDINGS: BONES AND JOINTS: There is a deformity of the right hip compatible with acute subcapital femoral neck fracture with mild superior displacement and impaction of the fracture fragments. No additional fracture or dislocation. Degenerative changes in the lumbar spine and left hip. SOFT TISSUES: Atherosclerotic calcifications. IMPRESSION: 1. Acute subcapital femoral neck fracture of the right hip with mild superior displacement and impaction of the fracture fragments. 2. No additional fracture or dislocation. Electronically signed by: Waddell Calk MD 05/21/2024 07:45 AM EDT RP Workstation: [id number]   DG Chest Port 1 View Result Date: 05/21/2024 EXAM: 1 VIEW XRAY OF THE CHEST 05/21/2024 07:15:00 AM COMPARISON: 05/21/2024 CLINICAL HISTORY: Trauma. Pt lost balance and fell on right hip then fell backwards and hit back of head. FINDINGS: LUNGS AND PLEURA: Mild scar versus subsegmental atelectasis noted within the left base. Trace right pleural fluid with blunting of the right costophrenic angle. No pneumothorax identified. HEART AND MEDIASTINUM: Normal cardiac and mediastinal silhouette. BONES AND SOFT TISSUES: No acute osseous abnormality. IMPRESSION: 1. Mild scar versus subsegmental atelectasis in the left base. 2. Trace right pleural fluid with blunting of the right costophrenic angle. Electronically signed by: Waddell Calk MD 05/21/2024 07:43 AM EDT RP Workstation: [id number]    Review of Systems  HENT:  Negative for ear discharge, ear pain, hearing loss and tinnitus.   Eyes:  Negative for photophobia and pain.  Respiratory:  Negative for cough and shortness of  breath.   Cardiovascular:  Negative for chest pain.  Gastrointestinal:  Negative for abdominal pain, nausea and vomiting.  Genitourinary:  Negative for dysuria, flank pain, frequency and urgency.  Musculoskeletal:  Positive for arthralgias (Right hip).  Negative for back pain, myalgias and neck pain.  Neurological:  Negative for dizziness and headaches.  Hematological:  Does not bruise/bleed easily.  Psychiatric/Behavioral:  The patient is not nervous/anxious.    Blood pressure (!) 148/92, pulse 86, temperature 98.2 F (36.8 C), resp. rate 20, height 5' 9 (1.753 m), weight 77.1 kg, SpO2 97%. Physical Exam Constitutional:      General: He is not in acute distress.    Appearance: He is well-developed. He is not diaphoretic.  HENT:     Head: Normocephalic and atraumatic.  Eyes:     General: No scleral icterus.       Right eye: No discharge.        Left eye: No discharge.     Conjunctiva/sclera: Conjunctivae normal.  Cardiovascular:     Rate and Rhythm: Normal rate and regular rhythm.  Pulmonary:     Effort: Pulmonary effort is normal. No respiratory distress.  Musculoskeletal:     Cervical back: Normal range of motion.     Comments: RLE No traumatic wounds, ecchymosis, or rash  Severe TTP hip  No knee or ankle effusion  Knee stable to varus/ valgus and anterior/posterior stress  Sens DPN, SPN, TN intact  Motor EHL, ext, flex, evers 5/5  DP 2+, PT 2+, No significant edema  Skin:    General: Skin is warm and dry.  Neurological:     Mental Status: He is alert.  Psychiatric:        Mood and Affect: Mood normal.        Behavior: Behavior normal.     Assessment/Plan: Right hip fx -- Plan THA today with Dr. Kendal. Please keep NPO.    Ozell DOROTHA Ned, PA-C Orthopedic Surgery [phone] 05/21/2024, 9:04 AM

## 2024-05-21 NOTE — ED Provider Notes (Signed)
 [place] [facility] Provider Note   CSN: [id number] Arrival date & time: 05/21/24  9347     Patient presents with: [place]   Jeffrey Randolph is a 68 y.o. male past medical history of cirrhosis secondary to chronic alcohol use, atrial fibrillation on anticoagulation, history of CVA, HFpEF, recurrent hyponatremia secondary to cirrhosis followed by nephrology who presents emergency department as a mechanical fall.  Patient states he awoke this morning and was walking when he tripped over his feet causing him to have a mechanical fall in which he fell and hit his right hip.  Patient states that after he hit his right hip he fell backwards hitting his head on the floor however denies loss of consciousness.  Patient currently endorsing right hip pain.  Patient received 150 mcg of fentanyl  prior to arrival, no further acute intervention prehospital.   HPI     Prior to Admission medications   Medication Sig Start Date End Date Taking? Authorizing Provider  apixaban  (ELIQUIS ) 5 MG TABS tablet Take 1 tablet (5 mg total) by mouth 2 (two) times daily for 30 days. 12/22/18 06/12/24 Yes Bero, Ozell HERO, MD  cyanocobalamin  (VITAMIN B12) 1000 MCG tablet Take 1 tablet (1,000 mcg total) by mouth daily. Patient taking differently: Take 2,000 mcg by mouth daily. 04/16/24  Yes Elgergawy, Brayton RAMAN, MD  diltiazem  (CARDIZEM  CD) 240 MG 24 hr capsule Take 1 capsule (240 mg total) by mouth daily. 12/01/20  Yes Fenton, Clint R, PA  folic acid  (FOLVITE ) 1 MG tablet Take 1 tablet (1 mg total) by mouth daily. 04/17/24  Yes Elgergawy, Brayton RAMAN, MD  furosemide  (LASIX ) 40 MG tablet Take 40 mg by mouth daily.   Yes [provider]  ibuprofen (ADVIL) 200 MG tablet Take 400-600 mg by mouth 2 (two) times daily as needed for mild pain (pain score 1-3) or moderate pain (pain score 4-6).   Yes [provider]  lactulose  (CHRONULAC ) 10 GM/15ML solution Take 20 g by mouth 2 (two) times  daily.   Yes [provider]  methocarbamol  (ROBAXIN ) 500 MG tablet Take 500 mg by mouth daily as needed for muscle spasms.   Yes [provider]  omeprazole (PRILOSEC) 40 MG capsule Take 40 mg by mouth daily.   Yes [provider]  pregabalin  (LYRICA ) 200 MG capsule Take 1 capsule (200 mg total) by mouth 3 (three) times daily. Patient taking differently: Take 200 mg by mouth daily as needed (pain). 05/28/23  Yes Ghimire, Donalda HERO, MD  rOPINIRole  (REQUIP ) 0.5 MG tablet Take 1 mg by mouth at bedtime.   Yes [provider]  spironolactone  (ALDACTONE ) 25 MG tablet Take 25 mg by mouth daily.   Yes [provider]  tiZANidine (ZANAFLEX) 4 MG tablet Take 4 mg by mouth 3 (three) times daily. Patient not taking: Reported on 05/21/2024 04/28/24   [provider]    Allergies: Patient has no known allergies.    Review of Systems -pertinent positive and negatives in HPI  Updated Vital Signs BP 102/64   Pulse 68   Temp 98.2 F (36.8 C)   Resp 17   Ht 5' 9 (1.753 m)   Wt 77.1 kg   SpO2 95%   BMI 25.10 kg/m   Physical Exam Vitals reviewed.  Constitutional:      General: He is not in acute distress.    Interventions: Cervical collar in place.     Comments: Cervical spine collar in place  HENT:     Head: Normocephalic. No Battle's sign, abrasion, contusion, right periorbital erythema, left periorbital erythema or laceration.     Comments: Anterior nasal bridge with healing wound    Right Ear: External ear normal.     Left Ear: External ear normal.     Nose:     Right Nostril: No septal hematoma.     Left Nostril: No septal hematoma.     Mouth/Throat:     Mouth: No injury.  Eyes:     Pupils: Pupils are equal, round, and reactive to light.  Neck:     Comments: Range of motion limited secondary to cervical spine collar Cardiovascular:     Rate and Rhythm: Normal rate. Rhythm irregularly irregular.     Pulses:          Radial pulses  are 2+ on the right side and 2+ on the left side.  Pulmonary:     Effort: Pulmonary effort is normal. No tachypnea.  Chest:     Comments: No anterior lateral chest wall tenderness to palpation, no obvious deformity Abdominal:     General: There is no distension.     Palpations: Abdomen is soft.     Tenderness: There is no abdominal tenderness.  Musculoskeletal:     Cervical back: No spinous process tenderness or muscular tenderness.     Right lower leg: No edema.     Left lower leg: No edema.     Comments: Decreased range of motion right lower extremity secondary to pain with tenderness to palpation overlying right hip and lateral femur without ecchymosis, abrasion or laceration.  Mildly externally rotated right lower extremity.  No other evidence of injury to left lower extremity or bilateral upper extremities  Skin:    General: Skin is warm.  Neurological:     Mental Status: He is alert.  Psychiatric:        Behavior: Behavior is cooperative.     (all labs ordered are listed, but only abnormal results are displayed) Labs Reviewed  COMPREHENSIVE METABOLIC PANEL WITH GFR - Abnormal; Notable for the following components:      Result Value   Sodium 121 (*)    Potassium 3.4 (*)    Chloride 92 (*)    CO2 17 (*)    Glucose, Bld 126 (*)    BUN <5 (*)    Creatinine, Ser 0.51 (*)    Calcium 8.2 (*)    Albumin  3.3 (*)    AST 45 (*)    Alkaline Phosphatase 127 (*)    Total Bilirubin 1.5 (*)    All other components within normal limits  CBC - Abnormal; Notable for the following components:   RBC 3.10 (*)    Hemoglobin 11.3 (*)    HCT 32.0 (*)    MCV 103.2 (*)    MCH 36.5 (*)    RDW 18.9 (*)    nRBC 0.7 (*)    All other components within normal limits  ETHANOL - Abnormal; Notable for the following components:   Alcohol, Ethyl (B) 38 (*)    All other components within normal limits  PROTIME-INR - Abnormal; Notable for the following components:   Prothrombin Time 17.2 (*)     INR 1.3 (*)    All other components within normal limits  I-STAT CHEM 8, ED - Abnormal; Notable for the following components:   Sodium 122 (*)    Potassium 3.4 (*)    Chloride 88 (*)  BUN <3 (*)    Creatinine, Ser 0.50 (*)    Glucose, Bld 124 (*)    Calcium, Ion 1.03 (*)    TCO2 19 (*)    Hemoglobin 11.9 (*)    HCT 35.0 (*)    All other components within normal limits  I-STAT CG4 LACTIC ACID, ED - Abnormal; Notable for the following components:   Lactic Acid, Venous 4.0 (*)    All other components within normal limits  URINALYSIS, ROUTINE W REFLEX MICROSCOPIC  APTT  TYPE AND SCREEN  ABO/RH    EKG: EKG Interpretation Date/Time:  Friday May 21 2024 07:02:29 EDT Ventricular Rate:  85 PR Interval:    QRS Duration:  103 QT Interval:  388 QTC Calculation: 462 R Axis:   26  Text Interpretation: Atrial fibrillation Borderline low voltage, extremity leads RSR' in V1 or V2, probably normal variant Confirmed by Neysa Clap [phone]) on 05/21/2024 7:14:40 AM  Radiology: CT HEAD WO CONTRAST Result Date: 05/21/2024 EXAM: CT HEAD AND CERVICAL SPINE 05/21/2024 07:46:47 AM TECHNIQUE: CT of the head and cervical spine was performed without the administration of intravenous contrast. Multiplanar reformatted images are provided for review. Automated exposure control, iterative reconstruction, and/or weight based adjustment of the mA/kV was utilized to reduce the radiation dose to as low as reasonably achievable. COMPARISON: CT head and cervical spine 04/14/2024. MRI cervical spine 04/15/2024. CLINICAL HISTORY: Head trauma, moderate-severe. PT brought in by EMS for a fall at home. PT is complaining of right hip/leg pain. PT also hit his head on back right side on fall and is taking blood thinners elquis. PT is alert and talking GCS 15. PT states no LOC and was given pain meds en route. PT placed in [facility] per md upon arrival to ed. PT VSS. Level 2 fall on thinners. FINDINGS: CT HEAD BRAIN AND  VENTRICLES: No acute intracranial hemorrhage. No mass effect or midline shift. No abnormal extra-axial fluid collection. Gray-white differentiation is maintained. No hydrocephalus. Patchy hypodensities in the cerebral white matter bilaterally, similar to the prior CT and nonspecific but compatible with moderate chronic small vessel ischemic disease. Chronic lacunar infarct at the posterior aspect of the right putamen. Mild cerebral atrophy. ORBITS: Bilateral cataract extraction. SINUSES AND MASTOIDS: Unchanged posterior left ethmoid air cell opacification. Clear mastoid air cells. SOFT TISSUES AND SKULL: No acute skull fracture. No acute soft tissue abnormality. CT CERVICAL SPINE BONES AND ALIGNMENT: No acute fracture. Unchanged trace retrolisthesis of C3 on C4. DEGENERATIVE CHANGES: Similar appearance of multilevel disc degeneration compared to the recent prior studies with spinal stenosis from C3-4 through C5-6, including a moderately large central disc protrusion at C4-5 resulting in moderate spinal stenosis and a broad-based posterior disc osteophyte complex at C3-4 resulting in severe right greater than left neural foraminal stenosis. SOFT TISSUES: No prevertebral soft tissue swelling. VASCULATURE: Mild-to-moderate atherosclerotic calcification about the carotid bifurcations. IMPRESSION: 1. No acute intracranial abnormality. 2. No acute fracture or traumatic malalignment of the cervical spine. Electronically signed by: Dasie Hamburg MD 05/21/2024 08:38 AM EDT RP Workstation: [id number]   CT CERVICAL SPINE WO CONTRAST Result Date: 05/21/2024 EXAM: CT HEAD AND CERVICAL SPINE 05/21/2024 07:46:47 AM TECHNIQUE: CT of the head and cervical spine was performed without the administration of intravenous contrast. Multiplanar reformatted images are provided for review. Automated exposure control, iterative reconstruction, and/or weight based adjustment of the mA/kV was utilized to reduce the radiation dose to as low as  reasonably achievable. COMPARISON: CT head and cervical spine 04/14/2024. MRI cervical  spine 04/15/2024. CLINICAL HISTORY: Head trauma, moderate-severe. PT brought in by EMS for a fall at home. PT is complaining of right hip/leg pain. PT also hit his head on back right side on fall and is taking blood thinners elquis. PT is alert and talking GCS 15. PT states no LOC and was given pain meds en route. PT placed in [place] per md upon arrival to ed. PT VSS. Level 2 fall on thinners. FINDINGS: CT HEAD BRAIN AND VENTRICLES: No acute intracranial hemorrhage. No mass effect or midline shift. No abnormal extra-axial fluid collection. Gray-white differentiation is maintained. No hydrocephalus. Patchy hypodensities in the cerebral white matter bilaterally, similar to the prior CT and nonspecific but compatible with moderate chronic small vessel ischemic disease. Chronic lacunar infarct at the posterior aspect of the right putamen. Mild cerebral atrophy. ORBITS: Bilateral cataract extraction. SINUSES AND MASTOIDS: Unchanged posterior left ethmoid air cell opacification. Clear mastoid air cells. SOFT TISSUES AND SKULL: No acute skull fracture. No acute soft tissue abnormality. CT CERVICAL SPINE BONES AND ALIGNMENT: No acute fracture. Unchanged trace retrolisthesis of C3 on C4. DEGENERATIVE CHANGES: Similar appearance of multilevel disc degeneration compared to the recent prior studies with spinal stenosis from C3-4 through C5-6, including a moderately large central disc protrusion at C4-5 resulting in moderate spinal stenosis and a broad-based posterior disc osteophyte complex at C3-4 resulting in severe right greater than left neural foraminal stenosis. SOFT TISSUES: No prevertebral soft tissue swelling. VASCULATURE: Mild-to-moderate atherosclerotic calcification about the carotid bifurcations. IMPRESSION: 1. No acute intracranial abnormality. 2. No acute fracture or traumatic malalignment of the cervical spine. Electronically  signed by: Dasie Hamburg MD 05/21/2024 08:38 AM EDT RP Workstation: [id number]   DG Femur Min 2 Views Right Result Date: 05/21/2024 EXAM: 2 VIEW(S) XRAY OF THE RIGHT FEMUR 05/21/2024 08:30:00 AM COMPARISON: None available. CLINICAL HISTORY: Fracture. Reason for exam: fall; Best images obtainable due to patient condition and images approved by bedside physician. FINDINGS: BONES AND JOINTS: There is a mildly displaced femoral neck fracture. There is mild osteoarthritis. SOFT TISSUES: There are vascular calcifications. IMPRESSION: 1. Right femoral neck fracture, mildly displaced. 2. Mild osteoarthritis. Electronically signed by: evalene coho 05/21/2024 08:35 AM EDT RP Workstation: [id number]   CT CHEST ABDOMEN PELVIS WO CONTRAST Result Date: 05/21/2024 CLINICAL DATA:  Patient fell at home.  Right hip and leg pain EXAM: CT CHEST, ABDOMEN AND PELVIS WITHOUT CONTRAST TECHNIQUE: Multidetector CT imaging of the chest, abdomen and pelvis was performed following the standard protocol without IV contrast. RADIATION DOSE REDUCTION: This exam was performed according to the departmental dose-optimization program which includes automated exposure control, adjustment of the mA and/or kV according to patient size and/or use of iterative reconstruction technique. COMPARISON:  04/15/2024 FINDINGS: CT CHEST FINDINGS Cardiovascular: The heart size is normal. No substantial pericardial effusion. Coronary artery calcification is evident. Mild atherosclerotic calcification is noted in the wall of the thoracic aorta. Mediastinum/Nodes: No mediastinal lymphadenopathy. No evidence for gross hilar lymphadenopathy although assessment is limited by the lack of intravenous contrast on the current study. The esophagus has normal imaging features. There is no axillary lymphadenopathy. Lungs/Pleura: Multiple tiny right lung nodules are stable comparing back to a study from 05/24/2023, measuring up to about 3 mm in the right middle lobe  (100/5) interval stability compatible with benign etiology. No followup imaging is recommended. Dependent atelectasis noted in both lower lobes. Small right pleural effusion slightly increased since 04/15/2024. Musculoskeletal: See report for dedicated thoracic spine CT dictated separately. No worrisome lytic or  sclerotic osseous abnormality. CT ABDOMEN PELVIS FINDINGS Hepatobiliary: No suspicious focal abnormality in the liver on this study without intravenous contrast. Subtle nodularity of liver contour suggests cirrhosis in apparent recanalization of the paraumbilical vein is suspicious for portal venous hypertension. Calcified gallstones measure up to 9 mm. No intrahepatic or extrahepatic biliary dilation. Pancreas: No focal mass lesion. No dilatation of the main duct. No intraparenchymal cyst. No peripancreatic edema. Spleen: No splenomegaly. No suspicious focal mass lesion. Adrenals/Urinary Tract: No adrenal nodule or mass. Kidneys unremarkable. No evidence for hydroureter. The urinary bladder appears normal for the degree of distention. Stomach/Bowel: Stomach is unremarkable. No gastric wall thickening. No evidence of outlet obstruction. There is some subtle Peri duodenal stranding, mainly around the descending segment in similar to prior study. No small bowel wall thickening. No small bowel dilatation. The terminal ileum is normal. The appendix is not well visualized, but there is no edema or inflammation in the region of the cecal tip to suggest appendicitis. No gross colonic mass. No colonic wall thickening. Vascular/Lymphatic: There is moderate atherosclerotic calcification of the abdominal aorta without aneurysm. Increased number of lymph nodes are seen in the central small bowel mesentery, similar to prior in some of these lymph nodes are upper normal to borderline enlarged, stable. No pelvic sidewall lymphadenopathy. Reproductive: The prostate gland and seminal vesicles are unremarkable. Other: No  intraperitoneal free fluid. Haziness in the retroperitoneal tissues of the abdomen in central small bowel mesentery is similar. Musculoskeletal: No worrisome lytic or sclerotic osseous abnormality. Acute right femoral neck fracture is in the subcapital to transcervical location with mild comminution and varus angulation. See report for dedicated lumbar spine CT dictated separately. IMPRESSION: 1. Acute right femoral neck fracture in the subcapital to transcervical location with mild comminution and varus angulation. 2. No other acute traumatic injury in the chest, abdomen, or pelvis. See report for dedicated thoracolumbar spine CT dictated separately. 3. Small right pleural effusion, slightly increased since 04/15/2024. 4. Cirrhotic liver morphology. 5. Cholelithiasis. 6. Stable haziness in the retroperitoneal tissues of the abdomen and central small bowel mesentery with increased number of lymph nodes in the central small bowel mesentery, some of which are upper normal to borderline enlarged. Findings are stable since prior. Imaging features are nonspecific but can be seen in the setting of mesenteric adenitis or panniculitis. 7.  Aortic Atherosclerosis (ICD10-I70.0). Electronically Signed   By: Camellia Candle M.D.   On: 05/21/2024 08:08   CT T-SPINE NO CHARGE Result Date: 05/21/2024 EXAM: CT THORACIC SPINE WITHOUT CONTRAST 05/21/2024 07:46:47 AM TECHNIQUE: CT of the thoracic spine was performed without the administration of intravenous contrast. Multiplanar reformatted images are provided for review. Automated exposure control, iterative reconstruction, and/or weight based adjustment of the mA/kV was utilized to reduce the radiation dose to as low as reasonably achievable. COMPARISON: CT of the thoracic spine dated 05/24/2023. CLINICAL HISTORY: Mid-back pain. Triage notes; PT brought in by EMS for a fall at home. PT is complaining of right hip/leg pain. PT also hit his head on back right side on fall and is  taking blood thinners elquis. PT is alert and talking GCS 15. PT states no LOC and was given pain meds en route see ; charting. PT placed in [place] per md upon arrival to ed. PT VSS ; Level 2 fall on thinners FINDINGS: BONES AND ALIGNMENT: Normal vertebral body heights. No acute fracture or suspicious bone lesion. Normal alignment. DEGENERATIVE CHANGES: Mild chronic degenerative disc disease throughout the mid and lower thoracic  spine. SOFT TISSUES: Mild right-sided pleural effusion. IMPRESSION: 1. No acute abnormality of the thoracic spine related to the reported fall. 2. Mild chronic degenerative disc disease throughout the mid and lower thoracic spine. 3. Mild right-sided pleural effusion. Electronically signed by: evalene coho 05/21/2024 08:05 AM EDT RP Workstation: [id number]   CT L-SPINE NO CHARGE Result Date: 05/21/2024 EXAM: CT OF THE LUMBAR SPINE WITHOUT CONTRAST 05/21/2024 07:46:47 AM TECHNIQUE: CT of the lumbar spine was performed without the administration of intravenous contrast. Multiplanar reformatted images are provided for review. Automated exposure control, iterative reconstruction, and/or weight based adjustment of the mA/kV was utilized to reduce the radiation dose to as low as reasonably achievable. COMPARISON: CT of the lumbar spine dated 04/15/2024. CLINICAL HISTORY: PT brought in by EMS for a fall at home. PT is complaining of right hip/leg pain. PT also hit his head on back right side on fall and is taking blood thinners elquis. PT is alert and talking GCS 15. PT states no LOC and was given pain meds en route see charting. PT placed in [place] per md upon arrival to ed. PT VSS. Level 2 fall on thinners. FINDINGS: BONES AND ALIGNMENT: There is a mild rotatory levocurvature of the lumbar spine. There is a chronic superior endplate compression deformity of L1. DEGENERATIVE CHANGES: There is moderate chronic degenerative disc disease at L2-3, with posterior disc bulging and endplate  ridging. There is also chronic degenerative disc disease at L4-5 with mild central spinal canal stenosis. SOFT TISSUES: There is a mild right-sided pleural effusion. IMPRESSION: 1. No acute traumatic injury. 2. Chronic superior endplate compression deformity of L1. 3. Moderate chronic degenerative disc disease at L2-3 with posterior disc bulging and endplate ridging. 4. Chronic degenerative disc disease at L4-5 with mild central spinal canal stenosis. Electronically signed by: evalene coho 05/21/2024 08:03 AM EDT RP Workstation: [id number]   DG Pelvis Portable Result Date: 05/21/2024 EXAM: 1 or 2 VIEW(S) XRAY OF THE PELVIS 05/21/2024 07:15:00 AM COMPARISON: 05/24/2023 CLINICAL HISTORY: Trauma. Pt lost balance and fell on right hip then fell backwards and hit back of head. FINDINGS: BONES AND JOINTS: There is a deformity of the right hip compatible with acute subcapital femoral neck fracture with mild superior displacement and impaction of the fracture fragments. No additional fracture or dislocation. Degenerative changes in the lumbar spine and left hip. SOFT TISSUES: Atherosclerotic calcifications. IMPRESSION: 1. Acute subcapital femoral neck fracture of the right hip with mild superior displacement and impaction of the fracture fragments. 2. No additional fracture or dislocation. Electronically signed by: Waddell Calk MD 05/21/2024 07:45 AM EDT RP Workstation: [id number]   DG Chest Port 1 View Result Date: 05/21/2024 EXAM: 1 VIEW XRAY OF THE CHEST 05/21/2024 07:15:00 AM COMPARISON: 05/21/2024 CLINICAL HISTORY: Trauma. Pt lost balance and fell on right hip then fell backwards and hit back of head. FINDINGS: LUNGS AND PLEURA: Mild scar versus subsegmental atelectasis noted within the left base. Trace right pleural fluid with blunting of the right costophrenic angle. No pneumothorax identified. HEART AND MEDIASTINUM: Normal cardiac and mediastinal silhouette. BONES AND SOFT TISSUES: No acute osseous  abnormality. IMPRESSION: 1. Mild scar versus subsegmental atelectasis in the left base. 2. Trace right pleural fluid with blunting of the right costophrenic angle. Electronically signed by: Waddell Calk MD 05/21/2024 07:43 AM EDT RP Workstation: [id number]     Procedures   Medications Ordered in the ED  morphine  (PF) 4 MG/ML injection 4 mg (4 mg Intravenous Given 05/21/24 0727)  lactated ringers  bolus 500  mL (0 mLs Intravenous Stopped 05/21/24 1042)  morphine  (PF) 4 MG/ML injection 4 mg (4 mg Intravenous Given 05/21/24 0929)    Clinical Course as of 05/21/24 1054  Fri May 21, 2024  0701 Evaluated, fall on thinners.  No obvious deformities on exam.  Speaking in full sentences.  Does not appear to be in overt distress.  [TY]  [id number] EKG reviewed by me: Atrial fibrillation with normal axis.  No evidence of acute ischemic change [AG]  0710 X-ray pelvis reviewed by me with evidence of right femoral neck fracture [AG]  0750 CT head reviewed by me without acute ICH [AG]  0818 Ortho to see, hospitalist consulted [AG]  1015 To surgery this evening with Ortho [AG]    Clinical Course User Index [AG] Nada Chroman, DO [TY] Neysa Caron PARAS, DO                                 Medical Decision Making Amount and/or Complexity of Data Reviewed Labs: ordered. Radiology: ordered.  Risk Prescription drug management. Decision regarding hospitalization.   On initial evaluation patient is hemodynamically stable, afebrile and not in acute distress.  Patient's airway is intact, bilateral breath sounds present patient has strong palpable distal pulses.  Based upon patient's mechanism of injury with positive head strike will apply cervical spine collar.  Primary exam performed without need for acute intervention.  Chest and pelvis x-ray obtained with evidence of acute right femoral neck fracture.  Chest x-ray without evidence of pneumothorax, hemothorax, obvious acute fracture and trachea is midline.   Secondary exam performed with evidence of thoracic midline tenderness but no other obvious injury.  Will obtain laboratory studies as well as CT imaging to assess for acute injury.  CT imaging with evidence of acute right femoral neck fracture however no injuries.  Cervical spine collar cleared at bedside without evidence of midline cervical spine tenderness and full range of motion without pain.  Orthopedic surgery consulted who evaluated the patient at bedside with plans to go to the OR this evening for repair.  As patient has multiple comorbidities including hyponatremia and elevated lactic acid on laboratory studies will admit patient to medicine for continued care and management with orthopedic surgery following.  At the time of admission patient was updated on plan of care, all questions were answered.  Patient made n.p.o. in anticipation of procedure this evening.     Final diagnoses:  Fall, initial encounter  Closed fracture of neck of right femur, initial encounter [facility])  Hyponatremia    ED Discharge Orders     None      Chroman Nada DO Emergency Medicine PGY2     Nada Chroman, DO 05/21/24 1054    Neysa Caron PARAS, DO 05/21/24 1617

## 2024-05-21 NOTE — H&P (Signed)
 History and Physical    Patient: Jeffrey Randolph FMW:[id number] DOB: 07/29/1956 DOA: 05/21/2024 DOS: the patient was seen and examined on 05/21/2024 PCP: Montey Lot, PA-C  Patient coming from: Home via EMS  Chief Complaint:  Chief Complaint  Patient presents with   Fall   HPI: Jeffrey Randolph is a 67 y.o. male with medical history significant of HFpEF, atrial fibrillation, history of CVA, neuropathy, alcoholic liver cirrhosis, and hyponatremia presents with having a fall at home  He fell this morning around 6:30 to 6:45 AM while getting up to open the door for his cat, losing his balance and hitting his head on the right side and landing on his right hip. There was no tripping over objects or loss of consciousness. After the fall, he was unable to get up and called 911 for assistance.  He has a history of atrial fibrillation and is on anticoagulation therapy with Eliquis , which he last took yesterday. He describes his heart condition as 'when your heart don't beat right' and mentions taking 'heart pills' for management.  The patient reports having cancer on his nose and is being seen by a dermatologist, who prescribed a topical medication for treatment.  In terms of social history, he consumes alcohol, drinking two to three drinks in the morning and two to three in the afternoon, approximately three days a week. He does not smoke.  He is supposedly on Eliquis  but has not taken it in a couple days.  No other symptoms reported.  On admission to the emergency department patient was noted to be afebrile with stable vital signs.  Labs noted hemoglobin 11.3, sodium 121, potassium 3.4, CO2 17, calcium 8.2, AST 45, total bilirubin 1.5, alcohol level 38, and lactic acid 4.  X-rays of the pelvis significant for acute subcapital femoral neck fracture on the right hip with mild superior displacement.  Chest x-ray noted mild scarring versus segmental atelectasis in the left lung base.  CT imaging of the chest  abdomen pelvis had also been obtained which did not reveal any other acute injuries.  Urinalysis noted no signs for infection.  Orthopedics had been consulted and plan on taking the patient to surgery today..  Patient had been given morphine  IV, 5 mL of lactated ringer .  Review of Systems: As mentioned in the history of present illness. All other systems reviewed and are negative. 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   Past Surgical History:  Procedure Laterality Date   ESOPHAGOGASTRODUODENOSCOPY (EGD) WITH PROPOFOL  N/A 01/06/2023   Procedure: ESOPHAGOGASTRODUODENOSCOPY (EGD) WITH PROPOFOL ;  Surgeon: [name], MD;  Location: [facility] ENDOSCOPY;  Service: Gastroenterology;  Laterality: N/A;  Wants close to 9 AM arrival   LUMBAR LAMINECTOMY/DECOMPRESSION MICRODISCECTOMY Right 01/09/2015   Procedure: LUMBAR LAMINECTOMY/DECOMPRESSION MICRODISCECTOMY 1 LEVEL;  Surgeon: [name], MD;  Location: [facility] NEURO ORS;  Service: Neurosurgery;  Laterality: Right;  LUMBAR LAMINECTOMY/DECOMPRESSION MICRODISCECTOMY 1 LEVEL RIGHT LUMBAR 2-3   TONSILLECTOMY     Social History:  reports that he has quit smoking. His smoking use included cigarettes. He has never used smokeless tobacco. He reports that he does not currently use alcohol. He reports that he does not use drugs.  No Known Allergies  Family History  Problem Relation Age of Onset   Pancreatic cancer Mother      Prior to Admission medications   Medication Sig Start Date End Date Taking? Authorizing Provider  apixaban  (ELIQUIS ) 5 MG TABS tablet Take 1 tablet (5 mg total) by mouth 2 (two) times daily for 30 days. 12/22/18 06/12/24 Yes Theadore Ozell HERO, MD  ibuprofen (ADVIL) 200 MG tablet Take 400-600 mg by mouth 2 (two) times daily as needed for mild pain (pain score 1-3) or moderate pain (pain score 4-6).   Yes [provider]  tiZANidine (ZANAFLEX) 4 MG tablet Take 4 mg by mouth 3 (three) times daily. 04/28/24  Yes [provider]  cyanocobalamin  (VITAMIN B12) 1000 MCG tablet Take 1 tablet (1,000 mcg total) by mouth daily. 04/16/24   Elgergawy, Brayton RAMAN, MD  diltiazem  (CARDIZEM  CD) 240 MG 24 hr capsule Take 1 capsule (240 mg total) by mouth daily. 12/01/20   Fenton, Clint R, PA  folic acid  (FOLVITE ) 1 MG tablet Take 1 tablet (1 mg total) by mouth daily. 04/17/24   Elgergawy, Brayton RAMAN, MD  furosemide  (LASIX ) 40 MG tablet Take 40 mg by mouth daily.    [provider]  methocarbamol  (ROBAXIN ) 500 MG tablet Take 500 mg by mouth every 6 (six) hours as needed for muscle spasms.    [provider]  Multiple Vitamin (MULTIVITAMIN WITH MINERALS) TABS tablet Take 1 tablet by mouth daily. 04/17/24   Elgergawy, Brayton RAMAN, MD  omeprazole (PRILOSEC) 40 MG capsule Take 40 mg by mouth daily.    [provider]  pregabalin  (LYRICA ) 200 MG capsule Take 1 capsule (200 mg total) by mouth 3 (three) times daily. Patient taking differently: Take 200 mg by mouth See admin instructions. Take 1 capsule (200mg ) by mouth once daily, may take up to 3 times daily as needed for pain, neuropathy. 05/28/23   Ghimire, Donalda HERO, MD  rOPINIRole  (REQUIP ) 0.5 MG tablet Take 1 mg by mouth at bedtime.    [provider]  spironolactone  (ALDACTONE ) 25 MG tablet Take 25 mg by mouth daily.    [provider]  thiamine  (VITAMIN B-1) 100 MG tablet Take 1 tablet (100 mg total) by mouth daily.  04/17/24   Elgergawy, Brayton RAMAN, MD    Physical Exam: Vitals:   05/21/24 0701 05/21/24 0706 05/21/24 0711 05/21/24 1000  BP: (!) 148/92   102/64  Pulse: 86   68  Resp: 20   17  Temp: 98.2 F (36.8 C)     SpO2: 97%  97% 95%  Weight:  77.1 kg    Height:  5' 9 (1.753 m)     S  Constitutional: Elderly male currently in no acute distress Eyes: PERRL, lids and conjunctivae normal ENMT: Mucous membranes are moist.  .Normal dentition.  Neck: normal, supple  Respiratory: clear to auscultation bilaterally, no wheezing, no crackles. Normal respiratory effort.   Cardiovascular: Regular rate and rhythm, no murmurs / rubs / gallops. No extremity edema. 2+ pedal pulses. No carotid bruits.  Abdomen: no tenderness, no masses palpated.   Bowel sounds positive.  Musculoskeletal: no clubbing / cyanosis tenderness palpation  of the right hip.- Skin: no rashes, lesions, ulcers. No induration Neurologic: CN 2-12 grossly intact.  . Strength 5/5 in all 4.  Psychiatric: Normal judgment and insight. Alert and oriented x 3. Normal mood.   Data Reviewed:  EKG revealed atrial fibrillation at 85 bpm.  Reviewed labs, imaging, and pertinent records as documented.  Assessment and Plan:  Right femur fracture secondary to fall Patient presents after having a fall at home.  He has a history of recurrent falls.  X-rays revealed a right femoral neck fracture.  Orthopedics was consulted and plan on taking patient for surgical repair. - Admit to a telemetry bed - Hip fracture order set utilized - N.p.o. for need of surgery - Hydrocodone /Dilaudid  IV as needed for moderate to severe pain respectively - Appreciate orthopedic consultative services, we will follow-up for any further recommendations  Hyponatremia Acute on chronic.  Sodium noted to be 121.  Previously attributed to beer potomania along with possible diuretics.  Prior workup with TSH and cortisol levels noted to be within normal limits. - Holding  diuretics at this time  Atrial fibrillation Chronic.  Patient is supposed to be on Eliquis , but admits to not taking medication recently.  Patient appears to be relatively rate controlled. - Resume Eliquis  once able - Continue Cardizem  - Goal potassium least 4 magnesium  least 2  Heart failure with preserved EF Patient appears currently euvolemic.  Last echocardiogram noted EF to be 55 to 60% with indeterminate diastolic parameters when last checked 05/2019. - Strict I&Os and daily weight.  Alcoholic liver cirrhosis Elevated liver function studies Chronic.  Patient appears to be compensated at this time. Alkaline phosphatase 127, AST 45, and total bilirubin 1.5 which appears similar to prior. - Continue lactulose  - Initially held spironolactone  and furosemide  due to acute drop in sodium levels.  Reassess in a.m. and determine when medically appropriate to resume  Hypokalemia Acute.  Potassium noted to be 3.4. - Replete  Alcohol abuse Patient reports drinking 2-3 beers 2-3 times a week on average.  Alcohol level was noted to be 38. - CIWA protocols initiated - Multivitamin, thiamine , folic acid   Macrocytic anemia Chronic.  Hemoglobin stable at 11.3.  No reports of bleeding - Continue to monitor  Skin cancer of nose Patient thinks it is of the basal cell carcinoma of the left side of his nose.  He follows with dermatology with plans for having it removed. - Continue outpatient  Peripheral neuropathy - Continue Lyrica  once able.  DVT prophylaxis: None Advance Care Planning:   Code Status: Do not attempt resuscitation (DNR) PRE-ARREST INTERVENTIONS DESIRED    Consults: Orthopedics    Severity of Illness: The appropriate patient status for this patient is INPATIENT. Inpatient status is judged to be reasonable and necessary in order to provide the required intensity of service to ensure the patient's safety. The patient's presenting symptoms, physical exam findings, and initial  radiographic and laboratory data in the context of their chronic comorbidities is felt to place them at high risk for further clinical deterioration. Furthermore, it is not anticipated that the patient will be medically stable for discharge from the hospital within 2 midnights of admission.   * I certify that at the point of admission it is my clinical judgment that the patient will require inpatient hospital care spanning beyond 2 midnights from the point of admission due to high intensity of service, high risk for further deterioration and high frequency of surveillance required.*  Author: Maximino DELENA Sharps, MD 05/21/2024  10:28 AM  For on call review [email or web address].

## 2024-05-22 DIAGNOSIS — S72001A Fracture of unspecified part of neck of right femur, initial encounter for closed fracture: Secondary | ICD-10-CM | POA: Diagnosis not present

## 2024-05-22 LAB — CBC
HCT: 21.3 % — ABNORMAL LOW (ref 39.0–52.0)
Hemoglobin: 7.5 g/dL — ABNORMAL LOW (ref 13.0–17.0)
MCH: 36.1 pg — ABNORMAL HIGH (ref 26.0–34.0)
MCHC: 35.2 g/dL (ref 30.0–36.0)
MCV: 102.4 fL — ABNORMAL HIGH (ref 80.0–100.0)
Platelets: 158 K/uL (ref 150–400)
RBC: 2.08 MIL/uL — ABNORMAL LOW (ref 4.22–5.81)
RDW: 18.6 % — ABNORMAL HIGH (ref 11.5–15.5)
WBC: 9.6 K/uL (ref 4.0–10.5)
nRBC: 0.7 % — ABNORMAL HIGH (ref 0.0–0.2)

## 2024-05-22 LAB — SODIUM, URINE, RANDOM: Sodium, Ur: 24 mmol/L

## 2024-05-22 LAB — OSMOLALITY, URINE: Osmolality, Ur: 412 mosm/kg (ref 300–900)

## 2024-05-22 LAB — BASIC METABOLIC PANEL WITH GFR
Anion gap: 7 (ref 5–15)
BUN: <5 mg/dL — ABNORMAL LOW (ref 8–23)
CO2: 19 mmol/L — ABNORMAL LOW (ref 22–32)
Calcium: 8.1 mg/dL — ABNORMAL LOW (ref 8.9–10.3)
Chloride: 94 mmol/L — ABNORMAL LOW (ref 98–111)
Creatinine, Ser: 0.45 mg/dL — ABNORMAL LOW (ref 0.61–1.24)
GFR, Estimated: >60 mL/min (ref >60)
Glucose, Bld: 154 mg/dL — ABNORMAL HIGH (ref 70–99)
Potassium: 4.4 mmol/L (ref 3.5–5.1)
Sodium: 120 mmol/L — ABNORMAL LOW (ref 135–145)

## 2024-05-22 LAB — PHOSPHORUS: Phosphorus: 2.6 mg/dL (ref 2.5–4.6)

## 2024-05-22 LAB — MAGNESIUM: Magnesium: 1.5 mg/dL — ABNORMAL LOW (ref 1.7–2.4)

## 2024-05-22 MED ORDER — VITAMIN D 25 MCG (1000 UNIT) PO TABS
1000.0000 [IU] | ORAL_TABLET | Freq: Every day | ORAL | Status: DC
  Administered 2024-05-22 – 2024-05-25 (×4): 1000 [IU] via ORAL
  Filled 2024-05-22 (×4): qty 1

## 2024-05-22 MED ORDER — DILTIAZEM HCL ER COATED BEADS 120 MG PO CP24
120.0000 mg | ORAL_CAPSULE | Freq: Every day | ORAL | Status: DC
Start: 2024-05-23 — End: 2024-05-23
  Filled 2024-05-22: qty 1

## 2024-05-22 NOTE — Plan of Care (Signed)

## 2024-05-22 NOTE — Evaluation (Signed)
 Physical Therapy Evaluation Patient Details Name: Jeffrey Randolph MRN: [id number] DOB: [birth year] Today's Date: 05/22/2024  History of Present Illness  Jeffrey Randolph is a 68 y.o. male who [facility] ED 05/21/24 after a fall. X-rays of the pelvis significant for acute subcapital femoral neck fracture on the right hip with mild superior displacement. Chest x-ray noted mild scarring versus segmental atelectasis in the left lung base. Pt s/p R THA 8/15. PMH: HFpEF, atrial fibrillation, CVA, neuropathy, alcoholic liver cirrhosis, and hyponatremia.   Clinical Impression  Pt admitted with above diagnosis. PTA, pt was independent for functional mobility and modI for ADLs/IADLs. He lives in a log house with 1 STE. Pt currently with functional limitations due to the deficits listed below (see PT Problem List). He required modA for bed mobility, modA to transfer from an elevated height using RW, and minA using RW to step-to recliner pt. Pt limited by pain and decreased activity tolerance. Pt will benefit from acute skilled PT to increase his independence and safety with mobility to allow discharge. Recommend continued inpatient follow up therapy, <3 hours/day.    If plan is discharge home, recommend the following: Two people to help with walking and/or transfers;A lot of help with bathing/dressing/bathroom;Assistance with cooking/housework;Assist for transportation;Help with stairs or ramp for entrance   Can travel by private vehicle   No    Equipment Recommendations BSC/3in1  Recommendations for Other Services       Functional Status Assessment Patient has had a recent decline in their functional status and demonstrates the ability to make significant improvements in function in a reasonable and predictable amount of time.     Precautions / Restrictions Precautions Precautions: Fall Recall of Precautions/Restrictions: Intact Precaution/Restrictions Comments: No Hip Precautions Restrictions Weight  Bearing Restrictions Per Provider Order: Yes RLE Weight Bearing Per Provider Order: Weight bearing as tolerated      Mobility  Bed Mobility Overal bed mobility: Needs Assistance Bed Mobility: Supine to Sit     Supine to sit: HOB elevated, Used rails, Mod assist     General bed mobility comments: Pt sat up on L side of bed with increased time. Cues for sequencing. Assist to manage RLE. Use of bed pad to pivot hip and scoot fwd to EOB. Assist to elevate trunk.    Transfers Overall transfer level: Needs assistance Equipment used: Rolling walker (2 wheels) Transfers: Sit to/from Stand, Bed to chair/wheelchair/BSC Sit to Stand: From elevated surface, Mod assist   Step pivot transfers: From elevated surface, Mod assist       General transfer comment: Pt stood from a raise bed height. Cued pt on proper hand placement using RW. Powered up with modA. Increase time to achieve upright posture. Cues for bilat knee ext and increased WBing through BUE support on RW. Transferred to recliner chair positioned close by on left. Good eccentric control.    Ambulation/Gait Ambulation/Gait assistance: Min assist Gait Distance (Feet): 2 Feet Assistive device: Rolling walker (2 wheels) Gait Pattern/deviations: Step-to pattern, Decreased step length - right, Decreased step length - left, Decreased stride length, Decreased weight shift to right, Decreased stance time - right Gait velocity: decreased     General Gait Details: Pt took short laborious steps to pivot to recliner chair. Cues for sequencing. Assist to manage RW.  Stairs            Wheelchair Mobility     Tilt Bed    Modified Rankin (Stroke Patients Only)       Balance Overall  balance assessment: Needs assistance, History of Falls Sitting-balance support: No upper extremity supported, Feet supported Sitting balance-Leahy Scale: Fair Sitting balance - Comments: Pt sat EOB with supervision.   Standing balance support:  Bilateral upper extremity supported, During functional activity, Reliant on assistive device for balance Standing balance-Leahy Scale: Poor Standing balance comment: Pt dependent on RW                             Pertinent Vitals/Pain Pain Assessment Pain Assessment: 0-10 Pain Score: 7  Pain Location: R Hip Pain Descriptors / Indicators: Operative site guarding, Grimacing, Guarding, Discomfort Pain Intervention(s): Monitored during session, Limited activity within patient's tolerance, Repositioned    Home Living Family/patient expects to be discharged to:: Private residence Living Arrangements: Alone Available Help at Discharge: Friend(s);Available PRN/intermittently Type of Home: House [facility]) Home Access: Stairs to enter Entrance Stairs-Rails: None Entrance Stairs-Number of Steps: 1   Home Layout: One level Home Equipment: Agricultural consultant (2 wheels);Wheelchair - manual      Prior Function Prior Level of Function : Independent/Modified Independent;History of Falls (last six months);Driving             Mobility Comments: Ambulates without AD. Pt reports a significant fall history, but is unable to state an exact number. During admission in July he called 5 falls within a week. ADLs Comments: Drives a gas scooter for community mobility or gets rides with friends; independent ADLs, IADLs. Pt reports he does very little cooking. Able to clean to his house.     Extremity/Trunk Assessment   Upper Extremity Assessment Upper Extremity Assessment: Defer to OT evaluation    Lower Extremity Assessment Lower Extremity Assessment: Generalized weakness;RLE deficits/detail RLE Deficits / Details: Pt actively guarding against hip/knee PROM. Limited AROM. Grossly 2+/5 strength. RLE: Unable to fully assess due to pain RLE Sensation: history of peripheral neuropathy RLE Coordination: decreased gross motor    Cervical / Trunk Assessment Cervical / Trunk Assessment:  Normal  Communication   Communication Communication: No apparent difficulties    Cognition Arousal: Alert Behavior During Therapy: WFL for tasks assessed/performed   PT - Cognitive impairments: No apparent impairments                       PT - Cognition Comments: Pt A,Ox4 Following commands: Intact       Cueing Cueing Techniques: Verbal cues, Gestural cues     General Comments General comments (skin integrity, edema, etc.): VSS on RA. Pt nauseous upon sitting up, dry heaving but no emesis.    Exercises     Assessment/Plan    PT Assessment Patient needs continued PT services  PT Problem List Decreased strength;Decreased range of motion;Decreased activity tolerance;Decreased balance;Decreased mobility;Decreased knowledge of use of DME;Decreased safety awareness;Decreased knowledge of precautions;Pain       PT Treatment Interventions DME instruction;Gait training;Functional mobility training;Therapeutic activities;Therapeutic exercise;Balance training;Cognitive remediation;Patient/family education    PT Goals (Current goals can be found in the Care Plan section)  Acute Rehab PT Goals Patient Stated Goal: Get rehab to return home stronger PT Goal Formulation: With patient Time For Goal Achievement: 06/05/24 Potential to Achieve Goals: Good    Frequency Min 2X/week     Co-evaluation               AM-PAC PT 6 Clicks Mobility  Outcome Measure Help needed turning from your back to your side while in a flat bed without using bedrails?:  A Lot Help needed moving from lying on your back to sitting on the side of a flat bed without using bedrails?: A Lot Help needed moving to and from a bed to a chair (including a wheelchair)?: A Lot Help needed standing up from a chair using your arms (e.g., wheelchair or bedside chair)?: A Lot Help needed to walk in hospital room?: Total Help needed climbing 3-5 steps with a railing? : Total 6 Click Score: 10    End of  Session Equipment Utilized During Treatment: Gait belt Activity Tolerance: Patient tolerated treatment well;Patient limited by pain Patient left: in chair;with call bell/phone within reach;with chair alarm set Nurse Communication: Mobility status;Patient requests pain meds;Need for lift equipment PT Visit Diagnosis: Difficulty in walking, not elsewhere classified (R26.2);Unsteadiness on feet (R26.81);Other abnormalities of gait and mobility (R26.89);History of falling (Z91.81)    Time: 9074-9046 PT Time Calculation (min) (ACUTE ONLY): 28 min   Charges:       PT General Charges $$ ACUTE PT VISIT: 1 Visit         Randall SAUNDERS, PT, DPT Acute Rehabilitation Services Office: [phone] Secure Chat Preferred  Delon CHRISTELLA Callander 05/22/2024, 10:52 AM

## 2024-05-22 NOTE — Evaluation (Signed)
 Occupational Therapy Evaluation Patient Details Name: Jeffrey Randolph MRN: [id number] DOB: 01-31-1956 Today's Date: 05/22/2024   History of Present Illness   Thanh Mottern is a 68 y.o. male who presented to [facility] ED 05/21/24 after a fall. X-rays of the pelvis significant for acute subcapital femoral neck fracture on the right hip with mild superior displacement. Chest x-ray noted mild scarring versus segmental atelectasis in the left lung base. Pt s/p R THA 8/15. PMH: HFpEF, atrial fibrillation, CVA, neuropathy, alcoholic liver cirrhosis, and hyponatremia.     Clinical Impressions At baseline, pt is Independent with ADLs, IADLs, and functional mobility without an AD. However, pt reports multiple falls. Pt now presents with decreased activity tolerance, decreased balance, generalized B UE weakness, pain affecting functional level, decreased knowledge of DME/AE, and decreased safety and independence with functional tasks. Pt currently demonstrates ability to complete ADLs largely with Set up to Max assist, bed mobility with Mod assist, and functional tranfers with a RW with Mod assist. Pt VSS on RA. Pt participated well in session but was limited by fatigue and pain. Pt will benefit from acute skilled OT services to address deficits and increase safety and independence with functional tasks. Post acute discharge, pt will benefit from intensive inpatient skilled rehab services < 3 hours per day to maximize rehab potential.      If plan is discharge home, recommend the following:   A lot of help with walking and/or transfers;A lot of help with bathing/dressing/bathroom;Assistance with cooking/housework;Assist for transportation;Help with stairs or ramp for entrance     Functional Status Assessment   Patient has had a recent decline in their functional status and demonstrates the ability to make significant improvements in function in a reasonable and predictable amount of time.     Equipment  Recommendations   Other (comment) (defer to next level of care)     Recommendations for Other Services         Precautions/Restrictions   Precautions Precautions: Fall Recall of Precautions/Restrictions: Intact Precaution/Restrictions Comments: No Hip Precautions Restrictions Weight Bearing Restrictions Per Provider Order: Yes RLE Weight Bearing Per Provider Order: Weight bearing as tolerated     Mobility Bed Mobility Overal bed mobility: Needs Assistance Bed Mobility: Sit to Supine       Sit to supine: Mod assist, Used rails   General bed mobility comments: Assist to elevated B LE into bed and to manage trunk    Transfers Overall transfer level: Needs assistance Equipment used: Rolling walker (2 wheels) Transfers: Sit to/from Stand, Bed to chair/wheelchair/BSC Sit to Stand: Mod assist     Step pivot transfers: Mod assist     General transfer comment: Cued pt on proper hand placement using RW. Powered up with mod A. Increase time to achieve upright posture. Good eccentric control.      Balance Overall balance assessment: Needs assistance, History of Falls Sitting-balance support: No upper extremity supported, Feet supported Sitting balance-Leahy Scale: Fair Sitting balance - Comments: Pt sat EOB with supervision.   Standing balance support: Single extremity supported, Bilateral upper extremity supported, During functional activity, Reliant on assistive device for balance Standing balance-Leahy Scale: Poor Standing balance comment: Pt dependent on RW                           ADL either performed or assessed with clinical judgement   ADL Overall ADL's : Needs assistance/impaired Eating/Feeding: Set up;Sitting   Grooming: Set up;Sitting   Upper Body Bathing:  Contact guard assist;Sitting   Lower Body Bathing: Moderate assistance;Maximal assistance;Cueing for compensatory techniques;Sit to/from stand;Sitting/lateral leans   Upper Body  Dressing : Contact guard assist;Sitting   Lower Body Dressing: Moderate assistance;Maximal assistance;Sitting/lateral leans;Sit to/from stand;Cueing for compensatory techniques   Toilet Transfer: Moderate assistance;Cueing for safety;Cueing for sequencing;BSC/3in1;Rolling walker (2 wheels) (step-pivot) Toilet Transfer Details (indicate cue type and reason): simulated from recliner to bed Toileting- Clothing Manipulation and Hygiene: Set up;Sitting/lateral lean;Maximal assistance;Sit to/from stand;Cueing for compensatory techniques Toileting - Clothing Manipulation Details (indicate cue type and reason): Set up for use of hand held urinal in sitting; Max assist for clothing management and pericare in standing     Functional mobility during ADLs:  (pt deferred this session secondary to pain and fatigue) General ADL Comments: Pt with decreased activity tolerance, fatiguing quickly during tasks     Vision Baseline Vision/History: 4 Cataracts;0 No visual deficits (Hx of cataracts with B cataract sx) Ability to See in Adequate Light: 0 Adequate Patient Visual Report: No change from baseline Vision Assessment?: No apparent visual deficits Additional Comments: Vision [facility] for tasks assessed     Perception         Praxis         Pertinent Vitals/Pain Pain Assessment Pain Assessment: 0-10 Pain Score: 7  Pain Location: R Hip Pain Descriptors / Indicators: Operative site guarding, Grimacing, Guarding, Discomfort Pain Intervention(s): Limited activity within patient's tolerance, Monitored during session, Repositioned, RN gave pain meds during session, Ice applied     Extremity/Trunk Assessment Upper Extremity Assessment Upper Extremity Assessment: Right hand dominant;Generalized weakness   Lower Extremity Assessment Lower Extremity Assessment: Defer to PT evaluation   Cervical / Trunk Assessment Cervical / Trunk Assessment: Normal   Communication Communication Communication: No  apparent difficulties   Cognition Arousal: Alert Behavior During Therapy: WFL for tasks assessed/performed Cognition: No apparent impairments             OT - Cognition Comments: AAOx4 and pleasant throughout session                 Following commands: Intact       Cueing  General Comments   Cueing Techniques: Verbal cues;Visual cues;Gestural cues  VSS on RA. PT, RN, and pt report pt had nausea earlier this day; however, pt reports no nausea, dizziness, or lightheadedness this session. RN present at beginning of session.   Exercises     Shoulder Instructions      Home Living Family/patient expects to be discharged to:: Private residence Living Arrangements: Alone Available Help at Discharge: Friend(s);Available PRN/intermittently Type of Home: House (log cabin) Home Access: Stairs to enter [employer] of Steps: 1 Entrance Stairs-Rails: None Home Layout: One level     Bathroom Shower/Tub: Sponge bathes at baseline   [employer]: Standard (outhouse about 80 yards away from the log cabin)     Home Equipment: Agricultural consultant (2 wheels);Wheelchair - manual          Prior Functioning/Environment Prior Level of Function : Independent/Modified Independent;History of Falls (last six months);Driving             Mobility Comments: Ambulates without AD. Pt reports a significant fall history, but is unable to state an exact number. During admission in July he called 5 falls within a week. ADLs Comments: Drives a gas scooter for community mobility or gets rides with friends; independent ADLs, IADLs. Pt reports he does very little cooking. Able to clean to his house.    OT Problem List:  Decreased strength;Decreased activity tolerance;Impaired balance (sitting and/or standing);Decreased knowledge of use of DME or AE;Decreased knowledge of precautions;Pain   OT Treatment/Interventions: Self-care/ADL training;Therapeutic exercise;Energy  conservation;DME and/or AE instruction;Therapeutic activities;Patient/family education;Balance training      OT Goals(Current goals can be found in the care plan section)   Acute Rehab OT Goals Patient Stated Goal: to conitnnue with skilled rehab before going home OT Goal Formulation: With patient Time For Goal Achievement: 06/05/24 Potential to Achieve Goals: Good ADL Goals Pt Will Perform Grooming: with contact guard assist;standing Pt Will Perform Lower Body Bathing: with contact guard assist;with adaptive equipment;sitting/lateral leans;sit to/from stand Pt Will Perform Lower Body Dressing: with contact guard assist;with adaptive equipment;sitting/lateral leans;sit to/from stand Pt Will Transfer to Toilet: with contact guard assist;ambulating;regular height toilet (with least restrictive AD) Pt Will Perform Toileting - Clothing Manipulation and hygiene: with contact guard assist;sit to/from stand Pt/caregiver will Perform Home Exercise Program: Increased strength;Both right and left upper extremity;With theraband;Independently;With written HEP provided   OT Frequency:  Min 2X/week    Co-evaluation              AM-PAC OT 6 Clicks Daily Activity     Outcome Measure Help from another person eating meals?: A Little Help from another person taking care of personal grooming?: A Little Help from another person toileting, which includes using toliet, bedpan, or urinal?: A Lot Help from another person bathing (including washing, rinsing, drying)?: A Lot Help from another person to put on and taking off regular upper body clothing?: A Little Help from another person to put on and taking off regular lower body clothing?: A Lot 6 Click Score: 15   End of Session Equipment Utilized During Treatment: Gait belt;Rolling walker (2 wheels) Nurse Communication: Mobility status;Other (comment) (RN giving pain medication upon OT arrival.)  Activity Tolerance: Patient tolerated treatment  well;Patient limited by fatigue;Patient limited by pain Patient left: in bed;with call bell/phone within reach;with bed alarm set  OT Visit Diagnosis: Unsteadiness on feet (R26.81);Other abnormalities of gait and mobility (R26.89);Repeated falls (R29.6);Muscle weakness (generalized) (M62.81);History of falling (Z91.81);Pain                Time: 8492-8464 OT Time Calculation (min): 28 min Charges:  OT General Charges $OT Visit: 1 Visit OT Evaluation $OT Eval Moderate Complexity: 1 Mod  Margarie Rockey HERO., OTR/L, MA Acute Rehab [phone]   Margarie FORBES Horns 05/22/2024, 5:24 PM

## 2024-05-22 NOTE — Progress Notes (Signed)
 Orthopaedic Trauma Progress Note  SUBJECTIVE: Patient doing fairly well this morning.  Notes minimal pain in the right hip or throughout the right leg at rest.  Has not been up out of bed yet since surgery.  No chest pain. No SOB. No nausea/vomiting. No other complaints.  Tolerating diet and fluids but has not had much of an appetite.  Voiding well on his own.   Patient is from home alone, understands he would likely benefit from a SNF at discharge.  He is agreeable to this.  OBJECTIVE:  Vitals:   05/22/24 0025 05/22/24 0504  BP: 112/61 (!) 103/58  Pulse:  74  Resp: 18 17  Temp: 98.1 F (36.7 C) 98.3 F (36.8 C)  SpO2: 95% 100%    Opiates Today (MME): Today's  total administered Morphine  Milligram Equivalents: 0 Opiates Yesterday (MME): Yesterday's total administered Morphine  Milligram Equivalents: 109  General: Sitting up in bed, no acute distress Respiratory: No increased work of breathing.  Operative Extremity (RLE): TED hose in place.  Aquacel dressing is clean, dry, intact.  No significant tenderness over the hip or throughout the thigh.  No calf tenderness.  Ankle DF/PF intact.  Endorses sensation to light touch over all aspects of the foot. + DP pulse  IMAGING: Stable post op imaging.   LABS:  Results for orders placed or performed during the hospital encounter of 05/21/24 (from the past 24 hours)  Surgical pcr screen     Status: Abnormal   Collection Time: 05/21/24  1:28 PM   Specimen: Nasal Mucosa; Nasal Swab  Result Value Ref Range   MRSA, PCR NEGATIVE NEGATIVE   Staphylococcus aureus POSITIVE (A) NEGATIVE  Magnesium      Status: Abnormal   Collection Time: 05/21/24  9:29 PM  Result Value Ref Range   Magnesium  1.4 (L) 1.7 - 2.4 mg/dL  Phosphorus     Status: None   Collection Time: 05/21/24  9:29 PM  Result Value Ref Range   Phosphorus 2.9 2.5 - 4.6 mg/dL  VITAMIN D  25 Hydroxy (Vit-D Deficiency, Fractures)     Status: None   Collection Time: 05/21/24  9:29 PM   Result Value Ref Range   Vit D, 25-Hydroxy 30.54 30 - 100 ng/mL  Basic metabolic panel     Status: Abnormal   Collection Time: 05/21/24  9:29 PM  Result Value Ref Range   Sodium 121 (L) 135 - 145 mmol/L   Potassium 3.9 3.5 - 5.1 mmol/L   Chloride 93 (L) 98 - 111 mmol/L   CO2 20 (L) 22 - 32 mmol/L   Glucose, Bld 166 (H) 70 - 99 mg/dL   BUN <5 (L) 8 - 23 mg/dL   Creatinine, Ser 9.59 (L) 0.61 - 1.24 mg/dL   Calcium 7.9 (L) 8.9 - 10.3 mg/dL   GFR, Estimated >39 >39 mL/min   Anion gap 8 5 - 15    ASSESSMENT: Jeffrey Randolph is a 68 y.o. male, 1 Day Post-Op s/p fall Procedures: RIGHT TOTAL HIP ARTHROPLASTY FOR FRACTURE, ANTERIOR APPROACH  CV/Blood loss: Acute blood loss anemia, Hgb 7.5 this AM. Hemodynamically stable  PLAN: Weightbearing: WBAT RLE ROM: No hip precautions needed.  Unrestricted ROM Incisional and dressing care: Dressings left intact until follow-up  Showering: Okay to shower, Aquacel dressing may get wet Orthopedic device(s): None  Pain management: Continue current regimen VTE prophylaxis: Hold restarting Eliquis  until hemoglobin stabilizes. Continue TED hose and SCDs ID:  Ancef  2gm post op Foley/Lines:  No foley, KVO IVFs Bone health:  Vitamin D  level is low normal at 30, will start on supplementation Dispo: PT/OT evaluations today, dispo pending.  Patient would benefit from SNF at discharge.  He is agreeable to this.  Continue to monitor CBC.  Hold restarting home dose Eliquis  until hemoglobin is stabilized.    D/C recommendations: - Oxycodone , Robaxin , Tylenol  for pain control - Resume home dose Eliquis  for DVT prophylaxis - Continue 1000 units Vit D supplementation daily x 90 days  Follow - up plan: 2 weeks after d/c for wound check and repeat x-rays   Contact information:  Franky Light MD, Lauraine Moores PA-C. After hours and holidays please check [email or web address] for group call information for [facility]   Lauraine PATRIC Moores, PA-C [phone]  (office) [email or web address]

## 2024-05-22 NOTE — Progress Notes (Addendum)
 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 Marlee with nephrology in the past last for that Underlying alcoholic liver cirrhosis with  normal EGD 01/06/2023 Underlying persistent A-fib diagnosed 12/2018 follows with Dr. Shlomo failed DCCV-previous flecainide  50 twice daily Cardizem  240 Eliquis  5 twice daily Previous lumbar laminectomy Dr. Onetha 2016 Previous CVA additionally  Accidental fall at home 8/15 hit his head landed on right hip Admission labs sodium 121 (baseline is 128) potassium 3.4 chloride 92 bicarb 17 anion gap 12 BUN/creatinine 5/0.5 Alk phos 127 albumin  3.3 AST 45 ALT 23 bilirubin 1.4 WBC 5.6 hemoglobin 11.3 platelet 179 INR 1.3 Lactic acid was 4.0 urine analysis was negative Chest x-ray atelectasis portable pelvis acute subcapital femoral neck fracture right hip with mild superior displacement and impaction C-spine no acute intracranial abnormality no fracture or traumatic malalignment cervical spine CT T-spine no acute traumatic abnormality trunk thoracic spine chronic degenerative changes mid to lower thoracic spine CT chest abdomen pelvis  Dr. Kendal consulted 8/15 underwent operative management  Plan  Acute subcapital femoral neck fracture right hip Ortho directing management/care-current looks well-controlled on Oxy IR 2.5-5-has as needed morphine  and Tylenol  on board additionally-PT working with him?  Home versus short stay at SNF s  Chronic hyponatremia multifactorial serum osmolality = is 250 Drinking a lot of water -known to have tea toast potomania in the setting of chronic ethanolism Urine osmolality urine  sodium pending and ordered this morning I have instructed him to restrict his fluids to 1200 cc for now his baseline is about 128 He may need salt tablets if this does not improve and we may need to discuss him with nephrology as he is established with them in the outpatient setting  Hypomagnesemia secondary to electrolyte wasting from potomania Replace with IV mag 2 g now  Hypotension without shock multifactorial Component of anemia of acute blood loss Baseline hemoglobin 11 dropped to 7.5-considering 1 unit PRBC given hypotension overnight necessitating albumin  25 mg as well as bolus IV fluid 500 perioperatively (PTA Eliquis  had been taking) Repeat labs in a.m. Watch closely for further hypotension  Lactic acidosis at admission Could be secondary to alcoholism-was not repeated No septic etiology currently Monitor trends  Persistent A-fib [place] vas >4 on Eliquis  5 twice daily at home Because of mild hypotension Back home dose of Cardizem  from 240-180 no Eliquis  for now  HFpEF last echo EF 55-60% PTA Lasix  40, Aldactone  25 held-resume slowly  Basal cell CA nose Outpatient follow-up  Chronic ethanol abuse underlying alcoholic cirrhosis follows by Dr. [name] at [facility] done as above showed no varices Last drink was last week per patient report Patient seems quite stable not withdrawing actively no other issues Resume lactulose  20 twice daily Continues on pantoprazole  40 daily  Underlying peripheral neu-ropathy Continue Lyrica  200 as needed, Requip  1 at bedtime  Hypokalemia on admission Now resolved  Data Reviewed:   Hemoglobin 11.9-->7.5 MCV 102 WBC 9.6 Platelet 158 Sodium 120 potassium 4.4 CO2 19 BUN/creatinine 5/0.4  DVT prophylaxis: Eliquis   Status is: Inpatient Remains inpatient appropriate because:   May need skilled     Subjective:  Looks very well feels comfortable hip not sore about work with therapy not dizzy on sitting up no chest pain no  significant dark stool tarry stool no vomiting blood Otherwise looks well Tells me last drink was sometime last week seems a little bit evasive     Objective + exam Vitals:   05/21/24 2030 05/22/24 0025 05/22/24 0504 05/22/24 0813  BP:  112/61 (!) 103/58 (!) 96/47  Pulse:   74 75  Resp:  18 17 16   Temp:  98.1 F (36.7 C) 98.3 F (36.8 C) 98.5 F (36.9 C)  TempSrc:  Oral Oral Oral  SpO2: 95% 95% 100% 96%  Weight:      Height:       Filed Weights   05/21/24 0706  Weight: 77.1 kg     Examination: EOMI NCAT no focal deficit no icterus no pallor no wheeze no rales no rhonchi Left-sided nose has hyperkeratotic hypopigmented area suspicious for basal cell CA Neck soft supple No icterus no pallor Chest is clear no wheeze no rales no rhonchi S1-S2 no murmur no rub no gallop Abdomen soft no shift did not appreciate HSM Right hip has bandage in place He has TED hose on Neuro is intact     Scheduled Meds:  acetaminophen   650 mg Oral Q6H   cholecalciferol   1,000 Units Oral Daily   cyanocobalamin   2,000 mcg Oral Daily   [START ON 05/23/2024] diltiazem   120 mg Oral Daily   docusate sodium   100 mg Oral BID   lactulose   20 g Oral BID   multivitamin with minerals  1 tablet Oral Daily   pantoprazole   40 mg Oral Daily   rOPINIRole   1 mg Oral QHS   thiamine   100 mg Oral Daily   Or   thiamine   100 mg Intravenous Daily   Continuous Infusions:  Time 80  Jai-Gurmukh Taevion Sikora, MD  [facility]

## 2024-05-23 DIAGNOSIS — S72001A Fracture of unspecified part of neck of right femur, initial encounter for closed fracture: Secondary | ICD-10-CM | POA: Diagnosis not present

## 2024-05-23 LAB — CBC WITH DIFFERENTIAL/PLATELET
Abs Granulocyte: 5 K/uL (ref 1.5–6.5)
Abs Immature Granulocytes: 0.1 K/uL — ABNORMAL HIGH (ref 0.00–0.07)
Basophils Absolute: 0 K/uL (ref 0.0–0.1)
Basophils Relative: 1 %
Eosinophils Absolute: 0 K/uL (ref 0.0–0.5)
Eosinophils Relative: 1 %
HCT: 20.5 % — ABNORMAL LOW (ref 39.0–52.0)
Hemoglobin: 6.9 g/dL — CL (ref 13.0–17.0)
Immature Granulocytes: 2 %
Lymphocytes Relative: 10 %
Lymphs Abs: 0.7 K/uL (ref 0.7–4.0)
MCH: 36.7 pg — ABNORMAL HIGH (ref 26.0–34.0)
MCHC: 33.7 g/dL (ref 30.0–36.0)
MCV: 109 fL — ABNORMAL HIGH (ref 80.0–100.0)
Monocytes Absolute: 0.6 K/uL (ref 0.1–1.0)
Monocytes Relative: 9 %
Neutro Abs: 5 K/uL (ref 1.7–7.7)
Neutrophils Relative %: 77 %
Platelets: 136 K/uL — ABNORMAL LOW (ref 150–400)
RBC: 1.88 MIL/uL — ABNORMAL LOW (ref 4.22–5.81)
RDW: 19.2 % — ABNORMAL HIGH (ref 11.5–15.5)
Smear Review: NORMAL
WBC: 6.4 K/uL (ref 4.0–10.5)
nRBC: 0.8 % — ABNORMAL HIGH (ref 0.0–0.2)

## 2024-05-23 LAB — BASIC METABOLIC PANEL WITH GFR
Anion gap: 8 (ref 5–15)
BUN: <5 mg/dL — ABNORMAL LOW (ref 8–23)
CO2: 23 mmol/L (ref 22–32)
Calcium: 8.4 mg/dL — ABNORMAL LOW (ref 8.9–10.3)
Chloride: 99 mmol/L (ref 98–111)
Creatinine, Ser: 0.53 mg/dL — ABNORMAL LOW (ref 0.61–1.24)
GFR, Estimated: >60 mL/min (ref >60)
Glucose, Bld: 121 mg/dL — ABNORMAL HIGH (ref 70–99)
Potassium: 4.5 mmol/L (ref 3.5–5.1)
Sodium: 130 mmol/L — ABNORMAL LOW (ref 135–145)

## 2024-05-23 LAB — MAGNESIUM: Magnesium: 1.8 mg/dL (ref 1.7–2.4)

## 2024-05-23 LAB — PHOSPHORUS: Phosphorus: 2.9 mg/dL (ref 2.5–4.6)

## 2024-05-23 LAB — PREPARE RBC (CROSSMATCH)

## 2024-05-23 MED ORDER — SODIUM CHLORIDE 0.9 % IV SOLN: INTRAVENOUS | Status: AC

## 2024-05-23 MED ORDER — DILTIAZEM HCL ER COATED BEADS 120 MG PO CP24: 120.0000 mg | ORAL_CAPSULE | Freq: Every day | ORAL | Status: DC

## 2024-05-23 MED ORDER — SODIUM CHLORIDE 0.9% IV SOLUTION: Freq: Once | INTRAVENOUS | Status: AC

## 2024-05-23 NOTE — TOC Initial Note (Signed)
 Transition of Care [facility]) - Initial/Assessment Note    Patient Details  Name: Jeffrey Randolph MRN: [id number] Date of Birth: 02-24-1956  Transition of Care [facility]) CM/SW Contact:    Isaiah Public, LCSWA Phone Number: 05/23/2024, 9:38 AM  Clinical Narrative:                  CSW received consult for possible SNF placement at time of discharge. CSW spoke with patient regarding PT recommendation of SNF placement at time of discharge. Patient reports PTA he comes from home alone.Patient expressed understanding of PT recommendation and is agreeable to SNF placement at time of discharge. Patient gave CSW permission to fax out initial referral for possible SNF placement.CSW discussed insurance authorization process and will provide Medicare SNF ratings list with accepted SNF bed offers when available.  No further questions reported at this time. CSW to continue to follow and assist with discharge planning needs.    Expected Discharge Plan: Skilled Nursing Facility Barriers to Discharge: Continued Medical Work up   Patient Goals and CMS Choice Patient states their goals for this hospitalization and ongoing recovery are:: SNF CMS Medicare.gov Compare Post Acute Care list provided to:: Patient Choice offered to / list presented to : Patient      Expected Discharge Plan and Services In-house Referral: Clinical Social Work     Living arrangements for the past 2 months: Single Family Home                                      Prior Living Arrangements/Services Living arrangements for the past 2 months: Single Family Home Lives with:: Self Patient language and need for interpreter reviewed:: Yes Do you feel safe going back to the place where you live?: No   SNF  Need for Family Participation in Patient Care: Yes (Comment) Care giver support system in place?: Yes (comment)   Criminal Activity/Legal Involvement Pertinent to Current Situation/Hospitalization: No - Comment as  needed  Activities of Daily Living   ADL Screening (condition at time of admission) Independently performs ADLs?: Yes (appropriate for developmental age) Is the patient deaf or have difficulty hearing?: No Does the patient have difficulty seeing, even when wearing glasses/contacts?: No Does the patient have difficulty concentrating, remembering, or making decisions?: No  Permission Sought/Granted Permission sought to share information with : Case Manager, Magazine features editor, Family Supports Permission granted to share information with : Yes, Verbal Permission Granted  Share Information with NAME: Alm  Permission granted to share info w AGENCY: SNF  Permission granted to share info w Relationship: Son  Permission granted to share info w Contact Information: Alm [phone]  Emotional Assessment Appearance:: Appears stated age Attitude/Demeanor/Rapport: Gracious Affect (typically observed): Calm Orientation: : Oriented to Self, Oriented to Place, Oriented to  Time, Oriented to Situation Alcohol / Substance Use: Not Applicable Psych Involvement: No (comment)  Admission diagnosis:  Hyponatremia [E87.1] Closed right hip fracture (HCC) [S72.001A] Closed fracture of neck of right femur, initial encounter (HCC) [S72.001A] Fall, initial encounter [W19.XXXA] Patient Active Problem List   Diagnosis Date Noted   Closed right hip fracture (HCC) 05/21/2024   Heart failure with preserved ejection fraction (HCC) 05/21/2024   Macrocytic anemia 05/21/2024   Skin cancer 05/21/2024   Low back pain 04/15/2024   Alcohol abuse 04/15/2024   Thrombocytopenia (HCC) 04/15/2024   Lower extremity pain 04/15/2024   RLS (restless legs syndrome) 04/15/2024  Hyponatremia 05/08/2023   History of alcohol abuse 05/08/2023   Diarrhea 05/08/2023   Weakness 05/08/2023   Ascitic fluid 03/20/2022   Hepatic cirrhosis (HCC) 03/20/2022   Atrial fibrillation, chronic (HCC) 03/20/2022   Fall at  home, initial encounter 03/20/2022   Chest pain 03/20/2022   Secondary hypercoagulable state (HCC) 10/20/2019   Hypertension 05/26/2019   Hypokalemia 05/26/2019   History of stroke 05/26/2019   Hypomagnesemia 05/26/2019   Lumbar spondylosis 08/14/2015   Spinal stenosis of lumbar region 04/20/2015   HNP (herniated nucleus pulposus), lumbar 01/09/2015   Displacement of lumbar intervertebral disc without myelopathy 12/08/2014   PCP:  Montey Lot, PA-C Pharmacy:   [facility] - [place], [place] - [address] [place] [address] Phone: [phone] Fax: [phone]  [facility] [address] [place] [address] Phone: [phone] Fax: [phone]     Social Drivers of Health (SDOH) Social History: SDOH Screenings   Food Insecurity: No Food Insecurity (05/22/2024)  Housing: Low Risk  (05/22/2024)  Transportation Needs: No Transportation Needs (05/22/2024)  Utilities: Not At Risk (05/22/2024)  Alcohol Screen: Low Risk  (05/30/2023)  Social Connections: Unknown (05/22/2024)  Tobacco Use: Medium Risk (05/21/2024)   SDOH Interventions:     Readmission Risk Interventions    05/09/2023    2:37 PM  Readmission Risk Prevention Plan  Post Dischage Appt Complete  Medication Screening Complete  Transportation Screening Complete

## 2024-05-23 NOTE — Progress Notes (Signed)
 TRH   ROUNDING   NOTE Jeffrey Randolph FMW:[id number]  DOB: 1955/12/27  DOA: 05/21/2024  PCP: Montey Lot, PA-C  05/23/2024,8:17 AM  LOS: 2 days    Code Status: Full CODE STATUS     from: Home current Dispo: Likely skilled    68 year old 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 Marlee with nephrology in the past last for that Underlying alcoholic liver cirrhosis with  normal EGD 01/06/2023 Underlying persistent A-fib diagnosed 12/2018 follows with Dr. Shlomo failed DCCV-previous flecainide  50 twice daily Cardizem  240 Eliquis  5 twice daily Previous lumbar laminectomy Dr. Onetha 2016 Previous CVA additionally  Accidental fall at home 8/15 hit his head landed on right hip Admission labs sodium 121 (baseline is 128) potassium 3.4 chloride 92 bicarb 17 anion gap 12 BUN/creatinine 5/0.5 Alk phos 127 albumin  3.3 AST 45 ALT 23 bilirubin 1.4 WBC 5.6 hemoglobin 11.3 platelet 179 INR 1.3 Lactic acid was 4.0 urine analysis was negative Chest x-ray atelectasis portable pelvis acute subcapital femoral neck fracture right hip with mild superior displacement and impaction C-spine no acute intracranial abnormality no fracture or traumatic malalignment cervical spine CT T-spine no acute traumatic abnormality trunk thoracic spine chronic degenerative changes mid to lower thoracic spine CT chest abdomen pelvis  Dr. Kendal consulted 8/15 underwent operative management  Plan  Acute subcapital femoral neck fracture right hip WBAT Oxy IR 2.5-5 every 4, morphine  0.5-1 every 2, Robaxin  500 every 6 as needed spasm Ortho directing management--needs skilled facility at discharge   SIADH per labs with potomania-chronic alcoholism Relax fluid restriction 1500 cc sodium improved labs a.m. as able  Hypomagnesemia secondary to electrolyte wasting from potomania Improved-periodic  rechecks  Hypotension without shock multifactorial Component of anemia of acute blood loss 1 unit PRBC now to transfuse--hypotension better with lowered dose cardizem   Permanent A-fib CHA2DS2-VASc >4 Cardizem  dose adjusted -because his heart rate is slightly up we may have to split this into different components and give it every 6 hours  Lactic acidosis at admission Could be secondary to alcoholism-was not repeated No septic etiology currently and wouldn't work up further  HFpEF last echo EF 55-60% PTA Lasix  40, Aldactone  25 held-resume slowly in the outpatient setting 2/2 Hypotension  Chronic ethanol abuse underlying alcoholic cirrhosis follows by Dr. [name] [facility] done as above showed no varices Last drink was last week per patient report--- no withdrawal stop Ativan  lactulose  20 twice daily Continues on pantoprazole  40 daily  Underlying peripheral neu-ropathy Continue Lyrica  200 as needed, Requip  1 at bedtime     Basal cell CA nose Outpatient follow-up Impaired glucose tolerance Hypokalemia on admission Now resolved  Data Reviewed:   Hemoglobin 6.9 MCV 109 platelet 136 Sodium 130 BUN/creatinine 5/0.5 magnesium  now 1.8   DVT prophylaxis: Eliquis   Status is: Inpatient Remains inpatient appropriate because:   May need skilled     Subjective:  Looks well feels fair not withdrawing slight hypotension getting blood no chest pain no chills ambulating some Cardizem  on hold for now  Objective + exam Vitals:   05/22/24 0900 05/22/24 1517 05/22/24 2044 05/23/24 0610  BP: (!) 101/57 109/60 (!) 114/54 100/67  Pulse:  91 94 98  Resp:  16 17 17   Temp:  98 F (36.7 C) 97.9 F (36.6 C) 99 F (37.2 C)  TempSrc:  Oral  Oral  SpO2:  98% 98% 98%  Weight:  Height:       Filed Weights   05/21/24 0706  Weight: 77.1 kg     Examination:  EOMI NCAT S1-S2?  Murmur LLSE Abdomen soft no rebound Bandage looks intact I removed it a little bit and has  some dependent erythema edema but no hematoma Power 5/5 ROM intact  Scheduled Meds:  sodium chloride    Intravenous Once   acetaminophen   650 mg Oral Q6H   cholecalciferol   1,000 Units Oral Daily   cyanocobalamin   2,000 mcg Oral Daily   diltiazem   120 mg Oral Daily   docusate sodium   100 mg Oral BID   lactulose   20 g Oral BID   multivitamin with minerals  1 tablet Oral Daily   pantoprazole   40 mg Oral Daily   rOPINIRole   1 mg Oral QHS   thiamine   100 mg Oral Daily   Or   thiamine   100 mg Intravenous Daily   Continuous Infusions:  Time 40  Jai-Gurmukh Eldred Lievanos, MD  [facility]

## 2024-05-23 NOTE — NC FL2 (Signed)
 [place]  MEDICAID FL2 LEVEL OF CARE FORM     IDENTIFICATION  Patient Name: Jeffrey Randolph Birthdate: 10/01/1956 Sex: male Admission Date (Current Location): 05/21/2024  [facility] and [place] Number:  Producer, television/film/video and Address:  [facility], [address] [place], [place] [address]      Provider Number: [id number]  Attending Physician Name and Address:  Royal Sill, MD  Relative Name and Phone Number:  Alm Ruth (son) [phone]    Current Level of Care: Hospital Recommended Level of Care: Skilled Nursing Facility Prior Approval Number:    Date Approved/Denied:   PASRR Number: [id number]  Discharge Plan: SNF    Current Diagnoses: Patient Active Problem List   Diagnosis Date Noted   Closed right hip fracture (HCC) 05/21/2024   Heart failure with preserved ejection fraction (HCC) 05/21/2024   Macrocytic anemia 05/21/2024   Skin cancer 05/21/2024   Low back pain 04/15/2024   Alcohol abuse 04/15/2024   Thrombocytopenia (HCC) 04/15/2024   Lower extremity pain 04/15/2024   RLS (restless legs syndrome) 04/15/2024   Hyponatremia 05/08/2023   History of alcohol abuse 05/08/2023   Diarrhea 05/08/2023   Weakness 05/08/2023   Ascitic fluid 03/20/2022   Hepatic cirrhosis (HCC) 03/20/2022   Atrial fibrillation, chronic (HCC) 03/20/2022   Fall at home, initial encounter 03/20/2022   Chest pain 03/20/2022   Secondary hypercoagulable state (HCC) 10/20/2019   Hypertension 05/26/2019   Hypokalemia 05/26/2019   History of stroke 05/26/2019   Hypomagnesemia 05/26/2019   Lumbar spondylosis 08/14/2015   Spinal stenosis of lumbar region 04/20/2015   HNP (herniated nucleus pulposus), lumbar 01/09/2015   Displacement of lumbar intervertebral disc without myelopathy 12/08/2014    Orientation RESPIRATION BLADDER Height & Weight     Self, Time, Situation, Place  Normal Continent Weight: 170 lb (77.1 kg) Height:  5' 9 (175.3 cm)   BEHAVIORAL SYMPTOMS/MOOD NEUROLOGICAL BOWEL NUTRITION STATUS      Continent Diet (Please see discharge summary)  AMBULATORY STATUS COMMUNICATION OF NEEDS Skin   Limited Assist Verbally Other (Comment) (Wound/Incision (LDAs), Wound surgical closed surgical incision,Hip,R,Abrasion,Nose,Chest,Upper)                       Personal Care Assistance Level of Assistance  Bathing, Feeding, Dressing Bathing Assistance: Maximum assistance Feeding assistance: Limited assistance Dressing Assistance: Maximum assistance     Functional Limitations Info  Hearing, Speech, Sight Sight Info: Impaired (Glasses) Hearing Info: Adequate Speech Info: Adequate    SPECIAL CARE FACTORS FREQUENCY  PT (By licensed PT), OT (By licensed OT)     PT Frequency: 5x min weekly OT Frequency: 5x min weekly            Contractures Contractures Info: Not present    Additional Factors Info  Code Status, Allergies Code Status Info: DNR Allergies Info: NKA           Current Medications (05/23/2024):  This is the current hospital active medication list Current Facility-Administered Medications  Medication Dose Route Frequency Provider Last Rate Last Admin   0.9 %  sodium chloride  infusion (Manually program via Guardrails IV Fluids)   Intravenous Once Samtani, Jai-Gurmukh, MD       acetaminophen  (TYLENOL ) tablet 650 mg  650 mg Oral Q6H Danton Domino A, PA-C   650 mg at 05/23/24 0515   alum & mag hydroxide-simeth (MAALOX/MYLANTA) 200-200-20 MG/5ML suspension 30 mL  30 mL Oral Q4H PRN Danton Domino LABOR, PA-C  bisacodyl  (DULCOLAX) EC tablet 5 mg  5 mg Oral Daily PRN Danton Lauraine LABOR, PA-C       cholecalciferol  (VITAMIN D3) 25 MCG (1000 UNIT) tablet 1,000 Units  1,000 Units Oral Daily Danton Lauraine LABOR, PA-C   1,000 Units at 05/22/24 9085   cyanocobalamin  (VITAMIN B12) tablet 2,000 mcg  2,000 mcg Oral Daily Smith, Rondell A, MD   2,000 mcg at 05/22/24 9085   diltiazem  (CARDIZEM  CD) 24 hr capsule 120 mg   120 mg Oral Daily Samtani, Jai-Gurmukh, MD       diphenhydrAMINE  (BENADRYL ) 12.5 MG/5ML elixir 12.5-25 mg  12.5-25 mg Oral Q4H PRN Danton Lauraine LABOR, PA-C       docusate sodium  (COLACE) capsule 100 mg  100 mg Oral BID Danton Lauraine LABOR, PA-C   100 mg at 05/22/24 2140   lactulose  (CHRONULAC ) 10 GM/15ML solution 20 g  20 g Oral BID Smith, Rondell A, MD   20 g at 05/22/24 2134   LORazepam  (ATIVAN ) tablet 1-4 mg  1-4 mg Oral Q1H PRN Danton Lauraine LABOR, PA-C       Or   LORazepam  (ATIVAN ) injection 1-4 mg  1-4 mg Intravenous Q1H PRN Danton Lauraine LABOR, PA-C       menthol -cetylpyridinium (CEPACOL) lozenge 3 mg  1 lozenge Oral PRN Danton, Sarah A, PA-C       Or   phenol (CHLORASEPTIC) mouth spray 1 spray  1 spray Mouth/Throat PRN Danton Lauraine LABOR, PA-C       methocarbamol  (ROBAXIN ) tablet 500 mg  500 mg Oral Q6H PRN Danton Lauraine LABOR, PA-C       morphine  (PF) 2 MG/ML injection 0.5-1 mg  0.5-1 mg Intravenous Q2H PRN Danton, Sarah A, PA-C       multivitamin with minerals tablet 1 tablet  1 tablet Oral Daily Danton Lauraine LABOR, PA-C   1 tablet at 05/22/24 9085   ondansetron  (ZOFRAN ) tablet 4 mg  4 mg Oral Q6H PRN Danton Lauraine LABOR, PA-C       Or   ondansetron  (ZOFRAN ) injection 4 mg  4 mg Intravenous Q6H PRN Danton Lauraine LABOR, PA-C       oxyCODONE  (Oxy IR/ROXICODONE ) immediate release tablet 2.5-5 mg  2.5-5 mg Oral Q4H PRN Danton Lauraine LABOR, PA-C   5 mg at 05/22/24 2140   pantoprazole  (PROTONIX ) EC tablet 40 mg  40 mg Oral Daily Smith, Rondell A, MD   40 mg at 05/22/24 9085   pregabalin  (LYRICA ) capsule 200 mg  200 mg Oral Daily PRN Smith, Rondell A, MD       rOPINIRole  (REQUIP ) tablet 1 mg  1 mg Oral QHS Smith, Rondell A, MD   1 mg at 05/22/24 2140   senna-docusate (Senokot-S) tablet 1 tablet  1 tablet Oral QHS PRN Danton Lauraine LABOR, PA-C       thiamine  (VITAMIN B1) tablet 100 mg  100 mg Oral Daily Danton Lauraine LABOR, PA-C   100 mg at 05/22/24 9085   Or   thiamine  (VITAMIN B1) injection 100 mg  100 mg Intravenous  Daily Danton Lauraine LABOR, PA-C         Discharge Medications: Please see discharge summary for a list of discharge medications.  Relevant Imaging Results:  Relevant Lab Results:   Additional Information [id number]-[id number]  Isaiah Public, LCSWA

## 2024-05-23 NOTE — Progress Notes (Signed)
 Orthopaedic Progress Note  SUBJECTIVE: Patient doing fairly well this morning. Pain moderate at the hip, significantly worse with PT yesterday. Wants to get up and moving or at least out of bed. No chest pain. No SOB. No nausea/vomiting. No other complaints. Agreeable to SNF.   OBJECTIVE:  Vitals:   05/23/24 0610 05/23/24 0928  BP: 100/67 90/62  Pulse: 98 84  Resp: 17 18  Temp: 99 F (37.2 C) 97.9 F (36.6 C)  SpO2: 98% 100%    Opiates Today (MME): Today's  total administered Morphine  Milligram Equivalents: 0 Opiates Yesterday (MME): Yesterday's total administered Morphine  Milligram Equivalents: 22.5  General: Sitting up in bed, no acute distress Respiratory: No increased work of breathing.  Operative Extremity (RLE): TED hose in place.  Aquacel dressing is clean, dry, intact.  No significant tenderness over the hip or throughout the thigh.  No calf tenderness.  Ankle DF/PF intact.  Endorses sensation to light touch over all aspects of the foot. + DP pulse  IMAGING: Stable post op imaging.   LABS:  Results for orders placed or performed during the hospital encounter of 05/21/24 (from the past 24 hours)  Magnesium      Status: None   Collection Time: 05/23/24  7:16 AM  Result Value Ref Range   Magnesium  1.8 1.7 - 2.4 mg/dL  Phosphorus     Status: None   Collection Time: 05/23/24  7:16 AM  Result Value Ref Range   Phosphorus 2.9 2.5 - 4.6 mg/dL  Basic metabolic panel with GFR     Status: Abnormal   Collection Time: 05/23/24  7:16 AM  Result Value Ref Range   Sodium 130 (L) 135 - 145 mmol/L   Potassium 4.5 3.5 - 5.1 mmol/L   Chloride 99 98 - 111 mmol/L   CO2 23 22 - 32 mmol/L   Glucose, Bld 121 (H) 70 - 99 mg/dL   BUN <5 (L) 8 - 23 mg/dL   Creatinine, Ser 9.46 (L) 0.61 - 1.24 mg/dL   Calcium 8.4 (L) 8.9 - 10.3 mg/dL   GFR, Estimated >39 >39 mL/min   Anion gap 8 5 - 15  CBC with Differential/Platelet     Status: Abnormal   Collection Time: 05/23/24  7:16 AM  Result Value  Ref Range   WBC 6.4 4.0 - 10.5 K/uL   RBC 1.88 (L) 4.22 - 5.81 MIL/uL   Hemoglobin 6.9 (LL) 13.0 - 17.0 g/dL   HCT 79.4 (L) 60.9 - 47.9 %   MCV 109.0 (H) 80.0 - 100.0 fL   MCH 36.7 (H) 26.0 - 34.0 pg   MCHC 33.7 30.0 - 36.0 g/dL   RDW 80.7 (H) 88.4 - 84.4 %   Platelets 136 (L) 150 - 400 K/uL   nRBC 0.8 (H) 0.0 - 0.2 %   Neutrophils Relative % 77 %   Neutro Abs 5.0 1.7 - 7.7 K/uL   Lymphocytes Relative 10 %   Lymphs Abs 0.7 0.7 - 4.0 K/uL   Monocytes Relative 9 %   Monocytes Absolute 0.6 0.1 - 1.0 K/uL   Eosinophils Relative 1 %   Eosinophils Absolute 0.0 0.0 - 0.5 K/uL   Basophils Relative 1 %   Basophils Absolute 0.0 0.0 - 0.1 K/uL   WBC Morphology MORPHOLOGY UNREMARKABLE    Smear Review Normal platelet morphology    Immature Granulocytes 2 %   Abs Immature Granulocytes 0.10 (H) 0.00 - 0.07 K/uL   Abs Granulocyte 5.0 1.5 - 6.5 K/uL   Polychromasia PRESENT  Basophilic Stippling PRESENT   Prepare RBC (crossmatch)     Status: None   Collection Time: 05/23/24  9:00 AM  Result Value Ref Range   Order Confirmation      ORDER PROCESSED BY BLOOD BANK Performed at [facility], [address] [place], [place] [address]     ASSESSMENT: Jeffrey Randolph is a 68 y.o. male, 2 Days Post-Op s/p fall Procedures: RIGHT TOTAL HIP ARTHROPLASTY FOR FRACTURE, ANTERIOR APPROACH  CV/Blood loss: Acute blood loss anemia, Hgb trending down to 6.9 this morning. 1 unit RBC has been ordered.   PLAN: Weightbearing: WBAT RLE ROM: No hip precautions needed.  Unrestricted ROM Incisional and dressing care: Dressings left intact until follow-up  Showering: Okay to shower, Aquacel dressing may get wet Orthopedic device(s): None  Pain management: Continue current regimen VTE prophylaxis: Hold restarting Eliquis  until hemoglobin stabilizes. Continue TED hose and SCDs ID:  Ancef  2gm post op Foley/Lines:  No foley, KVO IVFs Bone health: Vitamin D  level is low normal at 30, will start on  supplementation Dispo: PT/OT evaluations completed. Patient would benefit from SNF at discharge.  He is agreeable to this, TOC on board. Continue to monitor CBC.  Hold restarting home dose Eliquis  until hemoglobin is stabilized.    D/C recommendations: - Oxycodone , Robaxin , Tylenol  for pain control - Resume home dose Eliquis  for DVT prophylaxis - Continue 1000 units Vit D supplementation daily x 90 days  Follow - up plan: 2 weeks after d/c for wound check and repeat x-rays  Army Daring, PA-C   Contact information:  Franky Light MD, Lauraine Moores PA-C. After hours and holidays please check [email or web address] for group call information for [facility]

## 2024-05-23 NOTE — Plan of Care (Signed)

## 2024-05-24 ENCOUNTER — Encounter (HOSPITAL_COMMUNITY): Payer: Self-pay | Admitting: Student

## 2024-05-24 DIAGNOSIS — S72001A Fracture of unspecified part of neck of right femur, initial encounter for closed fracture: Secondary | ICD-10-CM | POA: Diagnosis not present

## 2024-05-24 DIAGNOSIS — E440 Moderate protein-calorie malnutrition: Secondary | ICD-10-CM | POA: Insufficient documentation

## 2024-05-24 LAB — PREPARE RBC (CROSSMATCH)

## 2024-05-24 LAB — CBC WITH DIFFERENTIAL/PLATELET
Basophils Absolute: 0 K/uL (ref 0.0–0.1)
Basophils Relative: 0 %
Eosinophils Absolute: 0.1 K/uL (ref 0.0–0.5)
Eosinophils Relative: 2 %
HCT: 22.5 % — ABNORMAL LOW (ref 39.0–52.0)
Hemoglobin: 7.9 g/dL — ABNORMAL LOW (ref 13.0–17.0)
Lymphocytes Relative: 8 %
Lymphs Abs: 0.5 K/uL — ABNORMAL LOW (ref 0.7–4.0)
MCH: 36.1 pg — ABNORMAL HIGH (ref 26.0–34.0)
MCHC: 35.1 g/dL (ref 30.0–36.0)
MCV: 102.7 fL — ABNORMAL HIGH (ref 80.0–100.0)
Monocytes Absolute: 0.4 K/uL (ref 0.1–1.0)
Monocytes Relative: 6 %
Neutro Abs: 5.2 K/uL (ref 1.7–7.7)
Neutrophils Relative %: 84 %
Platelets: 141 K/uL — ABNORMAL LOW (ref 150–400)
RBC: 2.19 MIL/uL — ABNORMAL LOW (ref 4.22–5.81)
RDW: 22.3 % — ABNORMAL HIGH (ref 11.5–15.5)
WBC: 6.2 K/uL (ref 4.0–10.5)
nRBC: 0.8 % — ABNORMAL HIGH (ref 0.0–0.2)

## 2024-05-24 LAB — BASIC METABOLIC PANEL WITH GFR
Anion gap: 8 (ref 5–15)
BUN: <5 mg/dL — ABNORMAL LOW (ref 8–23)
CO2: 22 mmol/L (ref 22–32)
Calcium: 8 mg/dL — ABNORMAL LOW (ref 8.9–10.3)
Chloride: 97 mmol/L — ABNORMAL LOW (ref 98–111)
Creatinine, Ser: 0.49 mg/dL — ABNORMAL LOW (ref 0.61–1.24)
GFR, Estimated: >60 mL/min (ref >60)
Glucose, Bld: 106 mg/dL — ABNORMAL HIGH (ref 70–99)
Potassium: 3.8 mmol/L (ref 3.5–5.1)
Sodium: 127 mmol/L — ABNORMAL LOW (ref 135–145)

## 2024-05-24 LAB — MAGNESIUM: Magnesium: 1.8 mg/dL (ref 1.7–2.4)

## 2024-05-24 MED ORDER — DILTIAZEM HCL ER COATED BEADS 180 MG PO CP24
180.0000 mg | ORAL_CAPSULE | Freq: Every day | ORAL | Status: DC
  Administered 2024-05-25: 180 mg via ORAL
  Filled 2024-05-24: qty 1

## 2024-05-24 MED ORDER — ACETAMINOPHEN 325 MG PO TABS
650.0000 mg | ORAL_TABLET | Freq: Once | ORAL | Status: AC
  Administered 2024-05-24: 650 mg via ORAL
  Filled 2024-05-24: qty 2

## 2024-05-24 MED ORDER — SODIUM CHLORIDE 0.9% IV SOLUTION: Freq: Once | INTRAVENOUS | Status: AC

## 2024-05-24 MED ORDER — ENSURE PLUS HIGH PROTEIN PO LIQD
237.0000 mL | Freq: Two times a day (BID) | ORAL | Status: DC
  Administered 2024-05-24 – 2024-05-25 (×2): 237 mL via ORAL

## 2024-05-24 MED ORDER — ACETAMINOPHEN 325 MG PO TABS: 650.0000 mg | ORAL_TABLET | Freq: Four times a day (QID) | ORAL | Status: DC | PRN

## 2024-05-24 MED ORDER — VITAMIN D3 25 MCG PO TABS: 1000.0000 [IU] | ORAL_TABLET | Freq: Every day | ORAL | Status: DC

## 2024-05-24 MED ORDER — DIPHENHYDRAMINE HCL 25 MG PO CAPS
25.0000 mg | ORAL_CAPSULE | Freq: Once | ORAL | Status: AC
  Administered 2024-05-24: 25 mg via ORAL
  Filled 2024-05-24: qty 1

## 2024-05-24 MED ORDER — FUROSEMIDE 10 MG/ML IJ SOLN
20.0000 mg | Freq: Once | INTRAMUSCULAR | Status: AC
  Administered 2024-05-24: 20 mg via INTRAVENOUS
  Filled 2024-05-24: qty 2

## 2024-05-24 MED ORDER — OXYCODONE HCL 5 MG PO TABS: 2.5000 mg | ORAL_TABLET | ORAL | 0 refills | Status: DC | PRN

## 2024-05-24 MED ORDER — METOPROLOL SUCCINATE ER 25 MG PO TB24
12.5000 mg | ORAL_TABLET | Freq: Every day | ORAL | Status: DC
  Administered 2024-05-24 – 2024-05-25 (×2): 12.5 mg via ORAL
  Filled 2024-05-24 (×2): qty 1

## 2024-05-24 NOTE — Plan of Care (Signed)

## 2024-05-24 NOTE — Care Management Important Message (Signed)
 Important Message  Patient Details  Name: Jeffrey Randolph MRN: [id number] Date of Birth: 12-22-1955   Important Message Given:  Yes - Medicare IM     Jon Cruel 05/24/2024, 1:19 PM

## 2024-05-24 NOTE — Progress Notes (Signed)
 Mobility Specialist Progress Note:   05/24/24 1157  Mobility  Activity Ambulated with assistance  Level of Assistance Moderate assist, patient does 50-74%  Assistive Device Front wheel walker  Distance Ambulated (ft) 10 ft  RLE Weight Bearing Per Provider Order WBAT  Activity Response Tolerated well  Mobility Referral Yes  Mobility visit 1 Mobility  Mobility Specialist Start Time (ACUTE ONLY) 1125  Mobility Specialist Stop Time (ACUTE ONLY) 1138  Mobility Specialist Time Calculation (min) (ACUTE ONLY) 13 min   Pt received in chair agreeable to mobility. Pt required ModA for STS, contact guard during ambulation. Distance limited d/t pain. Left in bed w/ call bell and personal belongings in reach. All needs met. Bed alarm on.  Thersia Minder Mobility Specialist  Please contact vis Secure Chat or  Rehab Office [phone]

## 2024-05-24 NOTE — Progress Notes (Signed)
 Nutrition Follow Up  DOCUMENTATION CODES:   Non-severe (moderate) malnutrition in context of chronic illness (EtOH abuse, atrial fibrilation, cirrhosis, basal cell carcinoma)  INTERVENTION:  Ensure Plus High Protein po BID, each supplement provides 350 kcal and 20 grams of protein.  Liberalized diet to regular to provide increased options to pt while admitted and recovering from surgery  Continued adequate PO intake that supports increased calorie and protein needs for post op recovery  Continued vitamin D3 and vitamin B12 supplementation daily  NUTRITION DIAGNOSIS:   Moderate Malnutrition related to chronic illness (EtOH abuse, atrial fibrilation, cirrhosis, basal cell carcinoma) as evidenced by mild fat depletion, mild muscle depletion, energy intake < 75% for > or equal to 1 month. 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 Admitted after fall with R hip pain, diagnosed with displaced R femoral fx and taken to OR for R hip total arthroplasty.  8/15 admitted, R total hip arthroplasty  TOC working on discharging pt to SNF. Pt received 1 RBC unit over weekend for Hgb trending low. Daily supplementation of vitamin D3 and B12 due to lab values being in the low normal range. Diagnosed potomania.  Spoke with pt who was sitting in bedside chair at time of assessment. Pt has been working with PT post op. Pt reports good appetite since surgery, diet summary documentation shows 100% meal completion over 5 recorded meals. Pt reports no issues with n/v, constipation, or diarrhea. Pt agreeable to Ensure shakes while admitted to help boost protein and calorie  intake for post op recovery.  PTA, pt reports his appetite was fair. He reports he ate 2x per day most days, but some days his appetite was less. Pt reports his appetite tends to be lower in the summer due to the heat. Pt reports eating deer meat and fish, but did not provide details on portions or if other sides were eaten with the meat at meals. Pt reports drinking water , soda, and Gatorade throughout the day and did not disclose any alcohol use. Pt reports he likes drinking Boost supplements at home and would drink 1 occasionally, but not every day due to cost. Discussed trying to drink them 1x/day while recovering from surgery to have optimal protein intake for healing. Added information for coupons to AVS and discussed stores pt can find cheaper options at [facility]).   Pt reports no recent wt loss but limited wt hx in chart over last year. Nutrition focused physical exam shows mild muscle and mild fat depletions. Suspect depletions due to under nutrition based on pt report of intake and pt's hx of EtOH abuse/cirrhosis.   Pt reports good mobility PTA and did not use any assistive devices.   Medications reviewed and include:  Vitamin D3 1000 units daily Vitamin B12 2000mcg daily Colace Lactulose  Protonix   Labs reviewed:  Potassium 3.8<--3.4 Magnesium  1.8<--1.4 Sodium 127/Chloride 97 Hgb 7.9<--6.9 (s/p 1 unit RBC) Vitamin B12 327 (low normal) Vitamin D  30.54 (low normal)  NUTRITION - FOCUSED PHYSICAL EXAM:  Flowsheet Row Most Recent Value  Orbital Region Mild depletion  Upper Arm Region Mild depletion  Thoracic and Lumbar Region No depletion  Buccal Region  Mild depletion  Temple Region Mild depletion  Clavicle Bone Region Mild depletion  Clavicle and Acromion Bone Region Mild depletion  Scapular Bone Region Mild depletion  Dorsal Hand Mild depletion  Patellar Region No depletion  Anterior Thigh Region No depletion  Posterior Calf Region No depletion   Edema (RD Assessment) None  Hair Reviewed  Eyes Reviewed  Mouth Reviewed  Skin Reviewed  Nails Reviewed     Diet Order:   Diet Order             Diet Heart Room service appropriate? Yes; Fluid consistency: Thin; Fluid restriction: 1500 mL Fluid  Diet effective now                   EDUCATION NEEDS:   Education needs have been addressed  Skin:  Skin Assessment: Reviewed RN Assessment  Last BM:  8/16  Height:   Ht Readings from Last 1 Encounters:  05/21/24 5' 9 (1.753 m)    Weight:   Wt Readings from Last 1 Encounters:  05/21/24 77.1 kg    Ideal Body Weight:  72.7 kg  BMI:  Body mass index is 25.1 kg/m.  Estimated Nutritional Needs:   Kcal:  2000-2200  Protein:  90-105g  Fluid:  >/= 2L    Jeffrey Glance, MS, RDN, LDN Clinical Dietitian I Please reach out via secure chat

## 2024-05-24 NOTE — Progress Notes (Signed)
 Physical Therapy Treatment  Patient Details Name: Jeffrey Randolph MRN: [id number] DOB: 07/20/1956 Today's Date: 05/24/2024   History of Present Illness Jeffrey Randolph is a 68 y.o. male who presented to [facility] ED 05/21/24 after a fall. X-rays of the pelvis significant for acute subcapital femoral neck fracture on the right hip with mild superior displacement. Chest x-ray noted mild scarring versus segmental atelectasis in the left lung base. Pt s/p R THA 8/15. PMH: HFpEF, atrial fibrillation, CVA, neuropathy, alcoholic liver cirrhosis, and hyponatremia.    PT Comments  Pt progressing towards physical therapy goals. Was able to perform transfers and ambulation with gross min-mod assist and chair follow for safety/seated rest break. Initiated HEP and left handout in room. Will continue to follow and progress as able per POC.    If plan is discharge home, recommend the following: Two people to help with walking and/or transfers;A lot of help with bathing/dressing/bathroom;Assistance with cooking/housework;Assist for transportation;Help with stairs or ramp for entrance   Can travel by private vehicle     No  Equipment Recommendations  BSC/3in1    Recommendations for Other Services       Precautions / Restrictions Precautions Precautions: Fall Recall of Precautions/Restrictions: Intact Precaution/Restrictions Comments: No Hip Precautions Restrictions Weight Bearing Restrictions Per Provider Order: Yes RLE Weight Bearing Per Provider Order: Weight bearing as tolerated     Mobility  Bed Mobility Overal bed mobility: Needs Assistance Bed Mobility: Supine to Sit     Supine to sit: HOB elevated, Used rails, Mod assist     General bed mobility comments: Assist for RLE advancement towards EOB. Increased time and effort required for pt to transition fully to EOB.    Transfers Overall transfer level: Needs assistance Equipment used: Rolling walker (2 wheels) Transfers: Sit to/from Stand Sit to  Stand: Mod assist           General transfer comment: VC's for hand placement on seated surface for safety. Assist for power up to full stand and to gain/maintain standing balance.    Ambulation/Gait Ambulation/Gait assistance: Min assist, +2 safety/equipment Gait Distance (Feet): 75 Feet (x2) Assistive device: Rolling walker (2 wheels) Gait Pattern/deviations: Step-to pattern, Decreased step length - right, Decreased step length - left, Decreased stride length, Decreased weight shift to right, Decreased stance time - right Gait velocity: decreased Gait velocity interpretation: <1.8 ft/sec, indicate of risk for recurrent falls   General Gait Details: Chair follow for safety, however pt motivated for distance. 1 seated rest break required. VC's throughout for improved posture, closer walker proximity and forward gaze.   Stairs             Wheelchair Mobility     Tilt Bed    Modified Rankin (Stroke Patients Only)       Balance Overall balance assessment: Needs assistance, History of Falls Sitting-balance support: No upper extremity supported, Feet supported Sitting balance-Leahy Scale: Fair     Standing balance support: Single extremity supported, Bilateral upper extremity supported, During functional activity, Reliant on assistive device for balance Standing balance-Leahy Scale: Poor Standing balance comment: Pt dependent on RW                            Communication Communication Communication: No apparent difficulties  Cognition Arousal: Alert Behavior During Therapy: WFL for tasks assessed/performed   PT - Cognitive impairments: No apparent impairments  PT - Cognition Comments: Pt A,Ox4 Following commands: Intact      Cueing Cueing Techniques: Verbal cues, Gestural cues, Tactile cues  Exercises Total Joint Exercises Ankle Circles/Pumps: 10 reps Quad Sets: 10 reps Hip ABduction/ADduction: 10 reps,  AAROM Long Arc Quad: 5 reps, AAROM (eccentric lower)    General Comments        Pertinent Vitals/Pain Pain Assessment Pain Assessment: 0-10 Pain Score: 7  Pain Location: R Hip Pain Descriptors / Indicators: Operative site guarding, Grimacing, Guarding, Discomfort Pain Intervention(s): Limited activity within patient's tolerance, Monitored during session, Repositioned    Home Living                          Prior Function            PT Goals (current goals can now be found in the care plan section) Acute Rehab PT Goals Patient Stated Goal: Get rehab to return home stronger PT Goal Formulation: With patient Time For Goal Achievement: 06/05/24 Potential to Achieve Goals: Good Progress towards PT goals: Progressing toward goals    Frequency    Min 2X/week      PT Plan      Co-evaluation              AM-PAC PT 6 Clicks Mobility   Outcome Measure  Help needed turning from your back to your side while in a flat bed without using bedrails?: A Little Help needed moving from lying on your back to sitting on the side of a flat bed without using bedrails?: A Lot Help needed moving to and from a bed to a chair (including a wheelchair)?: A Little Help needed standing up from a chair using your arms (e.g., wheelchair or bedside chair)?: A Lot Help needed to walk in hospital room?: A Little Help needed climbing 3-5 steps with a railing? : A Lot 6 Click Score: 15    End of Session Equipment Utilized During Treatment: Gait belt Activity Tolerance: Patient tolerated treatment well;Patient limited by pain Patient left: in chair;with call bell/phone within reach;with chair alarm set Nurse Communication: Mobility status;Patient requests pain meds;Need for lift equipment PT Visit Diagnosis: Difficulty in walking, not elsewhere classified (R26.2);Unsteadiness on feet (R26.81);Other abnormalities of gait and mobility (R26.89);History of falling (Z91.81)      Time: 9057-8989 PT Time Calculation (min) (ACUTE ONLY): 28 min  Charges:    $Gait Training: 8-22 mins $Therapeutic Exercise: 8-22 mins PT General Charges $$ ACUTE PT VISIT: 1 Visit                     Leita Sable, PT, DPT Acute Rehabilitation Services Secure Chat Preferred Office: [phone]    Leita JONETTA Sable 05/24/2024, 10:50 AM

## 2024-05-24 NOTE — Progress Notes (Signed)
 PHARMACY - ANTICOAGULATION CONSULT NOTE  Pharmacy Consult:  Eliquis  Indication: atrial fibrillation  No Known Allergies  Patient Measurements: Height: 5' 9 (175.3 cm) Weight: 77.1 kg (170 lb) IBW/kg (Calculated) : 70.7 HEPARIN DW (KG): 77.1  Vital Signs: Temp: 98.2 F (36.8 C) (08/18 0839) Temp Source: Oral (08/18 0500) BP: 104/47 (08/18 0839) Pulse Rate: 87 (08/18 0839)  Labs: Recent Labs    05/22/24 0800 05/23/24 0716 05/24/24 0518  HGB 7.5* 6.9* 7.9*  HCT 21.3* 20.5* 22.5*  PLT 158 136* 141*  CREATININE 0.45* 0.53* 0.49*    Estimated Creatinine Clearance: 88.4 mL/min (A) (by C-G formula based on SCr of 0.49 mg/dL (L)).   Medical History: Past Medical History:  Diagnosis Date   Atrial fibrillation (HCC)    Atrial fibrillation with rapid ventricular response (HCC)    BPH (benign prostatic hyperplasia)    Cirrhosis (HCC)    CVA (cerebral infarction)    Dysrhythmia    GERD (gastroesophageal reflux disease)    Hemiplegia due to old stroke (HCC)    History of stroke 05/26/2019   HNP (herniated nucleus pulposus), lumbar 01/09/2015   Hypertension    Hypokalemia    Hypomagnesemia 05/26/2019   Hyponatremia    Neuropathy    RLS (restless legs syndrome)    Stroke (HCC)    Left foot always feel 2 x larger, walks with a limp.     Assessment: 63 YOM presented s/p fall and underwent R THA on 8/15.  Pharmacy consulted to resume PTA Eliquis  for Afib on POD2 if hgb > 9 g/dL.  Patient has not meet criteria to resume Eliquis .  Hemoglobin 7.9 this AM.  No overt bleeding reported.  Goal of Therapy:  Appropriate anticoagulation Monitor platelets by anticoagulation protocol: Yes   Plan:  Continue daily CBC Resume Eliquis  when criteria is met Monitor for s/sx of bleeding  Anesha Hackert D. [place], PharmD, BCPS, BCCCP 05/24/2024, 8:53 AM

## 2024-05-24 NOTE — Progress Notes (Addendum)
 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 Marlee with nephrology in the past last for that Underlying alcoholic liver cirrhosis with  normal EGD 01/06/2023 Underlying persistent A-fib diagnosed 12/2018 follows with Dr. Shlomo failed DCCV-previous flecainide  50 twice daily Cardizem  240 Eliquis  5 twice daily Previous lumbar laminectomy Dr. Onetha 2016 Previous CVA additionally  Accidental fall at home 8/15 hit his head landed on right hip Admission labs sodium 121 (baseline is 128) potassium 3.4 chloride 92 bicarb 17 anion gap 12 BUN/creatinine 5/0.5 Alk phos 127 albumin  3.3 AST 45 ALT 23 bilirubin 1.4 WBC 5.6 hemoglobin 11.3 platelet 179 INR 1.3 Lactic acid was 4.0 urine analysis was negative Chest x-ray atelectasis portable pelvis acute subcapital femoral neck fracture right hip with mild superior displacement and impaction C-spine no acute intracranial abnormality no fracture or traumatic malalignment cervical spine CT T-spine no acute traumatic abnormality trunk thoracic spine chronic degenerative changes mid to lower thoracic spine CT chest abdomen pelvis  Dr. Kendal consulted 8/15 underwent operative management 8/17 transfuse 1 unit of blood for hypotension 8/18 transfuse to hemoglobin above 8 1 more unit given  Plan  Acute subcapital femoral neck fracture right hip WBAT Oxy IR 2.5-5 every 4, morphine  0.5-1 every 2, Robaxin  500 every 6 as needed spasm-pain seems fairly well-controlled not really requiring IV meds Ortho directing management--needs skilled facility at discharge   SIADH per labs with potomania-chronic  alcoholism Relax fluid restriction 1500 cc sodium improved cautioned him to keep on fluid restriction and do not overdo it  Hypotension without shock multifactorial Component of anemia of acute blood loss Transfused as above hemoglobin relatively  Eliquis  held until  Permanent A-fib CHA2DS2-VASc >4 Cardizem  dose adjusted to slightly elevated 180 CD dosing for tomorrow morning he received 120 today Note MAP was a little low this morning on recheck blood pressure looks fair I am adding Toprol -XL 12.5 and will reassess pressures and heart rate over the next day or so Cannot resume Eliquis  until we see hemoglobin elevated but higher and ensure no further bleeding so we need to keep the patient on TED hose or compression hose for the time being-  Lactic acidosis at admission Could be secondary to alcoholism-was not repeated No septic etiology currently and wouldn't work up further  HFpEF last echo EF 55-60% PTA Lasix  40, Aldactone  25 held-resume slowly in the outpatient setting 2/2 Hypotension  Chronic ethanol abuse underlying alcoholic cirrhosis follows by [facility]. [name] at [facility] done as above showed no varices Last drink was last week per patient report--- no withdrawal discontinued on 8/17 Ativan  an lactulose  20 twice daily Continues on pantoprazole  40 daily  Underlying peripheral neu-ropathy Continue Lyrica  200 as needed, Requip  1 at bedtime     Basal cell CA nose Outpatient follow-up Impaired glucose tolerance Hypokalemia on admission Hypomagnesemia secondary to electrolyte wasting from potomania Improved-periodic rechecks Now resolved   Data Reviewed:   Hemoglobin 6.9-->7.9 WBC 6.2 MCV 102, platelet 141 Sodium 127 chloride 97 BUN/creatinine 5/0.4 magnesium  1.8   DVT prophylaxis: TED hose  Status is: Inpatient Remains inpatient appropriate because:   Awaiting stability before going to  skilled     Subjective:  Walk several feet in the room looks well  feels fair no chest pain-he is stable, has no cp no fever no chills no rigors  Objective + exam Vitals:   05/23/24 2004 05/24/24 0500 05/24/24 0839 05/24/24 1255  BP: (!) 111/58 108/67 (!) 104/47 (!) 104/56  Pulse: 96 80 87 86  Resp: 18 18 18    Temp: 98 F (36.7 C) 97.7 F (36.5 C) 98.2 F (36.8 C)   TempSrc: Oral Oral    SpO2: 100% 99% 100% 100%  Weight:      Height:       Filed Weights   05/21/24 0706  Weight: 77.1 kg     Examination:  EOMI NCAT S1-S2?  Murmur LLSE Abd sof tnt nd no rebound  No ict no pallor No edema Power 5/5   Scheduled Meds:  acetaminophen   650 mg Oral Q6H   cholecalciferol   1,000 Units Oral Daily   cyanocobalamin   2,000 mcg Oral Daily   [START ON 05/25/2024] diltiazem   180 mg Oral Daily   docusate sodium   100 mg Oral BID   feeding supplement  237 mL Oral BID BM   lactulose   20 g Oral BID   metoprolol  succinate  12.5 mg Oral Daily   pantoprazole   40 mg Oral Daily   rOPINIRole   1 mg Oral QHS   Continuous Infusions:  Time 40  Jai-Gurmukh Akya Fiorello, MD  [facility]

## 2024-05-25 DIAGNOSIS — M6259 Muscle wasting and atrophy, not elsewhere classified, multiple sites: Secondary | ICD-10-CM | POA: Diagnosis not present

## 2024-05-25 DIAGNOSIS — S72011D Unspecified intracapsular fracture of right femur, subsequent encounter for closed fracture with routine healing: Secondary | ICD-10-CM | POA: Diagnosis not present

## 2024-05-25 DIAGNOSIS — E440 Moderate protein-calorie malnutrition: Secondary | ICD-10-CM | POA: Diagnosis not present

## 2024-05-25 DIAGNOSIS — M6281 Muscle weakness (generalized): Secondary | ICD-10-CM | POA: Diagnosis not present

## 2024-05-25 DIAGNOSIS — Z741 Need for assistance with personal care: Secondary | ICD-10-CM | POA: Diagnosis not present

## 2024-05-25 DIAGNOSIS — M47816 Spondylosis without myelopathy or radiculopathy, lumbar region: Secondary | ICD-10-CM | POA: Diagnosis not present

## 2024-05-25 DIAGNOSIS — I5030 Unspecified diastolic (congestive) heart failure: Secondary | ICD-10-CM | POA: Diagnosis not present

## 2024-05-25 DIAGNOSIS — S72001A Fracture of unspecified part of neck of right femur, initial encounter for closed fracture: Secondary | ICD-10-CM | POA: Diagnosis not present

## 2024-05-25 DIAGNOSIS — C4490 Unspecified malignant neoplasm of skin, unspecified: Secondary | ICD-10-CM | POA: Diagnosis not present

## 2024-05-25 DIAGNOSIS — R2689 Other abnormalities of gait and mobility: Secondary | ICD-10-CM | POA: Diagnosis not present

## 2024-05-25 LAB — CBC WITH DIFFERENTIAL/PLATELET
Basophils Absolute: 0.1 K/uL (ref 0.0–0.1)
Basophils Relative: 1 %
Eosinophils Absolute: 0.1 K/uL (ref 0.0–0.5)
Eosinophils Relative: 2 %
HCT: 25.2 % — ABNORMAL LOW (ref 39.0–52.0)
Hemoglobin: 8.7 g/dL — ABNORMAL LOW (ref 13.0–17.0)
Lymphocytes Relative: 10 %
Lymphs Abs: 0.6 K/uL — ABNORMAL LOW (ref 0.7–4.0)
MCH: 34.4 pg — ABNORMAL HIGH (ref 26.0–34.0)
MCHC: 34.5 g/dL (ref 30.0–36.0)
MCV: 99.6 fL (ref 80.0–100.0)
Monocytes Absolute: 0.4 K/uL (ref 0.1–1.0)
Monocytes Relative: 7 %
Neutro Abs: 5 K/uL (ref 1.7–7.7)
Neutrophils Relative %: 80 %
Platelets: 168 K/uL (ref 150–400)
RBC: 2.53 MIL/uL — ABNORMAL LOW (ref 4.22–5.81)
RDW: 22.5 % — ABNORMAL HIGH (ref 11.5–15.5)
WBC: 6.3 K/uL (ref 4.0–10.5)
nRBC: 0.6 % — ABNORMAL HIGH (ref 0.0–0.2)

## 2024-05-25 LAB — TYPE AND SCREEN
ABO/RH(D): O POS
Antibody Screen: NEGATIVE
Unit division: 0
Unit division: 0

## 2024-05-25 LAB — COMPREHENSIVE METABOLIC PANEL WITH GFR
ALT: 19 U/L (ref 0–44)
AST: 30 U/L (ref 15–41)
Albumin: 2.8 g/dL — ABNORMAL LOW (ref 3.5–5.0)
Alkaline Phosphatase: 81 U/L (ref 38–126)
Anion gap: 8 (ref 5–15)
BUN: <5 mg/dL — ABNORMAL LOW (ref 8–23)
CO2: 25 mmol/L (ref 22–32)
Calcium: 8.3 mg/dL — ABNORMAL LOW (ref 8.9–10.3)
Chloride: 96 mmol/L — ABNORMAL LOW (ref 98–111)
Creatinine, Ser: 0.48 mg/dL — ABNORMAL LOW (ref 0.61–1.24)
GFR, Estimated: >60 mL/min (ref >60)
Glucose, Bld: 105 mg/dL — ABNORMAL HIGH (ref 70–99)
Potassium: 4 mmol/L (ref 3.5–5.1)
Sodium: 129 mmol/L — ABNORMAL LOW (ref 135–145)
Total Bilirubin: 1.5 mg/dL — ABNORMAL HIGH (ref 0.0–1.2)
Total Protein: 5.7 g/dL — ABNORMAL LOW (ref 6.5–8.1)

## 2024-05-25 LAB — BPAM RBC
Blood Product Expiration Date: 202508242359
Blood Product Expiration Date: 202509162359
ISSUE DATE / TIME: 202508171023
ISSUE DATE / TIME: 202508181852
Unit Type and Rh: 5100
Unit Type and Rh: 5100

## 2024-05-25 MED ORDER — APIXABAN 5 MG PO TABS: 5.0000 mg | ORAL_TABLET | Freq: Two times a day (BID) | ORAL | Status: AC

## 2024-05-25 MED ORDER — METOPROLOL SUCCINATE ER 25 MG PO TB24: 12.5000 mg | ORAL_TABLET | Freq: Every day | ORAL | Status: AC

## 2024-05-25 MED ORDER — DILTIAZEM HCL ER COATED BEADS 180 MG PO CP24: 180.0000 mg | ORAL_CAPSULE | Freq: Every day | ORAL | Status: DC

## 2024-05-25 NOTE — Plan of Care (Signed)
  Problem: Health Behavior/Discharge Planning: Goal: Ability to manage health-related needs will improve Outcome: Progressing   Problem: Clinical Measurements: Goal: Ability to maintain clinical measurements within normal limits will improve Outcome: Progressing Goal: Will remain free from infection Outcome: Progressing Goal: Respiratory complications will improve Outcome: Progressing Goal: Cardiovascular complication will be avoided Outcome: Progressing   Problem: Activity: Goal: Risk for activity intolerance will decrease Outcome: Progressing   Problem: Nutrition: Goal: Adequate nutrition will be maintained Outcome: Progressing   Problem: Coping: Goal: Level of anxiety will decrease Outcome: Progressing   Problem: Elimination: Goal: Will not experience complications related to bowel motility Outcome: Progressing Goal: Will not experience complications related to urinary retention Outcome: Progressing   Problem: Pain Managment: Goal: General experience of comfort will improve and/or be controlled Outcome: Progressing   Problem: Safety: Goal: Ability to remain free from injury will improve Outcome: Progressing   Problem: Education: Goal: Knowledge of the prescribed therapeutic regimen will improve Outcome: Progressing Goal: Understanding of discharge needs will improve Outcome: Progressing

## 2024-05-25 NOTE — Progress Notes (Signed)
 Reviewed AVS, patient expressed understanding.  Removed IV, Site clean, dry and intact.  See LDA for information on wounds at discharge. Patient states all belongings brought to the hospital at time of admission are accounted for and packed to take home.  Patient 2 plus assist; will be transported by [facility] to SNF.

## 2024-05-25 NOTE — TOC Progression Note (Signed)
 Transition of Care [facility]) - Progression Note    Patient Details  Name: Jeffrey Randolph MRN: [id number] Date of Birth: Mar 14, 1956  Transition of Care [facility]) CM/SW Contact  Bridget Cordella Simmonds, LCSW Phone Number: 05/25/2024, 8:34 AM  Clinical Narrative:   Bed offers provided to pt on medicare choice document.  Pt will accept offer at [facility].  CSW confirmed with [facility] that they can receive pt with approved auth.     Expected Discharge Plan: Skilled Nursing Facility Barriers to Discharge: Continued Medical Work up               Expected Discharge Plan and Services In-house Referral: Clinical Social Work     Living arrangements for the past 2 months: Single Family Home                                       Social Drivers of Health (SDOH) Interventions SDOH Screenings   Food Insecurity: No Food Insecurity (05/22/2024)  Housing: Low Risk  (05/22/2024)  Transportation Needs: No Transportation Needs (05/22/2024)  Utilities: Not At Risk (05/22/2024)  Alcohol Screen: Low Risk  (05/30/2023)  Social Connections: Unknown (05/22/2024)  Tobacco Use: Medium Risk (05/21/2024)    Readmission Risk Interventions    05/09/2023    2:37 PM  Readmission Risk Prevention Plan  Post Dischage Appt Complete  Medication Screening Complete  Transportation Screening Complete

## 2024-05-25 NOTE — Discharge Summary (Signed)
 Physician Discharge Summary  Jeffrey Randolph FMW:[id number] DOB: 09/10/1956 DOA: 05/21/2024  PCP: Montey Lot, PA-C  Admit date: 05/21/2024 Discharge date: 05/25/2024  Time spent: 27 minutes  Recommendations for Outpatient Follow-up:  Needs Chem-12 CBC 1 week Outpatient follow-up Dr. Kendal team in 2 weeks wound check, WBAT-Eliquis  resumed post discharge Check vitamin D  levels in 1 month and resume supplementation Note dosage changes of various meds discontinuation of various blood pressure meds in favor of Cardizem  180, metoprolol  XL 12.5  Discharge Diagnoses:  MAIN problem for hospitalization   Acute hip fracture  Please see below for itemized issues addressed in HOpsital- refer to other progress notes for clarity if needed  Discharge Condition: Improved  Diet recommendation: Regular diet but keep slight fluid restriction of 1500 cc  Filed Weights   05/21/24 0706  Weight: 77.1 kg    History of present illness:  68 year old 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 Marlee with nephrology in the past last for that Underlying alcoholic liver cirrhosis with  normal EGD 01/06/2023 Underlying persistent A-fib diagnosed 12/2018 follows with Dr. Shlomo failed DCCV-previous flecainide  50 twice daily Cardizem  240 Eliquis  5 twice daily Previous lumbar laminectomy Dr. Onetha 2016 Previous CVA additionally   Accidental fall at home 8/15 hit his head landed on right hip Admission labs sodium 121 (baseline is 128) potassium 3.4 chloride 92 bicarb 17 anion gap 12 BUN/creatinine 5/0.5 Alk phos 127 albumin  3.3 AST 45 ALT 23 bilirubin 1.4 WBC 5.6 hemoglobin 11.3 platelet 179 INR 1.3 Lactic acid was 4.0 urine analysis was negative Chest x-ray atelectasis portable pelvis acute subcapital femoral neck fracture right hip with mild superior displacement and  impaction C-spine no acute intracranial abnormality no fracture or traumatic malalignment cervical spine CT T-spine no acute traumatic abnormality trunk thoracic spine chronic degenerative changes mid to lower thoracic spine CT chest abdomen pelvis   Dr. Kendal consulted 8/15 underwent operative management 8/17 transfuse 1 unit of blood for hypotension 8/18 transfuse to hemoglobin above 8 1 more unit given   Plan   Acute subcapital femoral neck fracture right hip WBAT  Oxy IR 2.5-5 every 4, high-dose Tylenol  and muscle relaxants/Lyrica  at discharge-wean some of these as he improves please in the outpatient setting  SIADH per labs with potomania-chronic alcoholism Relax fluid restriction 1500 cc sodium  Sodium 129 at discharge he looks well feels better  Hypotension without shock multifactorial Component of anemia of acute blood loss Transfused as above --need Hemoglobin >8 reliably given afib Hemoglobin at discharge stabilized   Permanent A-fib CHA2DS2-VASc >4 Cardizem  dose dose adjusted several times during hospital stay-going home on 180 with metoprolol  XL 12.5 He was previously on higher dose of Cardizem  He was maintained on TED hose until his hemoglobin came up with transfusion He has been resumed on Eliquis  twice daily and is aware of warning signs   Lactic acidosis at admission Could be secondary to alcoholism-was not repeated No septic etiology currently and wouldn't work up further   HFpEF last echo EF 55-60% PTA Lasix  40, Aldactone  25 held-resume slowly in the outpatient setting 2/2 Hypotension as above   Chronic ethanol abuse underlying alcoholic cirrhosis follows by Dr. [name] at [facility] done as above showed no varices Last drink was last week per patient report--- no withdrawal discontinued on 8/17 Ativan  an lactulose  20 twice daily should continue he does not need any further meds for constipation Continues  on pantoprazole  40 daily   Underlying  peripheral neu-ropathy Continue Lyrica  200 as needed, Requip  1 at bedtime    Discharge Exam: Vitals:   05/25/24 0609 05/25/24 0746  BP: (!) 112/57 (!) 106/57  Pulse: 85 81  Resp: 18 18  Temp: 98.4 F (36.9 C) 98.5 F (36.9 C)  SpO2: 99% 99%    Subj on day of d/c   Current awake alert no distress looks well feels fair Eating drinking Power 5/5 In good spirits Chest is clear S1-S2 no murmur  Discharge Instructions   Discharge Instructions     Diet - low sodium heart healthy   Complete by: As directed    Increase activity slowly   Complete by: As directed    Leave dressing on - Keep it clean, dry, and intact until clinic visit   Complete by: As directed       Allergies as of 05/25/2024   No Known Allergies      Medication List     STOP taking these medications    folic acid  1 MG tablet Commonly known as: FOLVITE    furosemide  40 MG tablet Commonly known as: LASIX    ibuprofen 200 MG tablet Commonly known as: ADVIL   spironolactone  25 MG tablet Commonly known as: ALDACTONE        TAKE these medications    acetaminophen  325 MG tablet Commonly known as: TYLENOL  Take 2 tablets (650 mg total) by mouth every 6 (six) hours as needed for mild pain (pain score 1-3), fever or headache.   apixaban  5 MG Tabs tablet Commonly known as: ELIQUIS  Take 1 tablet (5 mg total) by mouth 2 (two) times daily.   chlorhexidine  4 % external liquid Commonly known as: HIBICLENS  Apply 15 mLs (1 Application total) topically as directed for 30 doses. Use as directed daily for 5 days every other week for 6 weeks.   cyanocobalamin  1000 MCG tablet Commonly known as: VITAMIN B12 Take 1 tablet (1,000 mcg total) by mouth daily. What changed: how much to take   diltiazem  180 MG 24 hr capsule Commonly known as: CARDIZEM  CD Take 1 capsule (180 mg total) by mouth daily. Start taking on: May 26, 2024 What changed:  medication strength how much to take   lactulose  10  GM/15ML solution Commonly known as: CHRONULAC  Take 20 g by mouth 2 (two) times daily.   methocarbamol  500 MG tablet Commonly known as: ROBAXIN  Take 500 mg by mouth daily as needed for muscle spasms.   metoprolol  succinate 25 MG 24 hr tablet Commonly known as: TOPROL -XL Take 0.5 tablets (12.5 mg total) by mouth daily. Start taking on: May 26, 2024   mupirocin  ointment 2 % Commonly known as: BACTROBAN  Place 1 Application into the nose 2 (two) times daily for 60 doses. Use as directed 2 times daily for 5 days every other week for 6 weeks.   omeprazole 40 MG capsule Commonly known as: PRILOSEC Take 40 mg by mouth daily.   oxyCODONE  5 MG immediate release tablet Commonly known as: Oxy IR/ROXICODONE  Take 0.5-1 tablets (2.5-5 mg total) by mouth every 4 (four) hours as needed for moderate pain (pain score 4-6) or severe pain (pain score 7-10) (2.5 mg pain score 4-6, 5 mg pain score 7-10).   pregabalin  200 MG capsule Commonly known as: LYRICA  Take 1 capsule (200 mg total) by mouth 3 (three) times daily. What changed:  when to take this reasons to take this   rOPINIRole  0.5 MG tablet Commonly known as: REQUIP   Take 1 mg by mouth at bedtime.   vitamin D3 25 MCG tablet Commonly known as: CHOLECALCIFEROL  Take 1 tablet (1,000 Units total) by mouth daily.               Discharge Care Instructions  (From admission, onward)           Start     Ordered   05/25/24 0000  Leave dressing on - Keep it clean, dry, and intact until clinic visit        05/25/24 1016           No Known Allergies  Follow-up Information     Haddix, Franky SQUIBB, MD. Schedule an appointment as soon as possible for a visit in 2 week(s).   Specialty: Orthopedic Surgery Why: for wound check and repeat x-rays Contact information: [address] [place] [address] [phone]                  The results of significant diagnostics from this hospitalization (including imaging,  microbiology, ancillary and laboratory) are listed below for reference.    Significant Diagnostic Studies: DG HIP UNILAT W OR W/O PELVIS 2-3 VIEWS RIGHT Result Date: 05/21/2024 CLINICAL DATA:  Status post total hip arthroplasty EXAM: DG HIP (WITH OR WITHOUT PELVIS) 2-3V RIGHT COMPARISON:  Radiographs 05/21/2024 FINDINGS: Right hip THA. No evidence of loosening. Postoperative gas in the right femur. Vascular calcifications. IMPRESSION: Right hip THA. Electronically Signed   By: Norman Gatlin M.D.   On: 05/21/2024 23:26   DG HIP UNILAT WITH PELVIS 1V RIGHT Result Date: 05/21/2024 CLINICAL DATA:  Right hip replacement EXAM: DG HIP (WITH OR WITHOUT PELVIS) 1V RIGHT COMPARISON:  05/21/2024 FINDINGS: Multiple intraoperative spot images demonstrate changes of right hip replacement. No hardware bony complicating feature. IMPRESSION: Right hip replacement.  No visible complicating feature. Electronically Signed   By: Franky Crease M.D.   On: 05/21/2024 22:01   DG C-Arm 1-60 Min-No Report Result Date: 05/21/2024 Fluoroscopy was utilized by the requesting physician.  No radiographic interpretation.   DG C-Arm 1-60 Min-No Report Result Date: 05/21/2024 Fluoroscopy was utilized by the requesting physician.  No radiographic interpretation.   CT HEAD WO CONTRAST Result Date: 05/21/2024 EXAM: CT HEAD AND CERVICAL SPINE 05/21/2024 07:46:47 AM TECHNIQUE: CT of the head and cervical spine was performed without the administration of intravenous contrast. Multiplanar reformatted images are provided for review. Automated exposure control, iterative reconstruction, and/or weight based adjustment of the mA/kV was utilized to reduce the radiation dose to as low as reasonably achievable. COMPARISON: CT head and cervical spine 04/14/2024. MRI cervical spine 04/15/2024. CLINICAL HISTORY: Head trauma, moderate-severe. PT brought in by EMS for a fall at home. PT is complaining of right hip/leg pain. PT also hit his head on back  right side on fall and is taking blood thinners elquis. PT is alert and talking GCS 15. PT states no LOC and was given pain meds en route. PT placed in [place] per md upon arrival to ed. PT VSS. Level 2 fall on thinners. FINDINGS: CT HEAD BRAIN AND VENTRICLES: No acute intracranial hemorrhage. No mass effect or midline shift. No abnormal extra-axial fluid collection. Gray-white differentiation is maintained. No hydrocephalus. Patchy hypodensities in the cerebral white matter bilaterally, similar to the prior CT and nonspecific but compatible with moderate chronic small vessel ischemic disease. Chronic lacunar infarct at the posterior aspect of the right putamen. Mild cerebral atrophy. ORBITS: Bilateral cataract extraction. SINUSES AND MASTOIDS: Unchanged posterior left ethmoid  air cell opacification. Clear mastoid air cells. SOFT TISSUES AND SKULL: No acute skull fracture. No acute soft tissue abnormality. CT CERVICAL SPINE BONES AND ALIGNMENT: No acute fracture. Unchanged trace retrolisthesis of C3 on C4. DEGENERATIVE CHANGES: Similar appearance of multilevel disc degeneration compared to the recent prior studies with spinal stenosis from C3-4 through C5-6, including a moderately large central disc protrusion at C4-5 resulting in moderate spinal stenosis and a broad-based posterior disc osteophyte complex at C3-4 resulting in severe right greater than left neural foraminal stenosis. SOFT TISSUES: No prevertebral soft tissue swelling. VASCULATURE: Mild-to-moderate atherosclerotic calcification about the carotid bifurcations. IMPRESSION: 1. No acute intracranial abnormality. 2. No acute fracture or traumatic malalignment of the cervical spine. Electronically signed by: Dasie Hamburg MD 05/21/2024 08:38 AM EDT RP Workstation: [id number]   CT CERVICAL SPINE WO CONTRAST Result Date: 05/21/2024 EXAM: CT HEAD AND CERVICAL SPINE 05/21/2024 07:46:47 AM TECHNIQUE: CT of the head and cervical spine was performed without the  administration of intravenous contrast. Multiplanar reformatted images are provided for review. Automated exposure control, iterative reconstruction, and/or weight based adjustment of the mA/kV was utilized to reduce the radiation dose to as low as reasonably achievable. COMPARISON: CT head and cervical spine 04/14/2024. MRI cervical spine 04/15/2024. CLINICAL HISTORY: Head trauma, moderate-severe. PT brought in by EMS for a fall at home. PT is complaining of right hip/leg pain. PT also hit his head on back right side on fall and is taking blood thinners elquis. PT is alert and talking GCS 15. PT states no LOC and was given pain meds en route. PT placed in [place] per md upon arrival to ed. PT VSS. Level 2 fall on thinners. FINDINGS: CT HEAD BRAIN AND VENTRICLES: No acute intracranial hemorrhage. No mass effect or midline shift. No abnormal extra-axial fluid collection. Gray-white differentiation is maintained. No hydrocephalus. Patchy hypodensities in the cerebral white matter bilaterally, similar to the prior CT and nonspecific but compatible with moderate chronic small vessel ischemic disease. Chronic lacunar infarct at the posterior aspect of the right putamen. Mild cerebral atrophy. ORBITS: Bilateral cataract extraction. SINUSES AND MASTOIDS: Unchanged posterior left ethmoid air cell opacification. Clear mastoid air cells. SOFT TISSUES AND SKULL: No acute skull fracture. No acute soft tissue abnormality. CT CERVICAL SPINE BONES AND ALIGNMENT: No acute fracture. Unchanged trace retrolisthesis of C3 on C4. DEGENERATIVE CHANGES: Similar appearance of multilevel disc degeneration compared to the recent prior studies with spinal stenosis from C3-4 through C5-6, including a moderately large central disc protrusion at C4-5 resulting in moderate spinal stenosis and a broad-based posterior disc osteophyte complex at C3-4 resulting in severe right greater than left neural foraminal stenosis. SOFT TISSUES: No prevertebral  soft tissue swelling. VASCULATURE: Mild-to-moderate atherosclerotic calcification about the carotid bifurcations. IMPRESSION: 1. No acute intracranial abnormality. 2. No acute fracture or traumatic malalignment of the cervical spine. Electronically signed by: Dasie Hamburg MD 05/21/2024 08:38 AM EDT RP Workstation: [id number]   DG Femur Min 2 Views Right Result Date: 05/21/2024 EXAM: 2 VIEW(S) XRAY OF THE RIGHT FEMUR 05/21/2024 08:30:00 AM COMPARISON: None available. CLINICAL HISTORY: Fracture. Reason for exam: fall; Best images obtainable due to patient condition and images approved by bedside physician. FINDINGS: BONES AND JOINTS: There is a mildly displaced femoral neck fracture. There is mild osteoarthritis. SOFT TISSUES: There are vascular calcifications. IMPRESSION: 1. Right femoral neck fracture, mildly displaced. 2. Mild osteoarthritis. Electronically signed by: evalene coho 05/21/2024 08:35 AM EDT RP Workstation: [id number]   CT CHEST ABDOMEN PELVIS WO CONTRAST Result Date:  05/21/2024 CLINICAL DATA:  Patient fell at home.  Right hip and leg pain EXAM: CT CHEST, ABDOMEN AND PELVIS WITHOUT CONTRAST TECHNIQUE: Multidetector CT imaging of the chest, abdomen and pelvis was performed following the standard protocol without IV contrast. RADIATION DOSE REDUCTION: This exam was performed according to the departmental dose-optimization program which includes automated exposure control, adjustment of the mA and/or kV according to patient size and/or use of iterative reconstruction technique. COMPARISON:  04/15/2024 FINDINGS: CT CHEST FINDINGS Cardiovascular: The heart size is normal. No substantial pericardial effusion. Coronary artery calcification is evident. Mild atherosclerotic calcification is noted in the wall of the thoracic aorta. Mediastinum/Nodes: No mediastinal lymphadenopathy. No evidence for gross hilar lymphadenopathy although assessment is limited by the lack of intravenous contrast on the  current study. The esophagus has normal imaging features. There is no axillary lymphadenopathy. Lungs/Pleura: Multiple tiny right lung nodules are stable comparing back to a study from 05/24/2023, measuring up to about 3 mm in the right middle lobe (100/5) interval stability compatible with benign etiology. No followup imaging is recommended. Dependent atelectasis noted in both lower lobes. Small right pleural effusion slightly increased since 04/15/2024. Musculoskeletal: See report for dedicated thoracic spine CT dictated separately. No worrisome lytic or sclerotic osseous abnormality. CT ABDOMEN PELVIS FINDINGS Hepatobiliary: No suspicious focal abnormality in the liver on this study without intravenous contrast. Subtle nodularity of liver contour suggests cirrhosis in apparent recanalization of the paraumbilical vein is suspicious for portal venous hypertension. Calcified gallstones measure up to 9 mm. No intrahepatic or extrahepatic biliary dilation. Pancreas: No focal mass lesion. No dilatation of the main duct. No intraparenchymal cyst. No peripancreatic edema. Spleen: No splenomegaly. No suspicious focal mass lesion. Adrenals/Urinary Tract: No adrenal nodule or mass. Kidneys unremarkable. No evidence for hydroureter. The urinary bladder appears normal for the degree of distention. Stomach/Bowel: Stomach is unremarkable. No gastric wall thickening. No evidence of outlet obstruction. There is some subtle Peri duodenal stranding, mainly around the descending segment in similar to prior study. No small bowel wall thickening. No small bowel dilatation. The terminal ileum is normal. The appendix is not well visualized, but there is no edema or inflammation in the region of the cecal tip to suggest appendicitis. No gross colonic mass. No colonic wall thickening. Vascular/Lymphatic: There is moderate atherosclerotic calcification of the abdominal aorta without aneurysm. Increased number of lymph nodes are seen in the  central small bowel mesentery, similar to prior in some of these lymph nodes are upper normal to borderline enlarged, stable. No pelvic sidewall lymphadenopathy. Reproductive: The prostate gland and seminal vesicles are unremarkable. Other: No intraperitoneal free fluid. Haziness in the retroperitoneal tissues of the abdomen in central small bowel mesentery is similar. Musculoskeletal: No worrisome lytic or sclerotic osseous abnormality. Acute right femoral neck fracture is in the subcapital to transcervical location with mild comminution and varus angulation. See report for dedicated lumbar spine CT dictated separately. IMPRESSION: 1. Acute right femoral neck fracture in the subcapital to transcervical location with mild comminution and varus angulation. 2. No other acute traumatic injury in the chest, abdomen, or pelvis. See report for dedicated thoracolumbar spine CT dictated separately. 3. Small right pleural effusion, slightly increased since 04/15/2024. 4. Cirrhotic liver morphology. 5. Cholelithiasis. 6. Stable haziness in the retroperitoneal tissues of the abdomen and central small bowel mesentery with increased number of lymph nodes in the central small bowel mesentery, some of which are upper normal to borderline enlarged. Findings are stable since prior. Imaging features are nonspecific but  can be seen in the setting of mesenteric adenitis or panniculitis. 7.  Aortic Atherosclerosis (ICD10-I70.0). Electronically Signed   By: Camellia Candle M.D.   On: 05/21/2024 08:08   CT T-SPINE NO CHARGE Result Date: 05/21/2024 EXAM: CT THORACIC SPINE WITHOUT CONTRAST 05/21/2024 07:46:47 AM TECHNIQUE: CT of the thoracic spine was performed without the administration of intravenous contrast. Multiplanar reformatted images are provided for review. Automated exposure control, iterative reconstruction, and/or weight based adjustment of the mA/kV was utilized to reduce the radiation dose to as low as reasonably achievable.  COMPARISON: CT of the thoracic spine dated 05/24/2023. CLINICAL HISTORY: Mid-back pain. Triage notes; PT brought in by EMS for a fall at home. PT is complaining of right hip/leg pain. PT also hit his head on back right side on fall and is taking blood thinners elquis. PT is alert and talking GCS 15. PT states no LOC and was given pain meds en route see ; charting. PT placed in [place] per md upon arrival to ed. PT VSS ; Level 2 fall on thinners FINDINGS: BONES AND ALIGNMENT: Normal vertebral body heights. No acute fracture or suspicious bone lesion. Normal alignment. DEGENERATIVE CHANGES: Mild chronic degenerative disc disease throughout the mid and lower thoracic spine. SOFT TISSUES: Mild right-sided pleural effusion. IMPRESSION: 1. No acute abnormality of the thoracic spine related to the reported fall. 2. Mild chronic degenerative disc disease throughout the mid and lower thoracic spine. 3. Mild right-sided pleural effusion. Electronically signed by: evalene coho 05/21/2024 08:05 AM EDT RP Workstation: [id number]   CT L-SPINE NO CHARGE Result Date: 05/21/2024 EXAM: CT OF THE LUMBAR SPINE WITHOUT CONTRAST 05/21/2024 07:46:47 AM TECHNIQUE: CT of the lumbar spine was performed without the administration of intravenous contrast. Multiplanar reformatted images are provided for review. Automated exposure control, iterative reconstruction, and/or weight based adjustment of the mA/kV was utilized to reduce the radiation dose to as low as reasonably achievable. COMPARISON: CT of the lumbar spine dated 04/15/2024. CLINICAL HISTORY: PT brought in by EMS for a fall at home. PT is complaining of right hip/leg pain. PT also hit his head on back right side on fall and is taking blood thinners elquis. PT is alert and talking GCS 15. PT states no LOC and was given pain meds en route see charting. PT placed in [place] per md upon arrival to ed. PT VSS. Level 2 fall on thinners. FINDINGS: BONES AND ALIGNMENT: There is a mild  rotatory levocurvature of the lumbar spine. There is a chronic superior endplate compression deformity of L1. DEGENERATIVE CHANGES: There is moderate chronic degenerative disc disease at L2-3, with posterior disc bulging and endplate ridging. There is also chronic degenerative disc disease at L4-5 with mild central spinal canal stenosis. SOFT TISSUES: There is a mild right-sided pleural effusion. IMPRESSION: 1. No acute traumatic injury. 2. Chronic superior endplate compression deformity of L1. 3. Moderate chronic degenerative disc disease at L2-3 with posterior disc bulging and endplate ridging. 4. Chronic degenerative disc disease at L4-5 with mild central spinal canal stenosis. Electronically signed by: evalene coho 05/21/2024 08:03 AM EDT RP Workstation: [id number]   DG Pelvis Portable Result Date: 05/21/2024 EXAM: 1 or 2 VIEW(S) XRAY OF THE PELVIS 05/21/2024 07:15:00 AM COMPARISON: 05/24/2023 CLINICAL HISTORY: Trauma. Pt lost balance and fell on right hip then fell backwards and hit back of head. FINDINGS: BONES AND JOINTS: There is a deformity of the right hip compatible with acute subcapital femoral neck fracture with mild superior displacement and impaction of the fracture  fragments. No additional fracture or dislocation. Degenerative changes in the lumbar spine and left hip. SOFT TISSUES: Atherosclerotic calcifications. IMPRESSION: 1. Acute subcapital femoral neck fracture of the right hip with mild superior displacement and impaction of the fracture fragments. 2. No additional fracture or dislocation. Electronically signed by: Waddell Calk MD 05/21/2024 07:45 AM EDT RP Workstation: [id number]   DG Chest Port 1 View Result Date: 05/21/2024 EXAM: 1 VIEW XRAY OF THE CHEST 05/21/2024 07:15:00 AM COMPARISON: 05/21/2024 CLINICAL HISTORY: Trauma. Pt lost balance and fell on right hip then fell backwards and hit back of head. FINDINGS: LUNGS AND PLEURA: Mild scar versus subsegmental atelectasis noted  within the left base. Trace right pleural fluid with blunting of the right costophrenic angle. No pneumothorax identified. HEART AND MEDIASTINUM: Normal cardiac and mediastinal silhouette. BONES AND SOFT TISSUES: No acute osseous abnormality. IMPRESSION: 1. Mild scar versus subsegmental atelectasis in the left base. 2. Trace right pleural fluid with blunting of the right costophrenic angle. Electronically signed by: Waddell Calk MD 05/21/2024 07:43 AM EDT RP Workstation: [id number]    Microbiology: Recent Results (from the past 240 hours)  Surgical pcr screen     Status: Abnormal   Collection Time: 05/21/24  1:28 PM   Specimen: Nasal Mucosa; Nasal Swab  Result Value Ref Range Status   MRSA, PCR NEGATIVE NEGATIVE Final   Staphylococcus aureus POSITIVE (A) NEGATIVE Final    Comment: (NOTE) The Xpert SA Assay (FDA approved for NASAL specimens in patients 37 years of age and older), is one component of a comprehensive surveillance program. It is not intended to diagnose infection nor to guide or monitor treatment. Performed at [facility], [address], [place], [place] [address]      Labs: Basic Metabolic Panel: Recent Labs  Lab 05/21/24 2129 05/22/24 0800 05/23/24 0716 05/24/24 0518 05/25/24 0642  NA 121* 120* 130* 127* 129*  K 3.9 4.4 4.5 3.8 4.0  CL 93* 94* 99 97* 96*  CO2 20* 19* 23 22 25   GLUCOSE 166* 154* 121* 106* 105*  BUN <5* <5* <5* <5* <5*  CREATININE 0.40* 0.45* 0.53* 0.49* 0.48*  CALCIUM 7.9* 8.1* 8.4* 8.0* 8.3*  MG 1.4* 1.5* 1.8 1.8  --   PHOS 2.9 2.6 2.9  --   --    Liver Function Tests: Recent Labs  Lab 05/21/24 0657 05/25/24 0642  AST 45* 30  ALT 23 19  ALKPHOS 127* 81  BILITOT 1.5* 1.5*  PROT 6.7 5.7*  ALBUMIN  3.3* 2.8*   No results for input(s): LIPASE, AMYLASE in the last 168 hours. No results for input(s): AMMONIA in the last 168 hours. CBC: Recent Labs  Lab 05/21/24 0657 05/21/24 0712 05/22/24 0800 05/23/24 0716  05/24/24 0518 05/25/24 0642  WBC 5.6  --  9.6 6.4 6.2 6.3  NEUTROABS  --   --   --  5.0 5.2 5.0  HGB 11.3* 11.9* 7.5* 6.9* 7.9* 8.7*  HCT 32.0* 35.0* 21.3* 20.5* 22.5* 25.2*  MCV 103.2*  --  102.4* 109.0* 102.7* 99.6  PLT 179  --  158 136* 141* 168   Cardiac Enzymes: No results for input(s): CKTOTAL, CKMB, CKMBINDEX, TROPONINI in the last 168 hours. BNP: BNP (last 3 results) No results for input(s): BNP in the last 8760 hours.  ProBNP (last 3 results) No results for input(s): PROBNP in the last 8760 hours.  CBG: No results for input(s): GLUCAP in the last 168 hours.  Signed:  Jai-Gurmukh Karo Rog MD   [facility]  05/25/2024, 10:16 AM

## 2024-05-25 NOTE — Progress Notes (Signed)
 Mobility Specialist Progress Note:    05/25/24 1100  Mobility  Activity Ambulated with assistance  Level of Assistance Minimal assist, patient does 75% or more  Assistive Device Front wheel walker  Distance Ambulated (ft) 100 ft  RLE Weight Bearing Per Provider Order WBAT  Activity Response Tolerated well  Mobility Referral Yes  Mobility visit 1 Mobility  Mobility Specialist Start Time (ACUTE ONLY) 1035  Mobility Specialist Stop Time (ACUTE ONLY) 1049  Mobility Specialist Time Calculation (min) (ACUTE ONLY) 14 min   Pt received in bed agreeable to mobility. minA needed for bed mobility/STS and contact guard for ambulation. No c/o throughout. Chair follow for safety. Returned to room w/o fault. Left in chair w/ call bell and person belongings in reach. All needs met.  Thersia Minder Mobility Specialist  Please contact vis Secure Chat or  Rehab Office [phone]

## 2024-05-25 NOTE — TOC Progression Note (Addendum)
 Transition of Care [facility]) - Progression Note    Patient Details  Name: Sequoyah Counterman MRN: [id number] Date of Birth: 18-Apr-1956  Transition of [facility]) CM/SW Contact  Bridget Cordella Simmonds, LCSW Phone Number: 05/25/2024, 10:15 AM  Clinical Narrative:   SNF auth request submitted in [place] and approved: [id number], 3 days: 8/19-8/21.  [facility] confirms they can receive pt today. MD informed.  Expected Discharge Plan: Skilled Nursing Facility Barriers to Discharge: Continued Medical Work up               Expected Discharge Plan and Services In-house Referral: Clinical Social Work     Living arrangements for the past 2 months: Single Family Home                                       Social Drivers of Health (SDOH) Interventions SDOH Screenings   Food Insecurity: No Food Insecurity (05/22/2024)  Housing: Low Risk  (05/22/2024)  Transportation Needs: No Transportation Needs (05/22/2024)  Utilities: Not At Risk (05/22/2024)  Alcohol Screen: Low Risk  (05/30/2023)  Social Connections: Unknown (05/22/2024)  Tobacco Use: Medium Risk (05/21/2024)    Readmission Risk Interventions    05/09/2023    2:37 PM  Readmission Risk Prevention Plan  Post Dischage Appt Complete  Medication Screening Complete  Transportation Screening Complete

## 2024-05-25 NOTE — TOC Transition Note (Addendum)
 Transition of Care [facility]) - Discharge Note   Patient Details  Name: Jeffrey Randolph MRN: [id number] Date of Birth: June 12, 1956  [facility] CM/SW Contact:  Bridget Cordella Simmonds, LCSW Phone Number: 05/25/2024, 11:01 AM   Clinical Narrative:   Pt discharging to [facility], room 904, RN report [phone].  PTAR called 1100.   1130: TC son Ricky--he called back, CSW updated him on DC today.  Final next level of care: Skilled Nursing Facility Barriers to Discharge: Barriers Resolved   Patient Goals and CMS Choice Patient states their goals for this hospitalization and ongoing recovery are:: SNF CMS Medicare.gov Compare Post Acute Care list provided to:: Patient Choice offered to / list presented to : Patient      Discharge Placement              Patient chooses bed at: [facility] Patient to be transferred to facility by: ptar Name of family member notified: unable to reach or leave message for either son    Discharge Plan and Services Additional resources added to the After Visit Summary for   In-house Referral: Clinical Social Work                                   Social Drivers of Health (SDOH) Interventions SDOH Screenings   Food Insecurity: No Food Insecurity (05/22/2024)  Housing: Low Risk  (05/22/2024)  Transportation Needs: No Transportation Needs (05/22/2024)  Utilities: Not At Risk (05/22/2024)  Alcohol Screen: Low Risk  (05/30/2023)  Social Connections: Unknown (05/22/2024)  Tobacco Use: Medium Risk (05/21/2024)     Readmission Risk Interventions    05/09/2023    2:37 PM  Readmission Risk Prevention Plan  Post Dischage Appt Complete  Medication Screening Complete  Transportation Screening Complete

## 2024-05-26 DIAGNOSIS — D62 Acute posthemorrhagic anemia: Secondary | ICD-10-CM | POA: Diagnosis not present

## 2024-05-26 DIAGNOSIS — I4821 Permanent atrial fibrillation: Secondary | ICD-10-CM | POA: Diagnosis not present

## 2024-05-26 DIAGNOSIS — S72001A Fracture of unspecified part of neck of right femur, initial encounter for closed fracture: Secondary | ICD-10-CM | POA: Diagnosis not present

## 2024-05-26 DIAGNOSIS — G629 Polyneuropathy, unspecified: Secondary | ICD-10-CM | POA: Diagnosis not present

## 2024-05-26 DIAGNOSIS — E222 Syndrome of inappropriate secretion of antidiuretic hormone: Secondary | ICD-10-CM | POA: Diagnosis not present

## 2024-05-26 DIAGNOSIS — I509 Heart failure, unspecified: Secondary | ICD-10-CM | POA: Diagnosis not present

## 2024-05-28 DIAGNOSIS — G629 Polyneuropathy, unspecified: Secondary | ICD-10-CM | POA: Diagnosis not present

## 2024-05-28 DIAGNOSIS — E871 Hypo-osmolality and hyponatremia: Secondary | ICD-10-CM | POA: Diagnosis not present

## 2024-05-28 DIAGNOSIS — S72091A Other fracture of head and neck of right femur, initial encounter for closed fracture: Secondary | ICD-10-CM | POA: Diagnosis not present

## 2024-05-28 DIAGNOSIS — I509 Heart failure, unspecified: Secondary | ICD-10-CM | POA: Diagnosis not present

## 2024-05-28 DIAGNOSIS — E222 Syndrome of inappropriate secretion of antidiuretic hormone: Secondary | ICD-10-CM | POA: Diagnosis not present

## 2024-05-28 DIAGNOSIS — S72001D Fracture of unspecified part of neck of right femur, subsequent encounter for closed fracture with routine healing: Secondary | ICD-10-CM | POA: Diagnosis not present

## 2024-05-28 DIAGNOSIS — Z7901 Long term (current) use of anticoagulants: Secondary | ICD-10-CM | POA: Diagnosis not present

## 2024-05-28 DIAGNOSIS — R2689 Other abnormalities of gait and mobility: Secondary | ICD-10-CM | POA: Diagnosis not present

## 2024-05-28 DIAGNOSIS — Z9181 History of falling: Secondary | ICD-10-CM | POA: Diagnosis not present

## 2024-05-28 DIAGNOSIS — Z5189 Encounter for other specified aftercare: Secondary | ICD-10-CM | POA: Diagnosis not present

## 2024-05-28 DIAGNOSIS — I959 Hypotension, unspecified: Secondary | ICD-10-CM | POA: Diagnosis not present

## 2024-05-28 DIAGNOSIS — I4891 Unspecified atrial fibrillation: Secondary | ICD-10-CM | POA: Diagnosis not present

## 2024-05-28 DIAGNOSIS — M6281 Muscle weakness (generalized): Secondary | ICD-10-CM | POA: Diagnosis not present

## 2024-05-31 DIAGNOSIS — G629 Polyneuropathy, unspecified: Secondary | ICD-10-CM | POA: Diagnosis not present

## 2024-05-31 DIAGNOSIS — S72091A Other fracture of head and neck of right femur, initial encounter for closed fracture: Secondary | ICD-10-CM | POA: Diagnosis not present

## 2024-05-31 DIAGNOSIS — I959 Hypotension, unspecified: Secondary | ICD-10-CM | POA: Diagnosis not present

## 2024-05-31 DIAGNOSIS — E871 Hypo-osmolality and hyponatremia: Secondary | ICD-10-CM | POA: Diagnosis not present

## 2024-05-31 DIAGNOSIS — Z7901 Long term (current) use of anticoagulants: Secondary | ICD-10-CM | POA: Diagnosis not present

## 2024-05-31 DIAGNOSIS — I509 Heart failure, unspecified: Secondary | ICD-10-CM | POA: Diagnosis not present

## 2024-06-01 DIAGNOSIS — M6281 Muscle weakness (generalized): Secondary | ICD-10-CM | POA: Diagnosis not present

## 2024-06-01 DIAGNOSIS — Z9181 History of falling: Secondary | ICD-10-CM | POA: Diagnosis not present

## 2024-06-08 ENCOUNTER — Other Ambulatory Visit (HOSPITAL_COMMUNITY): Payer: Self-pay | Admitting: Student

## 2024-06-08 ENCOUNTER — Ambulatory Visit (HOSPITAL_COMMUNITY)
Admission: RE | Admit: 2024-06-08 | Discharge: 2024-06-08 | Disposition: A | Discharge status: Home / Self Care | Source: Ambulatory Visit | Attending: Vascular Surgery

## 2024-06-08 DIAGNOSIS — E872 Acidosis, unspecified: Secondary | ICD-10-CM | POA: Diagnosis not present

## 2024-06-08 DIAGNOSIS — Z96641 Presence of right artificial hip joint: Secondary | ICD-10-CM | POA: Diagnosis not present

## 2024-06-08 DIAGNOSIS — Z87891 Personal history of nicotine dependence: Secondary | ICD-10-CM | POA: Diagnosis not present

## 2024-06-08 DIAGNOSIS — I110 Hypertensive heart disease with heart failure: Secondary | ICD-10-CM | POA: Diagnosis not present

## 2024-06-08 DIAGNOSIS — L03116 Cellulitis of left lower limb: Secondary | ICD-10-CM | POA: Diagnosis not present

## 2024-06-08 DIAGNOSIS — D638 Anemia in other chronic diseases classified elsewhere: Secondary | ICD-10-CM | POA: Diagnosis not present

## 2024-06-08 DIAGNOSIS — E869 Volume depletion, unspecified: Secondary | ICD-10-CM | POA: Diagnosis not present

## 2024-06-08 DIAGNOSIS — S72031D Displaced midcervical fracture of right femur, subsequent encounter for closed fracture with routine healing: Secondary | ICD-10-CM | POA: Diagnosis not present

## 2024-06-08 DIAGNOSIS — R2241 Localized swelling, mass and lump, right lower limb: Secondary | ICD-10-CM | POA: Diagnosis not present

## 2024-06-08 DIAGNOSIS — Z800 Family history of malignant neoplasm of digestive organs: Secondary | ICD-10-CM | POA: Diagnosis not present

## 2024-06-08 DIAGNOSIS — E871 Hypo-osmolality and hyponatremia: Secondary | ICD-10-CM | POA: Diagnosis not present

## 2024-06-08 DIAGNOSIS — I482 Chronic atrial fibrillation, unspecified: Secondary | ICD-10-CM | POA: Diagnosis not present

## 2024-06-08 DIAGNOSIS — L03115 Cellulitis of right lower limb: Secondary | ICD-10-CM | POA: Diagnosis not present

## 2024-06-08 DIAGNOSIS — Z7901 Long term (current) use of anticoagulants: Secondary | ICD-10-CM | POA: Diagnosis not present

## 2024-06-08 DIAGNOSIS — K219 Gastro-esophageal reflux disease without esophagitis: Secondary | ICD-10-CM | POA: Diagnosis not present

## 2024-06-08 DIAGNOSIS — Z8673 Personal history of transient ischemic attack (TIA), and cerebral infarction without residual deficits: Secondary | ICD-10-CM | POA: Diagnosis not present

## 2024-06-08 DIAGNOSIS — D72819 Decreased white blood cell count, unspecified: Secondary | ICD-10-CM | POA: Diagnosis not present

## 2024-06-08 DIAGNOSIS — G2581 Restless legs syndrome: Secondary | ICD-10-CM | POA: Diagnosis not present

## 2024-06-08 DIAGNOSIS — Z79899 Other long term (current) drug therapy: Secondary | ICD-10-CM | POA: Diagnosis not present

## 2024-06-08 DIAGNOSIS — I5033 Acute on chronic diastolic (congestive) heart failure: Secondary | ICD-10-CM | POA: Diagnosis not present

## 2024-06-08 DIAGNOSIS — E876 Hypokalemia: Secondary | ICD-10-CM | POA: Diagnosis not present

## 2024-06-10 ENCOUNTER — Encounter (HOSPITAL_COMMUNITY): Payer: Self-pay | Admitting: Emergency Medicine

## 2024-06-10 ENCOUNTER — Encounter: Payer: Self-pay | Admitting: Cardiology

## 2024-06-10 ENCOUNTER — Ambulatory Visit: Attending: Cardiology | Admitting: Cardiology

## 2024-06-10 ENCOUNTER — Other Ambulatory Visit: Payer: Self-pay

## 2024-06-10 ENCOUNTER — Inpatient Hospital Stay (HOSPITAL_COMMUNITY)
Admission: EM | Admit: 2024-06-10 | Discharge: 2024-06-14 | DRG: 602 | Disposition: A | Discharge status: Home / Self Care | Attending: Internal Medicine | Admitting: Internal Medicine

## 2024-06-10 VITALS — BP 122/58 | HR 78 | Ht 68.0 in | Wt 189.0 lb

## 2024-06-10 DIAGNOSIS — E869 Volume depletion, unspecified: Secondary | ICD-10-CM | POA: Diagnosis present

## 2024-06-10 DIAGNOSIS — Z79899 Other long term (current) drug therapy: Secondary | ICD-10-CM | POA: Diagnosis not present

## 2024-06-10 DIAGNOSIS — I110 Hypertensive heart disease with heart failure: Secondary | ICD-10-CM | POA: Diagnosis present

## 2024-06-10 DIAGNOSIS — E871 Hypo-osmolality and hyponatremia: Secondary | ICD-10-CM | POA: Diagnosis present

## 2024-06-10 DIAGNOSIS — D638 Anemia in other chronic diseases classified elsewhere: Secondary | ICD-10-CM | POA: Diagnosis present

## 2024-06-10 DIAGNOSIS — L03116 Cellulitis of left lower limb: Secondary | ICD-10-CM | POA: Diagnosis present

## 2024-06-10 DIAGNOSIS — Z87891 Personal history of nicotine dependence: Secondary | ICD-10-CM

## 2024-06-10 DIAGNOSIS — I482 Chronic atrial fibrillation, unspecified: Secondary | ICD-10-CM | POA: Diagnosis present

## 2024-06-10 DIAGNOSIS — N400 Enlarged prostate without lower urinary tract symptoms: Secondary | ICD-10-CM | POA: Diagnosis present

## 2024-06-10 DIAGNOSIS — K219 Gastro-esophageal reflux disease without esophagitis: Secondary | ICD-10-CM | POA: Diagnosis present

## 2024-06-10 DIAGNOSIS — L0390 Cellulitis, unspecified: Secondary | ICD-10-CM | POA: Diagnosis present

## 2024-06-10 DIAGNOSIS — Z800 Family history of malignant neoplasm of digestive organs: Secondary | ICD-10-CM

## 2024-06-10 DIAGNOSIS — G2581 Restless legs syndrome: Secondary | ICD-10-CM | POA: Diagnosis present

## 2024-06-10 DIAGNOSIS — R011 Cardiac murmur, unspecified: Secondary | ICD-10-CM

## 2024-06-10 DIAGNOSIS — L03115 Cellulitis of right lower limb: Principal | ICD-10-CM | POA: Diagnosis present

## 2024-06-10 DIAGNOSIS — Z96641 Presence of right artificial hip joint: Secondary | ICD-10-CM | POA: Diagnosis present

## 2024-06-10 DIAGNOSIS — I77819 Aortic ectasia, unspecified site: Secondary | ICD-10-CM | POA: Diagnosis not present

## 2024-06-10 DIAGNOSIS — D72819 Decreased white blood cell count, unspecified: Secondary | ICD-10-CM | POA: Diagnosis present

## 2024-06-10 DIAGNOSIS — I5033 Acute on chronic diastolic (congestive) heart failure: Secondary | ICD-10-CM | POA: Diagnosis present

## 2024-06-10 DIAGNOSIS — L03119 Cellulitis of unspecified part of limb: Secondary | ICD-10-CM | POA: Diagnosis not present

## 2024-06-10 DIAGNOSIS — E872 Acidosis, unspecified: Secondary | ICD-10-CM | POA: Diagnosis present

## 2024-06-10 DIAGNOSIS — E876 Hypokalemia: Secondary | ICD-10-CM | POA: Diagnosis present

## 2024-06-10 DIAGNOSIS — K7030 Alcoholic cirrhosis of liver without ascites: Secondary | ICD-10-CM | POA: Diagnosis present

## 2024-06-10 DIAGNOSIS — I10 Essential (primary) hypertension: Secondary | ICD-10-CM

## 2024-06-10 DIAGNOSIS — Z8673 Personal history of transient ischemic attack (TIA), and cerebral infarction without residual deficits: Secondary | ICD-10-CM

## 2024-06-10 DIAGNOSIS — Z7901 Long term (current) use of anticoagulants: Secondary | ICD-10-CM

## 2024-06-10 LAB — BASIC METABOLIC PANEL WITH GFR
Anion gap: 14 (ref 5–15)
BUN: <5 mg/dL — ABNORMAL LOW (ref 8–23)
CO2: 22 mmol/L (ref 22–32)
Calcium: 8.3 mg/dL — ABNORMAL LOW (ref 8.9–10.3)
Chloride: 96 mmol/L — ABNORMAL LOW (ref 98–111)
Creatinine, Ser: 0.6 mg/dL — ABNORMAL LOW (ref 0.61–1.24)
GFR, Estimated: >60 mL/min (ref >60)
Glucose, Bld: 113 mg/dL — ABNORMAL HIGH (ref 70–99)
Potassium: 3.1 mmol/L — ABNORMAL LOW (ref 3.5–5.1)
Sodium: 132 mmol/L — ABNORMAL LOW (ref 135–145)

## 2024-06-10 LAB — CBC WITH DIFFERENTIAL/PLATELET
Abs Immature Granulocytes: 0.07 K/uL (ref 0.00–0.07)
Basophils Absolute: 0.1 K/uL (ref 0.0–0.1)
Basophils Relative: 2 %
Eosinophils Absolute: 0.1 K/uL (ref 0.0–0.5)
Eosinophils Relative: 2 %
HCT: 30.8 % — ABNORMAL LOW (ref 39.0–52.0)
Hemoglobin: 10.2 g/dL — ABNORMAL LOW (ref 13.0–17.0)
Immature Granulocytes: 1 %
Lymphocytes Relative: 13 %
Lymphs Abs: 0.8 K/uL (ref 0.7–4.0)
MCH: 34.3 pg — ABNORMAL HIGH (ref 26.0–34.0)
MCHC: 33.1 g/dL (ref 30.0–36.0)
MCV: 103.7 fL — ABNORMAL HIGH (ref 80.0–100.0)
Monocytes Absolute: 0.8 K/uL (ref 0.1–1.0)
Monocytes Relative: 13 %
Neutro Abs: 4.1 K/uL (ref 1.7–7.7)
Neutrophils Relative %: 69 %
Platelets: 222 K/uL (ref 150–400)
RBC: 2.97 MIL/uL — ABNORMAL LOW (ref 4.22–5.81)
RDW: 21 % — ABNORMAL HIGH (ref 11.5–15.5)
WBC: 6 K/uL (ref 4.0–10.5)
nRBC: 0.3 % — ABNORMAL HIGH (ref 0.0–0.2)

## 2024-06-10 LAB — TYPE AND SCREEN
ABO/RH(D): O POS
Antibody Screen: NEGATIVE

## 2024-06-10 LAB — URINALYSIS, ROUTINE W REFLEX MICROSCOPIC
Bilirubin Urine: NEGATIVE
Glucose, UA: NEGATIVE mg/dL
Hgb urine dipstick: NEGATIVE
Ketones, ur: NEGATIVE mg/dL
Leukocytes,Ua: NEGATIVE
Nitrite: NEGATIVE
Protein, ur: NEGATIVE mg/dL
Specific Gravity, Urine: 1.002 — ABNORMAL LOW (ref 1.005–1.030)
pH: 8 (ref 5.0–8.0)

## 2024-06-10 LAB — LACTIC ACID, PLASMA
Lactic Acid, Venous: 2.8 mmol/L (ref 0.5–1.9)
Lactic Acid, Venous: 1.8 mmol/L (ref 0.5–1.9)
Lactic Acid, Venous: 2 mmol/L (ref 0.5–1.9)

## 2024-06-10 LAB — HEPATIC FUNCTION PANEL
ALT: 17 U/L (ref 0–44)
AST: 47 U/L — ABNORMAL HIGH (ref 15–41)
Albumin: 3 g/dL — ABNORMAL LOW (ref 3.5–5.0)
Alkaline Phosphatase: 160 U/L — ABNORMAL HIGH (ref 38–126)
Bilirubin, Direct: 0.5 mg/dL — ABNORMAL HIGH (ref 0.0–0.2)
Indirect Bilirubin: 0.8 mg/dL (ref 0.3–0.9)
Total Bilirubin: 1.3 mg/dL — ABNORMAL HIGH (ref 0.0–1.2)
Total Protein: 6.4 g/dL — ABNORMAL LOW (ref 6.5–8.1)

## 2024-06-10 LAB — ETHANOL: Alcohol, Ethyl (B): <15 mg/dL (ref <15)

## 2024-06-10 MED ORDER — THIAMINE MONONITRATE 100 MG PO TABS
100.0000 mg | ORAL_TABLET | Freq: Every day | ORAL | Status: DC
  Administered 2024-06-10 – 2024-06-14 (×5): 100 mg via ORAL
  Filled 2024-06-10 (×5): qty 1

## 2024-06-10 MED ORDER — ADULT MULTIVITAMIN W/MINERALS CH
1.0000 | ORAL_TABLET | Freq: Every day | ORAL | Status: DC
  Administered 2024-06-10 – 2024-06-14 (×5): 1 via ORAL
  Filled 2024-06-10 (×5): qty 1

## 2024-06-10 MED ORDER — FOLIC ACID 1 MG PO TABS
1.0000 mg | ORAL_TABLET | Freq: Every day | ORAL | Status: DC
  Administered 2024-06-10 – 2024-06-14 (×5): 1 mg via ORAL
  Filled 2024-06-10 (×5): qty 1

## 2024-06-10 MED ORDER — ALBUMIN HUMAN 25 % IV SOLN
25.0000 g | Freq: Four times a day (QID) | INTRAVENOUS | Status: AC
  Administered 2024-06-10 – 2024-06-11 (×2): 25 g via INTRAVENOUS
  Filled 2024-06-10 (×2): qty 100

## 2024-06-10 MED ORDER — ONDANSETRON HCL 4 MG/2ML IJ SOLN: 4.0000 mg | Freq: Four times a day (QID) | INTRAMUSCULAR | Status: DC | PRN

## 2024-06-10 MED ORDER — DILTIAZEM HCL ER COATED BEADS 180 MG PO CP24: 180.0000 mg | ORAL_CAPSULE | Freq: Every day | ORAL | Status: DC

## 2024-06-10 MED ORDER — LACTATED RINGERS IV SOLN: INTRAVENOUS | Status: DC

## 2024-06-10 MED ORDER — ROPINIROLE HCL 0.5 MG PO TABS
1.0000 mg | ORAL_TABLET | Freq: Every day | ORAL | Status: DC
  Administered 2024-06-10 – 2024-06-13 (×4): 1 mg via ORAL
  Filled 2024-06-10 (×4): qty 2

## 2024-06-10 MED ORDER — DILTIAZEM HCL ER COATED BEADS 120 MG PO CP24
120.0000 mg | ORAL_CAPSULE | Freq: Every day | ORAL | Status: DC
  Administered 2024-06-11 – 2024-06-14 (×4): 120 mg via ORAL
  Filled 2024-06-10 (×4): qty 1

## 2024-06-10 MED ORDER — HYDRALAZINE HCL 20 MG/ML IJ SOLN: 5.0000 mg | INTRAMUSCULAR | Status: DC | PRN

## 2024-06-10 MED ORDER — OXYCODONE HCL 5 MG PO TABS
2.5000 mg | ORAL_TABLET | ORAL | Status: DC | PRN
  Administered 2024-06-11 – 2024-06-14 (×4): 5 mg via ORAL
  Filled 2024-06-10 (×5): qty 1

## 2024-06-10 MED ORDER — LORAZEPAM 2 MG/ML IJ SOLN: 1.0000 mg | INTRAMUSCULAR | Status: AC | PRN

## 2024-06-10 MED ORDER — SODIUM CHLORIDE 0.9 % IV BOLUS
1000.0000 mL | Freq: Once | INTRAVENOUS | Status: AC
  Administered 2024-06-10: 1000 mL via INTRAVENOUS

## 2024-06-10 MED ORDER — SODIUM CHLORIDE 0.9 % IV SOLN
2.0000 g | Freq: Three times a day (TID) | INTRAVENOUS | Status: DC
  Administered 2024-06-10 – 2024-06-13 (×8): 2 g via INTRAVENOUS
  Filled 2024-06-10 (×9): qty 12.5

## 2024-06-10 MED ORDER — ACETAMINOPHEN 325 MG PO TABS
650.0000 mg | ORAL_TABLET | Freq: Four times a day (QID) | ORAL | Status: DC | PRN
  Administered 2024-06-12 – 2024-06-14 (×3): 650 mg via ORAL
  Filled 2024-06-10 (×3): qty 2

## 2024-06-10 MED ORDER — SODIUM CHLORIDE 0.9 % IV SOLN
1.0000 g | Freq: Once | INTRAVENOUS | Status: AC
  Administered 2024-06-10: 1 g via INTRAVENOUS
  Filled 2024-06-10: qty 10

## 2024-06-10 MED ORDER — METOPROLOL SUCCINATE ER 25 MG PO TB24: 12.5000 mg | ORAL_TABLET | Freq: Every day | ORAL | Status: DC

## 2024-06-10 MED ORDER — LORAZEPAM 1 MG PO TABS: 1.0000 mg | ORAL_TABLET | ORAL | Status: AC | PRN

## 2024-06-10 MED ORDER — VANCOMYCIN HCL 1250 MG/250ML IV SOLN
1250.0000 mg | Freq: Two times a day (BID) | INTRAVENOUS | Status: DC
  Administered 2024-06-11 – 2024-06-13 (×5): 1250 mg via INTRAVENOUS
  Filled 2024-06-10 (×5): qty 250

## 2024-06-10 MED ORDER — ONDANSETRON HCL 4 MG PO TABS: 4.0000 mg | ORAL_TABLET | Freq: Four times a day (QID) | ORAL | Status: DC | PRN

## 2024-06-10 MED ORDER — POTASSIUM CHLORIDE 10 MEQ/100ML IV SOLN
10.0000 meq | INTRAVENOUS | Status: AC
  Administered 2024-06-10 (×3): 10 meq via INTRAVENOUS
  Filled 2024-06-10 (×3): qty 100

## 2024-06-10 MED ORDER — NICOTINE 14 MG/24HR TD PT24: 14.0000 mg | MEDICATED_PATCH | Freq: Every day | TRANSDERMAL | Status: DC | PRN

## 2024-06-10 MED ORDER — MORPHINE SULFATE (PF) 4 MG/ML IV SOLN
4.0000 mg | Freq: Once | INTRAVENOUS | Status: AC
  Administered 2024-06-10: 4 mg via INTRAVENOUS
  Filled 2024-06-10: qty 1

## 2024-06-10 MED ORDER — VANCOMYCIN HCL 1500 MG/300ML IV SOLN: 1500.0000 mg | Freq: Two times a day (BID) | INTRAVENOUS | Status: DC

## 2024-06-10 MED ORDER — PANTOPRAZOLE SODIUM 40 MG IV SOLR
40.0000 mg | Freq: Two times a day (BID) | INTRAVENOUS | Status: DC
  Administered 2024-06-10 – 2024-06-11 (×2): 40 mg via INTRAVENOUS
  Filled 2024-06-10 (×2): qty 10

## 2024-06-10 MED ORDER — APIXABAN 5 MG PO TABS
5.0000 mg | ORAL_TABLET | Freq: Two times a day (BID) | ORAL | Status: DC
  Administered 2024-06-10 – 2024-06-14 (×8): 5 mg via ORAL
  Filled 2024-06-10 (×8): qty 1

## 2024-06-10 MED ORDER — MORPHINE SULFATE (PF) 2 MG/ML IV SOLN: 2.0000 mg | INTRAVENOUS | Status: DC | PRN

## 2024-06-10 MED ORDER — VANCOMYCIN HCL 1500 MG/300ML IV SOLN
1500.0000 mg | Freq: Once | INTRAVENOUS | Status: AC
  Administered 2024-06-10: 1500 mg via INTRAVENOUS
  Filled 2024-06-10 (×2): qty 300

## 2024-06-10 MED ORDER — ONDANSETRON HCL 4 MG/2ML IJ SOLN
4.0000 mg | Freq: Once | INTRAMUSCULAR | Status: AC
  Administered 2024-06-10: 4 mg via INTRAVENOUS
  Filled 2024-06-10: qty 2

## 2024-06-10 MED ORDER — ACETAMINOPHEN 650 MG RE SUPP: 650.0000 mg | Freq: Four times a day (QID) | RECTAL | Status: DC | PRN

## 2024-06-10 NOTE — H&P (Addendum)
 History and Physical    Patient: Jeffrey Randolph FMW:[id number] DOB: 16-Oct-1955 DOA: 06/10/2024 DOS: the patient was seen and examined on 06/10/2024 . PCP: Montey Lot, PA-C  Patient coming from: Home Chief complaint: Chief Complaint  Patient presents with   Leg Swelling   HPI:  Jeffrey Randolph is a 68 y.o. male with past medical history  of   HFpEF, atrial fibrillation on eliquis  , history of CVA, neuropathy, alcoholic liver cirrhosis, and Chronic hyponatremia last admitted on 8/15/ 2025 for right hip arthroplasty after a fall and trip over a cat in August 2025 being admitted today for progressive redness in both legs worse on right.  Patient was seen for follow-up in orthopedic clinic visit and found to have increased redness and swelling around the surgical incision site with concerns of infection was given doxycycline.  However it has progressed and when seen by his heart doctor today  for his a.fib he was recommended to come to the emergency room.  Patient today states that he has not had anything to drink about 3 days ago.  He is in no distress at bedside does not complain of any pain has a skin cancer on his nose and is seeing dermatology for states that he was given a medication so he does not have to have surgery.   ED Course:  Vital signs in the ED were notable for the following:  Vitals:   06/10/24 1500 06/10/24 1515 06/10/24 1830 06/10/24 1840  BP: (!) 113/59 (!) 111/59 (!) 109/50   Pulse: 70 65 60   Temp:    98.7 F (37.1 C)  Resp: 15 (!) 21  18  SpO2: 100% 100% 100%   TempSrc:    Oral  >>ED evaluation thus far shows: Initial CMP shows sodium 132 potassium 3.1 glucose 113 normal kidney function, alk phos 160 AST 47 direct bili 0.5 and total bili 1.3. Lactic acid of 2.8. CBC showing white count of 6 hemoglobin 10.2 platelets of 222. Urinalysis shows straw-colored clear urine with negative leukocytes. EKG today shows A-fib at 78 with, QTc 45, RSR pattern in V1 suggestive of right  ventricular conduction delay.  >>While in the ED patient received the following: Medications  potassium chloride  10 mEq in 100 mL IVPB (0 mEq Intravenous Stopped 06/10/24 1556)  morphine  (PF) 4 MG/ML injection 4 mg (4 mg Intravenous Given 06/10/24 1247)  ondansetron  (ZOFRAN ) injection 4 mg (4 mg Intravenous Given 06/10/24 1247)  sodium chloride  0.9 % bolus 1,000 mL (0 mLs Intravenous Stopped 06/10/24 1556)  cefTRIAXone  (ROCEPHIN ) 1 g in sodium chloride  0.9 % 100 mL IVPB (0 g Intravenous Stopped 06/10/24 1522)   Review of Systems  Cardiovascular:  Positive for leg swelling.  Skin:  Positive for rash.   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   Past Surgical History:  Procedure Laterality Date  ESOPHAGOGASTRODUODENOSCOPY (EGD) WITH PROPOFOL  N/A 01/06/2023   Procedure: ESOPHAGOGASTRODUODENOSCOPY (EGD) WITH PROPOFOL ;  Surgeon: [name], MD;  Location: [facility] ENDOSCOPY;  Service: Gastroenterology;  Laterality: N/A;  Wants close to 9 AM arrival   LUMBAR LAMINECTOMY/DECOMPRESSION MICRODISCECTOMY Right 01/09/2015   Procedure: LUMBAR LAMINECTOMY/DECOMPRESSION MICRODISCECTOMY 1 LEVEL;  Surgeon: [name], MD;  Location: [facility] NEURO ORS;  Service: Neurosurgery;  Laterality: Right;  LUMBAR LAMINECTOMY/DECOMPRESSION MICRODISCECTOMY 1 LEVEL RIGHT LUMBAR 2-3   TONSILLECTOMY     TOTAL HIP ARTHROPLASTY Right 05/21/2024   Procedure: ARTHROPLASTY, HIP, TOTAL, ANTERIOR  APPROACH;  Surgeon: Kendal Franky SQUIBB, MD;  Location: [facility] OR;  Service: Orthopedics;  Laterality: Right;    reports that he has quit smoking. His smoking use included cigarettes. He has never used smokeless tobacco. He reports current alcohol use. He reports that he does not use drugs. No Known Allergies Family History  Problem Relation Age of Onset   Pancreatic cancer Mother    Prior to Admission medications   Medication Sig Start Date End Date Taking? Authorizing Provider  acetaminophen  (TYLENOL ) 325 MG tablet Take 2 tablets (650 mg total) by mouth every 6 (six) hours as needed for mild pain (pain score 1-3), fever or headache. 05/24/24   Danton Lauraine LABOR, PA-C  apixaban  (ELIQUIS ) 5 MG TABS tablet Take 1 tablet (5 mg total) by mouth 2 (two) times daily. 05/25/24   Samtani, Jai-Gurmukh, MD  chlorhexidine  (HIBICLENS ) 4 % external liquid Apply 15 mLs (1 Application total) topically as directed for 30 doses. Use as directed daily for 5 days every other week for 6 weeks. 05/21/24   Danton Lauraine LABOR, PA-C  cholecalciferol  (CHOLECALCIFEROL ) 25 MCG tablet Take 1 tablet (1,000 Units total) by mouth daily. 05/24/24 08/22/24  Danton Lauraine LABOR, PA-C  cyanocobalamin  (VITAMIN B12) 1000 MCG tablet Take 1 tablet (1,000 mcg total) by mouth daily. Patient taking differently: Take 2,000 mcg by mouth daily. 04/16/24   Elgergawy, Brayton RAMAN, MD  diltiazem  (CARDIZEM  CD) 180 MG 24 hr capsule Take 1 capsule (180 mg total) by mouth daily. 05/26/24   Samtani, Jai-Gurmukh, MD  doxycycline (VIBRA-TABS) 100 MG tablet Take 100 mg by mouth 2 (two) times daily. 06/08/24   [provider]  lactulose  (CHRONULAC ) 10 GM/15ML solution Take 20 g by mouth 2 (two) times daily.    [provider]  methocarbamol  (ROBAXIN ) 500 MG tablet Take 500 mg by mouth daily as needed for muscle spasms.    [provider]  metoprolol  succinate (TOPROL -XL) 25 MG 24 hr tablet Take 0.5 tablets (12.5 mg total) by mouth daily. 05/26/24    Samtani, Jai-Gurmukh, MD  mupirocin  ointment (BACTROBAN ) 2 % Place 1 Application into the nose 2 (two) times daily for 60 doses. Use as directed 2 times daily for 5 days every other week for 6 weeks. 05/21/24 06/20/24  Danton Lauraine LABOR, PA-C  omeprazole (PRILOSEC) 40 MG capsule Take 40 mg by mouth daily.    [provider]  oxyCODONE  (OXY IR/ROXICODONE ) 5 MG immediate release tablet Take 0.5-1 tablets (2.5-5 mg total) by mouth every 4 (four) hours as needed for moderate pain (pain score 4-6) or severe pain (pain score 7-10) (2.5 mg pain score 4-6, 5 mg pain score 7-10). 05/24/24   Danton Lauraine LABOR, PA-C  pregabalin  (LYRICA ) 200 MG capsule Take 1 capsule (200 mg total) by mouth 3 (three) times daily. Patient taking differently: Take 200 mg by mouth daily as needed (pain). 05/28/23   Ghimire, Donalda HERO, MD  rOPINIRole  (REQUIP ) 0.5 MG  tablet Take 1 mg by mouth at bedtime.    [provider]                                                                                 Vitals:   06/10/24 1500 06/10/24 1515 06/10/24 1830 06/10/24 1840  BP: (!) 113/59 (!) 111/59 (!) 109/50   Pulse: 70 65 60   Resp: 15 (!) 21  18  Temp:    98.7 F (37.1 C)  TempSrc:    Oral  SpO2: 100% 100% 100%    Physical Exam Vitals reviewed.  Constitutional:      General: He is not in acute distress.    Appearance: He is not ill-appearing.  HENT:     Head: Normocephalic.  Eyes:     Extraocular Movements: Extraocular movements intact.  Cardiovascular:     Rate and Rhythm: Normal rate. Rhythm irregular.     Heart sounds: Normal heart sounds.  Pulmonary:     Breath sounds: Normal breath sounds.  Abdominal:     General: There is no distension.     Palpations: Abdomen is soft.     Tenderness: There is no abdominal tenderness.  Musculoskeletal:     Right lower leg: Swelling present. Edema present.     Left lower leg: Swelling present. Edema present.       Legs:  Skin:    Findings: Erythema present.   Neurological:     General: No focal deficit present.     Mental Status: He is alert and oriented to person, place, and time.     Labs on Admission: I have personally reviewed following labs and imaging studies CBC: Recent Labs  Lab 06/10/24 1210  WBC 6.0  NEUTROABS 4.1  HGB 10.2*  HCT 30.8*  MCV 103.7*  PLT 222   Basic Metabolic Panel: Recent Labs  Lab 06/10/24 1210  NA 132*  K 3.1*  CL 96*  CO2 22  GLUCOSE 113*  BUN <5*  CREATININE 0.60*  CALCIUM 8.3*   GFR: Estimated Creatinine Clearance: 94.1 mL/min (A) (by C-G formula based on SCr of 0.6 mg/dL (L)). Liver Function Tests: Recent Labs  Lab 06/10/24 1209  AST 47*  ALT 17  ALKPHOS 160*  BILITOT 1.3*  PROT 6.4*  ALBUMIN  3.0*   Cardiac Enzymes: No results for input(s): CKTOTAL, CKMB, CKMBINDEX, TROPONINI in the last 168 hours. BNP (last 3 results) No results for input(s): PROBNP in the last 8760 hours. HbA1C: No results for input(s): HGBA1C in the last 72 hours. CBG: No results for input(s): GLUCAP in the last 168 hours. Lipid Profile: No results for input(s): CHOL, HDL, LDLCALC, TRIG, CHOLHDL, LDLDIRECT in the last 72 hours. Thyroid  Function Tests: No results for input(s): TSH, T4TOTAL, FREET4, T3FREE, THYROIDAB in the last 72 hours. Anemia Panel: No results for input(s): VITAMINB12, FOLATE, FERRITIN, TIBC, IRON, RETICCTPCT in the last 72 hours. Urine analysis:    Component Value Date/Time   COLORURINE STRAW (A) 06/10/2024 1020   APPEARANCEUR CLEAR 06/10/2024 1020   LABSPEC 1.002 (L) 06/10/2024 1020   PHURINE 8.0 06/10/2024 1020   GLUCOSEU NEGATIVE 06/10/2024 1020   HGBUR NEGATIVE 06/10/2024 1020   BILIRUBINUR NEGATIVE  06/10/2024 1020   KETONESUR NEGATIVE 06/10/2024 1020   PROTEINUR NEGATIVE 06/10/2024 1020   UROBILINOGEN 0.2 08/03/2011 1937   NITRITE NEGATIVE 06/10/2024 1020   LEUKOCYTESUR NEGATIVE 06/10/2024 1020   Radiological Exams on  Admission: No results found. Data Reviewed: Relevant notes from primary care and specialist visits, past discharge summaries as available in EHR, including Care Everywhere . Prior diagnostic testing as pertinent to current admission diagnoses, Updated medications and problem lists for reconciliation .ED course, including vitals, labs, imaging, treatment and response to treatment,Triage notes, nursing and pharmacy notes and ED provider's notes.Notable results as noted in HPI.Discussed case with EDMD/ ED APP/ or Specialty MD on call and as needed.  Assessment & Plan  >> Cellulitis of bilateral lower extremities: Will start patient on broad-spectrum IV antibiotics covering MRSA gram-positive gram-negative.  Patient was recently treated with doxycycline however due to treatment failure we will start with aggressive management and broad coverage.   >> Alcohol abuse: Alcohol withdrawal precaution med surg order set used.  Aspiration and fall precautions.  >> A-fib/history of stroke: Patient is currently in A-fib rate controlled, will continue patient's Eliquis .  Patient continued on metoprolol  in a.m.as deemed appropriate , and diltiazem  in am at reduced dose of 120 mg as pt's BP is soft.     >> Chronic hyponatremia: Attributed to patient's alcohol abuse, serum osmolality urine osmolality and additional hyponatremia evaluation blood work ordered.    Latest Ref Rng & Units 06/10/2024   12:10 PM 05/25/2024    6:42 AM 05/24/2024    5:18 AM  BMP  Glucose 70 - 99 mg/dL 886  894  893   BUN 8 - 23 mg/dL <5  <5  <5   Creatinine 0.61 - 1.24 mg/dL 9.39  9.51  9.50   Sodium 135 - 145 mmol/L 132  129  127   Potassium 3.5 - 5.1 mmol/L 3.1  4.0  3.8   Chloride 98 - 111 mmol/L 96  96  97   CO2 22 - 32 mmol/L 22  25  22    Calcium 8.9 - 10.3 mg/dL 8.3  8.3  8.0    Intake/Output Summary (Last 24 hours) at 06/10/2024 1948 Last data filed at 06/10/2024 1556 Gross per 24 hour  Intake 1295.46 ml  Output --  Net  1295.46 ml     >> Mild AST elevation: Attribute to patient's alcohol intake along with lactic acidosis.  Patient nontoxic-appearing.    Latest Ref Rng & Units 06/10/2024   12:09 PM 05/25/2024    6:42 AM 05/21/2024    6:57 AM  Hepatic Function  Total Protein 6.5 - 8.1 g/dL 6.4  5.7  6.7   Albumin  3.5 - 5.0 g/dL 3.0  2.8  3.3   AST 15 - 41 U/L 47  30  45   ALT 0 - 44 U/L 17  19  23    Alk Phosphatase 38 - 126 U/L 160  81  127   Total Bilirubin 0.0 - 1.2 mg/dL 1.3  1.5  1.5   Bilirubin, Direct 0.0 - 0.2 mg/dL 0.5        >> Anemia: History of anemia, worse on the 17th which I suspect may have been postoperatively, currently hemoglobin of 10.2 stable, will certainly follow obtain a stool guaiac as patient is on anticoagulation IV PPIs.    Latest Ref Rng & Units 06/10/2024   12:10 PM 05/25/2024    6:42 AM 05/24/2024    5:18 AM  CBC  WBC 4.0 -  10.5 K/uL 6.0  6.3  6.2   Hemoglobin 13.0 - 17.0 g/dL 89.7  8.7  7.9   Hematocrit 39.0 - 52.0 % 30.8  25.2  22.5   Platelets 150 - 400 K/uL 222  168  141    >>Hypokalemia: Replaced in ed.    DVT prophylaxis:  Eliquis  Consults:  None .  Advance Care Planning:    Code Status: Full Code   Family Communication:  None  Disposition Plan:  Home.  Severity of Illness: The appropriate patient status for this patient is INPATIENT. Inpatient status is judged to be reasonable and necessary in order to provide the required intensity of service to ensure the patient's safety. The patient's presenting symptoms, physical exam findings, and initial radiographic and laboratory data in the context of their chronic comorbidities is felt to place them at high risk for further clinical deterioration. Furthermore, it is not anticipated that the patient will be medically stable for discharge from the hospital within 2 midnights of admission.   * I certify that at the point of admission it is my clinical judgment that the patient will require inpatient hospital  care spanning beyond 2 midnights from the point of admission due to high intensity of service, high risk for further deterioration and high frequency of surveillance required.*  Unresulted Labs (From admission, onward)     Start     Ordered   06/11/24 0500  CBC with Differential/Platelet  Tomorrow morning,   R        06/10/24 1939   06/11/24 0500  Comprehensive metabolic panel with GFR  Tomorrow morning,   R        06/10/24 1939   06/11/24 0500  Magnesium   Tomorrow morning,   R        06/10/24 1939   06/11/24 0500  Phosphorus  Tomorrow morning,   R        06/10/24 1939   06/11/24 0500  Occult blood card to lab, stool  Daily,   R      06/10/24 1939   06/10/24 1945  Lactic acid, plasma  (Lactic Acid)  STAT Now then every 3 hours,   R (with STAT occurrences)      06/10/24 1944   06/10/24 1939  Type and screen  Once,   R        06/10/24 1939   06/10/24 1117  Lactic acid, plasma  Now then every 2 hours,   R      06/10/24 1116            Meds ordered this encounter  Medications   morphine  (PF) 4 MG/ML injection 4 mg   ondansetron  (ZOFRAN ) injection 4 mg   sodium chloride  0.9 % bolus 1,000 mL   potassium chloride  10 mEq in 100 mL IVPB   cefTRIAXone  (ROCEPHIN ) 1 g in sodium chloride  0.9 % 100 mL IVPB    Antibiotic Indication::   Cellulitis   lactated ringers  infusion   OR Linked Order Group    acetaminophen  (TYLENOL ) tablet 650 mg    acetaminophen  (TYLENOL ) suppository 650 mg   morphine  (PF) 2 MG/ML injection 2 mg   OR Linked Order Group    ondansetron  (ZOFRAN ) tablet 4 mg    ondansetron  (ZOFRAN ) injection 4 mg   nicotine  (NICODERM CQ  - dosed in mg/24 hours) patch 14 mg   hydrALAZINE  (APRESOLINE ) injection 5 mg   thiamine  (VITAMIN B1) tablet 100 mg   OR Linked Order Group    LORazepam  (  ATIVAN ) tablet 1-4 mg     CIWA-AR < 5 =:   0 mg     CIWA-AR 5 -10 =:   1 mg     CIWA-AR 11 -15 =:   2 mg     CIWA-AR 16 -20 =:   3 mg     CIWA-AR 16 -20 =:   Recheck CIWA-AR in 1 hour; if >  20 notify MD     CIWA-AR > 20 =:   4 mg     CIWA-AR > 20 =:   Call Rapid Response    LORazepam  (ATIVAN ) injection 1-4 mg     CIWA-AR < 5 =:   0 mg     CIWA-AR 5 -10 =:   1 mg     CIWA-AR 11 -15 =:   2 mg     CIWA-AR 16 -20 =:   3 mg     CIWA-AR 16 -20 =:   Recheck CIWA-AR in 1 hour; if > 20 notify MD     CIWA-AR > 20 =:   4 mg     CIWA-AR > 20 =:   Call Rapid Response   folic acid  (FOLVITE ) tablet 1 mg   multivitamin with minerals tablet 1 tablet   pantoprazole  (PROTONIX ) injection 40 mg   oxyCODONE  (Oxy IR/ROXICODONE ) immediate release tablet 2.5-5 mg    Refill:  0   rOPINIRole  (REQUIP ) tablet 1 mg   apixaban  (ELIQUIS ) tablet 5 mg   DISCONTD: diltiazem  (CARDIZEM  CD) 24 hr capsule 180 mg   DISCONTD: metoprolol  succinate (TOPROL -XL) 24 hr tablet 12.5 mg   albumin  human 25 % solution 25 g   diltiazem  (CARDIZEM  CD) 24 hr capsule 120 mg   ceFEPIme  (MAXIPIME ) 2 g in sodium chloride  0.9 % 100 mL IVPB    Antibiotic Indication::   Other Indication (list below)    Other Indication::   cellulitis   vancomycin  (VANCOREADY) IVPB 1500 mg/300 mL    Indication::   Cellulitis     Orders Placed This Encounter  Procedures   Urinalysis, Routine w reflex microscopic -Urine, Clean Catch   CBC with Differential   Basic metabolic panel   Lactic acid, plasma   Hepatic function panel   Ethanol   CBC with Differential/Platelet   Comprehensive metabolic panel with GFR   Magnesium    Phosphorus   Occult blood card to lab, stool   Lactic acid, plasma   Diet Heart Room service appropriate? Yes; Fluid consistency: Thin   Apply Cellulitis Care Plan   Vital signs   Notify physician (specify)   Refer to Sidebar Report Mobility Protocol for Adult Inpatient   Initiate Adult Central Line Maintenance and Catheter Protocol for patients with central line (CVC, PICC, Port, Hemodialysis, Trialysis)   If patient diabetic or glucose greater than 140 notify physician for Sliding Scale Insulin Orders   Intake and  Output   Neurovascular checks   Initiate CHG Protocol   Do not place and if present remove PureWick   Initiate Oral Care Protocol   Initiate Carrier Fluid Protocol   RN may order General Admission PRN Orders utilizing General Admission PRN medications (through manage orders) for the following patient needs: allergy symptoms (Claritin), cold sores (Carmex), cough (Robitussin DM), eye irritation (Liquifilm Tears), hemorrhoids (Tucks), indigestion (Maalox), minor skin irritation (Hydrocortisone Cream), muscle pain Lucienne Gay), nose irritation (saline nasal spray) and sore throat (Chloraseptic spray).   Cardiac Monitoring Continuous x 48 hours Indications for use: Other; Other indications for use:  alcohol abuse   Ambulate with assistance   Vital signs every 6 hours X 48 hours, then per unit protocol   Refer to Sidebar Report for reference: ETOH Withdrawal Guidelines   Clinical Institute Withdrawal Assessent (CIWA)   If Ativan  given, reassess Clinical Institute Withdrawal Assessment (CIWA) with blood pressure and pulse rate within 1 hour of Ativan  administration   Notify Pharmacy to change IV Ativan  to PO if tolerating POs well.   Notify physician (specify)   Full code   Consult to orthopedic surgery   Consult to hospitalist   vancomycin  per pharmacy consult   ceFEPime  (MAXIPIME ) per pharmacy consult            Consult to Transition of Care Team   Pulse oximetry check with vital signs   Oxygen therapy Mode or (Route): Nasal cannula; Liters Per Minute: 2; Keep O2 saturation between: greater than 92 %   Type and screen   Insert peripheral IV   Admit to Inpatient (patient's expected length of stay will be greater than 2 midnights or inpatient only procedure)   Aspiration precautions   Fall precautions    Author: Mario LULLA Blanch, MD 12 pm- 8 pm. [facility]. 06/10/2024 7:48 PM Please note for any communication after hours contact [facility] Assigned provider on call on Amion.

## 2024-06-10 NOTE — Progress Notes (Signed)
 Cardiology CONSULT Note    Date:  06/10/2024   ID:  Jeffrey Randolph, DOB 1956/05/13, MRN [id number]  PCP:  Jeffrey Lot, PA-C  Cardiologist:  Wilbert Bihari, MD   Chief Complaint  Patient presents with   New Patient (Initial Visit)    Atrial fibrillation    Patient Profile: Jeffrey Randolph is a 68 y.o. male who is being seen today for the evaluation of new onset atrial fibrillation at the request of Jeffrey Randolph, [place].  History of Present Illness:  Jeffrey Randolph is a 68 y.o. male who is being seen today for the evaluation of new onset atrial fibrillation at the request of Jeffrey Lot, PA-C.  This is a 68yo male with a hx of cirrhosis, CVA, GERD, hypertension, restless legs who was hospitalized in August 2025 with acute hip fracture after a mechanical fall.  He has a history of chronic alcohol use and chronic hyponatremia as well as alcoholic liver cirrhosis.  He has a history of atrial fibrillation diagnosed in 2020 at which time he underwent cardioversion in March 2020.  He has been in chronic atrial fibrillation for several years now.  He has not been compliant with follow-up.  He last saw me on 06/15/2019.  He is on Eliquis  for anticoagulation.  He is now referred back by his PCP to reestablish cardiac care.  He denies any chest pain or pressure, shortness of breath, DOE, PND, orthopnea, dizziness, palpitations or syncope. He has had swelling and erythema of his RLE and saw his PCP yesterday and LE venous doppler showed no DVT.  He was started on antibx 2 days ago. Yesterday he started getting erythmea all the way up to his knee on the right.   Past Medical History:  Diagnosis Date   Atrial fibrillation [facility])    Atrial fibrillation with rapid ventricular response (HCC)    BPH (benign prostatic hyperplasia)    Cirrhosis (HCC)    CVA (cerebral infarction)    Dysrhythmia    GERD (gastroesophageal reflux disease)    Hemiplegia due to old stroke (HCC)    History of stroke 05/26/2019    HNP (herniated nucleus pulposus), lumbar 01/09/2015   Hypertension    Hypokalemia    Hypomagnesemia 05/26/2019   Hyponatremia    Neuropathy    RLS (restless legs syndrome)    Stroke (HCC)    Left foot always feel 2 x larger, walks with a limp.    Past Surgical History:  Procedure Laterality Date   ESOPHAGOGASTRODUODENOSCOPY (EGD) WITH PROPOFOL  N/A 01/06/2023   Procedure: ESOPHAGOGASTRODUODENOSCOPY (EGD) WITH PROPOFOL ;  Surgeon: [name], MD;  Location: [facility] ENDOSCOPY;  Service: Gastroenterology;  Laterality: N/A;  Wants close to 9 AM arrival   LUMBAR LAMINECTOMY/DECOMPRESSION MICRODISCECTOMY Right 01/09/2015   Procedure: LUMBAR LAMINECTOMY/DECOMPRESSION MICRODISCECTOMY 1 LEVEL;  Surgeon: [name], MD;  Location: [facility] NEURO ORS;  Service: Neurosurgery;  Laterality: Right;  LUMBAR LAMINECTOMY/DECOMPRESSION MICRODISCECTOMY 1 LEVEL RIGHT LUMBAR 2-3   TONSILLECTOMY     TOTAL HIP ARTHROPLASTY Right 05/21/2024   Procedure: ARTHROPLASTY, HIP, TOTAL, ANTERIOR APPROACH;  Surgeon: Kendal Franky SQUIBB, MD;  Location: [facility] OR;  Service: Orthopedics;  Laterality: Right;    Current Medications: Current Meds  Medication Sig   acetaminophen  (TYLENOL ) 325 MG tablet Take 2 tablets (650 mg total) by mouth every 6 (six) hours as needed for mild pain (pain score 1-3), fever or headache.   apixaban  (ELIQUIS ) 5 MG TABS tablet Take 1 tablet (5 mg total) by mouth 2 (two) times  daily.   chlorhexidine  (HIBICLENS ) 4 % external liquid Apply 15 mLs (1 Application total) topically as directed for 30 doses. Use as directed daily for 5 days every other week for 6 weeks.   cholecalciferol  (CHOLECALCIFEROL ) 25 MCG tablet Take 1 tablet (1,000 Units total) by mouth daily.   cyanocobalamin  (VITAMIN B12) 1000 MCG tablet Take 1 tablet (1,000 mcg total) by mouth daily. (Patient taking differently: Take 2,000 mcg by mouth daily.)   diltiazem  (CARDIZEM  CD) 180 MG 24 hr capsule Take 1 capsule (180 mg total) by mouth daily.   doxycycline  (VIBRA-TABS) 100 MG tablet Take 100 mg by mouth 2 (two) times daily.   lactulose  (CHRONULAC ) 10 GM/15ML solution Take 20 g by mouth 2 (two) times daily.   methocarbamol  (ROBAXIN ) 500 MG tablet Take 500 mg by mouth daily as needed for muscle spasms.   metoprolol  succinate (TOPROL -XL) 25 MG 24 hr tablet Take 0.5 tablets (12.5 mg total) by mouth daily.   mupirocin  ointment (BACTROBAN ) 2 % Place 1 Application into the nose 2 (two) times daily for 60 doses. Use as directed 2 times daily for 5 days every other week for 6 weeks.   omeprazole (PRILOSEC) 40 MG capsule Take 40 mg by mouth daily.   oxyCODONE  (OXY IR/ROXICODONE ) 5 MG immediate release tablet Take 0.5-1 tablets (2.5-5 mg total) by mouth every 4 (four) hours as needed for moderate pain (pain score 4-6) or severe pain (pain score 7-10) (2.5 mg pain score 4-6, 5 mg pain score 7-10).   pregabalin  (LYRICA ) 200 MG capsule Take 1 capsule (200 mg total) by mouth 3 (three) times daily. (Patient taking differently: Take 200 mg by mouth daily as needed (pain).)   rOPINIRole  (REQUIP ) 0.5 MG tablet Take 1 mg by mouth at bedtime.    Allergies:   Patient has no known allergies.   Social History   Socioeconomic History   Marital status: Single    Spouse name: Not on file   Number of children: Not on file   Years of education: Not on file   Highest education level: Not on file  Occupational History   Not on file  Tobacco Use   Smoking status: Former    Types: Cigarettes   Smokeless tobacco: Never   Tobacco comments:    quit smoking in 1980's  Vaping Use   Vaping status: Never Used  Substance and Sexual Activity   Alcohol use: Yes    Comment: I drink beer every once in a while   Drug use: No   Sexual activity: Not on file  Other Topics Concern   Not on file  Social History Narrative   Not on file   Social Drivers of Health   Financial Resource Strain: Not on file  Food Insecurity: No Food Insecurity (05/22/2024)   Hunger Vital Sign     Worried About Running Out of Food in the Last Year: Never true    Ran Out of Food in the Last Year: Never true  Transportation Needs: No Transportation Needs (05/22/2024)   PRAPARE - Administrator, Civil Service (Medical): No    Lack of Transportation (Non-Medical): No  Physical Activity: Not on file  Stress: Not on file  Social Connections: Unknown (05/22/2024)   Social Connection and Isolation Panel    Frequency of Communication with Friends and Family: Patient declined    Frequency of Social Gatherings with Friends and Family: Patient declined    Attends Religious Services: Patient declined  Active Member of Clubs or Organizations: Patient declined    Attends Banker Meetings: Not on file    Marital Status: Patient declined     Family History:  The patient's family history includes Pancreatic cancer in his mother.   ROS:   Please see the history of present illness.    ROS All other systems reviewed and are negative.      No data to display             PHYSICAL EXAM:   VS:  BP (!) 122/58   Pulse 78   Ht 5' 8 (1.727 m)   Wt 189 lb (85.7 kg)   SpO2 98%   BMI 28.74 kg/m    GEN: Well nourished, well developed, in no acute distress  HEENT: normal  Neck: no JVD, carotid bruits, or masses Cardiac: RRR; no  rubs, or gallops,.  Intact distal pulses bilaterally. 2/6 SM at RUSB to LLSB Respiratory:  clear to auscultation bilaterally, normal work of breathing GI: soft, nontender, nondistended, + BS MS: no deformity or atrophy  Skin: marked RLE edema and erythema  Neuro:  Alert and Oriented x 3, Strength and sensation are intact Psych: euthymic mood, full affect  Wt Readings from Last 3 Encounters:  06/10/24 189 lb (85.7 kg)  05/21/24 170 lb (77.1 kg)  04/15/24 167 lb 8.8 oz (76 kg)      Studies/Labs Reviewed:   EKG Interpretation Date/Time:  Thursday June 10 2024 08:30:37 EDT Ventricular Rate:  78 PR Interval:    QRS  Duration:  90 QT Interval:  426 QTC Calculation: 485 R Axis:   13  Text Interpretation: Atrial fibrillation RSR' or QR pattern in V1 suggests right ventricular conduction delay When compared with ECG of 21-May-2024 07:02, PREVIOUS ECG IS PRESENT Confirmed by Shlomo Corning ([id number]) on 06/10/2024 8:56:38 AM      Recent Labs:    Lipid Panel    Component Value Date/Time   CHOL 83 03/21/2022 0453   TRIG 41 03/21/2022 0453   HDL 47 03/21/2022 0453   CHOLHDL 1.8 03/21/2022 0453   VLDL 8 03/21/2022 0453   LDLCALC 28 03/21/2022 0453     CHA2DS2-VASc Score = 4   This indicates a 4.8% annual risk of stroke. The patient's score is based upon: CHF History: 0 HTN History: 1 Diabetes History: 0 Stroke History: 2 Vascular Disease History: 0 Age Score: 1 Gender Score: 0   Additional studies/ records that were reviewed today include:         ASSESSMENT:    1. Atrial fibrillation, chronic (HCC)   2. Primary hypertension   3. Dilatation of aorta (HCC)      PLAN:  In order of problems listed above  #Chronic atrial fibrillation - Heart rate is well-controlled in A-fib today - He denies any palpitations or bleeding problems on DOAC - Continue apixaban  5 mg twice daily, Cardizem  CD 180 mg daily, Toprol -XL 12.5 mg daily with as needed refills - I have personally reviewed and interpreted outside labs performed by patient's PCP which showed serum creatinine 0.48, potassium 4 and hemoglobin 8.7 on 05/25/2024 -repeat Hbg>>low Hbg in Aug likely related to acute blood loss anemia from his fx hip  #Hypertension - BP controlled on exam today - Continue Cardizem  CD 180 mg daily and Toprol -XL 12.5 mg daily with as needed refills  #Acute cellulitis of RLE -he has had several day hx of RLE swelling with negative DVT by dopplers per patient on Tuesday -  now with 24 hours of erythema up to his knee c/w cellulitis -I have instructed his son to take him to [facility] ER when he leaves my office and he  agrees  #Heart Murmur -check 2D echo  Time Spent: 20 minutes total time of encounter, including 15 minutes spent in face-to-face patient care on the date of this encounter. This time includes coordination of care and counseling regarding above mentioned problem list. Remainder of non-face-to-face time involved reviewing chart documents/testing relevant to the patient encounter and documentation in the medical record. I have independently reviewed documentation from referring provider  Followup:  1 year  Medication Adjustments/Labs and Tests Ordered: Current medicines are reviewed at length with the patient today.  Concerns regarding medicines are outlined above.  Medication changes, Labs and Tests ordered today are listed in the Patient Instructions below.  Patient Instructions  Medication Instructions:  Your physician recommends that you continue on your current medications as directed. Please refer to the Current Medication list given to you today.  *If you need a refill on your cardiac medications before your next appointment, please call your pharmacy*  Lab Work: None.  If you have labs (blood work) drawn today and your tests are completely normal, you will receive your results only by: MyChart Message (if you have MyChart) OR A paper copy in the mail If you have any lab test that is abnormal or we need to change your treatment, we will call you to review the results.  Testing/Procedures: Your physician has requested that you have an echocardiogram in September 2026. Echocardiography is a painless test that uses sound waves to create images of your heart. It provides your doctor with information about the size and shape of your heart and how well your heart's chambers and valves are working. This procedure takes approximately one hour. There are no restrictions for this procedure. Please do NOT wear cologne, perfume, aftershave, or lotions (deodorant is allowed). Please arrive 15  minutes prior to your appointment time.  Please note: We ask at that you not bring children with you during ultrasound (echo/ vascular) testing. Due to room size and safety concerns, children are not allowed in the ultrasound rooms during exams. Our front office staff cannot provide observation of children in our lobby area while testing is being conducted. An adult accompanying a patient to their appointment will only be allowed in the ultrasound room at the discretion of the ultrasound technician under special circumstances. We apologize for any inconvenience.   Follow-Up: At [facility], you and your health needs are our priority.  As part of our continuing mission to provide you with exceptional heart care, our providers are all part of one team.  This team includes your primary Cardiologist (physician) and Advanced Practice Providers or APPs (Physician Assistants and Nurse Practitioners) who all work together to provide you with the care you need, when you need it.  Your next appointment:   1 year(s)  Provider:   Wilbert Bihari, MD          Signed, Wilbert Bihari, MD  06/10/2024 8:55 AM    [facility] [address] [place], [place], [place]  [address] Phone: [phone]; Fax: [phone]

## 2024-06-10 NOTE — Progress Notes (Signed)
 Orthopaedic Trauma Service (OTS) Progress Note  Patient ID: Jeffrey Randolph MRN: [id number] DOB/AGE: January 11, 1956 68 y.o.  HPI: Jeffrey Randolph is a 68 y.o. male who presented to the emergency department earlier today due to concerns for possible cellulitis.  Patient is s/p R THA on 05/22/2024. He was seen by Dr. Kendal in the OTS clinic on 06/08/2024 for first post-op appointment. Due to increased swelling and redness around right hip surgical incision, patient was started on doxycycline BID.  He has been taking the medication as prescribed.  During that visit, it was felt that patient has been engaged in too much activity which is contributing to the significant swelling he was experiencing.  He was told to scale back his activities, rest, and elevate the legs frequently.  He did undergo venous ultrasound to rule out DVT, this was negative.  Unfortunately, the swelling and redness appeared to have worsened over the past few days.  When seen by his cardiologist today for A-fib follow up, it was recommended he be evaluated in the emergency department for possible right leg cellulitis. I was contacted by EDP to evaluate patient. Patient seen this afternoon in the emergency department.  He is currently resting comfortably in bed. He is not having much pain.  He denies any fever, chills, nausea, vomiting.  States he has been very active at home.  He has been up and mobilizing with and without the use of his walker.  He states he has been taking care of 6 cats.  He is currently on Eliquis  for DVT prophylaxis.  Patient noted to be hypokalemic in ED, being replaced currently.  Lactic acid elevated at 2.8.  No significant leukocytosis noted on labs.  Past Medical History:  Diagnosis Date   Atrial fibrillation [facility])    Atrial fibrillation with rapid ventricular response (HCC)    BPH (benign prostatic hyperplasia)    Cirrhosis (HCC)    CVA (cerebral infarction)    Dysrhythmia    GERD (gastroesophageal reflux disease)     Hemiplegia due to old stroke (HCC)    History of stroke 05/26/2019   HNP (herniated nucleus pulposus), lumbar 01/09/2015   Hypertension    Hypokalemia    Hypomagnesemia 05/26/2019   Hyponatremia    Neuropathy    RLS (restless legs syndrome)    Stroke (HCC)    Left foot always feel 2 x larger, walks with a limp.   Past Surgical History:  Procedure Laterality Date   ESOPHAGOGASTRODUODENOSCOPY (EGD) WITH PROPOFOL  N/A 01/06/2023   Procedure: ESOPHAGOGASTRODUODENOSCOPY (EGD) WITH PROPOFOL ;  Surgeon: [name], MD;  Location: [facility] ENDOSCOPY;  Service: Gastroenterology;  Laterality: N/A;  Wants close to 9 AM arrival   LUMBAR LAMINECTOMY/DECOMPRESSION MICRODISCECTOMY Right 01/09/2015   Procedure: LUMBAR LAMINECTOMY/DECOMPRESSION MICRODISCECTOMY 1 LEVEL;  Surgeon: [name], MD;  Location: [facility] NEURO ORS;  Service: Neurosurgery;  Laterality: Right;  LUMBAR LAMINECTOMY/DECOMPRESSION MICRODISCECTOMY 1 LEVEL RIGHT LUMBAR 2-3   TONSILLECTOMY     TOTAL HIP ARTHROPLASTY Right 05/21/2024   Procedure: ARTHROPLASTY, HIP, TOTAL, ANTERIOR APPROACH;  Surgeon: Kendal Franky SQUIBB, MD;  Location: [facility] OR;  Service: Orthopedics;  Laterality: Right;   Family History  Problem Relation Age of Onset   Pancreatic cancer Mother     Social History:  reports that he has quit smoking. His smoking use included cigarettes. He has never used smokeless tobacco. He reports current alcohol use. He reports that he does not use drugs.  Allergies: No Known Allergies  Medications: Prior to Admission medications   Medication  Sig Start Date End Date Taking? Authorizing Provider  acetaminophen  (TYLENOL ) 325 MG tablet Take 2 tablets (650 mg total) by mouth every 6 (six) hours as needed for mild pain (pain score 1-3), fever or headache. 05/24/24   Danton Lauraine LABOR, PA-C  apixaban  (ELIQUIS ) 5 MG TABS tablet Take 1 tablet (5 mg total) by mouth 2 (two) times daily. 05/25/24   Samtani, Jai-Gurmukh, MD  chlorhexidine  (HIBICLENS ) 4 % external  liquid Apply 15 mLs (1 Application total) topically as directed for 30 doses. Use as directed daily for 5 days every other week for 6 weeks. 05/21/24   Danton Lauraine LABOR, PA-C  cholecalciferol  (CHOLECALCIFEROL ) 25 MCG tablet Take 1 tablet (1,000 Units total) by mouth daily. 05/24/24 08/22/24  Danton Lauraine LABOR, PA-C  cyanocobalamin  (VITAMIN B12) 1000 MCG tablet Take 1 tablet (1,000 mcg total) by mouth daily. Patient taking differently: Take 2,000 mcg by mouth daily. 04/16/24   Elgergawy, Brayton RAMAN, MD  diltiazem  (CARDIZEM  CD) 180 MG 24 hr capsule Take 1 capsule (180 mg total) by mouth daily. 05/26/24   Samtani, Jai-Gurmukh, MD  doxycycline (VIBRA-TABS) 100 MG tablet Take 100 mg by mouth 2 (two) times daily. 06/08/24   [provider]  lactulose  (CHRONULAC ) 10 GM/15ML solution Take 20 g by mouth 2 (two) times daily.    [provider]  methocarbamol  (ROBAXIN ) 500 MG tablet Take 500 mg by mouth daily as needed for muscle spasms.    [provider]  metoprolol  succinate (TOPROL -XL) 25 MG 24 hr tablet Take 0.5 tablets (12.5 mg total) by mouth daily. 05/26/24   Samtani, Jai-Gurmukh, MD  mupirocin  ointment (BACTROBAN ) 2 % Place 1 Application into the nose 2 (two) times daily for 60 doses. Use as directed 2 times daily for 5 days every other week for 6 weeks. 05/21/24 06/20/24  Danton Lauraine LABOR, PA-C  omeprazole (PRILOSEC) 40 MG capsule Take 40 mg by mouth daily.    [provider]  oxyCODONE  (OXY IR/ROXICODONE ) 5 MG immediate release tablet Take 0.5-1 tablets (2.5-5 mg total) by mouth every 4 (four) hours as needed for moderate pain (pain score 4-6) or severe pain (pain score 7-10) (2.5 mg pain score 4-6, 5 mg pain score 7-10). 05/24/24   Danton Lauraine LABOR, PA-C  pregabalin  (LYRICA ) 200 MG capsule Take 1 capsule (200 mg total) by mouth 3 (three) times daily. Patient taking differently: Take 200 mg by mouth daily as needed (pain). 05/28/23   Ghimire, Donalda HERO, MD  rOPINIRole  (REQUIP ) 0.5  MG tablet Take 1 mg by mouth at bedtime.    [provider]   I have reviewed the patient's current medications. Prior to Admission: (Not in a hospital admission)   Positive ROS: All other systems have been reviewed and were otherwise negative with the exception of those mentioned in the HPI and as above.  Exam: Blood pressure (!) 110/56, pulse (!) 58, temperature 98.4 F (36.9 C), temperature source Oral, resp. rate 13, SpO2 100%. General: Alert and oriented, no acute distress Respiratory: No cyanosis, no use of accessory musculature Psychiatric: Patient with normal mood and affect  Musculoskeletal: Right lower extremity: Notable pitting edema throughout extremity.  Increased erythema from the knee down as well as around surgical incision to anterior hip.  Surgical incision with scant amounts of serosanguineous/purulent drainage from the midportion.  Otherwise incision appears stable.  I wiped the small amount of drainage from the incision but was unable to express any additional purulent material with firm palpation around the incision.  Noted to have increased warmth to the leg.  Motor and sensory function at baseline.  2+ DP pulse  Left lower extremity: Notable erythema from the knee down.  Pitting edema throughout the lower leg.  Skin without lesions. No significant tenderness to palpation.  Tolerates gentle motion of the hip, knee, ankle.  Motor/sensory function at baseline.  Foot warm well-perfused.  2+ DP pulse   Medical Decision Making: Data: Imaging: No new imaging has been ordered today.  AP pelvis with AP and lateral views of the right hip performed in OTS clinic on 06/08/2024 shows stable appearance to the right hip prosthesis.  No signs of subsidence.  Labs:  Results for orders placed or performed during the hospital encounter of 06/10/24 (from the past 24 hours)  Urinalysis, Routine w reflex microscopic -Urine, Clean Catch     Status: Abnormal   Collection Time:  06/10/24 10:20 AM  Result Value Ref Range   Color, Urine STRAW (A) YELLOW   APPearance CLEAR CLEAR   Specific Gravity, Urine 1.002 (L) 1.005 - 1.030   pH 8.0 5.0 - 8.0   Glucose, UA NEGATIVE NEGATIVE mg/dL   Hgb urine dipstick NEGATIVE NEGATIVE   Bilirubin Urine NEGATIVE NEGATIVE   Ketones, ur NEGATIVE NEGATIVE mg/dL   Protein, ur NEGATIVE NEGATIVE mg/dL   Nitrite NEGATIVE NEGATIVE   Leukocytes,Ua NEGATIVE NEGATIVE  CBC with Differential     Status: Abnormal   Collection Time: 06/10/24 12:10 PM  Result Value Ref Range   WBC 6.0 4.0 - 10.5 K/uL   RBC 2.97 (L) 4.22 - 5.81 MIL/uL   Hemoglobin 10.2 (L) 13.0 - 17.0 g/dL   HCT 69.1 (L) 60.9 - 47.9 %   MCV 103.7 (H) 80.0 - 100.0 fL   MCH 34.3 (H) 26.0 - 34.0 pg   MCHC 33.1 30.0 - 36.0 g/dL   RDW 78.9 (H) 88.4 - 84.4 %   Platelets 222 150 - 400 K/uL   nRBC 0.3 (H) 0.0 - 0.2 %   Neutrophils Relative % 69 %   Neutro Abs 4.1 1.7 - 7.7 K/uL   Lymphocytes Relative 13 %   Lymphs Abs 0.8 0.7 - 4.0 K/uL   Monocytes Relative 13 %   Monocytes Absolute 0.8 0.1 - 1.0 K/uL   Eosinophils Relative 2 %   Eosinophils Absolute 0.1 0.0 - 0.5 K/uL   Basophils Relative 2 %   Basophils Absolute 0.1 0.0 - 0.1 K/uL   Immature Granulocytes 1 %   Abs Immature Granulocytes 0.07 0.00 - 0.07 K/uL  Basic metabolic panel     Status: Abnormal   Collection Time: 06/10/24 12:10 PM  Result Value Ref Range   Sodium 132 (L) 135 - 145 mmol/L   Potassium 3.1 (L) 3.5 - 5.1 mmol/L   Chloride 96 (L) 98 - 111 mmol/L   CO2 22 22 - 32 mmol/L   Glucose, Bld 113 (H) 70 - 99 mg/dL   BUN <5 (L) 8 - 23 mg/dL   Creatinine, Ser 9.39 (L) 0.61 - 1.24 mg/dL   Calcium 8.3 (L) 8.9 - 10.3 mg/dL   GFR, Estimated >39 >39 mL/min   Anion gap 14 5 - 15  Lactic acid, plasma     Status: Abnormal   Collection Time: 06/10/24 12:10 PM  Result Value Ref Range   Lactic Acid, Venous 2.8 (HH) 0.5 - 1.9 mmol/L    Medical history and chart was reviewed and case discussed with attending  provider.  Assessment/Plan: 68 year old male status post right  THA on 05/21/2024, presenting with worsening cellulitis  Patient with significant swelling and cellulitic changes to bilateral lower extremities (from the knee down).  Also noted to have increased redness around surgical incision on the right anterior hip.  I would recommend admission to medicine service for IV antibiotics.  No surgical intervention required at this time.  Will plan to re-evaluate patient in the morning to determine if any additional steps need to be taken, although I do not anticipate any surgical intervention.  Patient may have a diet today.  Please keep NPO after midnight in case surgery is needed tomorrow.   Lauraine PATRIC Moores PA-C [facility] [phone] (office) [email or web address]

## 2024-06-10 NOTE — Addendum Note (Signed)
 Addended by: JANIT GENI CROME on: 06/10/2024 09:10 AM   Modules accepted: Orders

## 2024-06-10 NOTE — ED Triage Notes (Signed)
 Pt sent over by MD for possible cellulitis in right leg , right leg is swollen and red , had negative dvt study on same leg 2 days ago

## 2024-06-10 NOTE — Hospital Course (Addendum)
 Jeffrey Randolph is a 68 y.o. year old male with PMH of  HFpEF, atrial fibrillation on eliquis  , history of CVA, neuropathy, alcoholic liver cirrhosis, and Chronic hyponatremia last admitted on 8/15/ 2025 for right hip arthroplasty after a fall and trip over a cat in August 2025 a presented with progressive redness in both legs worse on right.Patient was seen for follow-up in orthopedic clinic visit and found to have increased redness and swelling around the surgical incision site with concerns of infection was given doxycycline.  However it has progressed and when seen by his heart doctor 9/4 for his a.fib he was recommended to come to the emergency room In the ED: Hemodynamically stable.  Labs showed hyponatremia hypokalemia element 3 lactic acidosis up to 2.8> subsequently resolved to 1.8, hemoglobin 10.2 UA unremarkable alcohol less than 15 Orthopedic consulted patient was given IV fluids albumin  antibiotics, placed on CIWA scale and admitted for further management. Patient denies any breast pain antibiotics, right hip operative site no signs of infection Bilateral lower leg edema along with associated cellulitis with CHF exacerbation managed with diuresis Overall leg swelling better and edema better, patient is requesting for discharge although he would benefit with another day of IV diuretics but he is adamant on going home as he has animals at home that needs to be fed> he did not not want to stay any longer in the hospital As per his request discharged on oral antibiotics and Lasix  with instruction to follow-up with PCP and cardiology Vitals remained stable.  Subjective: Seen and examined Urine output 2440 negative 0.5l so far Overnight vital stable Labs pending this morning later resulted and stable He is adamant on leaving today  Discharge diagnosis:  Bilateral lower extremity cellulitis B/L Leg edema Status post right THA on 8/16: Orthopedic inputs appreciated - managed w/ vancomycin  and  cefepime > changed to Zyvox  and ceftriaxone  9/7 to minimize nephrotoxicity. No plan for surgical intervention Continue PT OT, WBAT RLE and continue pain management.  Orthopedics following continue dressing changes on the right THA site-and are planning for outpatient follow-up/. No DVT on recent duplex 9/2 as outpatient Patient BNP is elevated concern for possible CHF given IV diuretics with improving on leg swelling At this time is requesting for discharge will transition to oral antibiotics and plan for discharge with instruction for outpatient follow-up with PCP cardiology and orthopedics  Acute on chronic HFpEF: Does have lower leg edema-BNP elevated. PTA not on Lasix . last echo from 2020 with EF 55-60%. Given IV diuresis overall leg swelling improving, will discharge on oral Lasix  advised to check BMP in 5 to 7 days from PCP or cardiology   Lactic acidosis likely from volume depletion.  Electrolytes stable  Hypokalemia Hypomagnesemia: Improved.    Anemia of chronic disease: previous baseline as low as 6.9-8.7, monitor.  Stable overall Recent Labs    04/16/24 0850 05/21/24 0657 06/10/24 1210 06/11/24 0421 06/12/24 0650 06/13/24 0627 06/14/24 0946  HGB  --    < > 10.2* 8.4* 9.3* 9.7* 10.0*  MCV  --    < > 103.7* 103.2* 104.8* 104.2* 104.4*  VITAMINB12 327  --   --   --   --   --   --    < > = values in this interval not displayed.   Chronic atrial fibrillation: History of CVA Neuropathy: Cont eliquis  Cardizem .  Alcoholic liver cirrhosis Jeffrey Randolph is at risk of withdrawal: counseled for cessation counseling.  Continue CIWA scale Ativan .  Mild AST elevation noted.  Monitor  Chronic hyponatremia Stable  Recent admisson on 8/15/ 2025 for right hip arthroplasty  Mobility: PT Orders:  PT Follow up Rec: Home Health Pt9/03/2024 1700    Mobility: PT Orders:  PT Follow up Rec: Home Health Pt9/03/2024 1700   DVT prophylaxis: Eliquis  Code Status:   Code Status: Full  Code Family Communication: plan of care discussed with patient/ at bedside. Patient status is: Remains hospitalized because of severity of illness Level of care: Telemetry Medical   Dispo: The patient is from: home, lives independently            Anticipated disposition: TBD.  PT OT consulted Objective: Vitals last 24 hrs: Vitals:   06/13/24 1422 06/13/24 2016 06/14/24 0653 06/14/24 0838  BP: 125/63 134/71 (!) 117/56 (!) 112/54  Pulse: 70 86 80 81  Resp: 16 18 18 17   Temp: 98 F (36.7 C) 97.9 F (36.6 C) 98.2 F (36.8 C) 97.6 F (36.4 C)  TempSrc: Oral Oral Oral   SpO2: 100% 100% 99% 99%  Weight:      Height:        Physical Examination: General exam: aaox3 HEENT:Oral mucosa moist, Ear/Nose WNL grossly Respiratory system: Bilaterally clear BS,no use of accessory muscle Cardiovascular system: S1 & S2 +, No JVD. Gastrointestinal system: Abdomen soft,NT,ND, BS+ Nervous System: Alert, awake, moving all extremities,and following commands. Extremities: Bilateral lower extremity pitting edema with erythema more on right than left  Skin: No rashes,no icterus. MSK: Normal muscle bulk,tone, power

## 2024-06-10 NOTE — Patient Instructions (Addendum)
 Medication Instructions:  Your physician recommends that you continue on your current medications as directed. Please refer to the Current Medication list given to you today.  *If you need a refill on your cardiac medications before your next appointment, please call your pharmacy*  Lab Work: Please get a hemoglobin drawn at your earliest convenience. You can do this at the [facility] ER or at any [facility].   If you have labs (blood work) drawn today and your tests are completely normal, you will receive your results only by: MyChart Message (if you have MyChart) OR A paper copy in the mail If you have any lab test that is abnormal or we need to change your treatment, we will call you to review the results.  Testing/Procedures: Your physician has requested that you have an echocardiogram. Echocardiography is a painless test that uses sound waves to create images of your heart. It provides your doctor with information about the size and shape of your heart and how well your heart's chambers and valves are working. This procedure takes approximately one hour. There are no restrictions for this procedure. Please do NOT wear cologne, perfume, aftershave, or lotions (deodorant is allowed). Please arrive 15 minutes prior to your appointment time.  Please note: We ask at that you not bring children with you during ultrasound (echo/ vascular) testing. Due to room size and safety concerns, children are not allowed in the ultrasound rooms during exams. Our front office staff cannot provide observation of children in our lobby area while testing is being conducted. An adult accompanying a patient to their appointment will only be allowed in the ultrasound room at the discretion of the ultrasound technician under special circumstances. We apologize for any inconvenience.\   Follow-Up: At [facility], you and your health needs are our priority.  As part of our continuing mission to provide you with  exceptional heart care, our providers are all part of one team.  This team includes your primary Cardiologist (physician) and Advanced Practice Providers or APPs (Physician Assistants and Nurse Practitioners) who all work together to provide you with the care you need, when you need it.  Your next appointment:   1 year(s)  Provider:   Wilbert Bihari, MD     Other Instructions Dr. Bihari has advised you to present to the emergency department for evaluation of cellulitis of the leg.

## 2024-06-10 NOTE — Progress Notes (Addendum)
 Pharmacy Antibiotic Note  Jeffrey Randolph is a 68 y.o. male admitted on 06/10/2024 with cellulitis.  Pharmacy has been consulted for Vancomycin  and Cefepime  dosing.  Plan: Vancomycin  1500 mg x1 Vancomycin  1250 Q12H AUC target 537 x7days Cefepime  2 g Q8H x7 days  Baseline Scr of 0.5 up to 0.6 in ED. Continue to monitor.  Temp (24hrs), Avg:98.4 F (36.9 C), Min:98.1 F (36.7 C), Max:98.7 F (37.1 C)  Recent Labs  Lab 06/10/24 1210  WBC 6.0  CREATININE 0.60*  LATICACIDVEN 2.8*    Estimated Creatinine Clearance: 94.1 mL/min (A) (by C-G formula based on SCr of 0.6 mg/dL (L)).    No Known Allergies  Antimicrobials this admission: 9/4 CRO x1 9/4 Cefepime  >> (9/11) 9/4 Vancomycin  >> (9/11)  Dose adjustments this admission: None  Microbiology results: None collected   Thank you for allowing pharmacy to be a part of this patient's care.  Prentice DOROTHA Favors, PharmD PGY1 Health-System Pharmacy Administration and Leadership Resident [facility]  06/10/2024 7:59 PM

## 2024-06-10 NOTE — ED Notes (Signed)
 Poor venous access. IV team consult placed.

## 2024-06-10 NOTE — ED Provider Notes (Signed)
 [facility] Provider Note   CSN: [id number] Arrival date & time: 06/10/24  0930     Patient presents with: Leg Swelling   Jeffrey Randolph is a 68 y.o. male presents today from his cardiologist office for possible right leg cellulitis.  Right leg is swollen and erythematous.  Patient had a DVT study in the same leg 2 days ago which was negative.  Patient also reports right hip arthroplasty on 8/15.  Patient was prescribed doxycycline on 9/2 and is currently taking it.  Patient denies fever, chills, nausea, vomiting, chest pain, or shortness of breath.   HPI     Prior to Admission medications   Medication Sig Start Date End Date Taking? Authorizing Provider  acetaminophen  (TYLENOL ) 325 MG tablet Take 2 tablets (650 mg total) by mouth every 6 (six) hours as needed for mild pain (pain score 1-3), fever or headache. 05/24/24   Danton Lauraine LABOR, PA-C  apixaban  (ELIQUIS ) 5 MG TABS tablet Take 1 tablet (5 mg total) by mouth 2 (two) times daily. 05/25/24   Samtani, Jai-Gurmukh, MD  chlorhexidine  (HIBICLENS ) 4 % external liquid Apply 15 mLs (1 Application total) topically as directed for 30 doses. Use as directed daily for 5 days every other week for 6 weeks. 05/21/24   Danton Lauraine LABOR, PA-C  cholecalciferol  (CHOLECALCIFEROL ) 25 MCG tablet Take 1 tablet (1,000 Units total) by mouth daily. 05/24/24 08/22/24  Danton Lauraine LABOR, PA-C  cyanocobalamin  (VITAMIN B12) 1000 MCG tablet Take 1 tablet (1,000 mcg total) by mouth daily. Patient taking differently: Take 2,000 mcg by mouth daily. 04/16/24   Elgergawy, Brayton RAMAN, MD  diltiazem  (CARDIZEM  CD) 180 MG 24 hr capsule Take 1 capsule (180 mg total) by mouth daily. 05/26/24   Samtani, Jai-Gurmukh, MD  doxycycline (VIBRA-TABS) 100 MG tablet Take 100 mg by mouth 2 (two) times daily. 06/08/24   [provider]  lactulose  (CHRONULAC ) 10 GM/15ML solution Take 20 g by mouth 2 (two) times daily.    [provider]   methocarbamol  (ROBAXIN ) 500 MG tablet Take 500 mg by mouth daily as needed for muscle spasms.    [provider]  metoprolol  succinate (TOPROL -XL) 25 MG 24 hr tablet Take 0.5 tablets (12.5 mg total) by mouth daily. 05/26/24   Samtani, Jai-Gurmukh, MD  mupirocin  ointment (BACTROBAN ) 2 % Place 1 Application into the nose 2 (two) times daily for 60 doses. Use as directed 2 times daily for 5 days every other week for 6 weeks. 05/21/24 06/20/24  Danton Lauraine LABOR, PA-C  omeprazole (PRILOSEC) 40 MG capsule Take 40 mg by mouth daily.    [provider]  oxyCODONE  (OXY IR/ROXICODONE ) 5 MG immediate release tablet Take 0.5-1 tablets (2.5-5 mg total) by mouth every 4 (four) hours as needed for moderate pain (pain score 4-6) or severe pain (pain score 7-10) (2.5 mg pain score 4-6, 5 mg pain score 7-10). 05/24/24   Danton Lauraine LABOR, PA-C  pregabalin  (LYRICA ) 200 MG capsule Take 1 capsule (200 mg total) by mouth 3 (three) times daily. Patient taking differently: Take 200 mg by mouth daily as needed (pain). 05/28/23   Ghimire, Donalda HERO, MD  rOPINIRole  (REQUIP ) 0.5 MG tablet Take 1 mg by mouth at bedtime.    [provider]    Allergies: Patient has no known allergies.    Review of Systems  Skin:  Positive for color change.    Updated Vital Signs BP (!) 110/56   Pulse (!) 58  Temp 98.4 F (36.9 C) (Oral)   Resp 13   SpO2 100%   Physical Exam Vitals and nursing note reviewed.  Constitutional:      General: He is not in acute distress.    Appearance: Normal appearance. He is well-developed. He is not ill-appearing.  HENT:     Head: Normocephalic and atraumatic.     Right Ear: External ear normal.     Left Ear: External ear normal.  Eyes:     Conjunctiva/sclera: Conjunctivae normal.  Cardiovascular:     Rate and Rhythm: Normal rate and regular rhythm.     Pulses: Normal pulses.     Heart sounds: Normal heart sounds. No murmur heard. Pulmonary:     Effort: Pulmonary  effort is normal. No respiratory distress.     Breath sounds: Normal breath sounds.  Abdominal:     Palpations: Abdomen is soft.     Tenderness: There is no abdominal tenderness.  Musculoskeletal:        General: No swelling or deformity.     Cervical back: Neck supple.     Right lower leg: Edema present.     Left lower leg: Edema present.  Skin:    General: Skin is warm and dry.     Capillary Refill: Capillary refill takes less than 2 seconds.     Findings: Erythema present.     Comments: Erythema and swelling noted to left anterior lower extremity and foot and entirety of anterior right leg with notable erythema noted around patient's surgical site and anterior right lower extremity.  Patient is neurovascularly intact with +2 bilateral dorsalis pedis pulses.  Patient's previous right hip arthroplasty surgical site is erythematous with scant amounts of purulent discharge.  Neurological:     Mental Status: He is alert.  Psychiatric:        Mood and Affect: Mood normal.     (all labs ordered are listed, but only abnormal results are displayed) Labs Reviewed  URINALYSIS, ROUTINE W REFLEX MICROSCOPIC - Abnormal; Notable for the following components:      Result Value   Color, Urine STRAW (*)    Specific Gravity, Urine 1.002 (*)    All other components within normal limits  CBC WITH DIFFERENTIAL/PLATELET - Abnormal; Notable for the following components:   RBC 2.97 (*)    Hemoglobin 10.2 (*)    HCT 30.8 (*)    MCV 103.7 (*)    MCH 34.3 (*)    RDW 21.0 (*)    nRBC 0.3 (*)    All other components within normal limits  BASIC METABOLIC PANEL WITH GFR - Abnormal; Notable for the following components:   Sodium 132 (*)    Potassium 3.1 (*)    Chloride 96 (*)    Glucose, Bld 113 (*)    BUN <5 (*)    Creatinine, Ser 0.60 (*)    Calcium 8.3 (*)    All other components within normal limits  LACTIC ACID, PLASMA - Abnormal; Notable for the following components:   Lactic Acid, Venous 2.8  (*)    All other components within normal limits  LACTIC ACID, PLASMA  HEPATIC FUNCTION PANEL    EKG: None  Radiology: No results found.    Procedures   Medications Ordered in the ED  potassium chloride  10 mEq in 100 mL IVPB (10 mEq Intravenous New Bag/Given 06/10/24 1452)  cefTRIAXone  (ROCEPHIN ) 1 g in sodium chloride  0.9 % 100 mL IVPB (1 g Intravenous New Bag/Given 06/10/24 1449)  morphine  (PF) 4 MG/ML injection 4 mg (4 mg Intravenous Given 06/10/24 1247)  ondansetron  (ZOFRAN ) injection 4 mg (4 mg Intravenous Given 06/10/24 1247)  sodium chloride  0.9 % bolus 1,000 mL (1,000 mLs Intravenous New Bag/Given 06/10/24 1308)                                    Medical Decision Making Amount and/or Complexity of Data Reviewed Labs: ordered.  Risk Prescription drug management. Decision regarding hospitalization.   This patient presents to the ED for concern of leg swelling and erythema differential diagnosis includes cellulitis, osteomyelitis, DVT, infected surgical hardware  Additional history obtained   Additional history obtained from Electronic Medical Record External records from outside source obtained and reviewed including previous admission   Lab Tests:  I Ordered, and personally interpreted labs.  The pertinent results include: UA with low specific gravity, elevated lactic at 2.8, anemia 10.2 which is chronic per historical values, hyponatremia 3.1, reduced sodium at 132   Medicines ordered and prescription drug management:  I ordered medication including IVF and potassium, IV Rocephin     I have reviewed the patients home medicines and have made adjustments as needed   Problem List / ED Course:  Consulted orthopedics, Lauraine Moores PA-C who is agreeable to see the patient while admitted. Consulted hospitalist, Dr. Tobie who is agreeable to admission.       Final diagnoses:  Cellulitis of lower extremity, unspecified laterality    ED Discharge Orders      None          Francis Ileana SAILOR, PA-C 06/10/24 1457    Bernard Drivers, MD 06/11/24 [phone]

## 2024-06-10 NOTE — ED Notes (Signed)
 Phlebotomist enroute to collect labs. This paramedic attempted to collect- unsuccessful.

## 2024-06-11 DIAGNOSIS — L03119 Cellulitis of unspecified part of limb: Secondary | ICD-10-CM | POA: Diagnosis not present

## 2024-06-11 LAB — COMPREHENSIVE METABOLIC PANEL WITH GFR
ALT: 14 U/L (ref 0–44)
AST: 26 U/L (ref 15–41)
Albumin: 3 g/dL — ABNORMAL LOW (ref 3.5–5.0)
Alkaline Phosphatase: 119 U/L (ref 38–126)
Anion gap: 8 (ref 5–15)
BUN: <5 mg/dL — ABNORMAL LOW (ref 8–23)
CO2: 23 mmol/L (ref 22–32)
Calcium: 8.1 mg/dL — ABNORMAL LOW (ref 8.9–10.3)
Chloride: 99 mmol/L (ref 98–111)
Creatinine, Ser: 0.49 mg/dL — ABNORMAL LOW (ref 0.61–1.24)
GFR, Estimated: >60 mL/min (ref >60)
Glucose, Bld: 98 mg/dL (ref 70–99)
Potassium: 2.9 mmol/L — ABNORMAL LOW (ref 3.5–5.1)
Sodium: 130 mmol/L — ABNORMAL LOW (ref 135–145)
Total Bilirubin: 1.5 mg/dL — ABNORMAL HIGH (ref 0.0–1.2)
Total Protein: 5.6 g/dL — ABNORMAL LOW (ref 6.5–8.1)

## 2024-06-11 LAB — CBC WITH DIFFERENTIAL/PLATELET
Abs Immature Granulocytes: 0.06 K/uL (ref 0.00–0.07)
Basophils Absolute: 0.1 K/uL (ref 0.0–0.1)
Basophils Relative: 2 %
Eosinophils Absolute: 0.1 K/uL (ref 0.0–0.5)
Eosinophils Relative: 4 %
HCT: 25.8 % — ABNORMAL LOW (ref 39.0–52.0)
Hemoglobin: 8.4 g/dL — ABNORMAL LOW (ref 13.0–17.0)
Immature Granulocytes: 2 %
Lymphocytes Relative: 15 %
Lymphs Abs: 0.6 K/uL — ABNORMAL LOW (ref 0.7–4.0)
MCH: 33.6 pg (ref 26.0–34.0)
MCHC: 32.6 g/dL (ref 30.0–36.0)
MCV: 103.2 fL — ABNORMAL HIGH (ref 80.0–100.0)
Monocytes Absolute: 0.5 K/uL (ref 0.1–1.0)
Monocytes Relative: 12 %
Neutro Abs: 2.5 K/uL (ref 1.7–7.7)
Neutrophils Relative %: 65 %
Platelets: 167 K/uL (ref 150–400)
RBC: 2.5 MIL/uL — ABNORMAL LOW (ref 4.22–5.81)
RDW: 21 % — ABNORMAL HIGH (ref 11.5–15.5)
WBC: 3.8 K/uL — ABNORMAL LOW (ref 4.0–10.5)
nRBC: 0 % (ref 0.0–0.2)

## 2024-06-11 LAB — PHOSPHORUS: Phosphorus: 3.5 mg/dL (ref 2.5–4.6)

## 2024-06-11 LAB — POTASSIUM: Potassium: 3.8 mmol/L (ref 3.5–5.1)

## 2024-06-11 LAB — MAGNESIUM: Magnesium: 1.7 mg/dL (ref 1.7–2.4)

## 2024-06-11 MED ORDER — POTASSIUM CHLORIDE 10 MEQ/100ML IV SOLN
10.0000 meq | INTRAVENOUS | Status: AC
  Administered 2024-06-11 (×3): 10 meq via INTRAVENOUS
  Filled 2024-06-11 (×3): qty 100

## 2024-06-11 MED ORDER — MAGNESIUM SULFATE 2 GM/50ML IV SOLN
2.0000 g | Freq: Once | INTRAVENOUS | Status: AC
  Administered 2024-06-11: 2 g via INTRAVENOUS
  Filled 2024-06-11: qty 50

## 2024-06-11 MED ORDER — POTASSIUM CHLORIDE CRYS ER 20 MEQ PO TBCR
40.0000 meq | EXTENDED_RELEASE_TABLET | ORAL | Status: AC
  Administered 2024-06-11 (×2): 40 meq via ORAL
  Filled 2024-06-11 (×2): qty 2

## 2024-06-11 MED ORDER — PANTOPRAZOLE SODIUM 40 MG PO TBEC
40.0000 mg | DELAYED_RELEASE_TABLET | Freq: Every day | ORAL | Status: DC
  Administered 2024-06-12 – 2024-06-14 (×3): 40 mg via ORAL
  Filled 2024-06-11 (×3): qty 1

## 2024-06-11 NOTE — Progress Notes (Signed)
 PROGRESS NOTE Jeffrey Randolph  FMW:[id number] DOB: Oct 18, 1955 DOA: 06/10/2024 PCP: Montey Lot, PA-C  Brief Narrative/Hospital Course: Jeffrey Randolph is a 68 y.o. year old male with PMH of  HFpEF, atrial fibrillation on eliquis  , history of CVA, neuropathy, alcoholic liver cirrhosis, and Chronic hyponatremia last admitted on 8/15/ 2025 for right hip arthroplasty after a fall and trip over a cat in August 2025 a presented with progressive redness in both legs worse on right.Patient was seen for follow-up in orthopedic clinic visit and found to have increased redness and swelling around the surgical incision site with concerns of infection was given doxycycline.  However it has progressed and when seen by his heart doctor 9/4 for his a.fib he was recommended to come to the emergency room In the ED: Hemodynamically stable.  Labs showed hyponatremia hypokalemia element 3 lactic acidosis up to 2.8> subsequently resolved to 1.8, hemoglobin 10.2 UA unremarkable alcohol less than 15 Orthopedic consulted patient was given IV fluids albumin  antibiotics, placed on CIWA scale and admitted for further management   Subjective: Seen and examined today Resting comfortably reports his leg swelling and redness improving Overnight afebrile BP stable 118, on room air Labs with significant hypokalemia 2.9 leukopenia 3.8  Assessment and plan:  Bilateral lower extremity cellulitis Orthopedic inputs appreciated continue with vancomycin  and cefepime , No surgery anticipated. will consult PT OT, WBAT RLE and continue pain management.  No DVT on recent duplex 9/2  Lactic acidosis likely from volume depletion  Hypokalemia Hypomagnesemia: Replacing aggressively.  Monitor and repeat. Recent Labs  Lab 06/10/24 1210 06/11/24 0421  K 3.1* 2.9*  CALCIUM 8.3* 8.1*  MG  --  1.7  PHOS  --  3.5   Anemia of chronic disease: with some mild drop in hemoglobin, previous baseline as low as 6.9-8.7, monitor Recent Labs     04/16/24 0850 05/21/24 0657 05/23/24 0716 05/24/24 0518 05/25/24 0642 06/10/24 1210 06/11/24 0421  HGB  --    < > 6.9* 7.9* 8.7* 10.2* 8.4*  MCV  --    < > 109.0* 102.7* 99.6 103.7* 103.2*  VITAMINB12 327  --   --   --   --   --   --    < > = values in this interval not displayed.    HFpEF: Monitor volume status.  Does have leg swelling holding diuretics hopefully can resume soon.  Last echo from 2020 with EF 55-60%.  Chronic atrial fibrillation: History of CVA Neuropathy: PT OT eval prior to dischargecont eliquis  Cardizem .  Alcoholic liver cirrhosis Jeffrey Randolph is at risk of withdrawal: [facility] counseled for cessation counseling.  Continue CIWA scale Ativan .  Mild AST elevation noted.  Monitor  Chronic hyponatremia Stable  Recent admisson on 8/15/ 2025 for right hip arthroplasty  Mobility: PT Orders:  PT Follow up Rec:     Mobility: PT Orders:  PT Follow up Rec:    DVT prophylaxis: Eliquis  Code Status:   Code Status: Full Code Family Communication: plan of care discussed with patient/ at bedside. Patient status is: Remains hospitalized because of severity of illness Level of care: Telemetry Medical   Dispo: The patient is from: home, lives independently            Anticipated disposition: TBD.  PT OT consulted Objective: Vitals last 24 hrs: Vitals:   06/10/24 2009 06/10/24 2339 06/11/24 0410 06/11/24 0800  BP: (!) 120/50 (!) 120/50 116/63 118/71  Pulse: 65 65 (!) 58 76  Resp: 17 16 17 18   Temp: 98.7  F (37.1 C) 98.7 F (37.1 C) 98.8 F (37.1 C) 98.8 F (37.1 C)  TempSrc: Oral Oral Oral Oral  SpO2: 97%  95% 97%  Weight:  85.7 kg    Height:  5' 8 (1.727 m)      Physical Examination: General exam: alert awake, oriented, older than stated age HEENT:Oral mucosa moist, Ear/Nose WNL grossly Respiratory system: Bilaterally clear BS,no use of accessory muscle Cardiovascular system: S1 & S2 +, No JVD. Gastrointestinal system: Abdomen soft,NT,ND, BS+ Nervous  System: Alert, awake, moving all extremities,and following commands. Extremities: LE edema with erythema tenderness on bilateral legs Skin: No rashes,no icterus. MSK: Normal muscle bulk,tone, power   Medications reviewed:  Scheduled Meds:  apixaban   5 mg Oral BID   diltiazem   120 mg Oral Daily   folic acid   1 mg Oral Daily   multivitamin with minerals  1 tablet Oral Daily   [START ON 06/12/2024] pantoprazole   40 mg Oral Daily   potassium chloride   40 mEq Oral Q3H   rOPINIRole   1 mg Oral QHS   thiamine   100 mg Oral Daily   Continuous Infusions:  ceFEPime  (MAXIPIME ) IV 2 g (06/11/24 0356)   lactated ringers  75 mL/hr at 06/10/24 2112   magnesium  sulfate bolus IVPB     potassium chloride  10 mEq (06/11/24 1007)   vancomycin  1,250 mg (06/11/24 1012)   Diet: Diet Order             Diet regular Room service appropriate? Yes; Fluid consistency: Thin  Diet effective now                    Data Reviewed: I have personally reviewed following labs and imaging studies ( see epic result tab) CBC: Recent Labs  Lab 06/10/24 1210 06/11/24 0421  WBC 6.0 3.8*  NEUTROABS 4.1 2.5  HGB 10.2* 8.4*  HCT 30.8* 25.8*  MCV 103.7* 103.2*  PLT 222 167   CMP: Recent Labs  Lab 06/10/24 1210 06/11/24 0421  NA 132* 130*  K 3.1* 2.9*  CL 96* 99  CO2 22 23  GLUCOSE 113* 98  BUN <5* <5*  CREATININE 0.60* 0.49*  CALCIUM 8.3* 8.1*  MG  --  1.7  PHOS  --  3.5   GFR: Estimated Creatinine Clearance: 94.1 mL/min (A) (by C-G formula based on SCr of 0.49 mg/dL (L)). Recent Labs  Lab 06/10/24 1209 06/11/24 0421  AST 47* 26  ALT 17 14  ALKPHOS 160* 119  BILITOT 1.3* 1.5*  PROT 6.4* 5.6*  ALBUMIN  3.0* 3.0*   No results for input(s): LIPASE, AMYLASE in the last 168 hours. No results for input(s): AMMONIA in the last 168 hours. Coagulation Profile: No results for input(s): INR, PROTIME in the last 168 hours. Unresulted Labs (From admission, onward)     Start     Ordered    06/12/24 0500  Basic metabolic panel with GFR  Daily,   R      06/11/24 0905   06/12/24 0500  CBC  Daily,   R      06/11/24 0905   06/12/24 0500  Magnesium   Tomorrow morning,   R        06/11/24 1001   06/12/24 0500  Phosphorus  Tomorrow morning,   R        06/11/24 1001   06/11/24 1900  Potassium  Once,   R        06/11/24 0903   06/11/24 0500  Occult blood card  to lab, stool  Daily,   R      06/10/24 1939           Antimicrobials/Microbiology: Anti-infectives (From admission, onward)    Start     Dose/Rate Route Frequency Ordered Stop   06/11/24 1000  vancomycin  (VANCOREADY) IVPB 1250 mg/250 mL       Placed in Followed by Linked Group   1,250 mg 166.7 mL/hr over 90 Minutes Intravenous Every 12 hours 06/10/24 2003 06/17/24 2159   06/10/24 2015  vancomycin  (VANCOREADY) IVPB 1500 mg/300 mL       Placed in Followed by Linked Group   1,500 mg 150 mL/hr over 120 Minutes Intravenous  Once 06/10/24 2003 06/11/24 0056   06/10/24 2000  vancomycin  (VANCOREADY) IVPB 1500 mg/300 mL  Status:  Discontinued        1,500 mg 150 mL/hr over 120 Minutes Intravenous Every 12 hours 06/10/24 1946 06/10/24 2002   06/10/24 1945  ceFEPIme  (MAXIPIME ) 2 g in sodium chloride  0.9 % 100 mL IVPB        2 g 200 mL/hr over 30 Minutes Intravenous Every 8 hours 06/10/24 1946 06/17/24 1959   06/10/24 1430  cefTRIAXone  (ROCEPHIN ) 1 g in sodium chloride  0.9 % 100 mL IVPB        1 g 200 mL/hr over 30 Minutes Intravenous  Once 06/10/24 1420 06/10/24 1522         Component Value Date/Time   SDES  03/20/2022 1030    PERITONEAL Performed at [facility], [address], [place], [place] [address]    [facility]  03/20/2022 1030    NONE Performed at [facility], [address] [place], [place] [address]    CULT  03/20/2022 1030    NO GROWTH 3 DAYS Performed at [facility], [address], [place], [place] [address]    REPTSTATUS 03/25/2022 FINAL 03/20/2022 1030     Procedures:  Mennie LAMY, MD [facility] 06/11/2024, 10:40 AM

## 2024-06-11 NOTE — Progress Notes (Signed)
 Orthopaedic Trauma Progress Note  SUBJECTIVE: Doing okay this morning.  Pain controlled.  Eager to get out of bed.  Has not noticed any significant drainage from his anterior hip incision overnight.  No fever/chills.  No chest pain. No SOB. No nausea/vomiting. No other complaints.  Did not eat much dinner last night, hungry this morning.  Wanting to order breakfast. Patient noted to be hypokalemic and hyponatremic on morning labs.  Potassium being replaced today  OBJECTIVE:  Vitals:   06/11/24 0410 06/11/24 0800  BP: 116/63 118/71  Pulse: (!) 58 76  Resp: 17 18  Temp: 98.8 F (37.1 C) 98.8 F (37.1 C)  SpO2: 95% 97%    Opiates Today (MME): Today's  total administered Morphine  Milligram Equivalents: 0 Opiates Yesterday (MME): Yesterday's total administered Morphine  Milligram Equivalents: 12  General: Sitting up in bed, no acute distress.  Pleasant and cooperative Respiratory: No increased work of breathing.  RLE: Slightly less erythema to the lower leg compared to yesterday's exam.  Redness has not extended beyond area of demarcation just below the knee.  Continues to have pitting edema throughout the extremity.  No active drainage from hip incision.  With firm palpation, unable to express any purulent material.  Tolerates gentle knee and ankle range of motion.  2+ DP pulse.  LLE: Erythema from knee down to foot, slightly less red over the last 18 hours.  Continues to have moderate swelling throughout the extremity.  Otherwise skin without lesions.  No significant tenderness with palpation throughout the extremity.  Tolerates gentle hip, knee, ankle range of motion motor and sensory function at baseline.  Foot warm well-perfused.  2+ DP pulse.  IMAGING: No new imaging this hospitalization.  Pelvis/right hip imaging from OTS clinic on 06/08/2024 stable  LABS:  Results for orders placed or performed during the hospital encounter of 06/10/24 (from the past 24 hours)  Urinalysis, Routine w  reflex microscopic -Urine, Clean Catch     Status: Abnormal   Collection Time: 06/10/24 10:20 AM  Result Value Ref Range   Color, Urine STRAW (A) YELLOW   APPearance CLEAR CLEAR   Specific Gravity, Urine 1.002 (L) 1.005 - 1.030   pH 8.0 5.0 - 8.0   Glucose, UA NEGATIVE NEGATIVE mg/dL   Hgb urine dipstick NEGATIVE NEGATIVE   Bilirubin Urine NEGATIVE NEGATIVE   Ketones, ur NEGATIVE NEGATIVE mg/dL   Protein, ur NEGATIVE NEGATIVE mg/dL   Nitrite NEGATIVE NEGATIVE   Leukocytes,Ua NEGATIVE NEGATIVE  Hepatic function panel     Status: Abnormal   Collection Time: 06/10/24 12:09 PM  Result Value Ref Range   Total Protein 6.4 (L) 6.5 - 8.1 g/dL   Albumin  3.0 (L) 3.5 - 5.0 g/dL   AST 47 (H) 15 - 41 U/L   ALT 17 0 - 44 U/L   Alkaline Phosphatase 160 (H) 38 - 126 U/L   Total Bilirubin 1.3 (H) 0.0 - 1.2 mg/dL   Bilirubin, Direct 0.5 (H) 0.0 - 0.2 mg/dL   Indirect Bilirubin 0.8 0.3 - 0.9 mg/dL  CBC with Differential     Status: Abnormal   Collection Time: 06/10/24 12:10 PM  Result Value Ref Range   WBC 6.0 4.0 - 10.5 K/uL   RBC 2.97 (L) 4.22 - 5.81 MIL/uL   Hemoglobin 10.2 (L) 13.0 - 17.0 g/dL   HCT 69.1 (L) 60.9 - 47.9 %   MCV 103.7 (H) 80.0 - 100.0 fL   MCH 34.3 (H) 26.0 - 34.0 pg   MCHC 33.1 30.0 -  36.0 g/dL   RDW 78.9 (H) 88.4 - 84.4 %   Platelets 222 150 - 400 K/uL   nRBC 0.3 (H) 0.0 - 0.2 %   Neutrophils Relative % 69 %   Neutro Abs 4.1 1.7 - 7.7 K/uL   Lymphocytes Relative 13 %   Lymphs Abs 0.8 0.7 - 4.0 K/uL   Monocytes Relative 13 %   Monocytes Absolute 0.8 0.1 - 1.0 K/uL   Eosinophils Relative 2 %   Eosinophils Absolute 0.1 0.0 - 0.5 K/uL   Basophils Relative 2 %   Basophils Absolute 0.1 0.0 - 0.1 K/uL   Immature Granulocytes 1 %   Abs Immature Granulocytes 0.07 0.00 - 0.07 K/uL  Basic metabolic panel     Status: Abnormal   Collection Time: 06/10/24 12:10 PM  Result Value Ref Range   Sodium 132 (L) 135 - 145 mmol/L   Potassium 3.1 (L) 3.5 - 5.1 mmol/L   Chloride 96  (L) 98 - 111 mmol/L   CO2 22 22 - 32 mmol/L   Glucose, Bld 113 (H) 70 - 99 mg/dL   BUN <5 (L) 8 - 23 mg/dL   Creatinine, Ser 9.39 (L) 0.61 - 1.24 mg/dL   Calcium 8.3 (L) 8.9 - 10.3 mg/dL   GFR, Estimated >39 >39 mL/min   Anion gap 14 5 - 15  Lactic acid, plasma     Status: Abnormal   Collection Time: 06/10/24 12:10 PM  Result Value Ref Range   Lactic Acid, Venous 2.8 (HH) 0.5 - 1.9 mmol/L  Ethanol     Status: None   Collection Time: 06/10/24  6:52 PM  Result Value Ref Range   Alcohol, Ethyl (B) <15 <15 mg/dL  Type and screen     Status: None   Collection Time: 06/10/24  8:22 PM  Result Value Ref Range   ABO/RH(D) O POS    Antibody Screen NEG    Sample Expiration      06/13/2024,2359 Performed at [facility], [address], [place], [place] [address]   Lactic acid, plasma     Status: None   Collection Time: 06/10/24  8:25 PM  Result Value Ref Range   Lactic Acid, Venous 1.8 0.5 - 1.9 mmol/L  Lactic acid, plasma     Status: Abnormal   Collection Time: 06/10/24 10:25 PM  Result Value Ref Range   Lactic Acid, Venous 2.0 (HH) 0.5 - 1.9 mmol/L  CBC with Differential/Platelet     Status: Abnormal   Collection Time: 06/11/24  4:21 AM  Result Value Ref Range   WBC 3.8 (L) 4.0 - 10.5 K/uL   RBC 2.50 (L) 4.22 - 5.81 MIL/uL   Hemoglobin 8.4 (L) 13.0 - 17.0 g/dL   HCT 74.1 (L) 60.9 - 47.9 %   MCV 103.2 (H) 80.0 - 100.0 fL   MCH 33.6 26.0 - 34.0 pg   MCHC 32.6 30.0 - 36.0 g/dL   RDW 78.9 (H) 88.4 - 84.4 %   Platelets 167 150 - 400 K/uL   nRBC 0.0 0.0 - 0.2 %   Neutrophils Relative % 65 %   Neutro Abs 2.5 1.7 - 7.7 K/uL   Lymphocytes Relative 15 %   Lymphs Abs 0.6 (L) 0.7 - 4.0 K/uL   Monocytes Relative 12 %   Monocytes Absolute 0.5 0.1 - 1.0 K/uL   Eosinophils Relative 4 %   Eosinophils Absolute 0.1 0.0 - 0.5 K/uL   Basophils Relative 2 %   Basophils Absolute 0.1  0.0 - 0.1 K/uL   Immature Granulocytes 2 %   Abs Immature Granulocytes 0.06 0.00 - 0.07 K/uL   Comprehensive metabolic panel with GFR     Status: Abnormal   Collection Time: 06/11/24  4:21 AM  Result Value Ref Range   Sodium 130 (L) 135 - 145 mmol/L   Potassium 2.9 (L) 3.5 - 5.1 mmol/L   Chloride 99 98 - 111 mmol/L   CO2 23 22 - 32 mmol/L   Glucose, Bld 98 70 - 99 mg/dL   BUN <5 (L) 8 - 23 mg/dL   Creatinine, Ser 9.50 (L) 0.61 - 1.24 mg/dL   Calcium 8.1 (L) 8.9 - 10.3 mg/dL   Total Protein 5.6 (L) 6.5 - 8.1 g/dL   Albumin  3.0 (L) 3.5 - 5.0 g/dL   AST 26 15 - 41 U/L   ALT 14 0 - 44 U/L   Alkaline Phosphatase 119 38 - 126 U/L   Total Bilirubin 1.5 (H) 0.0 - 1.2 mg/dL   GFR, Estimated >39 >39 mL/min   Anion gap 8 5 - 15  Magnesium      Status: None   Collection Time: 06/11/24  4:21 AM  Result Value Ref Range   Magnesium  1.7 1.7 - 2.4 mg/dL  Phosphorus     Status: None   Collection Time: 06/11/24  4:21 AM  Result Value Ref Range   Phosphorus 3.5 2.5 - 4.6 mg/dL    ASSESSMENT: Jeffrey Randolph is a 68 y.o. male status post right THA on 05/22/2024, readmitted 06/10/2024 for BLE cellulitis  CV/Blood loss: Hgb 8.4 this AM. Hemodynamically stable  PLAN: Weightbearing: WBAT RLE ROM: Unrestricted ROM.  No hip precautions needed Incisional and dressing care: Change right hip dressing as needed.  Clean around incision daily with soap and water  Orthopedic device(s): None  Pain management: Continue current regimen VTE prophylaxis: Eliquis , SCDs, ID: Vancomycin  and cefepime  Foley/Lines:  No foley, KVO IVFs Dispo: Continue care per primary team.  Continue IV antibiotics appropriate no surgical intervention needed at this time.  Will give patient a diet today.  I anticipate swelling and redness to continue to improve with antibiotics.  Please reconsult orthopedics if symptoms worsen or incision develops increased drainage.   Follow - up plan: 2 weeks after d/c for wound check and repeat x-rays   Contact information:  Franky Light MD, Lauraine Moores PA-C. After hours and holidays please  check [email or web address] for group call information for [facility]   Lauraine PATRIC Moores, PA-C [phone] (office) [email or web address]

## 2024-06-12 DIAGNOSIS — L03119 Cellulitis of unspecified part of limb: Secondary | ICD-10-CM | POA: Diagnosis not present

## 2024-06-12 LAB — CBC
HCT: 28.4 % — ABNORMAL LOW (ref 39.0–52.0)
Hemoglobin: 9.3 g/dL — ABNORMAL LOW (ref 13.0–17.0)
MCH: 34.3 pg — ABNORMAL HIGH (ref 26.0–34.0)
MCHC: 32.7 g/dL (ref 30.0–36.0)
MCV: 104.8 fL — ABNORMAL HIGH (ref 80.0–100.0)
Platelets: 170 K/uL (ref 150–400)
RBC: 2.71 MIL/uL — ABNORMAL LOW (ref 4.22–5.81)
RDW: 21.4 % — ABNORMAL HIGH (ref 11.5–15.5)
WBC: 6.7 K/uL (ref 4.0–10.5)
nRBC: 0 % (ref 0.0–0.2)

## 2024-06-12 LAB — BASIC METABOLIC PANEL WITH GFR
Anion gap: 9 (ref 5–15)
BUN: <5 mg/dL — ABNORMAL LOW (ref 8–23)
CO2: 22 mmol/L (ref 22–32)
Calcium: 8.3 mg/dL — ABNORMAL LOW (ref 8.9–10.3)
Chloride: 101 mmol/L (ref 98–111)
Creatinine, Ser: 0.52 mg/dL — ABNORMAL LOW (ref 0.61–1.24)
GFR, Estimated: >60 mL/min (ref >60)
Glucose, Bld: 114 mg/dL — ABNORMAL HIGH (ref 70–99)
Potassium: 3.8 mmol/L (ref 3.5–5.1)
Sodium: 132 mmol/L — ABNORMAL LOW (ref 135–145)

## 2024-06-12 LAB — MAGNESIUM: Magnesium: 1.9 mg/dL (ref 1.7–2.4)

## 2024-06-12 LAB — BRAIN NATRIURETIC PEPTIDE: B Natriuretic Peptide: 382.6 pg/mL — ABNORMAL HIGH (ref 0.0–100.0)

## 2024-06-12 LAB — PHOSPHORUS: Phosphorus: 3.1 mg/dL (ref 2.5–4.6)

## 2024-06-12 MED ORDER — POTASSIUM CHLORIDE CRYS ER 20 MEQ PO TBCR
20.0000 meq | EXTENDED_RELEASE_TABLET | Freq: Every day | ORAL | Status: AC
  Administered 2024-06-12 – 2024-06-13 (×2): 20 meq via ORAL
  Filled 2024-06-12 (×2): qty 1

## 2024-06-12 MED ORDER — FUROSEMIDE 10 MG/ML IJ SOLN
20.0000 mg | Freq: Two times a day (BID) | INTRAMUSCULAR | Status: AC
  Administered 2024-06-12 (×2): 20 mg via INTRAVENOUS
  Filled 2024-06-12 (×2): qty 2

## 2024-06-12 NOTE — Progress Notes (Signed)
 PROGRESS NOTE Jeffrey Randolph  FMW:[id number] DOB: 07/09/1956 DOA: 06/10/2024 PCP: Montey Lot, PA-C  Brief Narrative/Hospital Course: Jeffrey Randolph is a 68 y.o. year old male with PMH of  HFpEF, atrial fibrillation on eliquis  , history of CVA, neuropathy, alcoholic liver cirrhosis, and Chronic hyponatremia last admitted on 8/15/ 2025 for right hip arthroplasty after a fall and trip over a cat in August 2025 a presented with progressive redness in both legs worse on right.Patient was seen for follow-up in orthopedic clinic visit and found to have increased redness and swelling around the surgical incision site with concerns of infection was given doxycycline.  However it has progressed and when seen by his heart doctor 9/4 for his a.fib he was recommended to come to the emergency room In the ED: Hemodynamically stable.  Labs showed hyponatremia hypokalemia element 3 lactic acidosis up to 2.8> subsequently resolved to 1.8, hemoglobin 10.2 UA unremarkable alcohol less than 15 Orthopedic consulted patient was given IV fluids albumin  antibiotics, placed on CIWA scale and admitted for further management   Subjective: Seen and examined Resting well. On RA Leg swelling improving Overnight afebrile BP stable labs this morning reviewed hemoglobin up 9.3 WBC normal, electrolytes stable  Assessment and plan:  Bilateral lower extremity cellulitis B/L Leg edema: Orthopedic inputs appreciated continue with vancomycin  and cefepime  for now no plan for surgical intervention Continue PT OT, WBAT RLE and continue pain management. No DVT on recent duplex 9/2 as outpatient Add lasix  iv and bnp.  Lactic acidosis likely from volume depletion.  Electrolytes stable  Hypokalemia Hypomagnesemia: Currently resolved after replacement.    Anemia of chronic disease: previous baseline as low as 6.9-8.7, monitor.  Stable overall Recent Labs    04/16/24 0850 05/21/24 0657 05/24/24 0518 05/25/24 0642 06/10/24 1210  06/11/24 0421 06/12/24 0650  HGB  --    < > 7.9* 8.7* 10.2* 8.4* 9.3*  MCV  --    < > 102.7* 99.6 103.7* 103.2* 104.8*  VITAMINB12 327  --   --   --   --   --   --    < > = values in this interval not displayed.    HFpEF: Monitor volume status.Does have leg swelling -will  add Lasix  x 1, PTA not on Lasix   last echo from 2020 with EF 55-60%.  Chronic atrial fibrillation: History of CVA Neuropathy: Cont eliquis  Cardizem .  Alcoholic liver cirrhosis [name] is at risk of withdrawal: counseled for cessation counseling.  Continue CIWA scale Ativan .  Mild AST elevation noted.  Monitor  Chronic hyponatremia Stable  Recent admisson on 8/15/ 2025 for right hip arthroplasty  Mobility: PT Orders:  PT Follow up Rec:     Mobility: PT Orders:  PT Follow up Rec:    DVT prophylaxis: Eliquis  Code Status:   Code Status: Full Code Family Communication: plan of care discussed with patient/ at bedside. Patient status is: Remains hospitalized because of severity of illness Level of care: Telemetry Medical   Dispo: The patient is from: home, lives independently            Anticipated disposition: TBD.  PT OT consulted Objective: Vitals last 24 hrs: Vitals:   06/11/24 1645 06/11/24 1950 06/12/24 0427 06/12/24 0857  BP: 118/72 (!) 129/56 (!) 125/56 (!) 120/56  Pulse: 83 81 88 98  Resp: 16 18 14 16   Temp: 97.7 F (36.5 C) 98.1 F (36.7 C) 99.6 F (37.6 C) 99 F (37.2 C)  TempSrc: Oral  Oral Oral  SpO2: 100%  100% 96% 97%  Weight:      Height:        Physical Examination: General exam: aaox3 HEENT:Oral mucosa moist, Ear/Nose WNL grossly Respiratory system: Bilaterally clear BS,no use of accessory muscle Cardiovascular system: S1 & S2 +, No JVD. Gastrointestinal system: Abdomen soft,NT,ND, BS+ Nervous System: Alert, awake, moving all extremities,and following commands. Extremities:  b/l LLE w/ edema erythema tenderness more on RLE- overall  receding  Skin: No rashes,no  icterus. MSK: Normal muscle bulk,tone, power   Medications reviewed:  Scheduled Meds:  apixaban   5 mg Oral BID   diltiazem   120 mg Oral Daily   folic acid   1 mg Oral Daily   multivitamin with minerals  1 tablet Oral Daily   pantoprazole   40 mg Oral Daily   rOPINIRole   1 mg Oral QHS   thiamine   100 mg Oral Daily   Continuous Infusions:  ceFEPime  (MAXIPIME ) IV 2 g (06/12/24 0400)   lactated ringers  75 mL/hr at 06/10/24 2112   vancomycin  Stopped (06/11/24 2345)   Diet: Diet Order             Diet regular Room service appropriate? Yes; Fluid consistency: Thin  Diet effective now                    Data Reviewed: I have personally reviewed following labs and imaging studies ( see epic result tab) CBC: Recent Labs  Lab 06/10/24 1210 06/11/24 0421 06/12/24 0650  WBC 6.0 3.8* 6.7  NEUTROABS 4.1 2.5  --   HGB 10.2* 8.4* 9.3*  HCT 30.8* 25.8* 28.4*  MCV 103.7* 103.2* 104.8*  PLT 222 167 170   CMP: Recent Labs  Lab 06/10/24 1210 06/11/24 0421 06/11/24 1905 06/12/24 0650  NA 132* 130*  --  132*  K 3.1* 2.9* 3.8 3.8  CL 96* 99  --  101  CO2 22 23  --  22  GLUCOSE 113* 98  --  114*  BUN <5* <5*  --  <5*  CREATININE 0.60* 0.49*  --  0.52*  CALCIUM 8.3* 8.1*  --  8.3*  MG  --  1.7  --  1.9  PHOS  --  3.5  --  3.1   GFR: Estimated Creatinine Clearance: 94.1 mL/min (A) (by C-G formula based on SCr of 0.52 mg/dL (L)). Recent Labs  Lab 06/10/24 1209 06/11/24 0421  AST 47* 26  ALT 17 14  ALKPHOS 160* 119  BILITOT 1.3* 1.5*  PROT 6.4* 5.6*  ALBUMIN  3.0* 3.0*   No results for input(s): LIPASE, AMYLASE in the last 168 hours. No results for input(s): AMMONIA in the last 168 hours. Coagulation Profile: No results for input(s): INR, PROTIME in the last 168 hours. Unresulted Labs (From admission, onward)     Start     Ordered   06/12/24 1005  Brain natriuretic peptide  Add-on,   AD        06/12/24 1004   06/12/24 0500  Basic metabolic panel with GFR   Daily,   R      06/11/24 0905   06/12/24 0500  CBC  Daily,   R      06/11/24 0905   06/11/24 0500  Occult blood card to lab, stool  Daily,   R      06/10/24 1939           Antimicrobials/Microbiology: Anti-infectives (From admission, onward)    Start     Dose/Rate Route Frequency Ordered Stop  06/11/24 1000  vancomycin  (VANCOREADY) IVPB 1250 mg/250 mL       Placed in Followed by Linked Group   1,250 mg 166.7 mL/hr over 90 Minutes Intravenous Every 12 hours 06/10/24 2003 06/17/24 2159   06/10/24 2015  vancomycin  (VANCOREADY) IVPB 1500 mg/300 mL       Placed in Followed by Linked Group   1,500 mg 150 mL/hr over 120 Minutes Intravenous  Once 06/10/24 2003 06/11/24 0056   06/10/24 2000  vancomycin  (VANCOREADY) IVPB 1500 mg/300 mL  Status:  Discontinued        1,500 mg 150 mL/hr over 120 Minutes Intravenous Every 12 hours 06/10/24 1946 06/10/24 2002   06/10/24 1945  ceFEPIme  (MAXIPIME ) 2 g in sodium chloride  0.9 % 100 mL IVPB        2 g 200 mL/hr over 30 Minutes Intravenous Every 8 hours 06/10/24 1946 06/17/24 1959   06/10/24 1430  cefTRIAXone  (ROCEPHIN ) 1 g in sodium chloride  0.9 % 100 mL IVPB        1 g 200 mL/hr over 30 Minutes Intravenous  Once 06/10/24 1420 06/10/24 1522         Component Value Date/Time   SDES  03/20/2022 1030    PERITONEAL Performed at [facility], [address], [place], [place] [address]    [facility]  03/20/2022 1030    NONE Performed at [facility], [address] [place], [place] [address]    CULT  03/20/2022 1030    NO GROWTH 3 DAYS Performed at [facility], [address], [place], [place] [address]    REPTSTATUS 03/25/2022 FINAL 03/20/2022 1030    Procedures:  Mennie LAMY, MD [facility] 06/12/2024, 10:04 AM

## 2024-06-12 NOTE — Evaluation (Addendum)
 Occupational Therapy Evaluation Patient Details Name: Jeffrey Randolph MRN: [id number] DOB: 01/26/1956 Today's Date: 06/12/2024   History of Present Illness   Jeffrey Randolph is a 68 y.o. male who presented 06/10/24 due to edema in BLE and admitted due to cellulitis. Pt had recent admission due to a fall and then s/p R THA 8/15. PMH: HFpEF, atrial fibrillation, CVA, neuropathy, alcoholic liver cirrhosis, and hyponatremia.     Clinical Impressions Pt reported since recent dc from hospital they went to SNF and then was dc and was working with [facility]. Pt has friends to assist with transport/groceries at discharge. Pt at this time had loose Bms in session and required CGA to ambulate to bathroom and completion of peri care. Pt was educated about modifications due to toilet outside the home ( adult briefs, urinal and BSC). At this time Acute Occupational Therapy to follow and recommendation for [facility].      If plan is discharge home, recommend the following:   Assistance with cooking/housework;Assist for transportation     Functional Status Assessment   Patient has had a recent decline in their functional status and demonstrates the ability to make significant improvements in function in a reasonable and predictable amount of time.     Equipment Recommendations   BSC/3in1     Recommendations for Other Services         Precautions/Restrictions   Precautions Precautions: Fall Recall of Precautions/Restrictions: Intact Precaution/Restrictions Comments: No Hip Precautions Restrictions Weight Bearing Restrictions Per Provider Order: Yes RLE Weight Bearing Per Provider Order: Weight bearing as tolerated     Mobility Bed Mobility Overal bed mobility: Needs Assistance Bed Mobility: Supine to Sit, Sit to Supine     Supine to sit: Contact guard Sit to supine: Contact guard assist   General bed mobility comments: Pt reported maily sleeping in recliner at home.    Transfers Overall  transfer level: Needs assistance Equipment used: Rolling walker (2 wheels) Transfers: Sit to/from Stand Sit to Stand: Supervision, Contact guard assist           General transfer comment: cues to not pull onto walker      Balance Overall balance assessment: Needs assistance Sitting-balance support: Feet supported Sitting balance-Leahy Scale: Good     Standing balance support: Single extremity supported, Bilateral upper extremity supported, No upper extremity supported Standing balance-Leahy Scale: Fair                             ADL either performed or assessed with clinical judgement   ADL Overall ADL's : Needs assistance/impaired Eating/Feeding: Set up;Sitting   Grooming: Set up;Sitting   Upper Body Bathing: Set up;Sitting;Standing   Lower Body Bathing: Contact guard assist;Sit to/from stand   Upper Body Dressing : Contact guard assist;Sitting   Lower Body Dressing: Contact guard assist;Sit to/from stand   Toilet Transfer: Contact guard assist;Rolling walker (2 wheels)   Toileting- Clothing Manipulation and Hygiene: Contact guard assist       Functional mobility during ADLs: Contact guard assist;Rolling walker (2 wheels)       Vision Baseline Vision/History: 4 Cataracts;0 No visual deficits Ability to See in Adequate Light: 0 Adequate Patient Visual Report: No change from baseline Vision Assessment?: No apparent visual deficits     Perception         Praxis         Pertinent Vitals/Pain Pain Assessment Pain Assessment: No/denies pain Pain Score: 0-No pain  Extremity/Trunk Assessment Upper Extremity Assessment Upper Extremity Assessment: Right hand dominant   Lower Extremity Assessment Lower Extremity Assessment: Defer to PT evaluation       Communication Communication Communication: No apparent difficulties   Cognition Arousal: Alert Behavior During Therapy: WFL for tasks assessed/performed Cognition: No apparent  impairments             OT - Cognition Comments: AAOx4 and pleasant throughout session                 Following commands: Intact       Cueing  General Comments   Cueing Techniques: Verbal cues;Gestural cues;Tactile cues      Exercises     Shoulder Instructions      Home Living Family/patient expects to be discharged to:: Private residence Living Arrangements: Alone Available Help at Discharge: Friend(s);Available PRN/intermittently Type of Home: House (log cabin) Home Access: Stairs to enter [employer] of Steps: 1 Entrance Stairs-Rails: None Home Layout: One level     Bathroom Shower/Tub: Sponge bathes at baseline   Bathroom Toilet: Standard (outhouse)     Home Equipment: Agricultural consultant (2 wheels);Wheelchair - manual          Prior Functioning/Environment Prior Level of Function : Independent/Modified Independent             Mobility Comments: ambulates with RW. ADLs Comments: Pt reports mod I for ADLs, friends assist with transportation    OT Problem List: Decreased strength;Decreased activity tolerance;Impaired balance (sitting and/or standing);Decreased knowledge of use of DME or AE;Cardiopulmonary status limiting activity   OT Treatment/Interventions: Self-care/ADL training;Therapeutic exercise;DME and/or AE instruction;Visual/perceptual remediation/compensation;Patient/family education      OT Goals(Current goals can be found in the care plan section)   Acute Rehab OT Goals Patient Stated Goal: to go home OT Goal Formulation: With patient Time For Goal Achievement: 06/26/24 Potential to Achieve Goals: Good   OT Frequency:  Min 2X/week    Co-evaluation              AM-PAC OT 6 Clicks Daily Activity     Outcome Measure Help from another person eating meals?: None Help from another person taking care of personal grooming?: None Help from another person toileting, which includes using toliet, bedpan, or  urinal?: A Little Help from another person bathing (including washing, rinsing, drying)?: A Little Help from another person to put on and taking off regular upper body clothing?: None Help from another person to put on and taking off regular lower body clothing?: A Little 6 Click Score: 21   End of Session Equipment Utilized During Treatment: Gait belt;Rolling walker (2 wheels) Nurse Communication: Mobility status  Activity Tolerance: Patient tolerated treatment well Patient left: in chair;with call bell/phone within reach  OT Visit Diagnosis: Unsteadiness on feet (R26.81);Other abnormalities of gait and mobility (R26.89);Repeated falls (R29.6);Muscle weakness (generalized) (M62.81);History of falling (Z91.81)                Time: 9250-9171 OT Time Calculation (min): 39 min Charges:  OT General Charges $OT Visit: 1 Visit OT Evaluation $OT Eval Low Complexity: 1 Low OT Treatments $Self Care/Home Management : 23-37 mins  Warrick POUR OTR/L  Acute Rehab Services  [phone] office number   Warrick Berber 06/12/2024, 8:40 AM

## 2024-06-12 NOTE — Evaluation (Signed)
 Physical Therapy Evaluation & Discharge Patient Details Name: Jeffrey Randolph MRN: [id number] DOB: December 03, 1955 Today's Date: 06/12/2024  History of Present Illness  Jeffrey Randolph is a 68 y.o. male who presented 06/10/24 due to edema in BLE and admitted due to cellulitis. Pt had recent admission due to a fall and then s/p R THA 8/15. PMH: HFpEF, atrial fibrillation, CVA, neuropathy, alcoholic liver cirrhosis, and hyponatremia.   Clinical Impression  Pt presents with condition above. PTA, he was mod I utilizing a RW for functional mobility, living alone in a 1-level house with 1 STE. Prior to his recent R TKA 8/15, pt was independent without DME. Currently, the pt is able to mobilize mod I with a RW, but displays deficits in strength, balance, and endurance that place him at risk for falls. The pt is capable of ambulating short distances cautiously and slowly without UE support though, but currently is benefiting from using a RW to reduce his risk for falls. Educated pt on performing AROM of his legs and elevating his legs to manage the edema. Educated pt on performing 3-way hip AROM exercises to improve hip strength s/p THA. He verbalized understanding. As pt is functioning at his recent baseline, no further skilled acute PT services needed, but will recommend follow-up with HHPT to address his deficits above and ideally progress him away from relying on an AD to ambulate. All education complete and questions answered. Acute PT will sign off and defer further PT needs to HHPT.        If plan is discharge home, recommend the following: Assistance with cooking/housework;Assist for transportation   Can travel by private vehicle   Yes    Equipment Recommendations BSC/3in1  Recommendations for Other Services       Functional Status Assessment Patient has had a recent decline in their functional status and demonstrates the ability to make significant improvements in function in a reasonable and predictable  amount of time.     Precautions / Restrictions Precautions Precautions: Fall Recall of Precautions/Restrictions: Intact Precaution/Restrictions Comments: No Hip Precautions Restrictions Weight Bearing Restrictions Per Provider Order: Yes RLE Weight Bearing Per Provider Order: Weight bearing as tolerated      Mobility  Bed Mobility               General bed mobility comments: Pt sitting in recliner upon arrival and at end of session.    Transfers Overall transfer level: Modified independent Equipment used: Rolling walker (2 wheels) Transfers: Sit to/from Stand Sit to Stand: Modified independent (Device/Increase time)           General transfer comment: Pt able to stand from recliner without LOB or need for assistance    Ambulation/Gait Ambulation/Gait assistance: Modified independent (Device/Increase time), Supervision Gait Distance (Feet): 280 Feet Assistive device: Rolling walker (2 wheels), None Gait Pattern/deviations: Step-through pattern, Decreased step length - right, Decreased step length - left, Decreased stride length, Decreased dorsiflexion - right, Decreased dorsiflexion - left, Trunk flexed, Wide base of support Gait velocity: reduced Gait velocity interpretation: <1.8 ft/sec, indicate of risk for recurrent falls   General Gait Details: Pt ambulated ~10 ft without UE support with a wide BOS and small steps, no LOB, but slow and cautious, supervision for safety. Pt able to ambulate with improved stability when utilizing his RW, mod I. No LOB noted. Pt does need cues to keep RW on ground and remain proximal to it along with cues to clear feet.  Stairs Stairs:  (did not attempt stairs  but pt able to lift bil legs >6 inches to clear a step with RW support and no LOB or assistance)          Wheelchair Mobility     Tilt Bed    Modified Rankin (Stroke Patients Only)       Balance Overall balance assessment: Needs assistance Sitting-balance  support: Feet supported Sitting balance-Leahy Scale: Good     Standing balance support: Bilateral upper extremity supported, No upper extremity supported Standing balance-Leahy Scale: Fair Standing balance comment: Able to ambulate without UE support but mild instability noted, more stable with RW                             Pertinent Vitals/Pain Pain Assessment Pain Assessment: Faces Faces Pain Scale: Hurts a little bit Pain Location: bil legs Pain Descriptors / Indicators: Discomfort, Guarding, Sore Pain Intervention(s): Monitored during session, Limited activity within patient's tolerance, Repositioned    Home Living Family/patient expects to be discharged to:: Private residence Living Arrangements: Alone Available Help at Discharge: Friend(s);Available PRN/intermittently Type of Home: House (log cabin) Home Access: Stairs to enter Entrance Stairs-Rails: None Entrance Stairs-Number of Steps: 1   Home Layout: One level Home Equipment: Agricultural consultant (2 wheels);Wheelchair - manual      Prior Function Prior Level of Function : Independent/Modified Independent             Mobility Comments: ambulates with RW since THA, no AD prior to that ADLs Comments: Pt reports mod I for ADLs, friends assist with transportation     Extremity/Trunk Assessment   Upper Extremity Assessment Upper Extremity Assessment: Defer to OT evaluation    Lower Extremity Assessment Lower Extremity Assessment: RLE deficits/detail;LLE deficits/detail RLE Deficits / Details: erythema and edema noted in bil lower legs; grossly >/= 4/5 bil; recent R THA 8/15 LLE Deficits / Details: erythema and edema noted in bil lower legs; grossly >/= 4/5 bil    Cervical / Trunk Assessment Cervical / Trunk Assessment: Normal  Communication   Communication Communication: No apparent difficulties    Cognition Arousal: Alert Behavior During Therapy: WFL for tasks assessed/performed   PT -  Cognitive impairments: No apparent impairments                         Following commands: Intact       Cueing Cueing Techniques: Verbal cues, Tactile cues, Visual cues     General Comments General comments (skin integrity, edema, etc.): Educated pt on performing AROM of his legs and elevating his legs to manage the edema. Educated pt on performing 3-way hip AROM exercises to improve hip strength s/p THA. He verbalized understanding.    Exercises     Assessment/Plan    PT Assessment All further PT needs can be met in the next venue of care  PT Problem List Decreased strength;Decreased activity tolerance;Decreased balance;Decreased mobility;Decreased skin integrity;Pain       PT Treatment Interventions DME instruction;Gait training;Functional mobility training;Therapeutic activities;Therapeutic exercise;Balance training;Patient/family education;Stair training;Neuromuscular re-education    PT Goals (Current goals can be found in the Care Plan section)  Acute Rehab PT Goals Patient Stated Goal: to go home PT Goal Formulation: With patient Time For Goal Achievement: 06/26/24 Potential to Achieve Goals: Good    Frequency Min 1X/week     Co-evaluation               AM-PAC PT 6 Clicks Mobility  Outcome Measure Help needed turning from your back to your side while in a flat bed without using bedrails?: A Little Help needed moving from lying on your back to sitting on the side of a flat bed without using bedrails?: A Little Help needed moving to and from a bed to a chair (including a wheelchair)?: None Help needed standing up from a chair using your arms (e.g., wheelchair or bedside chair)?: None Help needed to walk in hospital room?: None Help needed climbing 3-5 steps with a railing? : A Little 6 Click Score: 21    End of Session   Activity Tolerance: Patient tolerated treatment well Patient left: in chair;with call bell/phone within reach (pt noted to be  ambulating mod I around halls prior to PT arrival) Nurse Communication:  (pt noted to be ambulating mod I around halls prior to PT arrival) PT Visit Diagnosis: Unsteadiness on feet (R26.81);Other abnormalities of gait and mobility (R26.89);Difficulty in walking, not elsewhere classified (R26.2);Pain Pain - Right/Left:  (bil) Pain - part of body: Leg    Time: 8563-8550 PT Time Calculation (min) (ACUTE ONLY): 13 min   Charges:   PT Evaluation $PT Eval Low Complexity: 1 Low   PT General Charges $$ ACUTE PT VISIT: 1 Visit         Theo Ferretti, PT, DPT Acute Rehabilitation Services  Office: [phone]   Theo CHRISTELLA Ferretti 06/12/2024, 5:42 PM

## 2024-06-12 NOTE — Plan of Care (Signed)

## 2024-06-13 DIAGNOSIS — L03119 Cellulitis of unspecified part of limb: Secondary | ICD-10-CM | POA: Diagnosis not present

## 2024-06-13 LAB — BASIC METABOLIC PANEL WITH GFR
Anion gap: 8 (ref 5–15)
BUN: <5 mg/dL — ABNORMAL LOW (ref 8–23)
CO2: 24 mmol/L (ref 22–32)
Calcium: 8.6 mg/dL — ABNORMAL LOW (ref 8.9–10.3)
Chloride: 100 mmol/L (ref 98–111)
Creatinine, Ser: 0.54 mg/dL — ABNORMAL LOW (ref 0.61–1.24)
GFR, Estimated: >60 mL/min (ref >60)
Glucose, Bld: 118 mg/dL — ABNORMAL HIGH (ref 70–99)
Potassium: 4.4 mmol/L (ref 3.5–5.1)
Sodium: 132 mmol/L — ABNORMAL LOW (ref 135–145)

## 2024-06-13 LAB — CBC
HCT: 29.5 % — ABNORMAL LOW (ref 39.0–52.0)
Hemoglobin: 9.7 g/dL — ABNORMAL LOW (ref 13.0–17.0)
MCH: 34.3 pg — ABNORMAL HIGH (ref 26.0–34.0)
MCHC: 32.9 g/dL (ref 30.0–36.0)
MCV: 104.2 fL — ABNORMAL HIGH (ref 80.0–100.0)
Platelets: 179 K/uL (ref 150–400)
RBC: 2.83 MIL/uL — ABNORMAL LOW (ref 4.22–5.81)
RDW: 21.2 % — ABNORMAL HIGH (ref 11.5–15.5)
WBC: 4.7 K/uL (ref 4.0–10.5)
nRBC: 0 % (ref 0.0–0.2)

## 2024-06-13 MED ORDER — SODIUM CHLORIDE 0.9 % IV SOLN
2.0000 g | INTRAVENOUS | Status: DC
  Administered 2024-06-13: 2 g via INTRAVENOUS
  Filled 2024-06-13: qty 20

## 2024-06-13 MED ORDER — LINEZOLID 600 MG PO TABS
600.0000 mg | ORAL_TABLET | Freq: Two times a day (BID) | ORAL | Status: DC
  Administered 2024-06-13 – 2024-06-14 (×2): 600 mg via ORAL
  Filled 2024-06-13 (×4): qty 1

## 2024-06-13 MED ORDER — FUROSEMIDE 10 MG/ML IJ SOLN
40.0000 mg | Freq: Two times a day (BID) | INTRAMUSCULAR | Status: DC
  Administered 2024-06-13 – 2024-06-14 (×3): 40 mg via INTRAVENOUS
  Filled 2024-06-13 (×3): qty 4

## 2024-06-13 NOTE — Progress Notes (Signed)
 PROGRESS NOTE Jeffrey Randolph  FMW:[id number] DOB: 1956/08/17 DOA: 06/10/2024 PCP: Montey Lot, PA-C  Brief Narrative/Hospital Course: Jeffrey Randolph is a 68 y.o. year old male with PMH of  HFpEF, atrial fibrillation on eliquis  , history of CVA, neuropathy, alcoholic liver cirrhosis, and Chronic hyponatremia last admitted on 8/15/ 2025 for right hip arthroplasty after a fall and trip over a cat in August 2025 a presented with progressive redness in both legs worse on right.Patient was seen for follow-up in orthopedic clinic visit and found to have increased redness and swelling around the surgical incision site with concerns of infection was given doxycycline.  However it has progressed and when seen by his heart doctor 9/4 for his a.fib he was recommended to come to the emergency room In the ED: Hemodynamically stable.  Labs showed hyponatremia hypokalemia element 3 lactic acidosis up to 2.8> subsequently resolved to 1.8, hemoglobin 10.2 UA unremarkable alcohol less than 15 Orthopedic consulted patient was given IV fluids albumin  antibiotics, placed on CIWA scale and admitted for further management   Subjective: Seen and examined Resting comfortably in the bedside chair no leg pain or other new complaints Leg appears erythematous and swollen has been voiding well after Lasix   Assessment and plan:  Bilateral lower extremity cellulitis B/L Leg edema: Orthopedic inputs appreciated continue with vancomycin  and cefepime  for now no plan for surgical intervention Continue PT OT, WBAT RLE and continue pain management. No DVT on recent duplex 9/2 as outpatient Patient BNP is elevated concern for possible CHF will keep an IV Lasix  and monitor intake output  Acute on chronic HFpEF: Does have lower leg edema-BNP elevated. PTA not on Lasix   last echo from 2020 with EF 55-60%. Continue IV Lasix  as above Cont to monitor daily I/O,weight, electrolytes and net balance as below. Net IO Since Admission: 955.5 mL  [06/13/24 0921]  Filed Weights   06/10/24 2339  Weight: 85.7 kg    Recent Labs  Lab 06/10/24 1210 06/11/24 0421 06/11/24 1905 06/12/24 0650 06/13/24 0627  BNP  --   --   --  382.6*  --   BUN <5* <5*  --  <5* <5*  CREATININE 0.60* 0.49*  --  0.52* 0.54*  K 3.1* 2.9* 3.8 3.8 4.4  MG  --  1.7  --  1.9  --     Lactic acidosis likely from volume depletion.  Electrolytes stable  Hypokalemia Hypomagnesemia: Improved.    Anemia of chronic disease: previous baseline as low as 6.9-8.7, monitor.  Stable overall Recent Labs    04/16/24 0850 05/21/24 0657 05/25/24 0642 06/10/24 1210 06/11/24 0421 06/12/24 0650 06/13/24 0627  HGB  --    < > 8.7* 10.2* 8.4* 9.3* 9.7*  MCV  --    < > 99.6 103.7* 103.2* 104.8* 104.2*  VITAMINB12 327  --   --   --   --   --   --    < > = values in this interval not displayed.   Chronic atrial fibrillation: History of CVA Neuropathy: Cont eliquis  Cardizem .  Alcoholic liver cirrhosis [name] is at risk of withdrawal: counseled for cessation counseling.  Continue CIWA scale Ativan .  Mild AST elevation noted.  Monitor  Chronic hyponatremia Stable  Recent admisson on 8/15/ 2025 for right hip arthroplasty  Mobility: PT Orders:  PT Follow up Rec: Home Health Pt9/03/2024 1700    Mobility: PT Orders:  PT Follow up Rec: Home Health Pt9/03/2024 1700   DVT prophylaxis: Eliquis  Code Status:   Code  Status: Full Code Family Communication: plan of care discussed with patient/ at bedside. Patient status is: Remains hospitalized because of severity of illness Level of care: Telemetry Medical   Dispo: The patient is from: home, lives independently            Anticipated disposition: TBD.  PT OT consulted Objective: Vitals last 24 hrs: Vitals:   06/12/24 1557 06/12/24 1957 06/13/24 0538 06/13/24 0817  BP: 117/61 138/60 112/60 119/63  Pulse: 79 88 75 81  Resp: 15 18 18 16   Temp: 98.2 F (36.8 C) 97.6 F (36.4 C) 98.3 F (36.8 C) 98.3 F (36.8  C)  TempSrc: Oral Oral Oral Oral  SpO2: 100% 100% 98% 99%  Weight:      Height:        Physical Examination: General exam: aaox3 HEENT:Oral mucosa moist, Ear/Nose WNL grossly Respiratory system: Bilaterally clear BS,no use of accessory muscle Cardiovascular system: S1 & S2 +, No JVD. Gastrointestinal system: Abdomen soft,NT,ND, BS+ Nervous System: Alert, awake, moving all extremities,and following commands. Extremities: Bilateral lower extremity pitting edema with erythema more on right than left  Skin: No rashes,no icterus. MSK: Normal muscle bulk,tone, power   Medications reviewed:  Scheduled Meds:  apixaban   5 mg Oral BID   diltiazem   120 mg Oral Daily   folic acid   1 mg Oral Daily   multivitamin with minerals  1 tablet Oral Daily   pantoprazole   40 mg Oral Daily   rOPINIRole   1 mg Oral QHS   thiamine   100 mg Oral Daily   Continuous Infusions:  ceFEPime  (MAXIPIME ) IV 2 g (06/13/24 0358)   vancomycin  1,250 mg (06/13/24 0920)   Diet: Diet Order             Diet regular Room service appropriate? Yes; Fluid consistency: Thin  Diet effective now                    Data Reviewed: I have personally reviewed following labs and imaging studies ( see epic result tab) CBC: Recent Labs  Lab 06/10/24 1210 06/11/24 0421 06/12/24 0650 06/13/24 0627  WBC 6.0 3.8* 6.7 4.7  NEUTROABS 4.1 2.5  --   --   HGB 10.2* 8.4* 9.3* 9.7*  HCT 30.8* 25.8* 28.4* 29.5*  MCV 103.7* 103.2* 104.8* 104.2*  PLT 222 167 170 179   CMP: Recent Labs  Lab 06/10/24 1210 06/11/24 0421 06/11/24 1905 06/12/24 0650 06/13/24 0627  NA 132* 130*  --  132* 132*  K 3.1* 2.9* 3.8 3.8 4.4  CL 96* 99  --  101 100  CO2 22 23  --  22 24  GLUCOSE 113* 98  --  114* 118*  BUN <5* <5*  --  <5* <5*  CREATININE 0.60* 0.49*  --  0.52* 0.54*  CALCIUM 8.3* 8.1*  --  8.3* 8.6*  MG  --  1.7  --  1.9  --   PHOS  --  3.5  --  3.1  --    GFR: Estimated Creatinine Clearance: 94.1 mL/min (A) (by C-G formula  based on SCr of 0.54 mg/dL (L)). Recent Labs  Lab 06/10/24 1209 06/11/24 0421  AST 47* 26  ALT 17 14  ALKPHOS 160* 119  BILITOT 1.3* 1.5*  PROT 6.4* 5.6*  ALBUMIN  3.0* 3.0*   No results for input(s): LIPASE, AMYLASE in the last 168 hours. No results for input(s): AMMONIA in the last 168 hours. Coagulation Profile: No results for input(s): INR, PROTIME in  the last 168 hours. Unresulted Labs (From admission, onward)     Start     Ordered   06/12/24 0500  Basic metabolic panel with GFR  Daily,   R      06/11/24 0905   06/12/24 0500  CBC  Daily,   R      06/11/24 0905   06/11/24 0500  Occult blood card to lab, stool  Daily,   R      06/10/24 1939           Antimicrobials/Microbiology: Anti-infectives (From admission, onward)    Start     Dose/Rate Route Frequency Ordered Stop   06/11/24 1000  vancomycin  (VANCOREADY) IVPB 1250 mg/250 mL       Placed in Followed by Linked Group   1,250 mg 166.7 mL/hr over 90 Minutes Intravenous Every 12 hours 06/10/24 2003 06/17/24 2159   06/10/24 2015  vancomycin  (VANCOREADY) IVPB 1500 mg/300 mL       Placed in Followed by Linked Group   1,500 mg 150 mL/hr over 120 Minutes Intravenous  Once 06/10/24 2003 06/11/24 0056   06/10/24 2000  vancomycin  (VANCOREADY) IVPB 1500 mg/300 mL  Status:  Discontinued        1,500 mg 150 mL/hr over 120 Minutes Intravenous Every 12 hours 06/10/24 1946 06/10/24 2002   06/10/24 1945  ceFEPIme  (MAXIPIME ) 2 g in sodium chloride  0.9 % 100 mL IVPB        2 g 200 mL/hr over 30 Minutes Intravenous Every 8 hours 06/10/24 1946 06/17/24 1959   06/10/24 1430  cefTRIAXone  (ROCEPHIN ) 1 g in sodium chloride  0.9 % 100 mL IVPB        1 g 200 mL/hr over 30 Minutes Intravenous  Once 06/10/24 1420 06/10/24 1522         Component Value Date/Time   SDES  03/20/2022 1030    PERITONEAL Performed at [facility], [address] [place], [place] [address]    [facility]  03/20/2022 1030     NONE Performed at [facility], [address] [place], [place] [address]    CULT  03/20/2022 1030    NO GROWTH 3 DAYS Performed at [facility], [address], [place], [place] [address]    REPTSTATUS 03/25/2022 FINAL 03/20/2022 1030    Procedures:  Mennie LAMY, MD [facility] 06/13/2024, 9:22 AM

## 2024-06-13 NOTE — Plan of Care (Signed)
  Problem: Education: Goal: Knowledge of General Education information will improve Description: Including pain rating scale, medication(s)/side effects and non-pharmacologic comfort measures Outcome: Progressing   Problem: Activity: Goal: Risk for activity intolerance will decrease Outcome: Progressing   Problem: Nutrition: Goal: Adequate nutrition will be maintained Outcome: Progressing   Problem: Coping: Goal: Level of anxiety will decrease Outcome: Progressing   Problem: Pain Managment: Goal: General experience of comfort will improve and/or be controlled Outcome: Progressing   Problem: Skin Integrity: Goal: Risk for impaired skin integrity will decrease Outcome: Progressing

## 2024-06-14 ENCOUNTER — Other Ambulatory Visit (HOSPITAL_COMMUNITY): Payer: Self-pay

## 2024-06-14 DIAGNOSIS — L03119 Cellulitis of unspecified part of limb: Secondary | ICD-10-CM | POA: Diagnosis not present

## 2024-06-14 LAB — BASIC METABOLIC PANEL WITH GFR
Anion gap: 10 (ref 5–15)
BUN: <5 mg/dL — ABNORMAL LOW (ref 8–23)
CO2: 24 mmol/L (ref 22–32)
Calcium: 8.5 mg/dL — ABNORMAL LOW (ref 8.9–10.3)
Chloride: 98 mmol/L (ref 98–111)
Creatinine, Ser: 0.33 mg/dL — ABNORMAL LOW (ref 0.61–1.24)
GFR, Estimated: >60 mL/min (ref >60)
Glucose, Bld: 100 mg/dL — ABNORMAL HIGH (ref 70–99)
Potassium: 3.7 mmol/L (ref 3.5–5.1)
Sodium: 132 mmol/L — ABNORMAL LOW (ref 135–145)

## 2024-06-14 LAB — CBC
HCT: 30.7 % — ABNORMAL LOW (ref 39.0–52.0)
Hemoglobin: 10 g/dL — ABNORMAL LOW (ref 13.0–17.0)
MCH: 34 pg (ref 26.0–34.0)
MCHC: 32.6 g/dL (ref 30.0–36.0)
MCV: 104.4 fL — ABNORMAL HIGH (ref 80.0–100.0)
Platelets: 208 K/uL (ref 150–400)
RBC: 2.94 MIL/uL — ABNORMAL LOW (ref 4.22–5.81)
RDW: 20.9 % — ABNORMAL HIGH (ref 11.5–15.5)
WBC: 4.5 K/uL (ref 4.0–10.5)
nRBC: 0 % (ref 0.0–0.2)

## 2024-06-14 MED ORDER — THIAMINE HCL 100 MG PO TABS
100.0000 mg | ORAL_TABLET | Freq: Every day | ORAL | 0 refills | Status: DC
  Filled 2024-06-14: qty 30, 30d supply, fill #0

## 2024-06-14 MED ORDER — AMOXICILLIN-POT CLAVULANATE 875-125 MG PO TABS
1.0000 | ORAL_TABLET | Freq: Two times a day (BID) | ORAL | 0 refills | Status: AC
  Filled 2024-06-14: qty 10, 5d supply, fill #0

## 2024-06-14 MED ORDER — FOLIC ACID 1 MG PO TABS
1.0000 mg | ORAL_TABLET | Freq: Every day | ORAL | 0 refills | Status: AC
  Filled 2024-06-14: qty 30, 30d supply, fill #0

## 2024-06-14 MED ORDER — FUROSEMIDE 20 MG PO TABS
20.0000 mg | ORAL_TABLET | Freq: Every day | ORAL | 0 refills | Status: DC
Start: 2024-06-14 — End: 2024-07-13
  Filled 2024-06-14: qty 30, 30d supply, fill #0

## 2024-06-14 MED ORDER — DILTIAZEM HCL ER COATED BEADS 120 MG PO CP24
120.0000 mg | ORAL_CAPSULE | Freq: Every day | ORAL | 0 refills | Status: AC
  Filled 2024-06-14: qty 30, 30d supply, fill #0

## 2024-06-14 MED ORDER — LINEZOLID 600 MG PO TABS
600.0000 mg | ORAL_TABLET | Freq: Two times a day (BID) | ORAL | 0 refills | Status: AC
  Filled 2024-06-14: qty 10, 5d supply, fill #0

## 2024-06-14 NOTE — Discharge Instructions (Signed)
 Orthopaedic Trauma Service Discharge Instructions   General Discharge Instructions  WEIGHT BEARING STATUS:Weightbearing as tolerated  RANGE OF MOTION/ACTIVITY: ok for hip motion as tolerated. Try to avoid excessive activity. Rest with your legs elevated frequently  Wound Care: Incisions can be left open to air if there is no drainage. Once the incision is completely dry and without drainage, it may be left open to air out.  Clean incision gently with soap and water  daily.  DVT/PE prophylaxis: Continue Eliquis   Diet: as you were eating previously.  Can use over the counter stool softeners and bowel preparations, such as Miralax , to help with bowel movements.  Narcotics can be constipating.  Be sure to drink plenty of fluids  PAIN MEDICATION USE AND EXPECTATIONS  You have likely been given narcotic medications to help control your pain.  After a traumatic event that results in an fracture (broken bone) with or without surgery, it is ok to use narcotic pain medications to help control one's pain.  We understand that everyone responds to pain differently and each individual patient will be evaluated on a regular basis for the continued need for narcotic medications. Ideally, narcotic medication use should last no more than 6-8 weeks (coinciding with fracture healing).   As a patient it is your responsibility as well to monitor narcotic medication use and report the amount and frequency you use these medications when you come to your office visit.   We would also advise that if you are using narcotic medications, you should take a dose prior to therapy to maximize you participation.  IF YOU ARE ON NARCOTIC MEDICATIONS IT IS NOT PERMISSIBLE TO OPERATE A MOTOR VEHICLE (MOTORCYCLE/CAR/TRUCK/MOPED) OR HEAVY MACHINERY DO NOT MIX NARCOTICS WITH OTHER CNS (CENTRAL NERVOUS SYSTEM) DEPRESSANTS SUCH AS ALCOHOL  POST-OPERATIVE OPIOID TAPER INSTRUCTIONS: It is important to wean off of your opioid  medication as soon as possible. If you do not need pain medication after your surgery it is ok to stop day one. Opioids include: Codeine, Hydrocodone (Norco, Vicodin), Oxycodone (Percocet, oxycontin ) and hydromorphone  amongst others.  Long term and even short term use of opiods can cause: Increased pain response Dependence Constipation Depression Respiratory depression And more.  Withdrawal symptoms can include Flu like symptoms Nausea, vomiting And more Techniques to manage these symptoms Hydrate well Eat regular healthy meals Stay active Use relaxation techniques(deep breathing, meditating, yoga) Do Not substitute Alcohol to help with tapering If you have been on opioids for less than two weeks and do not have pain than it is ok to stop all together.  Plan to wean off of opioids This plan should start within one week post op of your fracture surgery  Maintain the same interval or time between taking each dose and first decrease the dose.  Cut the total daily intake of opioids by one tablet each day Next start to increase the time between doses. The last dose that should be eliminated is the evening dose.    STOP SMOKING OR USING NICOTINE  PRODUCTS!!!!  As discussed nicotine  severely impairs your body's ability to heal surgical and traumatic wounds but also impairs bone healing.  Wounds and bone heal by forming microscopic blood vessels (angiogenesis) and nicotine  is a vasoconstrictor (essentially, shrinks blood vessels).  Therefore, if vasoconstriction occurs to these microscopic blood vessels they essentially disappear and are unable to deliver necessary nutrients to the healing tissue.  This is one modifiable factor that you can do to dramatically increase your chances of healing your injury.  (This  means no smoking, no nicotine  gum, patches, etc)  DO NOT USE NONSTEROIDAL ANTI-INFLAMMATORY DRUGS (NSAID'S)  Using products such as Advil (ibuprofen), Aleve (naproxen), Motrin  (ibuprofen) for additional pain control during fracture healing can delay and/or prevent the healing response.  If you would like to take over the counter (OTC) medication, Tylenol  (acetaminophen ) is ok.  However, some narcotic medications that are given for pain control contain acetaminophen  as well. Therefore, you should not exceed more than 4000 mg of tylenol  in a day if you do not have liver disease.  Also note that there are may OTC medicines, such as cold medicines and allergy medicines that my contain tylenol  as well.  If you have any questions about medications and/or interactions please ask your doctor/PA or your pharmacist.      ICE AND ELEVATE INJURED/OPERATIVE EXTREMITY  Using ice and elevating the injured extremity above your heart can help with swelling and pain control.  Icing in a pulsatile fashion, such as 20 minutes on and 20 minutes off, can be followed.    Do not place ice directly on skin. Make sure there is a barrier between to skin and the ice pack.    Using frozen items such as frozen peas works well as the conform nicely to the are that needs to be iced.  USE AN ACE WRAP OR TED HOSE FOR SWELLING CONTROL  In addition to icing and elevation, Ace wraps or TED hose are used to help limit and resolve swelling.  It is recommended to use Ace wraps or TED hose until you are informed to stop.    When using Ace Wraps start the wrapping distally (farthest away from the body) and wrap proximally (closer to the body)   Example: If you had surgery on your leg or thing and you do not have a splint on, start the ace wrap at the toes and work your way up to the thigh        If you had surgery on your upper extremity and do not have a splint on, start the ace wrap at your fingers and work your way up to the upper arm   CALL THE OFFICE FOR MEDICATION REFILLS OR WITH ANY QUESTIONS/CONCERNS: [phone]   VISIT OUR WEBSITE FOR ADDITIONAL INFORMATION: [email or web address]    Discharge Wound  Care Instructions  Do NOT apply any ointments, solutions or lotions to pin sites or surgical wounds.  These prevent needed drainage and even though solutions like hydrogen peroxide kill bacteria, they also damage cells lining the pin sites that help fight infection.  Applying lotions or ointments can keep the wounds moist and can cause them to breakdown and open up as well. This can increase the risk for infection. When in doubt call the office.  Surgical incisions should be dressed daily.  If any drainage is noted, use one layer of adaptic or Mepitel, then gauze, Kerlix, and an ace wrap. - These dressing supplies should be available at local medical supply stores ([facility], [facility], etc) as well as Insurance claims handler ([facility], [facility], [place], etc)  Once the incision is completely dry and without drainage, it may be left open to air out.  Showering may begin 36-48 hours later.  Cleaning gently with soap and water .   Call office for the following: Temperature greater than 101F Persistent nausea and vomiting Severe uncontrolled pain Redness, tenderness, or signs of infection (pain, swelling, redness, odor or green/yellow discharge around the site) Difficulty breathing, headache or visual disturbances  Hives Persistent dizziness or light-headedness Extreme fatigue Any other questions or concerns you may have after discharge  In an emergency, call 911 or go to an Emergency Department at a nearby hospital  OTHER HELPFUL INFORMATION  If you had a block, it will wear off between 8-24 hrs postop typically.  This is period when your pain may go from nearly zero to the pain you would have had postop without the block.  This is an abrupt transition but nothing dangerous is happening.  You may take an extra dose of narcotic when this happens.  You should wean off your narcotic medicines as soon as you are able.  Most patients will be off or using minimal narcotics before their first postop  appointment.   We suggest you use the pain medication the first night prior to going to bed, in order to ease any pain when the anesthesia wears off. You should avoid taking pain medications on an empty stomach as it will make you nauseous.  Do not drink alcoholic beverages or take illicit drugs when taking pain medications.  In most states it is against the law to drive while you are in a splint or sling.  And certainly against the law to drive while taking narcotics.  You may return to work/school in the next couple of days when you feel up to it.   Pain medication may make you constipated.  Below are a few solutions to try in this order: Decrease the amount of pain medication if you aren't having pain. Drink lots of decaffeinated fluids. Drink prune juice and/or each dried prunes  If the first 3 don't work start with additional solutions Take Colace - an over-the-counter stool softener Take Senokot - an over-the-counter laxative Take Miralax  - a stronger over-the-counter laxative

## 2024-06-14 NOTE — Progress Notes (Signed)
 Orthopaedic Trauma Progress Note  SUBJECTIVE: Doing okay this morning. Up walking in the hallway. Pain controlled. Feels like his swelling is improving. Wants to go home. States he can't stay here, has 6-12 cats to care for at home. Has not noticed any significant drainage from his anterior hip incision since last week.  No fever/chills.  No chest pain. No SOB. No nausea/vomiting. No other complaints. Patient noted to be hypokalemic and hyponatremic on morning labs.  Potassium being replaced today  OBJECTIVE:  Vitals:   06/14/24 0653 06/14/24 0838  BP: (!) 117/56 (!) 112/54  Pulse: 80 81  Resp: 18 17  Temp: 98.2 F (36.8 C) 97.6 F (36.4 C)  SpO2: 99% 99%    Opiates Today (MME): Today's  total administered Morphine  Milligram Equivalents: 0 Opiates Yesterday (MME): Yesterday's total administered Morphine  Milligram Equivalents: 7.5  General: Sitting up in bedside chair, no acute distress.  Pleasant and cooperative Respiratory: No increased work of breathing.  RLE: Slightly less erythema to the lower leg compared to late last week.  Redness has not extended beyond area of demarcation just below the knee.  Continued swelling throughout leg, improved some from exam. No active drainage from hip incision. Some slough noted at mid incision. Remainder of incision stable. With firm palpation, unable to express any purulent material.  Tolerates gentle knee and ankle range of motion.  2+ DP pulse.     IMAGING: No new imaging this hospitalization.  Pelvis/right hip imaging from [facility] clinic on 06/08/2024 stable  LABS:  No results found for this or any previous visit (from the past 24 hours).   ASSESSMENT: Jeffrey Randolph is a 68 y.o. male status post right THA on 05/22/2024, readmitted 06/10/2024 for BLE cellulitis  CV/Blood loss: Hgb 9.7 on 06/13/24. Hemodynamically stable  PLAN: Weightbearing: WBAT RLE ROM: Unrestricted ROM.  No hip precautions needed Incisional and dressing care: Change right hip  dressing as needed.  Clean around incision daily with soap and water  Orthopedic device(s): None  Pain management: Continue current regimen VTE prophylaxis: Eliquis , SCDs, ID: Vancomycin  and cefepime  Foley/Lines:  No foley, KVO IVFs Dispo: Continue care per primary team.  No surgical plans by orthopedics. Ok for d/c from ortho standpoint.   Follow - up plan: 7-10 days after d/c for wound check and repeat x-rays   Contact information:  Franky Light MD, Lauraine Moores PA-C. After hours and holidays please check [email or web address] for group call information for [facility]   Lauraine PATRIC Moores, PA-C [phone] (office) [email or web address]

## 2024-06-14 NOTE — Care Management Important Message (Signed)
 Important Message  Patient Details  Name: Jeffrey Randolph MRN: [id number] Date of Birth: 12/27/1955   Important Message Given:  Yes - Medicare IM     Jon Cruel 06/14/2024, 12:59 PM

## 2024-06-14 NOTE — Progress Notes (Signed)
 Mobility Specialist Progress Note:   06/14/24 1214  Mobility  Activity Ambulated with assistance  Level of Assistance Standby assist, set-up cues, supervision of patient - no hands on  Assistive Device Front wheel walker  Distance Ambulated (ft) 500 ft  RLE Weight Bearing Per Provider Order WBAT  Activity Response Tolerated well  Mobility Referral Yes  Mobility visit 1 Mobility  Mobility Specialist Start Time (ACUTE ONLY) 1045  Mobility Specialist Stop Time (ACUTE ONLY) 1100  Mobility Specialist Time Calculation (min) (ACUTE ONLY) 15 min   Received pt in chair having no complaints and agreeable to mobility. Pt was asymptomatic throughout ambulation and returned to room w/o fault. Left in chair w/ call bell in reach and all needs met.   Thersia Minder Mobility Specialist  Please contact vis Secure Chat or  Rehab Office [phone]

## 2024-06-14 NOTE — Discharge Summary (Signed)
 Physician Discharge Summary  Jeffrey Randolph FMW:[id number] DOB: 1955/10/31 DOA: 06/10/2024  PCP: Jeffrey Lot, PA-C  Admit date: 06/10/2024 Discharge date: 06/14/2024 Recommendations for Outpatient Follow-up:  Follow up with PCP in 1 weeks-call for appointment Fu cardio in 1 wk Please obtain BMP/CBC in one week  Discharge Dispo: Home Discharge Condition: Stable Code Status:   Code Status: Full Code Diet recommendation:  Diet Order             Diet - low sodium heart healthy           Diet regular Room service appropriate? Yes; Fluid consistency: Thin; Fluid restriction: 1200 mL Fluid  Diet effective now                    Brief/Interim Summary: Jeffrey Randolph is a 68 y.o. year old male with PMH of  HFpEF, atrial fibrillation on eliquis  , history of CVA, neuropathy, alcoholic liver cirrhosis, and Chronic hyponatremia last admitted on 8/15/ 2025 for right hip arthroplasty after a fall and trip over a cat in August 2025 a presented with progressive redness in both legs worse on right.Patient was seen for follow-up in orthopedic clinic visit and found to have increased redness and swelling around the surgical incision site with concerns of infection was given doxycycline.  However it has progressed and when seen by his heart doctor 9/4 for his a.fib he was recommended to come to the emergency room In the ED: Hemodynamically stable.  Labs showed hyponatremia hypokalemia element 3 lactic acidosis up to 2.8> subsequently resolved to 1.8, hemoglobin 10.2 UA unremarkable alcohol less than 15 Orthopedic consulted patient was given IV fluids albumin  antibiotics, placed on CIWA scale and admitted for further management. Patient denies any breast pain antibiotics, right hip operative site no signs of infection Bilateral lower leg edema along with associated cellulitis with CHF exacerbation managed with diuresis Overall leg swelling better and edema better, patient is requesting for discharge although he  would benefit with another day of IV diuretics but he is adamant on going home as he has animals at home that needs to be fed> he did not not want to stay any longer in the hospital As per his request discharged on oral antibiotics and Lasix  with instruction to follow-up with PCP and cardiology Vitals remained stable.  Subjective: Seen and examined Urine output 2440 negative 0.5l so far Overnight vital stable Labs pending this morning later resulted and stable He is adamant on leaving today  Discharge diagnosis:  Bilateral lower extremity cellulitis B/L Leg edema Status post right THA on 8/16: Orthopedic inputs appreciated - managed w/ vancomycin  and cefepime > changed to Zyvox  and ceftriaxone  9/7 to minimize nephrotoxicity. No plan for surgical intervention Continue PT OT, WBAT RLE and continue pain management.  Orthopedics following continue dressing changes on the right THA site-and are planning for outpatient follow-up/. No DVT on recent duplex 9/2 as outpatient Patient BNP is elevated concern for possible CHF given IV diuretics with improving on leg swelling At this time is requesting for discharge will transition to oral antibiotics and plan for discharge with instruction for outpatient follow-up with PCP cardiology and orthopedics  Acute on chronic HFpEF: Does have lower leg edema-BNP elevated. PTA not on Lasix . last echo from 2020 with EF 55-60%. Given IV diuresis overall leg swelling improving, will discharge on oral Lasix  advised to check BMP in 5 to 7 days from PCP or cardiology   Lactic acidosis likely from volume depletion.  Electrolytes stable  Hypokalemia Hypomagnesemia: Improved.    Anemia of chronic disease: previous baseline as low as 6.9-8.7, monitor.  Stable overall Recent Labs    04/16/24 0850 05/21/24 0657 06/10/24 1210 06/11/24 0421 06/12/24 0650 06/13/24 0627 06/14/24 0946  HGB  --    < > 10.2* 8.4* 9.3* 9.7* 10.0*  MCV  --    < > 103.7* 103.2*  104.8* 104.2* 104.4*  VITAMINB12 327  --   --   --   --   --   --    < > = values in this interval not displayed.   Chronic atrial fibrillation: History of CVA Neuropathy: Cont eliquis  Cardizem .  Alcoholic liver cirrhosis Jeffrey Randolph is at risk of withdrawal: counseled for cessation counseling.  Continue CIWA scale Ativan .  Mild AST elevation noted.  Monitor  Chronic hyponatremia Stable  Recent admisson on 8/15/ 2025 for right hip arthroplasty  Mobility: PT Orders:  PT Follow up Rec: Home Health Pt9/03/2024 1700    Mobility: PT Orders:  PT Follow up Rec: Home Health Pt9/03/2024 1700   DVT prophylaxis: Eliquis  Code Status:   Code Status: Full Code Family Communication: plan of care discussed with patient/ at bedside. Patient status is: Remains hospitalized because of severity of illness Level of care: Telemetry Medical   Dispo: The patient is from: home, lives independently            Anticipated disposition: TBD.  PT OT consulted Objective: Vitals last 24 hrs: Vitals:   06/13/24 1422 06/13/24 2016 06/14/24 0653 06/14/24 0838  BP: 125/63 134/71 (!) 117/56 (!) 112/54  Pulse: 70 86 80 81  Resp: 16 18 18 17   Temp: 98 F (36.7 C) 97.9 F (36.6 C) 98.2 F (36.8 C) 97.6 F (36.4 C)  TempSrc: Oral Oral Oral   SpO2: 100% 100% 99% 99%  Weight:      Height:        Physical Examination: General exam: aaox3 HEENT:Oral mucosa moist, Ear/Nose WNL grossly Respiratory system: Bilaterally clear BS,no use of accessory muscle Cardiovascular system: S1 & S2 +, No JVD. Gastrointestinal system: Abdomen soft,NT,ND, BS+ Nervous System: Alert, awake, moving all extremities,and following commands. Extremities: Bilateral lower extremity pitting edema with erythema more on right than left  Skin: No rashes,no icterus. MSK: Normal muscle bulk,tone, power      Consultation: See note.  Discharge Instructions  Discharge Instructions     (HEART FAILURE PATIENTS) Call MD:  Anytime you  have any of the following symptoms: 1) 3 pound weight gain in 24 hours or 5 pounds in 1 week 2) shortness of breath, with or without a dry hacking cough 3) swelling in the hands, feet or stomach 4) if you have to sleep on extra pillows at night in order to breathe.   Complete by: As directed    Diet - low sodium heart healthy   Complete by: As directed    Discharge instructions   Complete by: As directed    Please follow-up with your primary care doctor or cardiology IN 5-7 DAYS WHILE ON LASIX   Check your weight daily.  Please call call MD or return to ER for similar or worsening recurring problem that brought you to hospital or if any fever,nausea/vomiting,abdominal pain, uncontrolled pain, chest pain,  shortness of breath or any other alarming symptoms.  Please follow-up your doctor as instructed in a week time and call the office for appointment.  Please avoid alcohol, smoking, or any other illicit substance and maintain healthy habits including taking your  regular medications as prescribed.  You were cared for by a hospitalist during your hospital stay. If you have any questions about your discharge medications or the care you received while you were in the hospital after you are discharged, you can call the unit and ask to speak with the hospitalist on call if the hospitalist that took care of you is not available.  Once you are discharged, your primary care physician will handle any further medical issues. Please note that NO REFILLS for any discharge medications will be authorized once you are discharged, as it is imperative that you return to your primary care physician (or establish a relationship with a primary care physician if you do not have one) for your aftercare needs so that they can reassess your need for medications and monitor your lab values   Increase activity slowly   Complete by: As directed       Allergies as of 06/14/2024   No Known Allergies      Medication List      STOP taking these medications    doxycycline 100 MG tablet Commonly known as: VIBRA-TABS       TAKE these medications    acetaminophen  325 MG tablet Commonly known as: TYLENOL  Take 2 tablets (650 mg total) by mouth every 6 (six) hours as needed for mild pain (pain score 1-3), fever or headache.   amoxicillin -clavulanate 875-125 MG tablet Commonly known as: AUGMENTIN  Take 1 tablet by mouth 2 (two) times daily for 5 days.   apixaban  5 MG Tabs tablet Commonly known as: ELIQUIS  Take 1 tablet (5 mg total) by mouth 2 (two) times daily.   chlorhexidine  4 % external liquid Commonly known as: HIBICLENS  Apply 15 mLs (1 Application total) topically as directed for 30 doses. Use as directed daily for 5 days every other week for 6 weeks.   cyanocobalamin  1000 MCG tablet Commonly known as: VITAMIN B12 Take 1 tablet (1,000 mcg total) by mouth daily. What changed: how much to take   diltiazem  120 MG 24 hr capsule Commonly known as: CARDIZEM  CD Take 1 capsule (120 mg total) by mouth daily. Start taking on: June 15, 2024 What changed:  medication strength how much to take   folic acid  1 MG tablet Commonly known as: FOLVITE  Take 1 tablet (1 mg total) by mouth daily. Start taking on: June 15, 2024   furosemide  20 MG tablet Commonly known as: Lasix  Take 1 tablet (20 mg total) by mouth daily.   lactulose  10 GM/15ML solution Commonly known as: CHRONULAC  Take 20 g by mouth 2 (two) times daily.   linezolid  600 MG tablet Commonly known as: ZYVOX  Take 1 tablet (600 mg total) by mouth every 12 (twelve) hours for 5 days.   methocarbamol  500 MG tablet Commonly known as: ROBAXIN  Take 500 mg by mouth daily as needed for muscle spasms.   metoprolol  succinate 25 MG 24 hr tablet Commonly known as: TOPROL -XL Take 0.5 tablets (12.5 mg total) by mouth daily.   mupirocin  ointment 2 % Commonly known as: BACTROBAN  Place 1 Application into the nose 2 (two) times daily for 60  doses. Use as directed 2 times daily for 5 days every other week for 6 weeks.   omeprazole 40 MG capsule Commonly known as: PRILOSEC Take 40 mg by mouth daily.   oxyCODONE  5 MG immediate release tablet Commonly known as: Oxy IR/ROXICODONE  Take 0.5-1 tablets (2.5-5 mg total) by mouth every 4 (four) hours as needed for moderate pain (pain score  4-6) or severe pain (pain score 7-10) (2.5 mg pain score 4-6, 5 mg pain score 7-10).   pregabalin  200 MG capsule Commonly known as: LYRICA  Take 1 capsule (200 mg total) by mouth 3 (three) times daily. What changed:  when to take this reasons to take this   rOPINIRole  0.5 MG tablet Commonly known as: REQUIP  Take 1 mg by mouth at bedtime.   thiamine  100 MG tablet Commonly known as: VITAMIN B1 Take 1 tablet (100 mg total) by mouth daily. Start taking on: June 15, 2024   vitamin D3 25 MCG tablet Commonly known as: CHOLECALCIFEROL  Take 1 tablet (1,000 Units total) by mouth daily.        Follow-up Information     Jeffrey Lot, PA-C Follow up in 1 week(s).   Specialty: Physician Assistant Contact information: [address] [place] [address] [phone]         Kendal Franky SQUIBB, MD. Schedule an appointment as soon as possible for a visit in 1 week(s).   Specialty: Orthopedic Surgery Why: for wound check and repeat x-rays Contact information: [address] [place] [address] [phone]                No Known Allergies  The results of significant diagnostics from this hospitalization (including imaging, microbiology, ancillary and laboratory) are listed below for reference.    Microbiology: No results found for this or any previous visit (from the past 240 hours).  Procedures/Studies: VAS US  LOWER EXTREMITY VENOUS (DVT) Result Date: 06/08/2024  Lower Venous DVT Study Patient Name:  Taison Celani  Date of Exam:   06/08/2024 Medical Rec #: [id number]    Accession #:    [id number] Date of Birth: 09/17/1956     Patient Gender: M Patient Age:   85 years Exam Location:  [address] Procedure:      VAS US  LOWER EXTREMITY VENOUS (DVT) Referring Phys: KEVIN HADDIX --------------------------------------------------------------------------------  Other Indications: Patient with recent right hip replacement presents with                    swelling, pain and redness of the right lower extremity. Limitations: Poor ultrasound/tissue interface. Performing Technologist: Edsel Mustard RVT  Examination Guidelines: A complete evaluation includes B-mode imaging, spectral Doppler, color Doppler, and power Doppler as needed of all accessible portions of each vessel. Bilateral testing is considered an integral part of a complete examination. Limited examinations for reoccurring indications may be performed as noted. The reflux portion of the exam is performed with the patient in reverse Trendelenburg.  +---------+---------------+---------+-----------+----------+--------------+ RIGHT    CompressibilityPhasicitySpontaneityPropertiesThrombus Aging +---------+---------------+---------+-----------+----------+--------------+ CFV      Full           Yes      Yes                                 +---------+---------------+---------+-----------+----------+--------------+ SFJ      Full           Yes      Yes                                 +---------+---------------+---------+-----------+----------+--------------+ FV Prox  Full           Yes      Yes                                 +---------+---------------+---------+-----------+----------+--------------+  FV Mid   Full           Yes      Yes                                 +---------+---------------+---------+-----------+----------+--------------+ FV DistalFull                                                        +---------+---------------+---------+-----------+----------+--------------+ PFV      Full                                                         +---------+---------------+---------+-----------+----------+--------------+ POP      Full           Yes      Yes                                 +---------+---------------+---------+-----------+----------+--------------+ PTV      Full                                                        +---------+---------------+---------+-----------+----------+--------------+ PERO     Full                                                        +---------+---------------+---------+-----------+----------+--------------+ Gastroc  Full                                                        +---------+---------------+---------+-----------+----------+--------------+ GSV      Full           Yes      Yes                                 +---------+---------------+---------+-----------+----------+--------------+   +----+---------------+---------+-----------+----------+--------------+ LEFTCompressibilityPhasicitySpontaneityPropertiesThrombus Aging +----+---------------+---------+-----------+----------+--------------+ CFV Full           Yes      Yes                                 +----+---------------+---------+-----------+----------+--------------+   Findings reported to office via EPIC fax, office was closed during attempted phone call for results.  Summary: RIGHT: - No evidence of deep vein thrombosis in the lower extremity. No indirect evidence of obstruction proximal to the inguinal ligament.   LEFT: - No evidence of common femoral vein obstruction.   *See table(s) above for measurements and observations. Electronically signed by Lonni Gaskins  MD on 06/08/2024 at 2:42:22 PM.    Final    DG HIP UNILAT W OR W/O PELVIS 2-3 VIEWS RIGHT Result Date: 05/21/2024 CLINICAL DATA:  Status post total hip arthroplasty EXAM: DG HIP (WITH OR WITHOUT PELVIS) 2-3V RIGHT COMPARISON:  Radiographs 05/21/2024 FINDINGS: Right hip THA. No evidence of loosening. Postoperative gas in the right  femur. Vascular calcifications. IMPRESSION: Right hip THA. Electronically Signed   By: Norman Gatlin M.D.   On: 05/21/2024 23:26   DG HIP UNILAT WITH PELVIS 1V RIGHT Result Date: 05/21/2024 CLINICAL DATA:  Right hip replacement EXAM: DG HIP (WITH OR WITHOUT PELVIS) 1V RIGHT COMPARISON:  05/21/2024 FINDINGS: Multiple intraoperative spot images demonstrate changes of right hip replacement. No hardware bony complicating feature. IMPRESSION: Right hip replacement.  No visible complicating feature. Electronically Signed   By: Franky Crease M.D.   On: 05/21/2024 22:01   DG C-Arm 1-60 Min-No Report Result Date: 05/21/2024 Fluoroscopy was utilized by the requesting physician.  No radiographic interpretation.   DG C-Arm 1-60 Min-No Report Result Date: 05/21/2024 Fluoroscopy was utilized by the requesting physician.  No radiographic interpretation.   CT HEAD WO CONTRAST Result Date: 05/21/2024 EXAM: CT HEAD AND CERVICAL SPINE 05/21/2024 07:46:47 AM TECHNIQUE: CT of the head and cervical spine was performed without the administration of intravenous contrast. Multiplanar reformatted images are provided for review. Automated exposure control, iterative reconstruction, and/or weight based adjustment of the mA/kV was utilized to reduce the radiation dose to as low as reasonably achievable. COMPARISON: CT head and cervical spine 04/14/2024. MRI cervical spine 04/15/2024. CLINICAL HISTORY: Head trauma, moderate-severe. PT brought in by EMS for a fall at home. PT is complaining of right hip/leg pain. PT also hit his head on back right side on fall and is taking blood thinners elquis. PT is alert and talking GCS 15. PT states no LOC and was given pain meds en route. PT placed in [place] per md upon arrival to ed. PT VSS. Level 2 fall on thinners. FINDINGS: CT HEAD BRAIN AND VENTRICLES: No acute intracranial hemorrhage. No mass effect or midline shift. No abnormal extra-axial fluid collection. Gray-white differentiation is  maintained. No hydrocephalus. Patchy hypodensities in the cerebral white matter bilaterally, similar to the prior CT and nonspecific but compatible with moderate chronic small vessel ischemic disease. Chronic lacunar infarct at the posterior aspect of the right putamen. Mild cerebral atrophy. ORBITS: Bilateral cataract extraction. SINUSES AND MASTOIDS: Unchanged posterior left ethmoid air cell opacification. Clear mastoid air cells. SOFT TISSUES AND SKULL: No acute skull fracture. No acute soft tissue abnormality. CT CERVICAL SPINE BONES AND ALIGNMENT: No acute fracture. Unchanged trace retrolisthesis of C3 on C4. DEGENERATIVE CHANGES: Similar appearance of multilevel disc degeneration compared to the recent prior studies with spinal stenosis from C3-4 through C5-6, including a moderately large central disc protrusion at C4-5 resulting in moderate spinal stenosis and a broad-based posterior disc osteophyte complex at C3-4 resulting in severe right greater than left neural foraminal stenosis. SOFT TISSUES: No prevertebral soft tissue swelling. VASCULATURE: Mild-to-moderate atherosclerotic calcification about the carotid bifurcations. IMPRESSION: 1. No acute intracranial abnormality. 2. No acute fracture or traumatic malalignment of the cervical spine. Electronically signed by: Dasie Hamburg MD 05/21/2024 08:38 AM EDT RP Workstation: [id number]   CT CERVICAL SPINE WO CONTRAST Result Date: 05/21/2024 EXAM: CT HEAD AND CERVICAL SPINE 05/21/2024 07:46:47 AM TECHNIQUE: CT of the head and cervical spine was performed without the administration of intravenous contrast. Multiplanar reformatted images are provided for review. Automated exposure  control, iterative reconstruction, and/or weight based adjustment of the mA/kV was utilized to reduce the radiation dose to as low as reasonably achievable. COMPARISON: CT head and cervical spine 04/14/2024. MRI cervical spine 04/15/2024. CLINICAL HISTORY: Head trauma,  moderate-severe. PT brought in by EMS for a fall at home. PT is complaining of right hip/leg pain. PT also hit his head on back right side on fall and is taking blood thinners elquis. PT is alert and talking GCS 15. PT states no LOC and was given pain meds en route. PT placed in [place] per md upon arrival to ed. PT VSS. Level 2 fall on thinners. FINDINGS: CT HEAD BRAIN AND VENTRICLES: No acute intracranial hemorrhage. No mass effect or midline shift. No abnormal extra-axial fluid collection. Gray-white differentiation is maintained. No hydrocephalus. Patchy hypodensities in the cerebral white matter bilaterally, similar to the prior CT and nonspecific but compatible with moderate chronic small vessel ischemic disease. Chronic lacunar infarct at the posterior aspect of the right putamen. Mild cerebral atrophy. ORBITS: Bilateral cataract extraction. SINUSES AND MASTOIDS: Unchanged posterior left ethmoid air cell opacification. Clear mastoid air cells. SOFT TISSUES AND SKULL: No acute skull fracture. No acute soft tissue abnormality. CT CERVICAL SPINE BONES AND ALIGNMENT: No acute fracture. Unchanged trace retrolisthesis of C3 on C4. DEGENERATIVE CHANGES: Similar appearance of multilevel disc degeneration compared to the recent prior studies with spinal stenosis from C3-4 through C5-6, including a moderately large central disc protrusion at C4-5 resulting in moderate spinal stenosis and a broad-based posterior disc osteophyte complex at C3-4 resulting in severe right greater than left neural foraminal stenosis. SOFT TISSUES: No prevertebral soft tissue swelling. VASCULATURE: Mild-to-moderate atherosclerotic calcification about the carotid bifurcations. IMPRESSION: 1. No acute intracranial abnormality. 2. No acute fracture or traumatic malalignment of the cervical spine. Electronically signed by: Dasie Hamburg MD 05/21/2024 08:38 AM EDT RP Workstation: [id number]   DG Femur Min 2 Views Right Result Date:  05/21/2024 EXAM: 2 VIEW(S) XRAY OF THE RIGHT FEMUR 05/21/2024 08:30:00 AM COMPARISON: None available. CLINICAL HISTORY: Fracture. Reason for exam: fall; Best images obtainable due to patient condition and images approved by bedside physician. FINDINGS: BONES AND JOINTS: There is a mildly displaced femoral neck fracture. There is mild osteoarthritis. SOFT TISSUES: There are vascular calcifications. IMPRESSION: 1. Right femoral neck fracture, mildly displaced. 2. Mild osteoarthritis. Electronically signed by: evalene coho 05/21/2024 08:35 AM EDT RP Workstation: [id number]   CT CHEST ABDOMEN PELVIS WO CONTRAST Result Date: 05/21/2024 CLINICAL DATA:  Patient fell at home.  Right hip and leg pain EXAM: CT CHEST, ABDOMEN AND PELVIS WITHOUT CONTRAST TECHNIQUE: Multidetector CT imaging of the chest, abdomen and pelvis was performed following the standard protocol without IV contrast. RADIATION DOSE REDUCTION: This exam was performed according to the departmental dose-optimization program which includes automated exposure control, adjustment of the mA and/or kV according to patient size and/or use of iterative reconstruction technique. COMPARISON:  04/15/2024 FINDINGS: CT CHEST FINDINGS Cardiovascular: The heart size is normal. No substantial pericardial effusion. Coronary artery calcification is evident. Mild atherosclerotic calcification is noted in the wall of the thoracic aorta. Mediastinum/Nodes: No mediastinal lymphadenopathy. No evidence for gross hilar lymphadenopathy although assessment is limited by the lack of intravenous contrast on the current study. The esophagus has normal imaging features. There is no axillary lymphadenopathy. Lungs/Pleura: Multiple tiny right lung nodules are stable comparing back to a study from 05/24/2023, measuring up to about 3 mm in the right middle lobe (100/5) interval stability compatible with benign etiology. No followup  imaging is recommended. Dependent atelectasis noted in  both lower lobes. Small right pleural effusion slightly increased since 04/15/2024. Musculoskeletal: See report for dedicated thoracic spine CT dictated separately. No worrisome lytic or sclerotic osseous abnormality. CT ABDOMEN PELVIS FINDINGS Hepatobiliary: No suspicious focal abnormality in the liver on this study without intravenous contrast. Subtle nodularity of liver contour suggests cirrhosis in apparent recanalization of the paraumbilical vein is suspicious for portal venous hypertension. Calcified gallstones measure up to 9 mm. No intrahepatic or extrahepatic biliary dilation. Pancreas: No focal mass lesion. No dilatation of the main duct. No intraparenchymal cyst. No peripancreatic edema. Spleen: No splenomegaly. No suspicious focal mass lesion. Adrenals/Urinary Tract: No adrenal nodule or mass. Kidneys unremarkable. No evidence for hydroureter. The urinary bladder appears normal for the degree of distention. Stomach/Bowel: Stomach is unremarkable. No gastric wall thickening. No evidence of outlet obstruction. There is some subtle Peri duodenal stranding, mainly around the descending segment in similar to prior study. No small bowel wall thickening. No small bowel dilatation. The terminal ileum is normal. The appendix is not well visualized, but there is no edema or inflammation in the region of the cecal tip to suggest appendicitis. No gross colonic mass. No colonic wall thickening. Vascular/Lymphatic: There is moderate atherosclerotic calcification of the abdominal aorta without aneurysm. Increased number of lymph nodes are seen in the central small bowel mesentery, similar to prior in some of these lymph nodes are upper normal to borderline enlarged, stable. No pelvic sidewall lymphadenopathy. Reproductive: The prostate gland and seminal vesicles are unremarkable. Other: No intraperitoneal free fluid. Haziness in the retroperitoneal tissues of the abdomen in central small bowel mesentery is similar.  Musculoskeletal: No worrisome lytic or sclerotic osseous abnormality. Acute right femoral neck fracture is in the subcapital to transcervical location with mild comminution and varus angulation. See report for dedicated lumbar spine CT dictated separately. IMPRESSION: 1. Acute right femoral neck fracture in the subcapital to transcervical location with mild comminution and varus angulation. 2. No other acute traumatic injury in the chest, abdomen, or pelvis. See report for dedicated thoracolumbar spine CT dictated separately. 3. Small right pleural effusion, slightly increased since 04/15/2024. 4. Cirrhotic liver morphology. 5. Cholelithiasis. 6. Stable haziness in the retroperitoneal tissues of the abdomen and central small bowel mesentery with increased number of lymph nodes in the central small bowel mesentery, some of which are upper normal to borderline enlarged. Findings are stable since prior. Imaging features are nonspecific but can be seen in the setting of mesenteric adenitis or panniculitis. 7.  Aortic Atherosclerosis (ICD10-I70.0). Electronically Signed   By: Camellia Candle M.D.   On: 05/21/2024 08:08   CT T-SPINE NO CHARGE Result Date: 05/21/2024 EXAM: CT THORACIC SPINE WITHOUT CONTRAST 05/21/2024 07:46:47 AM TECHNIQUE: CT of the thoracic spine was performed without the administration of intravenous contrast. Multiplanar reformatted images are provided for review. Automated exposure control, iterative reconstruction, and/or weight based adjustment of the mA/kV was utilized to reduce the radiation dose to as low as reasonably achievable. COMPARISON: CT of the thoracic spine dated 05/24/2023. CLINICAL HISTORY: Mid-back pain. Triage notes; PT brought in by EMS for a fall at home. PT is complaining of right hip/leg pain. PT also hit his head on back right side on fall and is taking blood thinners elquis. PT is alert and talking GCS 15. PT states no LOC and was given pain meds en route see ; charting. PT  placed in [place] per md upon arrival to ed. PT VSS ; Level 2  fall on thinners FINDINGS: BONES AND ALIGNMENT: Normal vertebral body heights. No acute fracture or suspicious bone lesion. Normal alignment. DEGENERATIVE CHANGES: Mild chronic degenerative disc disease throughout the mid and lower thoracic spine. SOFT TISSUES: Mild right-sided pleural effusion. IMPRESSION: 1. No acute abnormality of the thoracic spine related to the reported fall. 2. Mild chronic degenerative disc disease throughout the mid and lower thoracic spine. 3. Mild right-sided pleural effusion. Electronically signed by: evalene coho 05/21/2024 08:05 AM EDT RP Workstation: [id number]   CT L-SPINE NO CHARGE Result Date: 05/21/2024 EXAM: CT OF THE LUMBAR SPINE WITHOUT CONTRAST 05/21/2024 07:46:47 AM TECHNIQUE: CT of the lumbar spine was performed without the administration of intravenous contrast. Multiplanar reformatted images are provided for review. Automated exposure control, iterative reconstruction, and/or weight based adjustment of the mA/kV was utilized to reduce the radiation dose to as low as reasonably achievable. COMPARISON: CT of the lumbar spine dated 04/15/2024. CLINICAL HISTORY: PT brought in by EMS for a fall at home. PT is complaining of right hip/leg pain. PT also hit his head on back right side on fall and is taking blood thinners elquis. PT is alert and talking GCS 15. PT states no LOC and was given pain meds en route see charting. PT placed in [facility] per md upon arrival to ed. PT VSS. Level 2 fall on thinners. FINDINGS: BONES AND ALIGNMENT: There is a mild rotatory levocurvature of the lumbar spine. There is a chronic superior endplate compression deformity of L1. DEGENERATIVE CHANGES: There is moderate chronic degenerative disc disease at L2-3, with posterior disc bulging and endplate ridging. There is also chronic degenerative disc disease at L4-5 with mild central spinal canal stenosis. SOFT TISSUES: There is a mild  right-sided pleural effusion. IMPRESSION: 1. No acute traumatic injury. 2. Chronic superior endplate compression deformity of L1. 3. Moderate chronic degenerative disc disease at L2-3 with posterior disc bulging and endplate ridging. 4. Chronic degenerative disc disease at L4-5 with mild central spinal canal stenosis. Electronically signed by: evalene coho 05/21/2024 08:03 AM EDT RP Workstation: [id number]   DG Pelvis Portable Result Date: 05/21/2024 EXAM: 1 or 2 VIEW(S) XRAY OF THE PELVIS 05/21/2024 07:15:00 AM COMPARISON: 05/24/2023 CLINICAL HISTORY: Trauma. Pt lost balance and fell on right hip then fell backwards and hit back of head. FINDINGS: BONES AND JOINTS: There is a deformity of the right hip compatible with acute subcapital femoral neck fracture with mild superior displacement and impaction of the fracture fragments. No additional fracture or dislocation. Degenerative changes in the lumbar spine and left hip. SOFT TISSUES: Atherosclerotic calcifications. IMPRESSION: 1. Acute subcapital femoral neck fracture of the right hip with mild superior displacement and impaction of the fracture fragments. 2. No additional fracture or dislocation. Electronically signed by: Waddell Calk MD 05/21/2024 07:45 AM EDT RP Workstation: [id number]   DG Chest Port 1 View Result Date: 05/21/2024 EXAM: 1 VIEW XRAY OF THE CHEST 05/21/2024 07:15:00 AM COMPARISON: 05/21/2024 CLINICAL HISTORY: Trauma. Pt lost balance and fell on right hip then fell backwards and hit back of head. FINDINGS: LUNGS AND PLEURA: Mild scar versus subsegmental atelectasis noted within the left base. Trace right pleural fluid with blunting of the right costophrenic angle. No pneumothorax identified. HEART AND MEDIASTINUM: Normal cardiac and mediastinal silhouette. BONES AND SOFT TISSUES: No acute osseous abnormality. IMPRESSION: 1. Mild scar versus subsegmental atelectasis in the left base. 2. Trace right pleural fluid with blunting of the right  costophrenic angle. Electronically signed by: Waddell Calk MD 05/21/2024 07:43 AM EDT RP  Workstation: [id number]    Labs: BNP (last 3 results) Recent Labs    06/12/24 0650  BNP 382.6*   Basic Metabolic Panel: Recent Labs  Lab 06/10/24 1210 06/11/24 0421 06/11/24 1905 06/12/24 0650 06/13/24 0627 06/14/24 0946  NA 132* 130*  --  132* 132* 132*  K 3.1* 2.9* 3.8 3.8 4.4 3.7  CL 96* 99  --  101 100 98  CO2 22 23  --  22 24 24   GLUCOSE 113* 98  --  114* 118* 100*  BUN <5* <5*  --  <5* <5* <5*  CREATININE 0.60* 0.49*  --  0.52* 0.54* 0.33*  CALCIUM 8.3* 8.1*  --  8.3* 8.6* 8.5*  MG  --  1.7  --  1.9  --   --   PHOS  --  3.5  --  3.1  --   --    Liver Function Tests: Recent Labs  Lab 06/10/24 1209 06/11/24 0421  AST 47* 26  ALT 17 14  ALKPHOS 160* 119  BILITOT 1.3* 1.5*  PROT 6.4* 5.6*  ALBUMIN  3.0* 3.0*   No results for input(s): LIPASE, AMYLASE in the last 168 hours. No results for input(s): AMMONIA in the last 168 hours. CBC: Recent Labs  Lab 06/10/24 1210 06/11/24 0421 06/12/24 0650 06/13/24 0627 06/14/24 0946  WBC 6.0 3.8* 6.7 4.7 4.5  NEUTROABS 4.1 2.5  --   --   --   HGB 10.2* 8.4* 9.3* 9.7* 10.0*  HCT 30.8* 25.8* 28.4* 29.5* 30.7*  MCV 103.7* 103.2* 104.8* 104.2* 104.4*  PLT 222 167 170 179 208  Urinalysis    Component Value Date/Time   COLORURINE STRAW (A) 06/10/2024 1020   APPEARANCEUR CLEAR 06/10/2024 1020   LABSPEC 1.002 (L) 06/10/2024 1020   PHURINE 8.0 06/10/2024 1020   GLUCOSEU NEGATIVE 06/10/2024 1020   HGBUR NEGATIVE 06/10/2024 1020   BILIRUBINUR NEGATIVE 06/10/2024 1020   KETONESUR NEGATIVE 06/10/2024 1020   PROTEINUR NEGATIVE 06/10/2024 1020   UROBILINOGEN 0.2 08/03/2011 1937   NITRITE NEGATIVE 06/10/2024 1020   LEUKOCYTESUR NEGATIVE 06/10/2024 1020   Sepsis Labs Recent Labs  Lab 06/11/24 0421 06/12/24 0650 06/13/24 0627 06/14/24 0946  WBC 3.8* 6.7 4.7 4.5   Microbiology No results found for this or any previous  visit (from the past 240 hours).  Time coordinating discharge: 35 minutes  SIGNED: Mennie LAMY, MD  [facility] 06/14/2024, 2:00 PM  If 7PM-7AM, please contact night-coverage [email or web address]

## 2024-06-14 NOTE — Plan of Care (Signed)

## 2024-06-22 DIAGNOSIS — S72031D Displaced midcervical fracture of right femur, subsequent encounter for closed fracture with routine healing: Secondary | ICD-10-CM | POA: Diagnosis not present

## 2024-06-25 DIAGNOSIS — M4802 Spinal stenosis, cervical region: Secondary | ICD-10-CM | POA: Diagnosis not present

## 2024-06-25 DIAGNOSIS — Z79899 Other long term (current) drug therapy: Secondary | ICD-10-CM | POA: Diagnosis not present

## 2024-06-25 DIAGNOSIS — L03115 Cellulitis of right lower limb: Secondary | ICD-10-CM | POA: Diagnosis not present

## 2024-06-25 DIAGNOSIS — I4891 Unspecified atrial fibrillation: Secondary | ICD-10-CM | POA: Diagnosis not present

## 2024-06-25 DIAGNOSIS — L03116 Cellulitis of left lower limb: Secondary | ICD-10-CM | POA: Diagnosis not present

## 2024-06-25 DIAGNOSIS — I5030 Unspecified diastolic (congestive) heart failure: Secondary | ICD-10-CM | POA: Diagnosis not present

## 2024-06-25 DIAGNOSIS — K7460 Unspecified cirrhosis of liver: Secondary | ICD-10-CM | POA: Diagnosis not present

## 2024-06-25 DIAGNOSIS — Z96641 Presence of right artificial hip joint: Secondary | ICD-10-CM | POA: Diagnosis not present

## 2024-06-25 DIAGNOSIS — R188 Other ascites: Secondary | ICD-10-CM | POA: Diagnosis not present

## 2024-06-25 DIAGNOSIS — G2581 Restless legs syndrome: Secondary | ICD-10-CM | POA: Diagnosis not present

## 2024-07-02 DIAGNOSIS — E871 Hypo-osmolality and hyponatremia: Secondary | ICD-10-CM | POA: Diagnosis not present

## 2024-07-02 DIAGNOSIS — D649 Anemia, unspecified: Secondary | ICD-10-CM | POA: Diagnosis not present

## 2024-07-12 ENCOUNTER — Emergency Department (HOSPITAL_COMMUNITY)

## 2024-07-12 ENCOUNTER — Other Ambulatory Visit: Payer: Self-pay

## 2024-07-12 ENCOUNTER — Encounter (HOSPITAL_COMMUNITY): Payer: Self-pay

## 2024-07-12 ENCOUNTER — Inpatient Hospital Stay (HOSPITAL_COMMUNITY)
Admission: EM | Admit: 2024-07-12 | Discharge: 2024-07-16 | DRG: 871 | Disposition: A | Discharge status: Home / Self Care | Attending: Family Medicine | Admitting: Family Medicine

## 2024-07-12 DIAGNOSIS — K219 Gastro-esophageal reflux disease without esophagitis: Secondary | ICD-10-CM | POA: Diagnosis present

## 2024-07-12 DIAGNOSIS — Z9181 History of falling: Secondary | ICD-10-CM

## 2024-07-12 DIAGNOSIS — K6389 Other specified diseases of intestine: Secondary | ICD-10-CM | POA: Diagnosis not present

## 2024-07-12 DIAGNOSIS — J189 Pneumonia, unspecified organism: Secondary | ICD-10-CM | POA: Diagnosis present

## 2024-07-12 DIAGNOSIS — A4189 Other specified sepsis: Secondary | ICD-10-CM | POA: Diagnosis not present

## 2024-07-12 DIAGNOSIS — K8020 Calculus of gallbladder without cholecystitis without obstruction: Secondary | ICD-10-CM | POA: Diagnosis present

## 2024-07-12 DIAGNOSIS — R188 Other ascites: Secondary | ICD-10-CM | POA: Diagnosis not present

## 2024-07-12 DIAGNOSIS — I69359 Hemiplegia and hemiparesis following cerebral infarction affecting unspecified side: Secondary | ICD-10-CM | POA: Diagnosis not present

## 2024-07-12 DIAGNOSIS — R531 Weakness: Secondary | ICD-10-CM | POA: Diagnosis not present

## 2024-07-12 DIAGNOSIS — J90 Pleural effusion, not elsewhere classified: Secondary | ICD-10-CM | POA: Diagnosis present

## 2024-07-12 DIAGNOSIS — Z8673 Personal history of transient ischemic attack (TIA), and cerebral infarction without residual deficits: Secondary | ICD-10-CM

## 2024-07-12 DIAGNOSIS — B356 Tinea cruris: Secondary | ICD-10-CM | POA: Diagnosis not present

## 2024-07-12 DIAGNOSIS — F1020 Alcohol dependence, uncomplicated: Secondary | ICD-10-CM | POA: Diagnosis present

## 2024-07-12 DIAGNOSIS — N400 Enlarged prostate without lower urinary tract symptoms: Secondary | ICD-10-CM | POA: Diagnosis present

## 2024-07-12 DIAGNOSIS — A045 Campylobacter enteritis: Secondary | ICD-10-CM | POA: Diagnosis present

## 2024-07-12 DIAGNOSIS — G629 Polyneuropathy, unspecified: Secondary | ICD-10-CM | POA: Diagnosis present

## 2024-07-12 DIAGNOSIS — K7460 Unspecified cirrhosis of liver: Secondary | ICD-10-CM | POA: Diagnosis present

## 2024-07-12 DIAGNOSIS — I5032 Chronic diastolic (congestive) heart failure: Secondary | ICD-10-CM | POA: Diagnosis not present

## 2024-07-12 DIAGNOSIS — K7469 Other cirrhosis of liver: Secondary | ICD-10-CM | POA: Diagnosis not present

## 2024-07-12 DIAGNOSIS — J9601 Acute respiratory failure with hypoxia: Secondary | ICD-10-CM | POA: Diagnosis not present

## 2024-07-12 DIAGNOSIS — A0811 Acute gastroenteropathy due to Norwalk agent: Secondary | ICD-10-CM | POA: Diagnosis present

## 2024-07-12 DIAGNOSIS — E876 Hypokalemia: Secondary | ICD-10-CM | POA: Diagnosis present

## 2024-07-12 DIAGNOSIS — A4159 Other Gram-negative sepsis: Principal | ICD-10-CM | POA: Diagnosis present

## 2024-07-12 DIAGNOSIS — Z7901 Long term (current) use of anticoagulants: Secondary | ICD-10-CM

## 2024-07-12 DIAGNOSIS — R509 Fever, unspecified: Secondary | ICD-10-CM | POA: Diagnosis not present

## 2024-07-12 DIAGNOSIS — K7030 Alcoholic cirrhosis of liver without ascites: Secondary | ICD-10-CM | POA: Diagnosis present

## 2024-07-12 DIAGNOSIS — I110 Hypertensive heart disease with heart failure: Secondary | ICD-10-CM | POA: Diagnosis present

## 2024-07-12 DIAGNOSIS — E871 Hypo-osmolality and hyponatremia: Secondary | ICD-10-CM | POA: Diagnosis not present

## 2024-07-12 DIAGNOSIS — R0600 Dyspnea, unspecified: Secondary | ICD-10-CM | POA: Diagnosis not present

## 2024-07-12 DIAGNOSIS — R6520 Severe sepsis without septic shock: Secondary | ICD-10-CM

## 2024-07-12 DIAGNOSIS — G2581 Restless legs syndrome: Secondary | ICD-10-CM | POA: Diagnosis not present

## 2024-07-12 DIAGNOSIS — R197 Diarrhea, unspecified: Secondary | ICD-10-CM | POA: Diagnosis not present

## 2024-07-12 DIAGNOSIS — K529 Noninfective gastroenteritis and colitis, unspecified: Secondary | ICD-10-CM | POA: Diagnosis not present

## 2024-07-12 DIAGNOSIS — G9341 Metabolic encephalopathy: Secondary | ICD-10-CM | POA: Diagnosis not present

## 2024-07-12 DIAGNOSIS — Z9889 Other specified postprocedural states: Secondary | ICD-10-CM

## 2024-07-12 DIAGNOSIS — Z85828 Personal history of other malignant neoplasm of skin: Secondary | ICD-10-CM

## 2024-07-12 DIAGNOSIS — I5033 Acute on chronic diastolic (congestive) heart failure: Secondary | ICD-10-CM | POA: Diagnosis present

## 2024-07-12 DIAGNOSIS — Z1152 Encounter for screening for COVID-19: Secondary | ICD-10-CM

## 2024-07-12 DIAGNOSIS — D649 Anemia, unspecified: Secondary | ICD-10-CM | POA: Diagnosis not present

## 2024-07-12 DIAGNOSIS — Z800 Family history of malignant neoplasm of digestive organs: Secondary | ICD-10-CM

## 2024-07-12 DIAGNOSIS — Z5320 Procedure and treatment not carried out because of patient's decision for unspecified reasons: Secondary | ICD-10-CM | POA: Diagnosis not present

## 2024-07-12 DIAGNOSIS — L0390 Cellulitis, unspecified: Secondary | ICD-10-CM | POA: Diagnosis present

## 2024-07-12 DIAGNOSIS — Z96641 Presence of right artificial hip joint: Secondary | ICD-10-CM | POA: Diagnosis present

## 2024-07-12 DIAGNOSIS — F1011 Alcohol abuse, in remission: Secondary | ICD-10-CM | POA: Diagnosis present

## 2024-07-12 DIAGNOSIS — Z9089 Acquired absence of other organs: Secondary | ICD-10-CM

## 2024-07-12 DIAGNOSIS — L03115 Cellulitis of right lower limb: Secondary | ICD-10-CM | POA: Diagnosis present

## 2024-07-12 DIAGNOSIS — Z79899 Other long term (current) drug therapy: Secondary | ICD-10-CM

## 2024-07-12 DIAGNOSIS — R410 Disorientation, unspecified: Secondary | ICD-10-CM | POA: Diagnosis not present

## 2024-07-12 DIAGNOSIS — I482 Chronic atrial fibrillation, unspecified: Secondary | ICD-10-CM | POA: Diagnosis not present

## 2024-07-12 DIAGNOSIS — Z87891 Personal history of nicotine dependence: Secondary | ICD-10-CM

## 2024-07-12 DIAGNOSIS — A419 Sepsis, unspecified organism: Secondary | ICD-10-CM | POA: Diagnosis not present

## 2024-07-12 DIAGNOSIS — I4821 Permanent atrial fibrillation: Secondary | ICD-10-CM | POA: Diagnosis present

## 2024-07-12 DIAGNOSIS — R7401 Elevation of levels of liver transaminase levels: Secondary | ICD-10-CM | POA: Diagnosis present

## 2024-07-12 DIAGNOSIS — Z043 Encounter for examination and observation following other accident: Secondary | ICD-10-CM | POA: Diagnosis not present

## 2024-07-12 LAB — CBC WITH DIFFERENTIAL/PLATELET
Abs Immature Granulocytes: 0.09 K/uL — ABNORMAL HIGH (ref 0.00–0.07)
Basophils Absolute: 0 K/uL (ref 0.0–0.1)
Basophils Relative: 0 %
Eosinophils Absolute: 0 K/uL (ref 0.0–0.5)
Eosinophils Relative: 0 %
HCT: 33.4 % — ABNORMAL LOW (ref 39.0–52.0)
Hemoglobin: 10.7 g/dL — ABNORMAL LOW (ref 13.0–17.0)
Immature Granulocytes: 1 %
Lymphocytes Relative: 3 %
Lymphs Abs: 0.3 K/uL — ABNORMAL LOW (ref 0.7–4.0)
MCH: 32.3 pg (ref 26.0–34.0)
MCHC: 32 g/dL (ref 30.0–36.0)
MCV: 100.9 fL — ABNORMAL HIGH (ref 80.0–100.0)
Monocytes Absolute: 0.4 K/uL (ref 0.1–1.0)
Monocytes Relative: 4 %
Neutro Abs: 7.9 K/uL — ABNORMAL HIGH (ref 1.7–7.7)
Neutrophils Relative %: 92 %
Platelets: 192 K/uL (ref 150–400)
RBC: 3.31 MIL/uL — ABNORMAL LOW (ref 4.22–5.81)
RDW: 20.5 % — ABNORMAL HIGH (ref 11.5–15.5)
WBC: 8.7 K/uL (ref 4.0–10.5)
nRBC: 0.2 % (ref 0.0–0.2)

## 2024-07-12 LAB — RESP PANEL BY RT-PCR (RSV, FLU A&B, COVID)  RVPGX2
Influenza A by PCR: NEGATIVE
Influenza B by PCR: NEGATIVE
Resp Syncytial Virus by PCR: NEGATIVE
SARS Coronavirus 2 by RT PCR: NEGATIVE

## 2024-07-12 LAB — URINALYSIS, W/ REFLEX TO CULTURE (INFECTION SUSPECTED)
Bilirubin Urine: NEGATIVE
Glucose, UA: NEGATIVE mg/dL
Ketones, ur: 5 mg/dL — AB
Nitrite: NEGATIVE
Protein, ur: NEGATIVE mg/dL
Specific Gravity, Urine: 1.016 (ref 1.005–1.030)
pH: 6 (ref 5.0–8.0)

## 2024-07-12 LAB — COMPREHENSIVE METABOLIC PANEL WITH GFR
ALT: 18 U/L (ref 0–44)
AST: 37 U/L (ref 15–41)
Albumin: 3.3 g/dL — ABNORMAL LOW (ref 3.5–5.0)
Alkaline Phosphatase: 127 U/L — ABNORMAL HIGH (ref 38–126)
Anion gap: 12 (ref 5–15)
BUN: <5 mg/dL — ABNORMAL LOW (ref 8–23)
CO2: 19 mmol/L — ABNORMAL LOW (ref 22–32)
Calcium: 8.4 mg/dL — ABNORMAL LOW (ref 8.9–10.3)
Chloride: 96 mmol/L — ABNORMAL LOW (ref 98–111)
Creatinine, Ser: 0.61 mg/dL (ref 0.61–1.24)
GFR, Estimated: >60 mL/min (ref >60)
Glucose, Bld: 118 mg/dL — ABNORMAL HIGH (ref 70–99)
Potassium: 3.3 mmol/L — ABNORMAL LOW (ref 3.5–5.1)
Sodium: 127 mmol/L — ABNORMAL LOW (ref 135–145)
Total Bilirubin: 1.8 mg/dL — ABNORMAL HIGH (ref 0.0–1.2)
Total Protein: 7 g/dL (ref 6.5–8.1)

## 2024-07-12 LAB — I-STAT VENOUS BLOOD GAS, ED
Acid-base deficit: 1 mmol/L (ref 0.0–2.0)
Bicarbonate: 21.6 mmol/L (ref 20.0–28.0)
Calcium, Ion: 1.01 mmol/L — ABNORMAL LOW (ref 1.15–1.40)
HCT: 35 % — ABNORMAL LOW (ref 39.0–52.0)
Hemoglobin: 11.9 g/dL — ABNORMAL LOW (ref 13.0–17.0)
O2 Saturation: 82 %
Potassium: 3.2 mmol/L — ABNORMAL LOW (ref 3.5–5.1)
Sodium: 129 mmol/L — ABNORMAL LOW (ref 135–145)
TCO2: 22 mmol/L (ref 22–32)
pCO2, Ven: 28.2 mmHg — ABNORMAL LOW (ref 44–60)
pH, Ven: 7.492 — ABNORMAL HIGH (ref 7.25–7.43)
pO2, Ven: 41 mmHg (ref 32–45)

## 2024-07-12 LAB — MAGNESIUM: Magnesium: 1.8 mg/dL (ref 1.7–2.4)

## 2024-07-12 LAB — PROTIME-INR
INR: 2.6 — ABNORMAL HIGH (ref 0.8–1.2)
Prothrombin Time: 29.3 s — ABNORMAL HIGH (ref 11.4–15.2)

## 2024-07-12 LAB — CBG MONITORING, ED: Glucose-Capillary: 119 mg/dL — ABNORMAL HIGH (ref 70–99)

## 2024-07-12 LAB — I-STAT CG4 LACTIC ACID, ED
Lactic Acid, Venous: 2.6 mmol/L (ref 0.5–1.9)
Lactic Acid, Venous: 1.1 mmol/L (ref 0.5–1.9)

## 2024-07-12 LAB — BRAIN NATRIURETIC PEPTIDE: B Natriuretic Peptide: 365 pg/mL — ABNORMAL HIGH (ref 0.0–100.0)

## 2024-07-12 MED ORDER — ADULT MULTIVITAMIN W/MINERALS CH
1.0000 | ORAL_TABLET | Freq: Every day | ORAL | Status: DC
  Administered 2024-07-13 – 2024-07-14 (×2): 1 via ORAL
  Filled 2024-07-12 (×2): qty 1

## 2024-07-12 MED ORDER — ACETAMINOPHEN 325 MG PO TABS
650.0000 mg | ORAL_TABLET | Freq: Four times a day (QID) | ORAL | Status: DC | PRN
  Administered 2024-07-13 – 2024-07-15 (×3): 650 mg via ORAL
  Filled 2024-07-12 (×3): qty 2

## 2024-07-12 MED ORDER — LORAZEPAM 1 MG PO TABS: 1.0000 mg | ORAL_TABLET | ORAL | Status: DC | PRN

## 2024-07-12 MED ORDER — LORAZEPAM 2 MG/ML IJ SOLN: 1.0000 mg | INTRAMUSCULAR | Status: DC | PRN

## 2024-07-12 MED ORDER — CLOTRIMAZOLE 1 % EX CREA
TOPICAL_CREAM | Freq: Two times a day (BID) | CUTANEOUS | Status: DC
  Filled 2024-07-12 (×3): qty 15

## 2024-07-12 MED ORDER — PANTOPRAZOLE SODIUM 40 MG PO TBEC
40.0000 mg | DELAYED_RELEASE_TABLET | Freq: Every day | ORAL | Status: DC
  Administered 2024-07-13 – 2024-07-16 (×4): 40 mg via ORAL
  Filled 2024-07-12 (×4): qty 1

## 2024-07-12 MED ORDER — PROCHLORPERAZINE EDISYLATE 10 MG/2ML IJ SOLN: 5.0000 mg | Freq: Four times a day (QID) | INTRAMUSCULAR | Status: DC | PRN

## 2024-07-12 MED ORDER — METRONIDAZOLE 500 MG/100ML IV SOLN
500.0000 mg | Freq: Once | INTRAVENOUS | Status: AC
  Administered 2024-07-12: 500 mg via INTRAVENOUS
  Filled 2024-07-12: qty 100

## 2024-07-12 MED ORDER — LACTATED RINGERS IV BOLUS
1000.0000 mL | Freq: Once | INTRAVENOUS | Status: AC
  Administered 2024-07-12: 1000 mL via INTRAVENOUS

## 2024-07-12 MED ORDER — LORAZEPAM 2 MG/ML IJ SOLN: 0.0000 mg | Freq: Four times a day (QID) | INTRAMUSCULAR | Status: DC

## 2024-07-12 MED ORDER — SODIUM CHLORIDE 0.9 % IV SOLN
2.0000 g | Freq: Once | INTRAVENOUS | Status: AC
  Administered 2024-07-12: 2 g via INTRAVENOUS
  Filled 2024-07-12: qty 12.5

## 2024-07-12 MED ORDER — IOHEXOL 350 MG/ML SOLN
75.0000 mL | Freq: Once | INTRAVENOUS | Status: AC | PRN
  Administered 2024-07-12: 75 mL via INTRAVENOUS

## 2024-07-12 MED ORDER — THIAMINE MONONITRATE 100 MG PO TABS
100.0000 mg | ORAL_TABLET | Freq: Every day | ORAL | Status: DC
  Administered 2024-07-13 – 2024-07-14 (×2): 100 mg via ORAL
  Filled 2024-07-12 (×2): qty 1

## 2024-07-12 MED ORDER — APIXABAN 5 MG PO TABS
5.0000 mg | ORAL_TABLET | Freq: Two times a day (BID) | ORAL | Status: DC
  Administered 2024-07-13 – 2024-07-16 (×8): 5 mg via ORAL
  Filled 2024-07-12 (×8): qty 1

## 2024-07-12 MED ORDER — FUROSEMIDE 10 MG/ML IJ SOLN
20.0000 mg | Freq: Two times a day (BID) | INTRAMUSCULAR | Status: DC
  Administered 2024-07-13 – 2024-07-14 (×4): 20 mg via INTRAVENOUS
  Filled 2024-07-12 (×4): qty 2

## 2024-07-12 MED ORDER — DILTIAZEM HCL ER COATED BEADS 120 MG PO CP24
120.0000 mg | ORAL_CAPSULE | Freq: Every day | ORAL | Status: DC
Start: 2024-07-13 — End: 2024-07-16
  Administered 2024-07-13 – 2024-07-16 (×4): 120 mg via ORAL
  Filled 2024-07-12 (×4): qty 1

## 2024-07-12 MED ORDER — LORAZEPAM 2 MG/ML IJ SOLN: 0.0000 mg | Freq: Two times a day (BID) | INTRAMUSCULAR | Status: DC

## 2024-07-12 MED ORDER — VANCOMYCIN HCL IN DEXTROSE 1-5 GM/200ML-% IV SOLN
1000.0000 mg | Freq: Once | INTRAVENOUS | Status: AC
  Administered 2024-07-12: 1000 mg via INTRAVENOUS
  Filled 2024-07-12: qty 200

## 2024-07-12 MED ORDER — POTASSIUM CHLORIDE CRYS ER 20 MEQ PO TBCR
20.0000 meq | EXTENDED_RELEASE_TABLET | Freq: Once | ORAL | Status: AC
  Administered 2024-07-13: 20 meq via ORAL
  Filled 2024-07-12: qty 1

## 2024-07-12 MED ORDER — THIAMINE HCL 100 MG/ML IJ SOLN
100.0000 mg | Freq: Every day | INTRAMUSCULAR | Status: DC
  Filled 2024-07-12: qty 2

## 2024-07-12 MED ORDER — ACETAMINOPHEN 650 MG RE SUPP: 650.0000 mg | Freq: Four times a day (QID) | RECTAL | Status: DC | PRN

## 2024-07-12 MED ORDER — MAGNESIUM SULFATE IN D5W 1-5 GM/100ML-% IV SOLN
1.0000 g | Freq: Once | INTRAVENOUS | Status: AC
  Administered 2024-07-13: 1 g via INTRAVENOUS
  Filled 2024-07-12: qty 100

## 2024-07-12 MED ORDER — SODIUM CHLORIDE 0.9% FLUSH
3.0000 mL | Freq: Two times a day (BID) | INTRAVENOUS | Status: DC
  Administered 2024-07-13 – 2024-07-16 (×6): 3 mL via INTRAVENOUS

## 2024-07-12 MED ORDER — LACTATED RINGERS IV BOLUS (SEPSIS)
1000.0000 mL | Freq: Once | INTRAVENOUS | Status: AC
  Administered 2024-07-12: 1000 mL via INTRAVENOUS

## 2024-07-12 MED ORDER — LACTATED RINGERS IV BOLUS: 500.0000 mL | Freq: Once | INTRAVENOUS | Status: DC

## 2024-07-12 MED ORDER — METOPROLOL SUCCINATE ER 25 MG PO TB24
12.5000 mg | ORAL_TABLET | Freq: Every day | ORAL | Status: DC
  Administered 2024-07-13 – 2024-07-16 (×4): 12.5 mg via ORAL
  Filled 2024-07-12 (×4): qty 1

## 2024-07-12 MED ORDER — ACETAMINOPHEN 500 MG PO TABS
1000.0000 mg | ORAL_TABLET | Freq: Once | ORAL | Status: AC
  Administered 2024-07-12: 1000 mg via ORAL
  Filled 2024-07-12: qty 2

## 2024-07-12 MED ORDER — FOLIC ACID 1 MG PO TABS
1.0000 mg | ORAL_TABLET | Freq: Every day | ORAL | Status: DC
  Administered 2024-07-13 – 2024-07-14 (×2): 1 mg via ORAL
  Filled 2024-07-12 (×2): qty 1

## 2024-07-12 NOTE — ED Provider Notes (Signed)
  EMERGENCY DEPARTMENT AT [facility] Provider Note   CSN: [id number] Arrival date & time: 07/12/24  8191     Patient presents with: No chief complaint on file.   Jeffrey Randolph is a 68 y.o. male.   Patient with complex medical history including atrial fibrillation, stroke, cirrhosis, alcohol abuse, recent admission for right hip surgery 5 weeks ago, presents with EMS due to friend checking on him saying he looked worse.  EMS found blood pressure 130, temp 102, recent multiple episodes of diarrhea in the past couple days.  Unsure if on antibiotics currently.  Patient admits to a fall a few days ago said he hit his right hip.  Patient unsure exactly how long he has felt more weak.  Mild cough.  Unsure how long he has had a fever.  The history is provided by the patient, the EMS personnel and medical records.       Prior to Admission medications   Medication Sig Start Date End Date Taking? Authorizing Provider  acetaminophen  (TYLENOL ) 325 MG tablet Take 2 tablets (650 mg total) by mouth every 6 (six) hours as needed for mild pain (pain score 1-3), fever or headache. 05/24/24   Danton Lauraine LABOR, PA-C  apixaban  (ELIQUIS ) 5 MG TABS tablet Take 1 tablet (5 mg total) by mouth 2 (two) times daily. 05/25/24   Samtani, Jai-Gurmukh, MD  chlorhexidine  (HIBICLENS ) 4 % external liquid Apply 15 mLs (1 Application total) topically as directed for 30 doses. Use as directed daily for 5 days every other week for 6 weeks. 05/21/24   Danton Lauraine LABOR, PA-C  cholecalciferol  (CHOLECALCIFEROL ) 25 MCG tablet Take 1 tablet (1,000 Units total) by mouth daily. 05/24/24 08/22/24  Danton Lauraine LABOR, PA-C  cyanocobalamin  (VITAMIN B12) 1000 MCG tablet Take 1 tablet (1,000 mcg total) by mouth daily. Patient taking differently: Take 2,000 mcg by mouth daily. 04/16/24   Elgergawy, Brayton RAMAN, MD  diltiazem  (CARDIZEM  CD) 120 MG 24 hr capsule Take 1 capsule (120 mg total) by mouth daily. 06/15/24   Christobal Guadalajara, MD   folic acid  (FOLVITE ) 1 MG tablet Take 1 tablet (1 mg total) by mouth daily. 06/15/24   Christobal Guadalajara, MD  furosemide  (LASIX ) 20 MG tablet Take 1 tablet (20 mg total) by mouth daily. 06/14/24 07/14/24  Christobal Guadalajara, MD  lactulose  (CHRONULAC ) 10 GM/15ML solution Take 20 g by mouth 2 (two) times daily.    [provider]  methocarbamol  (ROBAXIN ) 500 MG tablet Take 500 mg by mouth daily as needed for muscle spasms.    [provider]  metoprolol  succinate (TOPROL -XL) 25 MG 24 hr tablet Take 0.5 tablets (12.5 mg total) by mouth daily. 05/26/24   Samtani, Jai-Gurmukh, MD  omeprazole (PRILOSEC) 40 MG capsule Take 40 mg by mouth daily.    [provider]  oxyCODONE  (OXY IR/ROXICODONE ) 5 MG immediate release tablet Take 0.5-1 tablets (2.5-5 mg total) by mouth every 4 (four) hours as needed for moderate pain (pain score 4-6) or severe pain (pain score 7-10) (2.5 mg pain score 4-6, 5 mg pain score 7-10). 05/24/24   Danton Lauraine LABOR, PA-C  pregabalin  (LYRICA ) 200 MG capsule Take 1 capsule (200 mg total) by mouth 3 (three) times daily. Patient taking differently: Take 200 mg by mouth daily as needed (pain). 05/28/23   Ghimire, Donalda HERO, MD  rOPINIRole  (REQUIP ) 0.5 MG tablet Take 1 mg by mouth at bedtime.    [provider]  thiamine  (VITAMIN B1) 100 MG tablet Take  1 tablet (100 mg total) by mouth daily. 06/15/24   Christobal Guadalajara, MD    Allergies: Patient has no known allergies.    Review of Systems  Constitutional:  Positive for fatigue and fever. Negative for chills.  HENT:  Negative for congestion.   Eyes:  Negative for visual disturbance.  Respiratory:  Positive for cough and shortness of breath.   Cardiovascular:  Negative for chest pain.  Gastrointestinal:  Negative for abdominal pain and vomiting.  Genitourinary:  Negative for dysuria and flank pain.  Musculoskeletal:  Negative for back pain, neck pain and neck stiffness.  Skin:  Positive for rash.  Neurological:  Negative for  light-headedness and headaches.    Updated Vital Signs BP 126/60   Pulse 90   Temp 99.1 F (37.3 C) (Axillary)   Resp 15   Ht 5' 8 (1.727 m)   Wt 83.5 kg   SpO2 98%   BMI 27.98 kg/m   Physical Exam Vitals and nursing note reviewed.  Constitutional:      General: He is in acute distress.     Appearance: He is well-developed. He is ill-appearing.  HENT:     Head: Normocephalic and atraumatic.     Mouth/Throat:     Mouth: Mucous membranes are dry.  Eyes:     General:        Right eye: No discharge.        Left eye: No discharge.     Conjunctiva/sclera: Conjunctivae normal.  Neck:     Trachea: No tracheal deviation.  Cardiovascular:     Rate and Rhythm: Regular rhythm. Tachycardia present.  Pulmonary:     Effort: Respiratory distress present.     Breath sounds: Rhonchi present.  Abdominal:     General: There is no distension.     Palpations: Abdomen is soft.     Tenderness: There is no abdominal tenderness. There is no guarding.  Musculoskeletal:     Cervical back: Normal range of motion and neck supple. No rigidity.  Skin:    General: Skin is warm.     Capillary Refill: Capillary refill takes less than 2 seconds.     Findings: Erythema and rash present.     Comments: Patient has edema bilateral lower extremities worse on the right mild erythema anteriorly.  Patient has warmth and erythema groin bilateral and sacrum area no ulcer appreciated.  Mild tender to palpation.  Patient has edematous scrotum erythematous.  No drainage from anterior surgical hip wound.  Neurological:     General: No focal deficit present.     Mental Status: He is alert.     Comments: Patient presents pupils equal bilateral.  Patient generally weak and needs help to sit up moves extremities equal bilateral.  Mild somnolent but will awaken to verbal discussion.  Psychiatric:     Comments: Patient few word sentences, fatigue     (all labs ordered are listed, but only abnormal results are  displayed) Labs Reviewed  COMPREHENSIVE METABOLIC PANEL WITH GFR - Abnormal; Notable for the following components:      Result Value   Sodium 127 (*)    Potassium 3.3 (*)    Chloride 96 (*)    CO2 19 (*)    Glucose, Bld 118 (*)    BUN <5 (*)    Calcium 8.4 (*)    Albumin  3.3 (*)    Alkaline Phosphatase 127 (*)    Total Bilirubin 1.8 (*)    All other components within  normal limits  CBC WITH DIFFERENTIAL/PLATELET - Abnormal; Notable for the following components:   RBC 3.31 (*)    Hemoglobin 10.7 (*)    HCT 33.4 (*)    MCV 100.9 (*)    RDW 20.5 (*)    Neutro Abs 7.9 (*)    Lymphs Abs 0.3 (*)    Abs Immature Granulocytes 0.09 (*)    All other components within normal limits  PROTIME-INR - Abnormal; Notable for the following components:   Prothrombin Time 29.3 (*)    INR 2.6 (*)    All other components within normal limits  URINALYSIS, W/ REFLEX TO CULTURE (INFECTION SUSPECTED) - Abnormal; Notable for the following components:   Color, Urine AMBER (*)    APPearance HAZY (*)    Hgb urine dipstick MODERATE (*)    Ketones, ur 5 (*)    Leukocytes,Ua SMALL (*)    Bacteria, UA RARE (*)    All other components within normal limits  BRAIN NATRIURETIC PEPTIDE - Abnormal; Notable for the following components:   B Natriuretic Peptide 365.0 (*)    All other components within normal limits  I-STAT CG4 LACTIC ACID, ED - Abnormal; Notable for the following components:   Lactic Acid, Venous 2.6 (*)    All other components within normal limits  CBG MONITORING, ED - Abnormal; Notable for the following components:   Glucose-Capillary 119 (*)    All other components within normal limits  I-STAT VENOUS BLOOD GAS, ED - Abnormal; Notable for the following components:   pH, Ven 7.492 (*)    pCO2, Ven 28.2 (*)    Sodium 129 (*)    Potassium 3.2 (*)    Calcium, Ion 1.01 (*)    HCT 35.0 (*)    Hemoglobin 11.9 (*)    All other components within normal limits  RESP PANEL BY RT-PCR (RSV, FLU  A&B, COVID)  RVPGX2  URINE CULTURE  CULTURE, BLOOD (ROUTINE X 2)  CULTURE, BLOOD (ROUTINE X 2)  MAGNESIUM   I-STAT CG4 LACTIC ACID, ED    EKG: EKG Interpretation Date/Time:  Monday July 12 2024 19:04:48 EDT Ventricular Rate:  102 PR Interval:    QRS Duration:  104 QT Interval:  351 QTC Calculation: 458 R Axis:   101  Text Interpretation: Atrial fibrillation Borderline low voltage, extremity leads Consider right ventricular hypertrophy Confirmed by Tonia Chew [phone]) on 07/12/2024 7:41:44 PM  Radiology: CT ABDOMEN PELVIS W CONTRAST Result Date: 07/12/2024 CLINICAL DATA:  Sepsis. Recent right hip surgery. Fever. Scrotal swelling and rash. Recent fall. EXAM: CT ANGIOGRAPHY CHEST CT ABDOMEN AND PELVIS WITH CONTRAST TECHNIQUE: Multidetector CT imaging of the chest was performed using the standard protocol during bolus administration of intravenous contrast. Multiplanar CT image reconstructions and MIPs were obtained to evaluate the vascular anatomy. Multidetector CT imaging of the abdomen and pelvis was performed using the standard protocol during bolus administration of intravenous contrast. RADIATION DOSE REDUCTION: This exam was performed according to the departmental dose-optimization program which includes automated exposure control, adjustment of the mA and/or kV according to patient size and/or use of iterative reconstruction technique. CONTRAST:  75mL OMNIPAQUE  IOHEXOL  350 MG/ML SOLN COMPARISON:  CT chest abdomen and pelvis 05/21/2024 FINDINGS: CTA CHEST FINDINGS Cardiovascular: Technically adequate study with good opacification of the central and segmental pulmonary arteries. Moderate motion artifact. No focal filling defects. No evidence of significant pulmonary embolus. Cardiac enlargement. No pericardial effusions. Normal caliber thoracic aorta. No aortic dissection. Great vessel origins are patent. Minimal calcification of the  aorta and prominent coronary artery calcification.  Mediastinum/Nodes: Esophagus is decompressed. No significant lymphadenopathy. Thyroid  gland is unremarkable. Lungs/Pleura: Large right and small left pleural effusions. Consolidation atelectasis in both lung bases, also greater on the right. Patchy airspace disease in the aerated portions of the lungs likely to represent edema although could indicate pneumonia. No pneumothorax. Musculoskeletal: Degenerative changes in the spine. No acute bony abnormalities. Review of the MIP images confirms the above findings. CT ABDOMEN and PELVIS FINDINGS Hepatobiliary: Cirrhotic changes in the liver with enlarged lateral segment left and caudate lobes and diffuse nodular contour. No focal lesions. Cholelithiasis with mild pericholecystic edema. This could indicate gallbladder inflammation but more likely related to liver disease. No bile duct dilatation. Pancreas: Unremarkable. No pancreatic ductal dilatation or surrounding inflammatory changes. Spleen: Diffuse enlargement of the spleen.  No focal lesions. Adrenals/Urinary Tract: Adrenal glands are unremarkable. Kidneys are normal, without renal calculi, focal lesion, or hydronephrosis. Bladder wall is mildly thickened, possibly indicating cystitis. Correlate with urinalysis. Stomach/Bowel: Stomach, small bowel, and colon are mostly decompressed. Despite underdistention, there is evidence of wall thickening in the rectosigmoid and descending colon suggesting probable colitis. This could represent infectious or inflammatory colitis. Less likely pseudomembranous colitis. This is new since the previous study. Vascular/Lymphatic: Aortic atherosclerosis. No enlarged abdominal or pelvic lymph nodes. Upper abdominal varices including umbilical vein varices. Reproductive: Prostate gland does not appear enlarged but is somewhat obscured visualization due to streak artifact from right hip arthroplasty component. Other: Small amount of free fluid in the abdomen and pelvis likely ascites.  Musculoskeletal: Postoperative right total hip arthroplasty. Degenerative changes in the spine. Mild anterior compression of T12 and L1 vertebrae without change since the prior study. Review of the MIP images confirms the above findings. IMPRESSION: 1. No evidence of significant pulmonary embolus. 2. Cardiac enlargement. 3. Bilateral pleural effusions, greater on the right, with bilateral basilar atelectasis or consolidation. 4. Patchy airspace disease in the lungs likely edema or possibly pneumonia. 5. Hepatic cirrhosis with splenic enlargement and upper abdominal varices. Small amount of abdominal and pelvic ascites. 6. Cholelithiasis. Gallbladder wall thickening is possibly related to gallbladder inflammation but more likely due to liver disease. 7. Diffuse wall thickening of the descending and rectosigmoid colon likely indicating colitis, possibly infectious or inflammatory etiologies. Pseudomembranous colitis also possible but less likely. 8. Aortic atherosclerosis. 9. Mildly thickened bladder wall, possibly cystitis. Correlate with urinalysis. Electronically Signed   By: Elsie Gravely M.D.   On: 07/12/2024 21:13   CT Angio Chest PE W and/or Wo Contrast Result Date: 07/12/2024 CLINICAL DATA:  Sepsis. Recent right hip surgery. Fever. Scrotal swelling and rash. Recent fall. EXAM: CT ANGIOGRAPHY CHEST CT ABDOMEN AND PELVIS WITH CONTRAST TECHNIQUE: Multidetector CT imaging of the chest was performed using the standard protocol during bolus administration of intravenous contrast. Multiplanar CT image reconstructions and MIPs were obtained to evaluate the vascular anatomy. Multidetector CT imaging of the abdomen and pelvis was performed using the standard protocol during bolus administration of intravenous contrast. RADIATION DOSE REDUCTION: This exam was performed according to the departmental dose-optimization program which includes automated exposure control, adjustment of the mA and/or kV according to  patient size and/or use of iterative reconstruction technique. CONTRAST:  75mL OMNIPAQUE  IOHEXOL  350 MG/ML SOLN COMPARISON:  CT chest abdomen and pelvis 05/21/2024 FINDINGS: CTA CHEST FINDINGS Cardiovascular: Technically adequate study with good opacification of the central and segmental pulmonary arteries. Moderate motion artifact. No focal filling defects. No evidence of significant pulmonary embolus. Cardiac enlargement. No pericardial  effusions. Normal caliber thoracic aorta. No aortic dissection. Great vessel origins are patent. Minimal calcification of the aorta and prominent coronary artery calcification. Mediastinum/Nodes: Esophagus is decompressed. No significant lymphadenopathy. Thyroid  gland is unremarkable. Lungs/Pleura: Large right and small left pleural effusions. Consolidation atelectasis in both lung bases, also greater on the right. Patchy airspace disease in the aerated portions of the lungs likely to represent edema although could indicate pneumonia. No pneumothorax. Musculoskeletal: Degenerative changes in the spine. No acute bony abnormalities. Review of the MIP images confirms the above findings. CT ABDOMEN and PELVIS FINDINGS Hepatobiliary: Cirrhotic changes in the liver with enlarged lateral segment left and caudate lobes and diffuse nodular contour. No focal lesions. Cholelithiasis with mild pericholecystic edema. This could indicate gallbladder inflammation but more likely related to liver disease. No bile duct dilatation. Pancreas: Unremarkable. No pancreatic ductal dilatation or surrounding inflammatory changes. Spleen: Diffuse enlargement of the spleen.  No focal lesions. Adrenals/Urinary Tract: Adrenal glands are unremarkable. Kidneys are normal, without renal calculi, focal lesion, or hydronephrosis. Bladder wall is mildly thickened, possibly indicating cystitis. Correlate with urinalysis. Stomach/Bowel: Stomach, small bowel, and colon are mostly decompressed. Despite underdistention,  there is evidence of wall thickening in the rectosigmoid and descending colon suggesting probable colitis. This could represent infectious or inflammatory colitis. Less likely pseudomembranous colitis. This is new since the previous study. Vascular/Lymphatic: Aortic atherosclerosis. No enlarged abdominal or pelvic lymph nodes. Upper abdominal varices including umbilical vein varices. Reproductive: Prostate gland does not appear enlarged but is somewhat obscured visualization due to streak artifact from right hip arthroplasty component. Other: Small amount of free fluid in the abdomen and pelvis likely ascites. Musculoskeletal: Postoperative right total hip arthroplasty. Degenerative changes in the spine. Mild anterior compression of T12 and L1 vertebrae without change since the prior study. Review of the MIP images confirms the above findings. IMPRESSION: 1. No evidence of significant pulmonary embolus. 2. Cardiac enlargement. 3. Bilateral pleural effusions, greater on the right, with bilateral basilar atelectasis or consolidation. 4. Patchy airspace disease in the lungs likely edema or possibly pneumonia. 5. Hepatic cirrhosis with splenic enlargement and upper abdominal varices. Small amount of abdominal and pelvic ascites. 6. Cholelithiasis. Gallbladder wall thickening is possibly related to gallbladder inflammation but more likely due to liver disease. 7. Diffuse wall thickening of the descending and rectosigmoid colon likely indicating colitis, possibly infectious or inflammatory etiologies. Pseudomembranous colitis also possible but less likely. 8. Aortic atherosclerosis. 9. Mildly thickened bladder wall, possibly cystitis. Correlate with urinalysis. Electronically Signed   By: Elsie Gravely M.D.   On: 07/12/2024 21:13   DG Pelvis Portable Result Date: 07/12/2024 CLINICAL DATA:  fall EXAM: PORTABLE PELVIS 1-2 VIEWS COMPARISON:  X-ray pelvis 05/21/2024. FINDINGS: Total right hip arthroplasty partially  visualized. No radiographic findings suggest surgical hardware complication. There is no evidence of pelvic fracture or diastasis. No pelvic bone lesions are seen. Vascular calcifications. Gaseous distension of a loop of bowel within the right mid abdomen. IMPRESSION: Negative for acute traumatic injury. Electronically Signed   By: Morgane  Naveau M.D.   On: 07/12/2024 19:53   DG Chest Port 1 View if patient is in a treatment room. Result Date: 07/12/2024 CLINICAL DATA:  Suspected Sepsis fall EXAM: PORTABLE CHEST 1 VIEW COMPARISON:  Chest x-ray 05/21/2024 FINDINGS: The heart and mediastinal contours are unchanged. No focal consolidation. No pulmonary edema. Interval development of at least small to moderate right and trace to small left pleural effusions. No pneumothorax. No acute osseous abnormality. IMPRESSION: Interval development of at  least small to moderate right and trace to small left pleural effusions. Question loculation on the right. Recommend CT chest with intravenous contrast (not CT PA) for further evaluation. Electronically Signed   By: Morgane  Naveau M.D.   On: 07/12/2024 19:52     .Critical Care  Performed by: Tonia Chew, MD Authorized by: Tonia Chew, MD   Critical care provider statement:    Critical care time (minutes):  80   Critical care start time:  07/12/2024 6:45 PM   Critical care end time:  07/12/2024 8:05 PM   Critical care time was exclusive of:  Separately billable procedures and treating other patients and teaching time   Critical care was necessary to treat or prevent imminent or life-threatening deterioration of the following conditions:  Sepsis   Critical care was time spent personally by me on the following activities:  Ordering and performing treatments and interventions, ordering and review of laboratory studies, ordering and review of radiographic studies, pulse oximetry, re-evaluation of patient's condition and review of old charts    Medications  Ordered in the ED  lactated ringers  bolus 500 mL (500 mLs Intravenous Not Given 07/12/24 2253)  ceFEPIme  (MAXIPIME ) 2 g in sodium chloride  0.9 % 100 mL IVPB (0 g Intravenous Stopped 07/12/24 2020)  metroNIDAZOLE (FLAGYL) IVPB 500 mg (0 mg Intravenous Stopped 07/12/24 2137)  vancomycin  (VANCOCIN ) IVPB 1000 mg/200 mL premix (1,000 mg Intravenous New Bag/Given 07/12/24 2139)  lactated ringers  bolus 1,000 mL (0 mLs Intravenous Stopped 07/12/24 2100)  acetaminophen  (TYLENOL ) tablet 1,000 mg (1,000 mg Oral Given 07/12/24 1946)  iohexol  (OMNIPAQUE ) 350 MG/ML injection 75 mL (75 mLs Intravenous Contrast Given 07/12/24 2055)  lactated ringers  bolus 1,000 mL (1,000 mLs Intravenous New Bag/Given 07/12/24 2103)                                    Medical Decision Making Amount and/or Complexity of Data Reviewed Labs: ordered. Radiology: ordered.  Risk OTC drugs. Prescription drug management. Decision regarding hospitalization.   Code sepsis.  Patient with recent admission and hip surgery and recent fall since being discharged presents with clinical concern for sepsis.  Source of sepsis includes cellulitis given skin findings on groin exam, pneumonia given shortness of breath/mild cough, enteritis given recent diarrhea, bacteremia with recent surgery, urine source, postop wound source, other.  Discussed at the bedside with nursing plan for IV fluids, IV antibiotics, blood work, plan for x-rays and CT scan for further delineation.  Patient's full code.    Patient is oxygen stable on 2 L nasal cannula.  Temperature improved as well as heart rate from 1 20-90.  Patient somnolent on reassessment to loud verbal will open eyes and answer few brief questions.  Patient received broad-spectrum IV antibiotics with multiple possible sources blood culture sent.  Electrolytes reviewed hyponatremia 127 acute on chronic, normal white blood cell count, mild chronic anemia.  Urinalysis independent reviewed moderate  hemoglobin small leukocytes urine culture sent.  Chest x-ray independently reviewed concerning for pneumonia, CT scans results independently reviewed clarified multiple possible sources of sepsis including colitis, pneumonia.   Discussed with hospitalist for admission, patient's blood pressure stable in the ED.  Repeat lactate 2.6.     Final diagnoses:  Acute dyspnea  Sepsis due to pneumonia (HCC)  Tinea cruris  Gallstones  Other cirrhosis of liver (HCC)  Colitis    ED Discharge Orders     None  Tonia Chew, MD 07/12/24 2258

## 2024-07-12 NOTE — Sepsis Progress Note (Signed)
 Following for sepsis monitoring ?

## 2024-07-12 NOTE — ED Triage Notes (Signed)
 Pt was BIB [facility] from home where he normally has someone check on him daily. His friend told EMS he has recently been falling and had a hip replacement about 5 weeks ago. The friend went to check on him today and said that he looked worse than he has in a while so decided to call EMS. BP was 90s systolic tapping oout at about 130. Temp 102 94% RA Cap 19 LR enroute. Multiple episodes of diarhhea the past  couple days.

## 2024-07-12 NOTE — ED Notes (Signed)
 Zavitz MD notified of patient condition. MD at bedside seeing patient.

## 2024-07-12 NOTE — ED Notes (Signed)
 Notified EDP to come to bedside. Patient is very sick.

## 2024-07-12 NOTE — ED Notes (Signed)
 Patient altered and lethargic. Pt arrives to ED covered in feces. Pt urinary occurrence during triage process. Pt changed into new brief and put in gown.

## 2024-07-12 NOTE — H&P (Addendum)
 History and Physical    Yuma Pacella FMW:[id number] DOB: [birth year] DOA: 07/12/2024  PCP: Montey Lot, PA-C   Patient coming from: Home   Chief Complaint: Confusion, lethargy, diarrhea   HPI: Jeffrey Randolph is a 68 y.o. male with medical history significant for alcoholism, cirrhosis with ascites, atrial fibrillation on Eliquis , chronic HFpEF, history of CVA, chronic hyponatremia, and admission last month for lower extremity cellulitis complicated by acute CHF who now presents with confusion, lethargy, and diarrhea.  Patient's friend went to check on him today, thought that he looked quite ill, and called EMS.  Patient was found to be febrile and tachycardic.  Patient reports multiple episodes of diarrhea in the past couple days, generalized weakness and fatigue, shortness of breath, and mild cough.  His wife was updated by phone and notes that he frequently has episodes of confusion.   ED Course: Upon arrival to the ED, patient is found to be febrile to 40 C and saturating mid 90s on 2 L/min supplemental oxygen with tachypnea, tachycardia, and stable blood pressure.  Labs are most notable for sodium 127, potassium 3.3, normal WBC, lactate 2.6, INR 2.6, BNP 365, and negative respiratory virus panel.  CTA chest is negative for PE but notable for cardiomegaly, large right and small left pleural effusions, and patchy airspace disease that likely reflects edema.  CT also reveals cirrhosis with splenomegaly, varices, and small ascites, as well as cholelithiasis with nonspecific gallbladder wall thickening and infectious or inflammatory colitis.  Blood and urine cultures were collected in the ED and the patient was given 2 L LR, acetaminophen , vancomycin , cefepime , and Flagyl.  Review of Systems:  All other systems reviewed and apart from HPI, are negative.  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    Past Surgical History:  Procedure Laterality Date   ESOPHAGOGASTRODUODENOSCOPY (EGD) WITH PROPOFOL  N/A 01/06/2023   Procedure: ESOPHAGOGASTRODUODENOSCOPY (EGD) WITH PROPOFOL ;  Surgeon: [name], MD;  Location: [facility] ENDOSCOPY;  Service: Gastroenterology;  Laterality: N/A;  Wants close to 9 AM arrival   FRACTURE SURGERY     LUMBAR LAMINECTOMY/DECOMPRESSION MICRODISCECTOMY Right 01/09/2015   Procedure: LUMBAR LAMINECTOMY/DECOMPRESSION MICRODISCECTOMY 1 LEVEL;  Surgeon: [name], MD;  Location: [facility] NEURO ORS;  Service: Neurosurgery;  Laterality: Right;  LUMBAR LAMINECTOMY/DECOMPRESSION MICRODISCECTOMY 1 LEVEL RIGHT LUMBAR 2-3   TONSILLECTOMY     TOTAL HIP ARTHROPLASTY Right 05/21/2024   Procedure: ARTHROPLASTY, HIP, TOTAL, ANTERIOR APPROACH;  Surgeon: Kendal Franky SQUIBB, MD;  Location: [facility] OR;  Service: Orthopedics;  Laterality: Right;    Social History:   reports that he has quit smoking. His smoking use included cigarettes. He has never used smokeless tobacco. He reports current alcohol use. He reports that he does not use drugs.  No Known Allergies  Family History  Problem Relation Age of Onset   Pancreatic cancer Mother  Prior to Admission medications   Medication Sig Start Date End Date Taking? Authorizing Provider  acetaminophen  (TYLENOL ) 325 MG tablet Take 2 tablets (650 mg total) by mouth every 6 (six) hours as needed for mild pain (pain score 1-3), fever or headache. 05/24/24   Danton Lauraine LABOR, PA-C  apixaban  (ELIQUIS ) 5 MG  TABS tablet Take 1 tablet (5 mg total) by mouth 2 (two) times daily. 05/25/24   Samtani, Jai-Gurmukh, MD  chlorhexidine  (HIBICLENS ) 4 % external liquid Apply 15 mLs (1 Application total) topically as directed for 30 doses. Use as directed daily for 5 days every other week for 6 weeks. 05/21/24   Danton Lauraine LABOR, PA-C  cholecalciferol  (CHOLECALCIFEROL ) 25 MCG tablet Take 1 tablet (1,000 Units total) by mouth daily. 05/24/24 08/22/24  Danton Lauraine LABOR, PA-C  cyanocobalamin  (VITAMIN B12) 1000 MCG tablet Take 1 tablet (1,000 mcg total) by mouth daily. Patient taking differently: Take 2,000 mcg by mouth daily. 04/16/24   Elgergawy, Brayton RAMAN, MD  diltiazem  (CARDIZEM  CD) 120 MG 24 hr capsule Take 1 capsule (120 mg total) by mouth daily. 06/15/24   Christobal Guadalajara, MD  folic acid  (FOLVITE ) 1 MG tablet Take 1 tablet (1 mg total) by mouth daily. 06/15/24   Christobal Guadalajara, MD  furosemide  (LASIX ) 20 MG tablet Take 1 tablet (20 mg total) by mouth daily. 06/14/24 07/14/24  Christobal Guadalajara, MD  lactulose  (CHRONULAC ) 10 GM/15ML solution Take 20 g by mouth 2 (two) times daily.    [provider]  methocarbamol  (ROBAXIN ) 500 MG tablet Take 500 mg by mouth daily as needed for muscle spasms.    [provider]  metoprolol  succinate (TOPROL -XL) 25 MG 24 hr tablet Take 0.5 tablets (12.5 mg total) by mouth daily. 05/26/24   Samtani, Jai-Gurmukh, MD  omeprazole (PRILOSEC) 40 MG capsule Take 40 mg by mouth daily.    [provider]  oxyCODONE  (OXY IR/ROXICODONE ) 5 MG immediate release tablet Take 0.5-1 tablets (2.5-5 mg total) by mouth every 4 (four) hours as needed for moderate pain (pain score 4-6) or severe pain (pain score 7-10) (2.5 mg pain score 4-6, 5 mg pain score 7-10). 05/24/24   Danton Lauraine LABOR, PA-C  pregabalin  (LYRICA ) 200 MG capsule Take 1 capsule (200 mg total) by mouth 3 (three) times daily. Patient taking differently: Take 200 mg by mouth daily as needed (pain). 05/28/23   Ghimire, Donalda HERO, MD  rOPINIRole   (REQUIP ) 0.5 MG tablet Take 1 mg by mouth at bedtime.    [provider]  thiamine  (VITAMIN B1) 100 MG tablet Take 1 tablet (100 mg total) by mouth daily. 06/15/24   Christobal Guadalajara, MD    Physical Exam: Vitals:   07/12/24 1945 07/12/24 2130 07/12/24 2200 07/12/24 2219  BP: 139/67 (!) 122/51 126/60   Pulse: (!) 108 98 90   Resp: (!) 23 15 15    Temp:    99.1 F (37.3 C)  TempSrc:    Axillary  SpO2: 100% 98% 98%   Weight:      Height:        Constitutional: NAD, no pallor or diaphoresis   Eyes: PERTLA, lids and conjunctivae normal ENMT: Mucous membranes are moist. Posterior pharynx clear of any exudate or lesions.   Neck: supple, no masses  Respiratory: Labored respirations. Fine rales bilaterally.   Cardiovascular: Rate ~100 and irregularly irregular. Pretibial pitting edema.  Abdomen: No tenderness, soft. Bowel sounds active.  Musculoskeletal: no clubbing / cyanosis. No joint deformity upper and lower extremities.  Skin: Erythema, heat, and induration involving lower RLE and right groin; crusted lesions on nose and central chest. Skin otherwise warm, dry, well-perfused. Neurologic: CN 2-12 grossly intact. Moving all extremities. Somnolent, wakes to voice.     Labs and Imaging on Admission: I have personally reviewed following labs and imaging studies  CBC: Recent Labs  Lab 07/12/24 1915 07/12/24 1923  WBC 8.7  --   NEUTROABS 7.9*  --   HGB 10.7* 11.9*  HCT 33.4* 35.0*  MCV 100.9*  --   PLT 192  --    Basic Metabolic Panel: Recent Labs  Lab 07/12/24 1915 07/12/24 1923  NA 127* 129*  K 3.3* 3.2*  CL 96*  --   CO2 19*  --   GLUCOSE 118*  --   BUN <5*  --   CREATININE 0.61  --   CALCIUM 8.4*  --   MG 1.8  --    GFR: Estimated Creatinine Clearance: 93 mL/min (by C-G formula based on SCr of 0.61 mg/dL). Liver Function Tests: Recent Labs  Lab 07/12/24 1915  AST 37  ALT 18  ALKPHOS 127*  BILITOT 1.8*  PROT 7.0  ALBUMIN  3.3*   No results for input(s):  LIPASE, AMYLASE in the last 168 hours. No results for input(s): AMMONIA in the last 168 hours. Coagulation Profile: Recent Labs  Lab 07/12/24 1915  INR 2.6*   Cardiac Enzymes: No results for input(s): CKTOTAL, CKMB, CKMBINDEX, TROPONINI in the last 168 hours. BNP (last 3 results) No results for input(s): PROBNP in the last 8760 hours. HbA1C: No results for input(s): HGBA1C in the last 72 hours. CBG: Recent Labs  Lab 07/12/24 1843  GLUCAP 119*   Lipid Profile: No results for input(s): CHOL, HDL, LDLCALC, TRIG, CHOLHDL, LDLDIRECT in the last 72 hours. Thyroid  Function Tests: No results for input(s): TSH, T4TOTAL, FREET4, T3FREE, THYROIDAB in the last 72 hours. Anemia Panel: No results for input(s): VITAMINB12, FOLATE, FERRITIN, TIBC, IRON, RETICCTPCT in the last 72 hours. Urine analysis:    Component Value Date/Time   COLORURINE AMBER (A) 07/12/2024 1949   APPEARANCEUR HAZY (A) 07/12/2024 1949   LABSPEC 1.016 07/12/2024 1949   PHURINE 6.0 07/12/2024 1949   GLUCOSEU NEGATIVE 07/12/2024 1949   HGBUR MODERATE (A) 07/12/2024 1949   BILIRUBINUR NEGATIVE 07/12/2024 1949   KETONESUR 5 (A) 07/12/2024 1949   PROTEINUR NEGATIVE 07/12/2024 1949   UROBILINOGEN 0.2 08/03/2011 1937   NITRITE NEGATIVE 07/12/2024 1949   LEUKOCYTESUR SMALL (A) 07/12/2024 1949   Sepsis Labs: @LABRCNTIP (procalcitonin:4,lacticidven:4) ) Recent Results (from the past 240 hours)  Resp panel by RT-PCR (RSV, Flu A&B, Covid) Anterior Nasal Swab     Status: None   Collection Time: 07/12/24  7:49 PM   Specimen: Anterior Nasal Swab  Result Value Ref Range Status   SARS Coronavirus 2 by RT PCR NEGATIVE NEGATIVE Final   Influenza A by PCR NEGATIVE NEGATIVE Final   Influenza B by PCR NEGATIVE NEGATIVE Final    Comment: (NOTE) The Xpert Xpress SARS-CoV-2/FLU/RSV plus assay is intended as an aid in the diagnosis of influenza from Nasopharyngeal swab specimens  and should not be used as a sole basis for treatment. Nasal washings and aspirates are unacceptable for Xpert Xpress SARS-CoV-2/FLU/RSV testing.  Fact Sheet for Patients: BloggerCourse.com  Fact Sheet for Healthcare Providers: SeriousBroker.it  This test is not yet approved or cleared by the [place]  FDA and has been authorized for detection and/or diagnosis of SARS-CoV-2 by FDA under an Emergency Use Authorization (EUA).  This EUA will remain in effect (meaning this test can be used) for the duration of the COVID-19 declaration under Section 564(b)(1) of the Act, 21 U.S.C. section 360bbb-3(b)(1), unless the authorization is terminated or revoked.     Resp Syncytial Virus by PCR NEGATIVE NEGATIVE Final    Comment: (NOTE) Fact Sheet for Patients: BloggerCourse.com  Fact Sheet for Healthcare Providers: SeriousBroker.it  This test is not yet approved or cleared by the [place]  FDA and has been authorized for detection and/or diagnosis of SARS-CoV-2 by FDA under an Emergency Use Authorization (EUA). This EUA will remain in effect (meaning this test can be used) for the duration of the COVID-19 declaration under Section 564(b)(1) of the Act, 21 U.S.C. section 360bbb-3(b)(1), unless the authorization is terminated or revoked.  Performed at [facility], [address], [place], [place] [address]   Urine Culture     Status: None (Preliminary result)   Collection Time: 07/12/24  7:49 PM   Specimen: Urine, Clean Catch  Result Value Ref Range Status   Specimen Description URINE, CLEAN CATCH  Final   Special Requests   Final    NONE Reflexed from [id number] Performed at [facility], [address], [place], [place] [address]    Culture PENDING  Incomplete   Report Status PENDING  Incomplete     Radiological Exams on Admission: CT ABDOMEN PELVIS W  CONTRAST Result Date: 07/12/2024 CLINICAL DATA:  Sepsis. Recent right hip surgery. Fever. Scrotal swelling and rash. Recent fall. EXAM: CT ANGIOGRAPHY CHEST CT ABDOMEN AND PELVIS WITH CONTRAST TECHNIQUE: Multidetector CT imaging of the chest was performed using the standard protocol during bolus administration of intravenous contrast. Multiplanar CT image reconstructions and MIPs were obtained to evaluate the vascular anatomy. Multidetector CT imaging of the abdomen and pelvis was performed using the standard protocol during bolus administration of intravenous contrast. RADIATION DOSE REDUCTION: This exam was performed according to the departmental dose-optimization program which includes automated exposure control, adjustment of the mA and/or kV according to patient size and/or use of iterative reconstruction technique. CONTRAST:  75mL OMNIPAQUE  IOHEXOL  350 MG/ML SOLN COMPARISON:  CT chest abdomen and pelvis 05/21/2024 FINDINGS: CTA CHEST FINDINGS Cardiovascular: Technically adequate study with good opacification of the central and segmental pulmonary arteries. Moderate motion artifact. No focal filling defects. No evidence of significant pulmonary embolus. Cardiac enlargement. No pericardial effusions. Normal caliber thoracic aorta. No aortic dissection. Great vessel origins are patent. Minimal calcification of the aorta and prominent coronary artery calcification. Mediastinum/Nodes: Esophagus is decompressed. No significant lymphadenopathy. Thyroid  gland is unremarkable. Lungs/Pleura: Large right and small left pleural effusions. Consolidation atelectasis in both lung bases, also greater on the right. Patchy airspace disease in the aerated portions of the lungs likely to represent edema although could indicate pneumonia. No pneumothorax. Musculoskeletal: Degenerative changes in the spine. No acute bony abnormalities. Review of the MIP images confirms the above findings. CT ABDOMEN and PELVIS FINDINGS  Hepatobiliary: Cirrhotic changes in the liver with enlarged lateral segment left and caudate lobes and diffuse nodular contour. No focal lesions. Cholelithiasis with mild pericholecystic edema. This could indicate gallbladder inflammation but more likely related to liver disease. No bile duct dilatation. Pancreas: Unremarkable. No pancreatic ductal dilatation or surrounding inflammatory changes. Spleen: Diffuse enlargement of the spleen.  No focal lesions. Adrenals/Urinary Tract: Adrenal glands are unremarkable. Kidneys are normal, without renal calculi, focal lesion, or hydronephrosis. Bladder wall is mildly thickened, possibly indicating cystitis. Correlate with urinalysis. Stomach/Bowel: Stomach, small bowel, and  colon are mostly decompressed. Despite underdistention, there is evidence of wall thickening in the rectosigmoid and descending colon suggesting probable colitis. This could represent infectious or inflammatory colitis. Less likely pseudomembranous colitis. This is new since the previous study. Vascular/Lymphatic: Aortic atherosclerosis. No enlarged abdominal or pelvic lymph nodes. Upper abdominal varices including umbilical vein varices. Reproductive: Prostate gland does not appear enlarged but is somewhat obscured visualization due to streak artifact from right hip arthroplasty component. Other: Small amount of free fluid in the abdomen and pelvis likely ascites. Musculoskeletal: Postoperative right total hip arthroplasty. Degenerative changes in the spine. Mild anterior compression of T12 and L1 vertebrae without change since the prior study. Review of the MIP images confirms the above findings. IMPRESSION: 1. No evidence of significant pulmonary embolus. 2. Cardiac enlargement. 3. Bilateral pleural effusions, greater on the right, with bilateral basilar atelectasis or consolidation. 4. Patchy airspace disease in the lungs likely edema or possibly pneumonia. 5. Hepatic cirrhosis with splenic  enlargement and upper abdominal varices. Small amount of abdominal and pelvic ascites. 6. Cholelithiasis. Gallbladder wall thickening is possibly related to gallbladder inflammation but more likely due to liver disease. 7. Diffuse wall thickening of the descending and rectosigmoid colon likely indicating colitis, possibly infectious or inflammatory etiologies. Pseudomembranous colitis also possible but less likely. 8. Aortic atherosclerosis. 9. Mildly thickened bladder wall, possibly cystitis. Correlate with urinalysis. Electronically Signed   By: Elsie Gravely M.D.   On: 07/12/2024 21:13   CT Angio Chest PE W and/or Wo Contrast Result Date: 07/12/2024 CLINICAL DATA:  Sepsis. Recent right hip surgery. Fever. Scrotal swelling and rash. Recent fall. EXAM: CT ANGIOGRAPHY CHEST CT ABDOMEN AND PELVIS WITH CONTRAST TECHNIQUE: Multidetector CT imaging of the chest was performed using the standard protocol during bolus administration of intravenous contrast. Multiplanar CT image reconstructions and MIPs were obtained to evaluate the vascular anatomy. Multidetector CT imaging of the abdomen and pelvis was performed using the standard protocol during bolus administration of intravenous contrast. RADIATION DOSE REDUCTION: This exam was performed according to the departmental dose-optimization program which includes automated exposure control, adjustment of the mA and/or kV according to patient size and/or use of iterative reconstruction technique. CONTRAST:  75mL OMNIPAQUE  IOHEXOL  350 MG/ML SOLN COMPARISON:  CT chest abdomen and pelvis 05/21/2024 FINDINGS: CTA CHEST FINDINGS Cardiovascular: Technically adequate study with good opacification of the central and segmental pulmonary arteries. Moderate motion artifact. No focal filling defects. No evidence of significant pulmonary embolus. Cardiac enlargement. No pericardial effusions. Normal caliber thoracic aorta. No aortic dissection. Great vessel origins are patent.  Minimal calcification of the aorta and prominent coronary artery calcification. Mediastinum/Nodes: Esophagus is decompressed. No significant lymphadenopathy. Thyroid  gland is unremarkable. Lungs/Pleura: Large right and small left pleural effusions. Consolidation atelectasis in both lung bases, also greater on the right. Patchy airspace disease in the aerated portions of the lungs likely to represent edema although could indicate pneumonia. No pneumothorax. Musculoskeletal: Degenerative changes in the spine. No acute bony abnormalities. Review of the MIP images confirms the above findings. CT ABDOMEN and PELVIS FINDINGS Hepatobiliary: Cirrhotic changes in the liver with enlarged lateral segment left and caudate lobes and diffuse nodular contour. No focal lesions. Cholelithiasis with mild pericholecystic edema. This could indicate gallbladder inflammation but more likely related to liver disease. No bile duct dilatation. Pancreas: Unremarkable. No pancreatic ductal dilatation or surrounding inflammatory changes. Spleen: Diffuse enlargement of the spleen.  No focal lesions. Adrenals/Urinary Tract: Adrenal glands are unremarkable. Kidneys are normal, without renal calculi, focal lesion, or hydronephrosis.  Bladder wall is mildly thickened, possibly indicating cystitis. Correlate with urinalysis. Stomach/Bowel: Stomach, small bowel, and colon are mostly decompressed. Despite underdistention, there is evidence of wall thickening in the rectosigmoid and descending colon suggesting probable colitis. This could represent infectious or inflammatory colitis. Less likely pseudomembranous colitis. This is new since the previous study. Vascular/Lymphatic: Aortic atherosclerosis. No enlarged abdominal or pelvic lymph nodes. Upper abdominal varices including umbilical vein varices. Reproductive: Prostate gland does not appear enlarged but is somewhat obscured visualization due to streak artifact from right hip arthroplasty  component. Other: Small amount of free fluid in the abdomen and pelvis likely ascites. Musculoskeletal: Postoperative right total hip arthroplasty. Degenerative changes in the spine. Mild anterior compression of T12 and L1 vertebrae without change since the prior study. Review of the MIP images confirms the above findings. IMPRESSION: 1. No evidence of significant pulmonary embolus. 2. Cardiac enlargement. 3. Bilateral pleural effusions, greater on the right, with bilateral basilar atelectasis or consolidation. 4. Patchy airspace disease in the lungs likely edema or possibly pneumonia. 5. Hepatic cirrhosis with splenic enlargement and upper abdominal varices. Small amount of abdominal and pelvic ascites. 6. Cholelithiasis. Gallbladder wall thickening is possibly related to gallbladder inflammation but more likely due to liver disease. 7. Diffuse wall thickening of the descending and rectosigmoid colon likely indicating colitis, possibly infectious or inflammatory etiologies. Pseudomembranous colitis also possible but less likely. 8. Aortic atherosclerosis. 9. Mildly thickened bladder wall, possibly cystitis. Correlate with urinalysis. Electronically Signed   By: Elsie Gravely M.D.   On: 07/12/2024 21:13   DG Pelvis Portable Result Date: 07/12/2024 CLINICAL DATA:  fall EXAM: PORTABLE PELVIS 1-2 VIEWS COMPARISON:  X-ray pelvis 05/21/2024. FINDINGS: Total right hip arthroplasty partially visualized. No radiographic findings suggest surgical hardware complication. There is no evidence of pelvic fracture or diastasis. No pelvic bone lesions are seen. Vascular calcifications. Gaseous distension of a loop of bowel within the right mid abdomen. IMPRESSION: Negative for acute traumatic injury. Electronically Signed   By: Morgane  Naveau M.D.   On: 07/12/2024 19:53   DG Chest Port 1 View if patient is in a treatment room. Result Date: 07/12/2024 CLINICAL DATA:  Suspected Sepsis fall EXAM: PORTABLE CHEST 1 VIEW  COMPARISON:  Chest x-ray 05/21/2024 FINDINGS: The heart and mediastinal contours are unchanged. No focal consolidation. No pulmonary edema. Interval development of at least small to moderate right and trace to small left pleural effusions. No pneumothorax. No acute osseous abnormality. IMPRESSION: Interval development of at least small to moderate right and trace to small left pleural effusions. Question loculation on the right. Recommend CT chest with intravenous contrast (not CT PA) for further evaluation. Electronically Signed   By: Morgane  Naveau M.D.   On: 07/12/2024 19:52    EKG: Independently reviewed. Atrial fibrillation, rate 102.   Assessment/Plan   1. Sepsis; cellulitis; colitis  - Febrile, tachycardic, and tachypneic on arrival with acute encephalopathy and stable BP   - Appears to have cellulitis involving lower RLE and right groin; no gas or fluid collection on CT  - Also has diarrhea and CT findings on concerning for colitis  - Continue cefepime  and vancomycin , check C diff and stool pathogen panel, follow cultures and clinical course    2. Acute on chronic HFpEF; b/l pleural effusions; acute hypoxic respiratory failure  - EF was 55-60% on echo from August 2020  - Cardiomegaly, pleural effusions, and pulmonary edema noted on CT; peripheral edema noted; BNP elevated  - Diurese with IV Lasix , monitor weight  and I/Os, update echocardiogram, consider thoracentesis if effusions fail to respond to diuresis    3. Atrial fibrillation  - Continue Eliquis , diltiazem , and Toprol    4. Hyponatremia  - He is hypervolemic  - Monitor closely while diuresing    5. Hypokalemia  - Replacing, will repeat chem panel in am    6. Alcohol abuse; cirrhosis  - Monitor for withdrawal, use Ativan  if needed, supplement vitamins, hold lactulose  given frequent loose stools and concern for colitis   7. Acute encephalopathy  - Somnolent and intermittently disoriented  - Pt's wife notes that he has  intermittent confusion at baseline  - Improved with treatment of fever, likely due to his fever/acute infection  - Continue to acute illness, use delirium precautions    DVT prophylaxis: Eliquis   Code Status: Full  Level of Care: Level of care: Progressive Family Communication: Wife updated by phone from ED   Disposition Plan:  Patient is from: Home Anticipated d/c is to: TBD Anticipated d/c date is: 07/16/24  Patient currently: Pending stool studies, cultures, resolution of sepsis, improved volume status, improved mental status  Consults called: None  Admission status: Inpatient     Evalene GORMAN Sprinkles, MD [facility]  07/12/2024, 11:11 PM

## 2024-07-13 ENCOUNTER — Inpatient Hospital Stay (HOSPITAL_COMMUNITY)

## 2024-07-13 DIAGNOSIS — I5033 Acute on chronic diastolic (congestive) heart failure: Secondary | ICD-10-CM | POA: Diagnosis not present

## 2024-07-13 DIAGNOSIS — K8020 Calculus of gallbladder without cholecystitis without obstruction: Secondary | ICD-10-CM

## 2024-07-13 DIAGNOSIS — R0600 Dyspnea, unspecified: Secondary | ICD-10-CM | POA: Diagnosis not present

## 2024-07-13 DIAGNOSIS — K529 Noninfective gastroenteritis and colitis, unspecified: Secondary | ICD-10-CM | POA: Diagnosis not present

## 2024-07-13 LAB — ECHOCARDIOGRAM COMPLETE
AR max vel: 1.59 cm2
AV Area VTI: 1.52 cm2
AV Area mean vel: 1.51 cm2
AV Mean grad: 8.7 mmHg
AV Peak grad: 15.7 mmHg
Ao pk vel: 1.98 m/s
Area-P 1/2: 3.95 cm2
Calc EF: 67.7 %
Height: 68 in
S' Lateral: 3.5 cm
Single Plane A2C EF: 70.1 %
Single Plane A4C EF: 65.7 %
Weight: 2944 [oz_av]

## 2024-07-13 LAB — CBC
HCT: 28.1 % — ABNORMAL LOW (ref 39.0–52.0)
Hemoglobin: 9.2 g/dL — ABNORMAL LOW (ref 13.0–17.0)
MCH: 32.7 pg (ref 26.0–34.0)
MCHC: 32.7 g/dL (ref 30.0–36.0)
MCV: 100 fL (ref 80.0–100.0)
Platelets: 143 K/uL — ABNORMAL LOW (ref 150–400)
RBC: 2.81 MIL/uL — ABNORMAL LOW (ref 4.22–5.81)
RDW: 20.3 % — ABNORMAL HIGH (ref 11.5–15.5)
WBC: 8.7 K/uL (ref 4.0–10.5)
nRBC: 0.2 % (ref 0.0–0.2)

## 2024-07-13 LAB — COMPREHENSIVE METABOLIC PANEL WITH GFR
ALT: 17 U/L (ref 0–44)
AST: 36 U/L (ref 15–41)
Albumin: 2.6 g/dL — ABNORMAL LOW (ref 3.5–5.0)
Alkaline Phosphatase: 93 U/L (ref 38–126)
Anion gap: 11 (ref 5–15)
BUN: 6 mg/dL — ABNORMAL LOW (ref 8–23)
CO2: 21 mmol/L — ABNORMAL LOW (ref 22–32)
Calcium: 7.8 mg/dL — ABNORMAL LOW (ref 8.9–10.3)
Chloride: 95 mmol/L — ABNORMAL LOW (ref 98–111)
Creatinine, Ser: 0.6 mg/dL — ABNORMAL LOW (ref 0.61–1.24)
GFR, Estimated: >60 mL/min (ref >60)
Glucose, Bld: 117 mg/dL — ABNORMAL HIGH (ref 70–99)
Potassium: 3.4 mmol/L — ABNORMAL LOW (ref 3.5–5.1)
Sodium: 127 mmol/L — ABNORMAL LOW (ref 135–145)
Total Bilirubin: 1.5 mg/dL — ABNORMAL HIGH (ref 0.0–1.2)
Total Protein: 5.6 g/dL — ABNORMAL LOW (ref 6.5–8.1)

## 2024-07-13 LAB — C DIFFICILE QUICK SCREEN W PCR REFLEX
C Diff antigen: NEGATIVE
C Diff interpretation: NOT DETECTED
C Diff toxin: NEGATIVE

## 2024-07-13 LAB — MAGNESIUM: Magnesium: 1.9 mg/dL (ref 1.7–2.4)

## 2024-07-13 LAB — AMMONIA: Ammonia: 23 umol/L (ref 9–35)

## 2024-07-13 MED ORDER — NYSTATIN 100000 UNIT/GM EX CREA
TOPICAL_CREAM | Freq: Two times a day (BID) | CUTANEOUS | Status: DC
  Filled 2024-07-13 (×3): qty 30

## 2024-07-13 MED ORDER — SODIUM CHLORIDE 0.9 % IV SOLN
2.0000 g | Freq: Three times a day (TID) | INTRAVENOUS | Status: DC
  Administered 2024-07-13 – 2024-07-14 (×4): 2 g via INTRAVENOUS
  Filled 2024-07-13 (×4): qty 12.5

## 2024-07-13 MED ORDER — VANCOMYCIN HCL 1250 MG/250ML IV SOLN
1250.0000 mg | Freq: Two times a day (BID) | INTRAVENOUS | Status: DC
  Administered 2024-07-13 – 2024-07-14 (×3): 1250 mg via INTRAVENOUS
  Filled 2024-07-13 (×4): qty 250

## 2024-07-13 NOTE — Plan of Care (Signed)
 Problem: Education: Goal: Knowledge of General Education information will improve Description: Including pain rating scale, medication(s)/side effects and non-pharmacologic comfort measures 07/13/2024 1907 by Drew Taffy SAUNDERS, RN Outcome: Progressing 07/13/2024 1906 by Drew Taffy SAUNDERS, RN Outcome: Progressing   Problem: Health Behavior/Discharge Planning: Goal: Ability to manage health-related needs will improve 07/13/2024 1907 by Drew Taffy SAUNDERS, RN Outcome: Progressing 07/13/2024 1906 by Drew Taffy SAUNDERS, RN Outcome: Progressing   Problem: Clinical Measurements: Goal: Ability to maintain clinical measurements within normal limits will improve 07/13/2024 1907 by Drew Taffy SAUNDERS, RN Outcome: Progressing 07/13/2024 1906 by Drew Taffy SAUNDERS, RN Outcome: Progressing Goal: Will remain free from infection 07/13/2024 1907 by Drew Taffy SAUNDERS, RN Outcome: Progressing 07/13/2024 1906 by Drew Taffy SAUNDERS, RN Outcome: Progressing Goal: Diagnostic test results will improve 07/13/2024 1907 by Drew Taffy SAUNDERS, RN Outcome: Progressing 07/13/2024 1906 by Drew Taffy SAUNDERS, RN Outcome: Progressing Goal: Respiratory complications will improve 07/13/2024 1907 by Drew Taffy SAUNDERS, RN Outcome: Progressing 07/13/2024 1906 by Drew Taffy SAUNDERS, RN Outcome: Progressing Goal: Cardiovascular complication will be avoided 07/13/2024 1907 by Drew Taffy SAUNDERS, RN Outcome: Progressing 07/13/2024 1906 by Drew Taffy SAUNDERS, RN Outcome: Progressing   Problem: Activity: Goal: Risk for activity intolerance will decrease 07/13/2024 1907 by Drew Taffy SAUNDERS, RN Outcome: Progressing 07/13/2024 1906 by Drew Taffy SAUNDERS, RN Outcome: Progressing   Problem: Nutrition: Goal: Adequate nutrition will be maintained 07/13/2024 1907 by Drew Taffy SAUNDERS, RN Outcome: Progressing 07/13/2024 1906 by Drew Taffy SAUNDERS, RN Outcome: Progressing   Problem: Coping: Goal: Level  of anxiety will decrease 07/13/2024 1907 by Drew Taffy SAUNDERS, RN Outcome: Progressing 07/13/2024 1906 by Drew Taffy SAUNDERS, RN Outcome: Progressing   Problem: Elimination: Goal: Will not experience complications related to bowel motility 07/13/2024 1907 by Drew Taffy SAUNDERS, RN Outcome: Progressing 07/13/2024 1906 by Drew Taffy SAUNDERS, RN Outcome: Progressing Goal: Will not experience complications related to urinary retention 07/13/2024 1907 by Drew Taffy SAUNDERS, RN Outcome: Progressing 07/13/2024 1906 by Drew Taffy SAUNDERS, RN Outcome: Progressing   Problem: Pain Managment: Goal: General experience of comfort will improve and/or be controlled 07/13/2024 1907 by Drew Taffy SAUNDERS, RN Outcome: Progressing 07/13/2024 1906 by Drew Taffy SAUNDERS, RN Outcome: Progressing   Problem: Safety: Goal: Ability to remain free from injury will improve 07/13/2024 1907 by Drew Taffy SAUNDERS, RN Outcome: Progressing 07/13/2024 1906 by Drew Taffy SAUNDERS, RN Outcome: Progressing   Problem: Skin Integrity: Goal: Risk for impaired skin integrity will decrease 07/13/2024 1907 by Drew Taffy SAUNDERS, RN Outcome: Progressing 07/13/2024 1906 by Drew Taffy SAUNDERS, RN Outcome: Progressing   Problem: Education: Goal: Ability to demonstrate management of disease process will improve 07/13/2024 1907 by Drew Taffy SAUNDERS, RN Outcome: Progressing 07/13/2024 1906 by Drew Taffy SAUNDERS, RN Outcome: Progressing Goal: Ability to verbalize understanding of medication therapies will improve 07/13/2024 1907 by Drew Taffy SAUNDERS, RN Outcome: Progressing 07/13/2024 1906 by Drew Taffy SAUNDERS, RN Outcome: Progressing Goal: Individualized Educational Video(s) 07/13/2024 1907 by Drew Taffy SAUNDERS, RN Outcome: Progressing 07/13/2024 1906 by Drew Taffy SAUNDERS, RN Outcome: Progressing   Problem: Activity: Goal: Capacity to carry out activities will improve 07/13/2024 1907 by Drew Taffy SAUNDERS, RN Outcome: Progressing 07/13/2024 1906 by Drew Taffy SAUNDERS, RN Outcome: Progressing   Problem: Cardiac: Goal: Ability to achieve and maintain adequate cardiopulmonary perfusion will improve 07/13/2024 1907 by Drew Taffy SAUNDERS, RN Outcome: Progressing 07/13/2024 1906 by Drew Taffy SAUNDERS, RN Outcome: Progressing   Problem: Clinical Measurements: Goal: Ability to avoid or minimize complications of infection will  improve 07/13/2024 1907 by Drew Taffy SAUNDERS, RN Outcome: Progressing 07/13/2024 1906 by Drew Taffy SAUNDERS, RN Outcome: Progressing   Problem: Skin Integrity: Goal: Skin integrity will improve 07/13/2024 1907 by Drew Taffy SAUNDERS, RN Outcome: Progressing 07/13/2024 1906 by Drew Taffy SAUNDERS, RN Outcome: Progressing

## 2024-07-13 NOTE — Plan of Care (Signed)
   Problem: Education: Goal: Knowledge of General Education information will improve Description: Including pain rating scale, medication(s)/side effects and non-pharmacologic comfort measures Outcome: Progressing   Problem: Health Behavior/Discharge Planning: Goal: Ability to manage health-related needs will improve Outcome: Progressing   Problem: Clinical Measurements: Goal: Ability to maintain clinical measurements within normal limits will improve Outcome: Progressing Goal: Will remain free from infection Outcome: Progressing Goal: Diagnostic test results will improve Outcome: Progressing Goal: Respiratory complications will improve Outcome: Progressing Goal: Cardiovascular complication will be avoided Outcome: Progressing   Problem: Activity: Goal: Risk for activity intolerance will decrease Outcome: Progressing   Problem: Nutrition: Goal: Adequate nutrition will be maintained Outcome: Progressing   Problem: Coping: Goal: Level of anxiety will decrease Outcome: Progressing   Problem: Elimination: Goal: Will not experience complications related to bowel motility Outcome: Progressing Goal: Will not experience complications related to urinary retention Outcome: Progressing   Problem: Pain Managment: Goal: General experience of comfort will improve and/or be controlled Outcome: Progressing   Problem: Safety: Goal: Ability to remain free from injury will improve Outcome: Progressing   Problem: Skin Integrity: Goal: Risk for impaired skin integrity will decrease Outcome: Progressing   Problem: Education: Goal: Ability to demonstrate management of disease process will improve Outcome: Progressing Goal: Ability to verbalize understanding of medication therapies will improve Outcome: Progressing Goal: Individualized Educational Video(s) Outcome: Progressing   Problem: Activity: Goal: Capacity to carry out activities will improve Outcome: Progressing    Problem: Cardiac: Goal: Ability to achieve and maintain adequate cardiopulmonary perfusion will improve Outcome: Progressing   Problem: Clinical Measurements: Goal: Ability to avoid or minimize complications of infection will improve Outcome: Progressing   Problem: Skin Integrity: Goal: Skin integrity will improve Outcome: Progressing

## 2024-07-13 NOTE — ED Notes (Signed)
 CCM called for tele admit.

## 2024-07-13 NOTE — ED Notes (Signed)
,  PT cleaned up at this time. Linen changed. Large bm. PT situated to eat at this time.  PT in Enteric precautions.

## 2024-07-13 NOTE — Progress Notes (Signed)
 Pharmacy Antibiotic Note  Jeffrey Randolph is a 68 y.o. male admitted on 07/12/2024 with cellulitis.  Pharmacy has been consulted for Vancomycin  and Cefepime   dosing.  Plan: Vancomycin  1250 mg IV q12h Cefepime  2 g IV q8h  Height: 5' 8 (172.7 cm) Weight: 83.5 kg (184 lb) IBW/kg (Calculated) : 68.4  Temp (24hrs), Avg:100.5 F (38.1 C), Min:98.5 F (36.9 C), Max:104 F (40 C)  Recent Labs  Lab 07/12/24 1915 07/12/24 1923 07/12/24 2248  WBC 8.7  --   --   CREATININE 0.61  --   --   LATICACIDVEN  --  2.6* 1.1    Estimated Creatinine Clearance: 93 mL/min (by C-G formula based on SCr of 0.61 mg/dL).    No Known Allergies  Dail Jeffrey Randolph 07/13/2024 1:01 AM

## 2024-07-13 NOTE — Progress Notes (Signed)
 Progress Note   Patient: Jeffrey Randolph FMW:[id number] DOB: [birth year] DOA: 07/12/2024     1 DOS: the patient was seen and examined on 07/13/2024   Brief hospital course: 68 y.o. male with medical history significant for alcoholism, cirrhosis with ascites, atrial fibrillation on Eliquis , chronic HFpEF, history of CVA, chronic hyponatremia, and admission last month for lower extremity cellulitis complicated by acute CHF who now presents with confusion, lethargy, and diarrhea.   Patient's friend went to check on him today, thought that he looked quite ill, and called EMS.  Patient was found to be febrile and tachycardic.  Patient reports multiple episodes of diarrhea in the past couple days, generalized weakness and fatigue, shortness of breath, and mild cough.  His wife was updated by phone and notes that he frequently has episodes of confusion.    ED Course: Upon arrival to the ED, patient is found to be febrile to 40 C and saturating mid 90s on 2 L/min supplemental oxygen with tachypnea, tachycardia, and stable blood pressure.  Labs are most notable for sodium 127, potassium 3.3, normal WBC, lactate 2.6, INR 2.6, BNP 365, and negative respiratory virus panel.  CTA chest is negative for PE but notable for cardiomegaly, large right and small left pleural effusions, and patchy airspace disease that likely reflects edema.  CT also reveals cirrhosis with splenomegaly, varices, and small ascites, as well as cholelithiasis with nonspecific gallbladder wall thickening and infectious or inflammatory colitis.   Blood and urine cultures were collected in the ED and the patient was given 2 L LR, acetaminophen , vancomycin , cefepime , and Flagyl.  Assessment and Plan: 1. Sepsis; cellulitis; colitis  - Febrile, tachycardic, and tachypneic on arrival with acute encephalopathy and stable BP   - Concerns of RLE cellulitis. CT findings suggestive of colitis in setting of diarrhea - Continue cefepime  and vancomycin , check  C diff and stool pathogen panel   2. Acute on chronic HFpEF; b/l pleural effusions; acute hypoxic respiratory failure  - EF was 55-60% on echo from August 2020  - Cardiomegaly, pleural effusions, and pulmonary edema noted on CT; peripheral edema noted; BNP elevated  - Cont IV lasix  as tolerated -Follow daily wts. Recheck bmet in AM   3. Atrial fibrillation  - Continue Eliquis , diltiazem , and Toprol     4. Hyponatremia  - Pt clinically vol overloaded -Cont lasix  per above -Recheck bmet in AM   5. Hypokalemia  - Cont to replace as tolerated -Recheck bmet in AM   6. Alcohol abuse; cirrhosis  - Cont CIWA as needed -LFT's unremarkable with TB 1.5   7. Acute encephalopathy  - Pt's wife notes that he has intermittent confusion at baseline  - Continue to acute illness, use delirium precautions  -Pt awake, conversant when seen this AM -Will check ammonia  8. Pneumonia -Findings c/w PNA on chest imaging -Cont empiric abx per above      Subjective: Without complaints this AM. Denies   Physical Exam: Vitals:   07/13/24 1358 07/13/24 1420 07/13/24 1511 07/13/24 1600  BP: 120/64  (!) 107/57 102/70  Pulse: 60  87 81  Resp: 18  20 16   Temp: 100.2 F (37.9 C) 99.6 F (37.6 C) 97.7 F (36.5 C)   TempSrc:  Oral Oral   SpO2: 96%  96% 97%  Weight:      Height:       General exam: Awake, laying in bed, in nad Respiratory system: Normal respiratory effort, no wheezing Cardiovascular system: regular rate, s1, s2 Gastrointestinal  system: Soft, nondistended, positive BS Central nervous system: CN2-12 grossly intact, strength intact Extremities: Perfused, no clubbing Skin: Normal skin turgor, no notable skin lesions seen Psychiatry: Mood normal // affect appears normal  Data Reviewed:  Labs reviewed: Na 127, K 3.4, Cr 0.60, WBC 8.7, Hgb 9.2  Family Communication: Pt in room, family not at bedside  Disposition: Status is: Inpatient Remains inpatient appropriate because:  severity of illness  Planned Discharge Destination: Home    Author: Garnette Pelt, MD 07/13/2024 6:18 PM  For on call review [email or web address].

## 2024-07-13 NOTE — ED Notes (Signed)
 Patient had episode of diarrhea. Patient cleaned, meplex applied to sacrum.

## 2024-07-13 NOTE — Progress Notes (Signed)
 PT does not wear BIPAP.

## 2024-07-13 NOTE — ED Notes (Signed)
 PT changed at this time.  PT medicated per MAR. PT able to swallow several pills at a time.  Two identifiers used. Mepilex changed.

## 2024-07-13 NOTE — ED Notes (Signed)
 Messaged pharmacy to send antibiotic.

## 2024-07-13 NOTE — Progress Notes (Signed)
  Echocardiogram 2D Echocardiogram has been performed.  Norleen ORN [facility] 07/13/2024, 10:36 AM

## 2024-07-13 NOTE — ED Notes (Signed)
 Pt cleaned up at this time

## 2024-07-13 NOTE — Hospital Course (Signed)
 68 y.o. male with medical history significant for alcoholism, cirrhosis with ascites, atrial fibrillation on Eliquis , chronic HFpEF, history of CVA, chronic hyponatremia, and admission last month for lower extremity cellulitis complicated by acute CHF who now presents with confusion, lethargy, and diarrhea.   Patient's friend went to check on him today, thought that he looked quite ill, and called EMS.  Patient was found to be febrile and tachycardic.  Patient reports multiple episodes of diarrhea in the past couple days, generalized weakness and fatigue, shortness of breath, and mild cough.  His wife was updated by phone and notes that he frequently has episodes of confusion.    ED Course: Upon arrival to the ED, patient is found to be febrile to 40 C and saturating mid 90s on 2 L/min supplemental oxygen with tachypnea, tachycardia, and stable blood pressure.  Labs are most notable for sodium 127, potassium 3.3, normal WBC, lactate 2.6, INR 2.6, BNP 365, and negative respiratory virus panel.  CTA chest is negative for PE but notable for cardiomegaly, large right and small left pleural effusions, and patchy airspace disease that likely reflects edema.  CT also reveals cirrhosis with splenomegaly, varices, and small ascites, as well as cholelithiasis with nonspecific gallbladder wall thickening and infectious or inflammatory colitis.   Blood and urine cultures were collected in the ED and the patient was given 2 L LR, acetaminophen , vancomycin , cefepime , and Flagyl.

## 2024-07-13 NOTE — ED Notes (Signed)
 Patient cleaned and new brief applied.

## 2024-07-14 ENCOUNTER — Ambulatory Visit (HOSPITAL_COMMUNITY)

## 2024-07-14 DIAGNOSIS — R6520 Severe sepsis without septic shock: Secondary | ICD-10-CM | POA: Diagnosis not present

## 2024-07-14 DIAGNOSIS — G9341 Metabolic encephalopathy: Secondary | ICD-10-CM | POA: Diagnosis not present

## 2024-07-14 DIAGNOSIS — A419 Sepsis, unspecified organism: Secondary | ICD-10-CM | POA: Diagnosis not present

## 2024-07-14 LAB — URINE CULTURE: Culture: >=100000 — AB

## 2024-07-14 LAB — BLOOD CULTURE ID PANEL (REFLEXED) - BCID2

## 2024-07-14 LAB — GASTROINTESTINAL PANEL BY PCR, STOOL (REPLACES STOOL CULTURE)
Adenovirus F40/41: NOT DETECTED
Astrovirus: NOT DETECTED
Campylobacter species: DETECTED — AB
Cryptosporidium: NOT DETECTED
Cyclospora cayetanensis: NOT DETECTED
Entamoeba histolytica: NOT DETECTED
Enteroaggregative E coli (EAEC): NOT DETECTED
Giardia lamblia: NOT DETECTED
Plesimonas shigelloides: NOT DETECTED
Rotavirus A: NOT DETECTED
Salmonella species: NOT DETECTED
Sapovirus (I, II, IV, and V): NOT DETECTED
Shiga like toxin producing E coli (STEC): NOT DETECTED
Shigella/Enteroinvasive E coli (EIEC): NOT DETECTED
Vibrio cholerae: NOT DETECTED
Vibrio species: NOT DETECTED
Yersinia enterocolitica: NOT DETECTED

## 2024-07-14 LAB — COMPREHENSIVE METABOLIC PANEL WITH GFR
ALT: 14 U/L (ref 0–44)
AST: 24 U/L (ref 15–41)
Albumin: 2.4 g/dL — ABNORMAL LOW (ref 3.5–5.0)
Alkaline Phosphatase: 85 U/L (ref 38–126)
Anion gap: 9 (ref 5–15)
BUN: 7 mg/dL — ABNORMAL LOW (ref 8–23)
CO2: 23 mmol/L (ref 22–32)
Calcium: 7.7 mg/dL — ABNORMAL LOW (ref 8.9–10.3)
Chloride: 97 mmol/L — ABNORMAL LOW (ref 98–111)
Creatinine, Ser: 0.5 mg/dL — ABNORMAL LOW (ref 0.61–1.24)
GFR, Estimated: >60 mL/min (ref >60)
Glucose, Bld: 108 mg/dL — ABNORMAL HIGH (ref 70–99)
Potassium: 2.7 mmol/L — CL (ref 3.5–5.1)
Sodium: 129 mmol/L — ABNORMAL LOW (ref 135–145)
Total Bilirubin: 0.9 mg/dL (ref 0.0–1.2)
Total Protein: 5.2 g/dL — ABNORMAL LOW (ref 6.5–8.1)

## 2024-07-14 LAB — CBC
HCT: 25.9 % — ABNORMAL LOW (ref 39.0–52.0)
Hemoglobin: 8.6 g/dL — ABNORMAL LOW (ref 13.0–17.0)
MCH: 32.7 pg (ref 26.0–34.0)
MCHC: 33.2 g/dL (ref 30.0–36.0)
MCV: 98.5 fL (ref 80.0–100.0)
Platelets: 124 K/uL — ABNORMAL LOW (ref 150–400)
RBC: 2.63 MIL/uL — ABNORMAL LOW (ref 4.22–5.81)
RDW: 20.2 % — ABNORMAL HIGH (ref 11.5–15.5)
WBC: 5.1 K/uL (ref 4.0–10.5)
nRBC: 0 % (ref 0.0–0.2)

## 2024-07-14 LAB — MAGNESIUM: Magnesium: 1.8 mg/dL (ref 1.7–2.4)

## 2024-07-14 MED ORDER — POTASSIUM CHLORIDE 10 MEQ/100ML IV SOLN
10.0000 meq | INTRAVENOUS | Status: DC
  Administered 2024-07-14: 10 meq via INTRAVENOUS
  Filled 2024-07-14: qty 100

## 2024-07-14 MED ORDER — POTASSIUM CHLORIDE 20 MEQ PO PACK
60.0000 meq | PACK | ORAL | Status: AC
  Administered 2024-07-14 (×2): 60 meq via ORAL
  Filled 2024-07-14 (×2): qty 3

## 2024-07-14 MED ORDER — PIPERACILLIN-TAZOBACTAM 3.375 G IVPB
3.3750 g | Freq: Three times a day (TID) | INTRAVENOUS | Status: DC
  Administered 2024-07-14 – 2024-07-15 (×4): 3.375 g via INTRAVENOUS
  Filled 2024-07-14 (×4): qty 50

## 2024-07-14 MED ORDER — POTASSIUM CHLORIDE 20 MEQ PO PACK
40.0000 meq | PACK | ORAL | Status: DC
Start: 2024-07-14 — End: 2024-07-14
  Administered 2024-07-14: 40 meq via ORAL
  Filled 2024-07-14: qty 2

## 2024-07-14 MED ORDER — MAGNESIUM OXIDE -MG SUPPLEMENT 400 (240 MG) MG PO TABS
800.0000 mg | ORAL_TABLET | Freq: Two times a day (BID) | ORAL | Status: DC
  Administered 2024-07-14 – 2024-07-16 (×5): 800 mg via ORAL
  Filled 2024-07-14 (×5): qty 2

## 2024-07-14 NOTE — Progress Notes (Signed)
 PHARMACY - PHYSICIAN COMMUNICATION CRITICAL VALUE ALERT - BLOOD CULTURE IDENTIFICATION (BCID)  Jeffrey Randolph is an 68 y.o. male who presented to [facility] on 07/12/2024 with a chief complaint of confusion/lethargy/diarrhea  Assessment:  GNR 1/3 bottles (anaerobic), nothing detected on BCID panel.    - Current infectious workup appears to be for RLE cellulitis and possible intra-abdominal process given CT suggestive of gallbladder thickening and GI PCR panel positive for Campylobacter species, Enterotoxigenic E coli, and Norovirus - Fever trend decreased and WBC wnl  Name of physician (or Provider) Contacted: Dr. Royal  Current antibiotics: cefepime  + vancomycin   Changes to prescribed antibiotics recommended:  - Discontinue vancomycin  given no documented purulence and low suspicion for MRSA cellulitis  - Start piperacillin/tazobactam IV 3.375 mg every 8 hours monotherapy   - Initially recommended continuing cefepime  empirically given GNR not detected on BCID panel and to consider adding metronidazole for empiric anaerobic coverage due to concerns for intra-abdominal processes, but provider wanted to de-escalate to ceftriaxone  and hold off on metronidazole. After further discussion, provider agreeable to piperacillin/tazobactam monotherapy to empirically cover unidentified GNR and possible anaerobes.   Results for orders placed or performed during the hospital encounter of 07/12/24  Blood Culture ID Panel (Reflexed) (Collected: 07/12/2024  6:33 PM)  Result Value Ref Range   Enterococcus faecalis NOT DETECTED NOT DETECTED   Enterococcus Faecium NOT DETECTED NOT DETECTED   Listeria monocytogenes NOT DETECTED NOT DETECTED   Staphylococcus species NOT DETECTED NOT DETECTED   Staphylococcus aureus (BCID) NOT DETECTED NOT DETECTED   Staphylococcus epidermidis NOT DETECTED NOT DETECTED   Staphylococcus lugdunensis NOT DETECTED NOT DETECTED   Streptococcus species NOT DETECTED NOT DETECTED    Streptococcus agalactiae NOT DETECTED NOT DETECTED   Streptococcus pneumoniae NOT DETECTED NOT DETECTED   Streptococcus pyogenes NOT DETECTED NOT DETECTED   A.calcoaceticus-baumannii NOT DETECTED NOT DETECTED   Bacteroides fragilis NOT DETECTED NOT DETECTED   Enterobacterales NOT DETECTED NOT DETECTED   Enterobacter cloacae complex NOT DETECTED NOT DETECTED   Escherichia coli NOT DETECTED NOT DETECTED   Klebsiella aerogenes NOT DETECTED NOT DETECTED   Klebsiella oxytoca NOT DETECTED NOT DETECTED   Klebsiella pneumoniae NOT DETECTED NOT DETECTED   Proteus species NOT DETECTED NOT DETECTED   Salmonella species NOT DETECTED NOT DETECTED   Serratia marcescens NOT DETECTED NOT DETECTED   Haemophilus influenzae NOT DETECTED NOT DETECTED   Neisseria meningitidis NOT DETECTED NOT DETECTED   Pseudomonas aeruginosa NOT DETECTED NOT DETECTED   Stenotrophomonas maltophilia NOT DETECTED NOT DETECTED   Candida albicans NOT DETECTED NOT DETECTED   Candida auris NOT DETECTED NOT DETECTED   Candida glabrata NOT DETECTED NOT DETECTED   Candida krusei NOT DETECTED NOT DETECTED   Candida parapsilosis NOT DETECTED NOT DETECTED   Candida tropicalis NOT DETECTED NOT DETECTED   Cryptococcus neoformans/gattii NOT DETECTED NOT DETECTED    Feliciano Close, PharmD PGY2 Infectious Diseases Pharmacy Resident  07/14/2024 5:07 PM

## 2024-07-14 NOTE — TOC Initial Note (Signed)
 Transition of Care [facility]) - Initial/Assessment Note    Patient Details  Name: Jeffrey Randolph MRN: [id number] Date of Birth: June 15, 1956  [facility] of [facility]) CM/SW Contact:    Andrez JULIANNA George, RN Phone Number: 07/14/2024, 12:17 PM  Clinical Narrative:                 Jeffrey Randolph is a 69 y.o. male with medical history significant for alcoholism, cirrhosis with ascites, atrial fibrillation on Eliquis , chronic HFpEF, history of CVA, chronic hyponatremia, and admission last month for lower extremity cellulitis complicated by acute CHF who now presents with confusion, lethargy, and diarrhea.  Pt is from home alone. He says he has a neighbor that will check on him.  Pt rides a scooter for transportation. He says for longer distances his neighbor assists.  He manages his own medications.   IP Care management following.  Expected Discharge Plan: Home/Self Care Barriers to Discharge: Continued Medical Work up   Patient Goals and CMS Choice            Expected Discharge Plan and Services   Discharge Planning Services: CM Consult   Living arrangements for the past 2 months: Single Family Home                                      Prior Living Arrangements/Services Living arrangements for the past 2 months: Single Family Home Lives with:: Self Patient language and need for interpreter reviewed:: Yes Do you feel safe going back to the place where you live?: Yes        Care giver support system in place?: No (comment) Current home services: DME (walker/ scooter/) Criminal Activity/Legal Involvement Pertinent to Current Situation/Hospitalization: No - Comment as needed  Activities of Daily Living   ADL Screening (condition at time of admission) Independently performs ADLs?: Yes (appropriate for developmental age) Is the patient deaf or have difficulty hearing?: No Does the patient have difficulty seeing, even when wearing glasses/contacts?: No Does the patient have  difficulty concentrating, remembering, or making decisions?: No  Permission Sought/Granted                  Emotional Assessment Appearance:: Appears older than stated age Attitude/Demeanor/Rapport: Engaged Affect (typically observed): Accepting Orientation: : Oriented to Self, Oriented to Place, Oriented to  Time, Oriented to Situation Alcohol / Substance Use: Alcohol Use Psych Involvement: No (comment)  Admission diagnosis:  Tinea cruris [B35.6] Colitis [K52.9] Gallstones [K80.20] Sepsis (HCC) [A41.9] Acute dyspnea [R06.00] Other cirrhosis of liver (HCC) [K74.69] Sepsis due to pneumonia (HCC) [J18.9, A41.9] Patient Active Problem List   Diagnosis Date Noted   Sepsis (HCC) 07/12/2024   Acute respiratory failure with hypoxia (HCC) 07/12/2024   Bilateral pleural effusion 07/12/2024   Colitis 07/12/2024   Cellulitis 06/10/2024   Malnutrition of moderate degree 05/24/2024   Closed right hip fracture (HCC) 05/21/2024   Acute on chronic heart failure with preserved ejection fraction (HFpEF) (HCC) 05/21/2024   Macrocytic anemia 05/21/2024   Skin cancer 05/21/2024   Low back pain 04/15/2024   Alcohol abuse 04/15/2024   Thrombocytopenia 04/15/2024   Lower extremity pain 04/15/2024   RLS (restless legs syndrome) 04/15/2024   Hyponatremia 05/08/2023   History of alcohol abuse 05/08/2023   Diarrhea 05/08/2023   Weakness 05/08/2023   Ascitic fluid 03/20/2022   Hepatic cirrhosis (HCC) 03/20/2022   Atrial fibrillation, chronic (HCC) 03/20/2022   Fall  at home, initial encounter 03/20/2022   Chest pain 03/20/2022   Secondary hypercoagulable state 10/20/2019   Hypertension 05/26/2019   Hypokalemia 05/26/2019   History of stroke 05/26/2019   Hypomagnesemia 05/26/2019   Lumbar spondylosis 08/14/2015   Spinal stenosis of lumbar region 04/20/2015   HNP (herniated nucleus pulposus), lumbar 01/09/2015   Displacement of lumbar intervertebral disc without myelopathy 12/08/2014    PCP:  Montey Lot, PA-C Pharmacy:   [facility] - [place], [place] - [address] [place] [address] [address] Phone: [phone] Fax: [phone]  [facility] [address] [place] [address] Phone: [phone] Fax: [phone]     Social Drivers of Health (SDOH) Social History: SDOH Screenings   Food Insecurity: No Food Insecurity (07/13/2024)  Housing: Low Risk  (07/13/2024)  Transportation Needs: No Transportation Needs (07/13/2024)  Utilities: Not At Risk (07/13/2024)  Alcohol Screen: Low Risk  (05/30/2023)  Social Connections: Moderately Isolated (07/13/2024)  Tobacco Use: Medium Risk (07/12/2024)   SDOH Interventions:     Readmission Risk Interventions    05/09/2023    2:37 PM  Readmission Risk Prevention Plan  Post Dischage Appt Complete  Medication Screening Complete  Transportation Screening Complete

## 2024-07-14 NOTE — Plan of Care (Signed)
   Problem: Education: Goal: Knowledge of General Education information will improve Description: Including pain rating scale, medication(s)/side effects and non-pharmacologic comfort measures Outcome: Progressing   Problem: Health Behavior/Discharge Planning: Goal: Ability to manage health-related needs will improve Outcome: Progressing   Problem: Clinical Measurements: Goal: Ability to maintain clinical measurements within normal limits will improve Outcome: Progressing Goal: Will remain free from infection Outcome: Progressing Goal: Diagnostic test results will improve Outcome: Progressing Goal: Respiratory complications will improve Outcome: Progressing Goal: Cardiovascular complication will be avoided Outcome: Progressing   Problem: Activity: Goal: Risk for activity intolerance will decrease Outcome: Progressing   Problem: Nutrition: Goal: Adequate nutrition will be maintained Outcome: Progressing   Problem: Coping: Goal: Level of anxiety will decrease Outcome: Progressing   Problem: Elimination: Goal: Will not experience complications related to bowel motility Outcome: Progressing Goal: Will not experience complications related to urinary retention Outcome: Progressing   Problem: Pain Managment: Goal: General experience of comfort will improve and/or be controlled Outcome: Progressing   Problem: Safety: Goal: Ability to remain free from injury will improve Outcome: Progressing   Problem: Skin Integrity: Goal: Risk for impaired skin integrity will decrease Outcome: Progressing   Problem: Education: Goal: Ability to demonstrate management of disease process will improve Outcome: Progressing Goal: Ability to verbalize understanding of medication therapies will improve Outcome: Progressing Goal: Individualized Educational Video(s) Outcome: Progressing   Problem: Activity: Goal: Capacity to carry out activities will improve Outcome: Progressing    Problem: Cardiac: Goal: Ability to achieve and maintain adequate cardiopulmonary perfusion will improve Outcome: Progressing   Problem: Clinical Measurements: Goal: Ability to avoid or minimize complications of infection will improve Outcome: Progressing   Problem: Skin Integrity: Goal: Skin integrity will improve Outcome: Progressing

## 2024-07-14 NOTE — Progress Notes (Addendum)
 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 Marlee with nephrology in the past last for that Underlying alcoholic liver cirrhosis with  normal EGD 01/06/2023 follows with Dr. Therisa Pao, [facility] no varices Underlying persistent A-fib diagnosed 12/2018 follows with Dr. Shlomo failed DCCV-previous flecainide  50 twice daily Cardizem  240 Eliquis  5 twice daily Previous lumbar laminectomy Dr. Onetha 2016 Previous CVA additionally Underlying peripheral neuropathy Nasal cancer   10/6 brought to ED feeling ill family and friends brought him in-febrile tachycardic with recent history of multiple episodes of diarrhea generalized   Labs on admit 127 potassium 3.3 BUN/creatinine 5/0.6 alk phos 127 LFTs normal bilirubin 1.8 BNP 365 lactic acid 2.6  WBC 8.7 hemoglobin 10.7 platelet 192 INR 2.6 COVID respiratory viral panel is negative NOROVIRUS positive 1/3 GNR  Imaging CT ABD pelvis chest no pulmonary embolism bilateral pleural effusions R >L patchy airspace disease?  Edema?  Pneumonia-hepatic cirrhosis splenic enlargement upper abdominal varices-gallbladder wall thickening 2/2 gallbladder inflammation-diffuse wall thickening descending and rectosigmoid colon mildly thickened bladder wall Echocardiogram EF 60-65% normal LV mild mitral regurg tricuspid valve regurg moderate   Assessment  & Plan :    Sepsis secondary to norovirus 1/3 GNR Supportive therapy for norovirus Zosyn monotherapy discontinued cefepime  and vancomycin  Not currently on fluids Severe hypokalemia Refusing IV repletion Replacing potassium 60 Q4X2 doses and repeat labs a.m. Magnesium  800 twice daily for now and  repeat labs in a.m. Chronic ethanolism chronic hyponatremia Was given Lasix  which may account for hypokalemia have discontinued IV Lasix  20 Still +1.18 L weight has gone from 83-89 so we will need to reevaluate and maybe add Aldactone  as first-line CIWA not being checked?  Discontinue protocol today and monitor trends--states last drink was 10/1 and he seems reliable Suspect a AECHF Was on Lasix  which has been held-caution and lower dosing in the next 24--Lasix  stopped 10/8 resume in next 1-2 Permanent A-fib CHADVASC >4 Careful use Eliquis  5 twice daily in the setting cirrhosis Continue Cardizem  120, Toprol -XL 12.5- Seems to be in sinus currently Previous lumbar laminectomies At this time holding Naprosyn, Robaxin , Lyrica , Requip  and reimplement slowly Previous CVA Eliquis  only at this time apparently-not on statin?  Outpatient reeval Right nose cancer Needs to follow-up with ENT or whoever sees him Underlying peripheral neuropathy Resuming Lyrica  when relatively stable      Data Reviewed:   Sodium 129 potassium 2.7 BUN/creatinine 7/0.5 WBC 5.1 hemoglobin 8.6 platelet 124 down from 192 on admission)  DVT prophylaxis: Eliquis   Status is: Inpatient Remains inpatient appropriate because: Requires inpatient monitoring     Current Dispo: Inpatient     Subjective:   Awake coherent 2 loose stools today 3 loose stools yesterday but overall feels a little better still feels weak No chest pain no fever no nausea no vomiting Absolutely refuses IV potassium it burns He has been advised about using it but still declines He is eating some but has no appetite No chest pain    Objective + exam Vitals:   07/13/24 2328 07/14/24 0000 07/14/24 0200 07/14/24 0400  BP:  116/65  (!) 102/57  Pulse:  80 74 80  Resp:  17 11  11  Temp: 98 F (36.7 C)     TempSrc: Oral     SpO2:  96% 95% 96%  Weight:      Height:       Filed Weights   07/12/24 1819  Weight: 83.5 kg      Examination: EOMI NCAT he does have a blemish over his left nare with significant bruising that is bloodstained which is consistent with his history of cancer Chest is clear no wheeze ROM is intact Power is 5/5 His right lower extremity looks a little red but this is chronic      Scheduled Meds:  apixaban   5 mg Oral BID   clotrimazole   Topical BID   diltiazem   120 mg Oral Daily   LORazepam   0-4 mg Intravenous Q6H   Followed by   LORazepam   0-4 mg Intravenous Q12H   magnesium  oxide  800 mg Oral BID   metoprolol  succinate  12.5 mg Oral Daily   nystatin cream   Topical BID   pantoprazole   40 mg Oral Daily   potassium chloride   60 mEq Oral Q4H   sodium chloride  flush  3 mL Intravenous Q12H   Continuous Infusions:  ceFEPime  (MAXIPIME ) IV 2 g (07/13/24 2156)   potassium chloride      vancomycin  1,250 mg (07/13/24 1801)    Time  45  Colen Grimes, MD  [facility]

## 2024-07-14 NOTE — TOC CAGE-AID Note (Signed)
 Transition of Care [facility]) - CAGE-AID Screening   Patient Details  Name: Jeffrey Randolph MRN: [id number] Date of Birth: [birth year]  [facility]) CM/SW Contact:    Andrez JULIANNA George, RN Phone Number: 07/14/2024, 12:19 PM   Clinical Narrative:  Pt denied the need for inpatient/ outpatient counseling resources.  CAGE-AID Screening:    Have You Ever Felt You Ought to Cut Down on Your Drinking or Drug Use?: No Have People Annoyed You By Critizing Your Drinking Or Drug Use?: No Have You Felt Bad Or Guilty About Your Drinking Or Drug Use?: No Have You Ever Had a Drink or Used Drugs First Thing In The Morning to Steady Your Nerves or to Get Rid of a Hangover?: No CAGE-AID Score: 0  Substance Abuse Education Offered: Yes (refused)

## 2024-07-15 DIAGNOSIS — A419 Sepsis, unspecified organism: Secondary | ICD-10-CM | POA: Diagnosis not present

## 2024-07-15 DIAGNOSIS — G9341 Metabolic encephalopathy: Secondary | ICD-10-CM | POA: Diagnosis not present

## 2024-07-15 DIAGNOSIS — R6520 Severe sepsis without septic shock: Secondary | ICD-10-CM | POA: Diagnosis not present

## 2024-07-15 LAB — COMPREHENSIVE METABOLIC PANEL WITH GFR
ALT: 40 U/L (ref 0–44)
AST: 100 U/L — ABNORMAL HIGH (ref 15–41)
Albumin: 2.6 g/dL — ABNORMAL LOW (ref 3.5–5.0)
Alkaline Phosphatase: 204 U/L — ABNORMAL HIGH (ref 38–126)
Anion gap: 10 (ref 5–15)
BUN: 5 mg/dL — ABNORMAL LOW (ref 8–23)
CO2: 22 mmol/L (ref 22–32)
Calcium: 8.2 mg/dL — ABNORMAL LOW (ref 8.9–10.3)
Chloride: 97 mmol/L — ABNORMAL LOW (ref 98–111)
Creatinine, Ser: 0.44 mg/dL — ABNORMAL LOW (ref 0.61–1.24)
GFR, Estimated: >60 mL/min (ref >60)
Glucose, Bld: 101 mg/dL — ABNORMAL HIGH (ref 70–99)
Potassium: 3.6 mmol/L (ref 3.5–5.1)
Sodium: 129 mmol/L — ABNORMAL LOW (ref 135–145)
Total Bilirubin: 3.6 mg/dL — ABNORMAL HIGH (ref 0.0–1.2)
Total Protein: 5.9 g/dL — ABNORMAL LOW (ref 6.5–8.1)

## 2024-07-15 MED ORDER — SODIUM CHLORIDE 0.9 % IV SOLN
1.0000 g | Freq: Three times a day (TID) | INTRAVENOUS | Status: DC
  Administered 2024-07-15 – 2024-07-16 (×3): 1 g via INTRAVENOUS
  Filled 2024-07-15 (×3): qty 20

## 2024-07-15 MED ORDER — SODIUM CHLORIDE 0.9 % IV SOLN
500.0000 mg | INTRAVENOUS | Status: DC
  Filled 2024-07-15: qty 5

## 2024-07-15 NOTE — Progress Notes (Signed)
 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 Marlee with nephrology in the past last for that Underlying alcoholic liver cirrhosis with  normal EGD 01/06/2023 follows with Dr. Therisa Pao, [facility] no varices Underlying persistent A-fib diagnosed 12/2018 follows with Dr. Shlomo failed DCCV-previous flecainide  50 twice daily Cardizem  240 Eliquis  5 twice daily Previous lumbar laminectomy Dr. Onetha 2016 Previous CVA additionally Underlying peripheral neuropathy Nasal cancer   10/6 brought to ED feeling ill family and friends brought him in-febrile tachycardic with recent history of multiple episodes of diarrhea generalized   Labs on admit 127 potassium 3.3 BUN/creatinine 5/0.6 alk phos 127 LFTs normal bilirubin 1.8 BNP 365 lactic acid 2.6  WBC 8.7 hemoglobin 10.7 platelet 192 INR 2.6 COVID respiratory viral panel is negative NOROVIRUS positive 1/3 GNR  Imaging CT ABD pelvis chest no pulmonary embolism bilateral pleural effusions R >L patchy airspace disease?  Edema?  Pneumonia-hepatic cirrhosis splenic enlargement upper abdominal varices-gallbladder wall thickening 2/2 gallbladder inflammation-diffuse wall thickening descending and rectosigmoid colon mildly thickened bladder wall Echocardiogram EF 60-65% normal LV mild mitral regurg tricuspid valve regurg moderate   Assessment  & Plan :    Sepsis secondary to norovirus 1/3 GNR Supportive therapy for norovirus Zosyn monotherapy discontinued cefepime  and vancomycin  and await finalization of cultures to be able to de-escalate to oral antibiotics Not currently on fluids Severe hypokalemia replaced Potassium now within normal  limits, monitor trends of labs Chronic ethanolism chronic hyponatremia Possible decompensated cirrhosis versus cholelithiasis Bilirubin has jumped to 3.6 with elevation of AST to ALT 100/40 Obtain INR with next labs No abd pain whatsoever in RUQ--cholecystitis on admit  CT scan was over read as saying that this is cirrhotic but if his labs continue to trend upward he may need general surgery evaluation Patient not withdrawing protocol has been discontinued Suspect a AECHF Was on Lasix  which has been held-caution and lower dosing in the next 24--Lasix  stopped 10/8  Will place on every other day Lasix  at discharge Permanent A-fib CHADVASC >4 Careful use Eliquis  5 twice daily in the setting cirrhosis-May need to switch to heparin if HIDA scan turns positive Continue Cardizem  120, Toprol -XL 12.5- Seems to be in sinus currently Previous lumbar laminectomies At this time holding Naprosyn, Robaxin , Lyrica , Requip  and reimplement slowly Previous CVA Eliquis  only at this time apparently-not on statin?  Outpatient reeval Right nose cancer Needs to follow-up with ENT  Underlying peripheral neuropathy Resuming Lyrica  when relatively stable      Data Reviewed:   Sodium 129 potassium 3.6 BUN/creatinine 5/0.4 Alk phos 204 AST/ALT 100/40 Bilirubin 3.6  DVT prophylaxis: Eliquis   Status is: Inpatient Remains inpatient appropriate because: Requires inpatient monitoring     Current Dispo: Inpatient     Subjective:    2 stools today Still unformed No fever no chills no abdominal pain seems to be eating comfortably no nausea     Objective + exam Vitals:   07/15/24 0008 07/15/24 0400 07/15/24 0500 07/15/24 0800  BP: 112/69 116/71  130/68  Pulse: 77 75  84  Resp: 14 11  18   Temp: 98.8 F (37.1 C) 98.6 F (37 C)  98.2 F (36.8  C)  TempSrc: Oral Oral  Oral  SpO2: 97% 97%  97%  Weight:   87.8 kg   Height:       Filed Weights   07/12/24 1819 07/14/24 0500 07/15/24 0500   Weight: 83.5 kg 89.7 kg 87.8 kg     Examination:  Blemish on left nare Chest is clear Abdomen distended no rebound no right upper quadrant tenderness     Scheduled Meds:  apixaban   5 mg Oral BID   clotrimazole   Topical BID   diltiazem   120 mg Oral Daily   magnesium  oxide  800 mg Oral BID   metoprolol  succinate  12.5 mg Oral Daily   nystatin cream   Topical BID   pantoprazole   40 mg Oral Daily   sodium chloride  flush  3 mL Intravenous Q12H   Continuous Infusions:  piperacillin-tazobactam (ZOSYN)  IV 3.375 g (07/15/24 0631)    Time  45  Jai-Gurmukh Nathan Moctezuma, MD  [facility]

## 2024-07-15 NOTE — Plan of Care (Signed)
   Problem: Education: Goal: Knowledge of General Education information will improve Description: Including pain rating scale, medication(s)/side effects and non-pharmacologic comfort measures Outcome: Progressing   Problem: Health Behavior/Discharge Planning: Goal: Ability to manage health-related needs will improve Outcome: Progressing   Problem: Clinical Measurements: Goal: Ability to maintain clinical measurements within normal limits will improve Outcome: Progressing Goal: Will remain free from infection Outcome: Progressing Goal: Diagnostic test results will improve Outcome: Progressing Goal: Respiratory complications will improve Outcome: Progressing Goal: Cardiovascular complication will be avoided Outcome: Progressing   Problem: Activity: Goal: Risk for activity intolerance will decrease Outcome: Progressing   Problem: Nutrition: Goal: Adequate nutrition will be maintained Outcome: Progressing   Problem: Coping: Goal: Level of anxiety will decrease Outcome: Progressing   Problem: Elimination: Goal: Will not experience complications related to bowel motility Outcome: Progressing Goal: Will not experience complications related to urinary retention Outcome: Progressing   Problem: Pain Managment: Goal: General experience of comfort will improve and/or be controlled Outcome: Progressing   Problem: Safety: Goal: Ability to remain free from injury will improve Outcome: Progressing   Problem: Skin Integrity: Goal: Risk for impaired skin integrity will decrease Outcome: Progressing   Problem: Education: Goal: Ability to demonstrate management of disease process will improve Outcome: Progressing Goal: Ability to verbalize understanding of medication therapies will improve Outcome: Progressing Goal: Individualized Educational Video(s) Outcome: Progressing   Problem: Activity: Goal: Capacity to carry out activities will improve Outcome: Progressing    Problem: Cardiac: Goal: Ability to achieve and maintain adequate cardiopulmonary perfusion will improve Outcome: Progressing   Problem: Clinical Measurements: Goal: Ability to avoid or minimize complications of infection will improve Outcome: Progressing   Problem: Skin Integrity: Goal: Skin integrity will improve Outcome: Progressing

## 2024-07-15 NOTE — Progress Notes (Signed)
 PHARMACY - PHYSICIAN COMMUNICATION CRITICAL VALUE ALERT - BLOOD CULTURE IDENTIFICATION (BCID)  Jeffrey Randolph is an 68 y.o. male who presented to [facility] on 07/12/2024 with a chief complaint of confusion/lethargy/diarrhea. Initial infectious workup appears to be for RLE cellulitis and possible intra-abdominal process given CT suggestive of gallbladder thickening and GI PCR panel positive for Campylobacter species, Enterotoxigenic E coli, and Norovirus.   Patient now has Campylobacter coli bacteremia (currently growing in 1 anaerobic bottles) and GNR growing in 1 aerobic blood culture bottle (unidentified)  Assessment:  GNR 2/4 bottles, Campylobacter coli  - BCID ran on the anaerobic bottle on 07/14/24 = nothing detected  - Per microbiology lab, GNR now growing in aerobic bottle but will not run BCID given it was run on the anaerobic bottle already   Name of physician (or Provider) Contacted: Dr. Royal  Current antibiotics: piperacillin/tazobactam  Changes to prescribed antibiotics recommended:  - Start meropenem 1g every 8 hours  - Monitor clinical progression, infectious markers (fever and WBC trends), and culture data   Results for orders placed or performed during the hospital encounter of 07/12/24  Blood Culture ID Panel (Reflexed) (Collected: 07/12/2024  6:33 PM)  Result Value Ref Range   Enterococcus faecalis NOT DETECTED NOT DETECTED   Enterococcus Faecium NOT DETECTED NOT DETECTED   Listeria monocytogenes NOT DETECTED NOT DETECTED   Staphylococcus species NOT DETECTED NOT DETECTED   Staphylococcus aureus (BCID) NOT DETECTED NOT DETECTED   Staphylococcus epidermidis NOT DETECTED NOT DETECTED   Staphylococcus lugdunensis NOT DETECTED NOT DETECTED   Streptococcus species NOT DETECTED NOT DETECTED   Streptococcus agalactiae NOT DETECTED NOT DETECTED   Streptococcus pneumoniae NOT DETECTED NOT DETECTED   Streptococcus pyogenes NOT DETECTED NOT DETECTED   A.calcoaceticus-baumannii  NOT DETECTED NOT DETECTED   Bacteroides fragilis NOT DETECTED NOT DETECTED   Enterobacterales NOT DETECTED NOT DETECTED   Enterobacter cloacae complex NOT DETECTED NOT DETECTED   Escherichia coli NOT DETECTED NOT DETECTED   Klebsiella aerogenes NOT DETECTED NOT DETECTED   Klebsiella oxytoca NOT DETECTED NOT DETECTED   Klebsiella pneumoniae NOT DETECTED NOT DETECTED   Proteus species NOT DETECTED NOT DETECTED   Salmonella species NOT DETECTED NOT DETECTED   Serratia marcescens NOT DETECTED NOT DETECTED   Haemophilus influenzae NOT DETECTED NOT DETECTED   Neisseria meningitidis NOT DETECTED NOT DETECTED   Pseudomonas aeruginosa NOT DETECTED NOT DETECTED   Stenotrophomonas maltophilia NOT DETECTED NOT DETECTED   Candida albicans NOT DETECTED NOT DETECTED   Candida auris NOT DETECTED NOT DETECTED   Candida glabrata NOT DETECTED NOT DETECTED   Candida krusei NOT DETECTED NOT DETECTED   Candida parapsilosis NOT DETECTED NOT DETECTED   Candida tropicalis NOT DETECTED NOT DETECTED   Cryptococcus neoformans/gattii NOT DETECTED NOT DETECTED    Feliciano Close, PharmD PGY2 Infectious Diseases Pharmacy Resident  07/15/2024 5:02 PM

## 2024-07-15 NOTE — Progress Notes (Signed)
   07/15/24 1400  Mobility  Activity Ambulated with assistance  Level of Assistance Contact guard assist, steadying assist  Assistive Device Front wheel walker  Distance Ambulated (ft) 10 ft  Activity Response Tolerated fair  Mobility Referral Yes  Mobility visit 1 Mobility  Mobility Specialist Start Time (ACUTE ONLY) 1400  Mobility Specialist Stop Time (ACUTE ONLY) 1410  Mobility Specialist Time Calculation (min) (ACUTE ONLY) 10 min   Mobility Specialist: Progress Note  Pt agreeable to mobility session - MS responding to call light- received standing in room. C/o bottom soreness. Returned to chair with all needs met - call bell within reach.   Additional comments: Pt redirected back to chair. Left with NT and RN present.   Virgle Boards, BS Mobility Specialist Please contact via SecureChat or  Rehab office at [phone].

## 2024-07-16 ENCOUNTER — Other Ambulatory Visit (HOSPITAL_COMMUNITY): Payer: Self-pay

## 2024-07-16 DIAGNOSIS — R6520 Severe sepsis without septic shock: Secondary | ICD-10-CM | POA: Diagnosis not present

## 2024-07-16 DIAGNOSIS — G9341 Metabolic encephalopathy: Secondary | ICD-10-CM | POA: Diagnosis not present

## 2024-07-16 DIAGNOSIS — A419 Sepsis, unspecified organism: Secondary | ICD-10-CM | POA: Diagnosis not present

## 2024-07-16 LAB — CBC WITH DIFFERENTIAL/PLATELET
Abs Immature Granulocytes: 0.27 K/uL — ABNORMAL HIGH (ref 0.00–0.07)
Basophils Absolute: 0.1 K/uL (ref 0.0–0.1)
Basophils Relative: 3 %
Eosinophils Absolute: 0.3 K/uL (ref 0.0–0.5)
Eosinophils Relative: 6 %
HCT: 28.3 % — ABNORMAL LOW (ref 39.0–52.0)
Hemoglobin: 9.4 g/dL — ABNORMAL LOW (ref 13.0–17.0)
Immature Granulocytes: 7 %
Lymphocytes Relative: 16 %
Lymphs Abs: 0.6 K/uL — ABNORMAL LOW (ref 0.7–4.0)
MCH: 32.5 pg (ref 26.0–34.0)
MCHC: 33.2 g/dL (ref 30.0–36.0)
MCV: 97.9 fL (ref 80.0–100.0)
Monocytes Absolute: 0.5 K/uL (ref 0.1–1.0)
Monocytes Relative: 12 %
Neutro Abs: 2.2 K/uL (ref 1.7–7.7)
Neutrophils Relative %: 56 %
Platelets: 128 K/uL — ABNORMAL LOW (ref 150–400)
RBC: 2.89 MIL/uL — ABNORMAL LOW (ref 4.22–5.81)
RDW: 20.2 % — ABNORMAL HIGH (ref 11.5–15.5)
Smear Review: NORMAL
WBC: 3.9 K/uL — ABNORMAL LOW (ref 4.0–10.5)
nRBC: 0 % (ref 0.0–0.2)

## 2024-07-16 LAB — COMPREHENSIVE METABOLIC PANEL WITH GFR
ALT: 43 U/L (ref 0–44)
AST: 89 U/L — ABNORMAL HIGH (ref 15–41)
Albumin: 2.6 g/dL — ABNORMAL LOW (ref 3.5–5.0)
Alkaline Phosphatase: 194 U/L — ABNORMAL HIGH (ref 38–126)
Anion gap: 10 (ref 5–15)
BUN: 6 mg/dL — ABNORMAL LOW (ref 8–23)
CO2: 20 mmol/L — ABNORMAL LOW (ref 22–32)
Calcium: 8 mg/dL — ABNORMAL LOW (ref 8.9–10.3)
Chloride: 97 mmol/L — ABNORMAL LOW (ref 98–111)
Creatinine, Ser: 0.45 mg/dL — ABNORMAL LOW (ref 0.61–1.24)
GFR, Estimated: >60 mL/min (ref >60)
Glucose, Bld: 106 mg/dL — ABNORMAL HIGH (ref 70–99)
Potassium: 3.2 mmol/L — ABNORMAL LOW (ref 3.5–5.1)
Sodium: 127 mmol/L — ABNORMAL LOW (ref 135–145)
Total Bilirubin: 2.5 mg/dL — ABNORMAL HIGH (ref 0.0–1.2)
Total Protein: 5.8 g/dL — ABNORMAL LOW (ref 6.5–8.1)

## 2024-07-16 LAB — CULTURE, BLOOD (ROUTINE X 2): Culture: NO GROWTH — AB

## 2024-07-16 LAB — PROTIME-INR
INR: 1.9 — ABNORMAL HIGH (ref 0.8–1.2)
Prothrombin Time: 23.1 s — ABNORMAL HIGH (ref 11.4–15.2)

## 2024-07-16 MED ORDER — MAGNESIUM OXIDE -MG SUPPLEMENT 400 (240 MG) MG PO TABS
800.0000 mg | ORAL_TABLET | Freq: Two times a day (BID) | ORAL | 0 refills | Status: AC
  Filled 2024-07-16: qty 16, 4d supply, fill #0

## 2024-07-16 MED ORDER — FUROSEMIDE 40 MG PO TABS
40.0000 mg | ORAL_TABLET | Freq: Every day | ORAL | Status: DC
  Administered 2024-07-16: 40 mg via ORAL
  Filled 2024-07-16: qty 1

## 2024-07-16 MED ORDER — POTASSIUM CHLORIDE CRYS ER 20 MEQ PO TBCR
40.0000 meq | EXTENDED_RELEASE_TABLET | Freq: Every day | ORAL | 0 refills | Status: DC
  Filled 2024-07-16: qty 10, 5d supply, fill #0

## 2024-07-16 MED ORDER — POTASSIUM CHLORIDE CRYS ER 20 MEQ PO TBCR
40.0000 meq | EXTENDED_RELEASE_TABLET | Freq: Every day | ORAL | Status: DC
  Administered 2024-07-16: 40 meq via ORAL
  Filled 2024-07-16: qty 4

## 2024-07-16 MED ORDER — POTASSIUM CHLORIDE CRYS ER 20 MEQ PO TBCR
20.0000 meq | EXTENDED_RELEASE_TABLET | Freq: Every day | ORAL | 0 refills | Status: AC
  Filled 2024-07-16: qty 10, 10d supply, fill #0

## 2024-07-16 MED ORDER — SPIRONOLACTONE 25 MG PO TABS
100.0000 mg | ORAL_TABLET | Freq: Every day | ORAL | Status: DC
  Administered 2024-07-16: 100 mg via ORAL
  Filled 2024-07-16: qty 4

## 2024-07-16 MED ORDER — AZITHROMYCIN 500 MG PO TABS
500.0000 mg | ORAL_TABLET | Freq: Every day | ORAL | 0 refills | Status: AC
  Filled 2024-07-16: qty 9, 9d supply, fill #0

## 2024-07-16 MED ORDER — ROPINIROLE HCL 1 MG PO TABS: 1.0000 mg | ORAL_TABLET | Freq: Every day | ORAL | Status: DC

## 2024-07-16 MED ORDER — FUROSEMIDE 40 MG PO TABS: 40.0000 mg | ORAL_TABLET | Freq: Every day | ORAL | Status: AC

## 2024-07-16 MED ORDER — SPIRONOLACTONE 25 MG PO TABS
25.0000 mg | ORAL_TABLET | Freq: Every day | ORAL | 1 refills | Status: AC
  Filled 2024-07-16: qty 30, 30d supply, fill #0

## 2024-07-16 NOTE — Plan of Care (Signed)
  Problem: Education: Goal: Knowledge of General Education information will improve Description: Including pain rating scale, medication(s)/side effects and non-pharmacologic comfort measures Outcome: Completed/Met   Problem: Health Behavior/Discharge Planning: Goal: Ability to manage health-related needs will improve Outcome: Completed/Met   Problem: Clinical Measurements: Goal: Ability to maintain clinical measurements within normal limits will improve Outcome: Completed/Met Goal: Will remain free from infection Outcome: Completed/Met Goal: Diagnostic test results will improve Outcome: Completed/Met Goal: Respiratory complications will improve Outcome: Completed/Met Goal: Cardiovascular complication will be avoided Outcome: Completed/Met   Problem: Activity: Goal: Risk for activity intolerance will decrease Outcome: Completed/Met   Problem: Nutrition: Goal: Adequate nutrition will be maintained Outcome: Completed/Met   Problem: Coping: Goal: Level of anxiety will decrease Outcome: Completed/Met   Problem: Elimination: Goal: Will not experience complications related to bowel motility Outcome: Completed/Met Goal: Will not experience complications related to urinary retention Outcome: Completed/Met   Problem: Pain Managment: Goal: General experience of comfort will improve and/or be controlled Outcome: Completed/Met   Problem: Safety: Goal: Ability to remain free from injury will improve Outcome: Completed/Met   Problem: Skin Integrity: Goal: Risk for impaired skin integrity will decrease Outcome: Completed/Met   Problem: Education: Goal: Ability to demonstrate management of disease process will improve Outcome: Completed/Met Goal: Ability to verbalize understanding of medication therapies will improve Outcome: Completed/Met Goal: Individualized Educational Video(s) Outcome: Completed/Met   Problem: Activity: Goal: Capacity to carry out activities will  improve Outcome: Completed/Met   Problem: Cardiac: Goal: Ability to achieve and maintain adequate cardiopulmonary perfusion will improve Outcome: Completed/Met   Problem: Clinical Measurements: Goal: Ability to avoid or minimize complications of infection will improve Outcome: Completed/Met   Problem: Skin Integrity: Goal: Skin integrity will improve Outcome: Completed/Met

## 2024-07-16 NOTE — Progress Notes (Signed)
 Mobility Specialist: Progress Note   07/16/24 1100  Mobility  Activity Ambulated independently  Level of Assistance Modified independent, requires aide device or extra time  Assistive Device Front wheel walker  Distance Ambulated (ft) 300 ft  Activity Response Tolerated well  Mobility Referral Yes  Mobility visit 1 Mobility  Mobility Specialist Start Time (ACUTE ONLY) 1047  Mobility Specialist Stop Time (ACUTE ONLY) 1102  Mobility Specialist Time Calculation (min) (ACUTE ONLY) 15 min    Pt received in chair, agreeable to mobility session. ModI throughout. C/o LE soreness/stiffness from limited mobility the past couple days. No unsteadiness or LOB noted. Returned to room. Left in chair with all needs met, call bell in reach.   Jeffrey Randolph Mobility Specialist Please contact via SecureChat or Rehab office at [phone]

## 2024-07-16 NOTE — Discharge Summary (Signed)
 Physician Discharge Summary  Jeffrey Randolph FMW:[id number] DOB: 1955/11/24 DOA: 07/12/2024  PCP: Montey Lot, PA-C  Admit date: 07/12/2024 Discharge date: 07/16/2024  Time spent: 35 minutes  Recommendations for Outpatient Follow-up:  Recommend Chem-12 CBC INR in 1 week given slightly elevated LFTs probably from Campylobacter sepsis on admission Does need to quit drinking-has been counseled Requires outpatient close follow-up gastroenterology CC Dr. Ruel gastroenterology at [facility] to ensure that variceal workup as well as adjustment of diuretics is performed I have de-escalated several medications associated with polypharmacy and he should have these were implemented by his PCP please see the list of changes below Please adjust his Lasix /Aldactone  based on labs  Discharge Diagnoses:  MAIN problem for hospitalization   Campylobacter sepsis Norovirus sepsis on admission  Please see below for itemized issues addressed in HOpsital- refer to other progress notes for clarity if needed  Discharge Condition: Improved  Diet recommendation: Heart healthy  Filed Weights   07/14/24 0500 07/15/24 0500 07/16/24 0453  Weight: 89.7 kg 87.8 kg 86.4 kg    History of present illness:  68 year old 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 Marlee with nephrology in the past last for that Underlying alcoholic liver cirrhosis with  normal EGD 01/06/2023 follows with Dr. Therisa Pao, [facility] no varices Underlying persistent A-fib diagnosed 12/2018 follows with Dr. Shlomo failed DCCV-previous flecainide  50 twice daily Cardizem  240 Eliquis  5 twice daily Previous lumbar laminectomy Dr. Onetha 2016 Previous CVA additionally Underlying peripheral neuropathy Nasal cancer   10/6 brought to ED feeling ill family and friends brought him in-febrile tachycardic with recent history of  multiple episodes of diarrhea generalized     Labs on admit 127 potassium 3.3 BUN/creatinine 5/0.6 alk phos 127 LFTs normal bilirubin 1.8 BNP 365 lactic acid 2.6  WBC 8.7 hemoglobin 10.7 platelet 192 INR 2.6 COVID respiratory viral panel is negative NOROVIRUS positive Also grew Campylobacter and bloodstream   Imaging CT ABD pelvis chest no pulmonary embolism bilateral pleural effusions R >L patchy airspace disease?  Edema?  Pneumonia-hepatic cirrhosis splenic enlargement upper abdominal varices-gallbladder wall thickening 2/2 gallbladder inflammation-diffuse wall thickening descending and rectosigmoid colon mildly thickened bladder wall Echocardiogram EF 60-65% normal LV mild mitral regurg tricuspid valve regurg moderate    Assessment  & Plan :      Sepsis 2/2 Campylobacter bacteremia, Sepsis 2/2 norovirus Supportive therapy for norovirus Antibiotics were adjusted several times first was on Zosyn and then was switched to cefepime  and vanc but eventually was switched to meropenem and then azithromycin to complete a 2-week course of therapy He had no diarrhea at time of discharge was ambulatory and was quite insistent on going home as he wanted to leave the hospital but as he was stable I felt there was no real contraindication from our end Severe hypokalemia replaced Hypokalemia probably secondary to continued diarrhea during hospital stay which finally slowed down Limited course of potassium has been ordered for patient mandatory labs in 3 to 5 days CC PCP CC GI Note adjustment of medications and diuretics as below which may need to be tailored Chronic ethanolism chronic hyponatremia Possible decompensated cirrhosis versus cholelithiasis Transaminitis was secondary probably to sepsis from Campylobacter and this is improving on 10/10 We felt that given paucity of exam findings, right upper quadrant pain, nausea vomiting inability to take in p.o. that the gallbladder wall thickening was  secondary to some of his ascites--- we contemplated  a HIDA scan but then deferred the same given again paucity of findings and he will need outpatient follow-up with I have adjusted his diuretics at discharge to Lasix  once daily and have added a small dose of Aldactone  to help mobilize splanchnic ascites He does need outpatient follow-up with Dr. Ruel--- he will need further workup including a urinary sodium to ensure that he is not retaining and should have this directed by his gastroenterologist He should have variceal eval if this has not been done in the past year to year and a half as per GI Inirtially suspected AECHF Was given several rounds of Lasix  but became severely hypokalemic etc. See above diuretic recommendations Permanent A-fib CHADVASC >4 Careful use Eliquis  5 twice daily in the setting cirrhosis- Continue Cardizem  120, Toprol -XL 12.5- Seems to be in sinus currently Previous lumbar laminectomies I Previous CVA Eliquis  only at this time apparently-not on statin?  Outpatient reeval Right nose cancer Patient is established with [facility] dermatology and should follow-up for cancer of the nose Underlying peripheral neuropathy Resumed meds as appropriate as per [facility] below as he is coherent    Discharge Exam: Vitals:   07/16/24 0515 07/16/24 0824  BP: 125/69 124/64  Pulse:    Resp: 14   Temp: 98.2 F (36.8 C) 98 F (36.7 C)  SpO2: 95%     Subj on day of d/c   Awake alert coherent pleasant no distress Ambulatory in the room No stool overnight Looks overall well No nausea vomiting whatsoever no dysphagia ate a full meal last night Asking to go home.today   General Exam on discharge  EOMI NCAT no focal deficit no icterus-lesion over left nare seems scabbed and bloody but scabbed over well Neck soft supple S1-S2 no murmur telemetry shows A-fib confirmed on monitors Abdomen is somewhat distended cannot really appreciate HSM He has trace lower extremity  edema  Discharge Instructions   Discharge Instructions     Diet - low sodium heart healthy   Complete by: As directed    Discharge instructions   Complete by: As directed    Finish all of the azithromycin this is the antibiotic that you need to complete the treatment for the diarrhea-do not miss or leave the medication and complete as you can have severe diarrhea from this Your potassium is still little bit low but is improved from since your last checks I would give you several days of potassium and magnesium  to replace and you should follow-up closely in 1 week to get some lab work to check your kidneys and liver try not to drink too much fluid  Noticed that I have put you back on a smaller dose of once daily Lasix  and have added a medication called Aldactone -it is very important to get kidney labs in the near future to make sure that you and your labs are doing fair You should follow-up with whoever follows you for your cirrhosis to get routine outpatient follow-up-usually you do need something called an endoscope to look for bleeding areas in your gut  Should you have high-grade fever chills nausea vomiting chest pain please come back to the emergency room  I will send an inbox message to your gastroenterologist in [place] Dr. Ruel and let him know that you need care coordination   Increase activity slowly   Complete by: As directed    No wound care   Complete by: As directed       Allergies as of 07/16/2024   No  Known Allergies      Medication List     STOP taking these medications    methocarbamol  500 MG tablet Commonly known as: ROBAXIN    naproxen sodium 220 MG tablet Commonly known as: ALEVE   oxyCODONE  5 MG immediate release tablet Commonly known as: Oxy IR/ROXICODONE    pregabalin  200 MG capsule Commonly known as: LYRICA    vitamin D3 25 MCG tablet Commonly known as: CHOLECALCIFEROL        TAKE these medications    apixaban  5 MG Tabs  tablet Commonly known as: ELIQUIS  Take 1 tablet (5 mg total) by mouth 2 (two) times daily.   azithromycin 500 MG tablet Commonly known as: Zithromax Take 1 tablet (500 mg total) by mouth daily for 9 days.   cyanocobalamin  1000 MCG tablet Commonly known as: VITAMIN B12 Take 1 tablet (1,000 mcg total) by mouth daily.   diltiazem  120 MG 24 hr capsule Commonly known as: CARDIZEM  CD Take 1 capsule (120 mg total) by mouth daily.   folic acid  1 MG tablet Commonly known as: FOLVITE  Take 1 tablet (1 mg total) by mouth daily.   furosemide  40 MG tablet Commonly known as: LASIX  Take 1 tablet (40 mg total) by mouth daily. What changed: when to take this   magnesium  oxide 400 (240 Mg) MG tablet Commonly known as: MAG-OX Take 2 tablets (800 mg total) by mouth 2 (two) times daily.   metoprolol  succinate 25 MG 24 hr tablet Commonly known as: TOPROL -XL Take 0.5 tablets (12.5 mg total) by mouth daily.   omeprazole 40 MG capsule Commonly known as: PRILOSEC Take 40 mg by mouth daily.   potassium chloride  SA 20 MEQ tablet Commonly known as: KLOR-CON  M Take 1 tablet (20 mEq total) by mouth daily.   rOPINIRole  0.5 MG tablet Commonly known as: REQUIP  Take 1 mg by mouth at bedtime.   spironolactone  25 MG tablet Commonly known as: ALDACTONE  Take 1 tablet (25 mg total) by mouth daily.       No Known Allergies    The results of significant diagnostics from this hospitalization (including imaging, microbiology, ancillary and laboratory) are listed below for reference.    Significant Diagnostic Studies: ECHOCARDIOGRAM COMPLETE Result Date: 07/13/2024    ECHOCARDIOGRAM REPORT   Patient Name:   [facility] Date of Exam: 07/13/2024 Medical Rec #:  [id number]   Height:       68.0 in Accession #:    [id number]  Weight:       184.0 lb Date of Birth:  Feb 13, 1956   BSA:          1.973 m Patient Age:    68 years    BP:           120/65 mmHg Patient Gender: M           HR:           94 bpm. Exam  Location:  Inpatient Procedure: 2D Echo (Both Spectral and Color Flow Doppler were utilized during            procedure). Indications:    CHF  History:        Patient has prior history of Echocardiogram examinations. CHF.  Sonographer:    Norleen Amour Referring Phys: [phone] TIMOTHY S OPYD IMPRESSIONS  1. Left ventricular ejection fraction, by estimation, is 60 to 65%. The left ventricle has normal function. The left ventricle has no regional wall motion abnormalities. Left ventricular diastolic function could not be evaluated.  2. Right ventricular  systolic function is normal. The right ventricular size is normal. There is mildly elevated pulmonary artery systolic pressure.  3. Left atrial size was mildly dilated.  4. The mitral valve is normal in structure. Mild mitral valve regurgitation. No evidence of mitral stenosis.  5. Tricuspid valve regurgitation is moderate.  6. The aortic valve is normal in structure. There is moderate calcification of the aortic valve. Aortic valve regurgitation is not visualized. Mild aortic valve stenosis. Aortic valve area, by VTI measures 1.52 cm. Aortic valve mean gradient measures 8.7 mmHg. Aortic valve Vmax measures 1.98 m/s.  7. The inferior vena cava is normal in size with <50% respiratory variability, suggesting right atrial pressure of 8 mmHg. FINDINGS  Left Ventricle: Left ventricular ejection fraction, by estimation, is 60 to 65%. The left ventricle has normal function. The left ventricle has no regional wall motion abnormalities. The left ventricular internal cavity size was normal in size. There is  no left ventricular hypertrophy. Left ventricular diastolic function could not be evaluated due to atrial fibrillation. Left ventricular diastolic function could not be evaluated. Right Ventricle: The right ventricular size is normal. No increase in right ventricular wall thickness. Right ventricular systolic function is normal. There is mildly elevated pulmonary artery  systolic pressure. The tricuspid regurgitant velocity is 2.90  m/s, and with an assumed right atrial pressure of 3 mmHg, the estimated right ventricular systolic pressure is 36.6 mmHg. Left Atrium: Left atrial size was mildly dilated. Right Atrium: Right atrial size was normal in size. Pericardium: There is no evidence of pericardial effusion. Mitral Valve: The mitral valve is normal in structure. Mild mitral annular calcification. Mild mitral valve regurgitation. No evidence of mitral valve stenosis. Tricuspid Valve: The tricuspid valve is normal in structure. Tricuspid valve regurgitation is moderate . No evidence of tricuspid stenosis. Aortic Valve: The aortic valve is normal in structure. There is moderate calcification of the aortic valve. Aortic valve regurgitation is not visualized. Mild aortic stenosis is present. Aortic valve mean gradient measures 8.7 mmHg. Aortic valve peak gradient measures 15.7 mmHg. Aortic valve area, by VTI measures 1.52 cm. Pulmonic Valve: The pulmonic valve was normal in structure. Pulmonic valve regurgitation is trivial. No evidence of pulmonic stenosis. Aorta: The aortic root is normal in size and structure. Venous: The inferior vena cava is normal in size with less than 50% respiratory variability, suggesting right atrial pressure of 8 mmHg. IAS/Shunts: No atrial level shunt detected by color flow Doppler.  LEFT VENTRICLE PLAX 2D LVIDd:         4.80 cm     Diastology LVIDs:         3.50 cm     LV e' medial:    19.10 cm/s LV PW:         1.00 cm     LV E/e' medial:  8.3 LV IVS:        0.90 cm     LV e' lateral:   16.10 cm/s LVOT diam:     1.80 cm     LV E/e' lateral: 9.9 LV SV:         56 LV SV Index:   28 LVOT Area:     2.54 cm  LV Volumes (MOD) LV vol d, MOD A2C: 89.1 ml LV vol d, MOD A4C: 83.7 ml LV vol s, MOD A2C: 26.6 ml LV vol s, MOD A4C: 28.7 ml LV SV MOD A2C:     62.5 ml LV SV MOD A4C:  83.7 ml LV SV MOD BP:      59.6 ml RIGHT VENTRICLE             IVC RV Basal diam:   3.80 cm     IVC diam: 1.70 cm RV S prime:     10.60 cm/s TAPSE (M-mode): 2.4 cm LEFT ATRIUM             Index        RIGHT ATRIUM           Index LA diam:        4.50 cm 2.28 cm/m   RA Area:     21.00 cm LA Vol (A2C):   68.1 ml 34.52 ml/m  RA Volume:   63.20 ml  32.04 ml/m LA Vol (A4C):   71.9 ml 36.45 ml/m LA Biplane Vol: 73.2 ml 37.11 ml/m  AORTIC VALVE                     PULMONIC VALVE AV Area (Vmax):    1.59 cm      PV Vmax:       1.65 m/s AV Area (Vmean):   1.51 cm      PV Peak grad:  10.9 mmHg AV Area (VTI):     1.52 cm AV Vmax:           198.00 cm/s AV Vmean:          139.000 cm/s AV VTI:            0.369 m AV Peak Grad:      15.7 mmHg AV Mean Grad:      8.7 mmHg LVOT Vmax:         123.33 cm/s LVOT Vmean:        82.400 cm/s LVOT VTI:          0.220 m LVOT/AV VTI ratio: 0.60  AORTA Ao Root diam: 3.10 cm Ao Asc diam:  3.10 cm MITRAL VALVE                TRICUSPID VALVE MV Area (PHT): 3.95 cm     TR Peak grad:   33.6 mmHg MV Decel Time: 192 msec     TR Vmax:        290.00 cm/s MV E velocity: 159.00 cm/s MV A velocity: 38.40 cm/s   SHUNTS MV E/A ratio:  4.14         Systemic VTI:  0.22 m                             Systemic Diam: 1.80 cm Joelle Azobou Tonleu Electronically signed by Joelle Cedars Tonleu Signature Date/Time: 07/13/2024/11:49:27 AM    Final    CT ABDOMEN PELVIS W CONTRAST Result Date: 07/12/2024 CLINICAL DATA:  Sepsis. Recent right hip surgery. Fever. Scrotal swelling and rash. Recent fall. EXAM: CT ANGIOGRAPHY CHEST CT ABDOMEN AND PELVIS WITH CONTRAST TECHNIQUE: Multidetector CT imaging of the chest was performed using the standard protocol during bolus administration of intravenous contrast. Multiplanar CT image reconstructions and MIPs were obtained to evaluate the vascular anatomy. Multidetector CT imaging of the abdomen and pelvis was performed using the standard protocol during bolus administration of intravenous contrast. RADIATION DOSE REDUCTION: This exam was performed  according to the departmental dose-optimization program which includes automated exposure control, adjustment of the mA and/or kV according to patient size and/or use of iterative reconstruction technique. CONTRAST:  75mL  OMNIPAQUE  IOHEXOL  350 MG/ML SOLN COMPARISON:  CT chest abdomen and pelvis 05/21/2024 FINDINGS: CTA CHEST FINDINGS Cardiovascular: Technically adequate study with good opacification of the central and segmental pulmonary arteries. Moderate motion artifact. No focal filling defects. No evidence of significant pulmonary embolus. Cardiac enlargement. No pericardial effusions. Normal caliber thoracic aorta. No aortic dissection. Great vessel origins are patent. Minimal calcification of the aorta and prominent coronary artery calcification. Mediastinum/Nodes: Esophagus is decompressed. No significant lymphadenopathy. Thyroid  gland is unremarkable. Lungs/Pleura: Large right and small left pleural effusions. Consolidation atelectasis in both lung bases, also greater on the right. Patchy airspace disease in the aerated portions of the lungs likely to represent edema although could indicate pneumonia. No pneumothorax. Musculoskeletal: Degenerative changes in the spine. No acute bony abnormalities. Review of the MIP images confirms the above findings. CT ABDOMEN and PELVIS FINDINGS Hepatobiliary: Cirrhotic changes in the liver with enlarged lateral segment left and caudate lobes and diffuse nodular contour. No focal lesions. Cholelithiasis with mild pericholecystic edema. This could indicate gallbladder inflammation but more likely related to liver disease. No bile duct dilatation. Pancreas: Unremarkable. No pancreatic ductal dilatation or surrounding inflammatory changes. Spleen: Diffuse enlargement of the spleen.  No focal lesions. Adrenals/Urinary Tract: Adrenal glands are unremarkable. Kidneys are normal, without renal calculi, focal lesion, or hydronephrosis. Bladder wall is mildly thickened, possibly  indicating cystitis. Correlate with urinalysis. Stomach/Bowel: Stomach, small bowel, and colon are mostly decompressed. Despite underdistention, there is evidence of wall thickening in the rectosigmoid and descending colon suggesting probable colitis. This could represent infectious or inflammatory colitis. Less likely pseudomembranous colitis. This is new since the previous study. Vascular/Lymphatic: Aortic atherosclerosis. No enlarged abdominal or pelvic lymph nodes. Upper abdominal varices including umbilical vein varices. Reproductive: Prostate gland does not appear enlarged but is somewhat obscured visualization due to streak artifact from right hip arthroplasty component. Other: Small amount of free fluid in the abdomen and pelvis likely ascites. Musculoskeletal: Postoperative right total hip arthroplasty. Degenerative changes in the spine. Mild anterior compression of T12 and L1 vertebrae without change since the prior study. Review of the MIP images confirms the above findings. IMPRESSION: 1. No evidence of significant pulmonary embolus. 2. Cardiac enlargement. 3. Bilateral pleural effusions, greater on the right, with bilateral basilar atelectasis or consolidation. 4. Patchy airspace disease in the lungs likely edema or possibly pneumonia. 5. Hepatic cirrhosis with splenic enlargement and upper abdominal varices. Small amount of abdominal and pelvic ascites. 6. Cholelithiasis. Gallbladder wall thickening is possibly related to gallbladder inflammation but more likely due to liver disease. 7. Diffuse wall thickening of the descending and rectosigmoid colon likely indicating colitis, possibly infectious or inflammatory etiologies. Pseudomembranous colitis also possible but less likely. 8. Aortic atherosclerosis. 9. Mildly thickened bladder wall, possibly cystitis. Correlate with urinalysis. Electronically Signed   By: Elsie Gravely M.D.   On: 07/12/2024 21:13   CT Angio Chest PE W and/or Wo  Contrast Result Date: 07/12/2024 CLINICAL DATA:  Sepsis. Recent right hip surgery. Fever. Scrotal swelling and rash. Recent fall. EXAM: CT ANGIOGRAPHY CHEST CT ABDOMEN AND PELVIS WITH CONTRAST TECHNIQUE: Multidetector CT imaging of the chest was performed using the standard protocol during bolus administration of intravenous contrast. Multiplanar CT image reconstructions and MIPs were obtained to evaluate the vascular anatomy. Multidetector CT imaging of the abdomen and pelvis was performed using the standard protocol during bolus administration of intravenous contrast. RADIATION DOSE REDUCTION: This exam was performed according to the departmental dose-optimization program which includes automated exposure control, adjustment of the  mA and/or kV according to patient size and/or use of iterative reconstruction technique. CONTRAST:  75mL OMNIPAQUE  IOHEXOL  350 MG/ML SOLN COMPARISON:  CT chest abdomen and pelvis 05/21/2024 FINDINGS: CTA CHEST FINDINGS Cardiovascular: Technically adequate study with good opacification of the central and segmental pulmonary arteries. Moderate motion artifact. No focal filling defects. No evidence of significant pulmonary embolus. Cardiac enlargement. No pericardial effusions. Normal caliber thoracic aorta. No aortic dissection. Great vessel origins are patent. Minimal calcification of the aorta and prominent coronary artery calcification. Mediastinum/Nodes: Esophagus is decompressed. No significant lymphadenopathy. Thyroid  gland is unremarkable. Lungs/Pleura: Large right and small left pleural effusions. Consolidation atelectasis in both lung bases, also greater on the right. Patchy airspace disease in the aerated portions of the lungs likely to represent edema although could indicate pneumonia. No pneumothorax. Musculoskeletal: Degenerative changes in the spine. No acute bony abnormalities. Review of the MIP images confirms the above findings. CT ABDOMEN and PELVIS FINDINGS  Hepatobiliary: Cirrhotic changes in the liver with enlarged lateral segment left and caudate lobes and diffuse nodular contour. No focal lesions. Cholelithiasis with mild pericholecystic edema. This could indicate gallbladder inflammation but more likely related to liver disease. No bile duct dilatation. Pancreas: Unremarkable. No pancreatic ductal dilatation or surrounding inflammatory changes. Spleen: Diffuse enlargement of the spleen.  No focal lesions. Adrenals/Urinary Tract: Adrenal glands are unremarkable. Kidneys are normal, without renal calculi, focal lesion, or hydronephrosis. Bladder wall is mildly thickened, possibly indicating cystitis. Correlate with urinalysis. Stomach/Bowel: Stomach, small bowel, and colon are mostly decompressed. Despite underdistention, there is evidence of wall thickening in the rectosigmoid and descending colon suggesting probable colitis. This could represent infectious or inflammatory colitis. Less likely pseudomembranous colitis. This is new since the previous study. Vascular/Lymphatic: Aortic atherosclerosis. No enlarged abdominal or pelvic lymph nodes. Upper abdominal varices including umbilical vein varices. Reproductive: Prostate gland does not appear enlarged but is somewhat obscured visualization due to streak artifact from right hip arthroplasty component. Other: Small amount of free fluid in the abdomen and pelvis likely ascites. Musculoskeletal: Postoperative right total hip arthroplasty. Degenerative changes in the spine. Mild anterior compression of T12 and L1 vertebrae without change since the prior study. Review of the MIP images confirms the above findings. IMPRESSION: 1. No evidence of significant pulmonary embolus. 2. Cardiac enlargement. 3. Bilateral pleural effusions, greater on the right, with bilateral basilar atelectasis or consolidation. 4. Patchy airspace disease in the lungs likely edema or possibly pneumonia. 5. Hepatic cirrhosis with splenic  enlargement and upper abdominal varices. Small amount of abdominal and pelvic ascites. 6. Cholelithiasis. Gallbladder wall thickening is possibly related to gallbladder inflammation but more likely due to liver disease. 7. Diffuse wall thickening of the descending and rectosigmoid colon likely indicating colitis, possibly infectious or inflammatory etiologies. Pseudomembranous colitis also possible but less likely. 8. Aortic atherosclerosis. 9. Mildly thickened bladder wall, possibly cystitis. Correlate with urinalysis. Electronically Signed   By: Elsie Gravely M.D.   On: 07/12/2024 21:13   DG Pelvis Portable Result Date: 07/12/2024 CLINICAL DATA:  fall EXAM: PORTABLE PELVIS 1-2 VIEWS COMPARISON:  X-ray pelvis 05/21/2024. FINDINGS: Total right hip arthroplasty partially visualized. No radiographic findings suggest surgical hardware complication. There is no evidence of pelvic fracture or diastasis. No pelvic bone lesions are seen. Vascular calcifications. Gaseous distension of a loop of bowel within the right mid abdomen. IMPRESSION: Negative for acute traumatic injury. Electronically Signed   By: Morgane  Naveau M.D.   On: 07/12/2024 19:53   DG Chest Port 1 View if patient is  in a treatment room. Result Date: 07/12/2024 CLINICAL DATA:  Suspected Sepsis fall EXAM: PORTABLE CHEST 1 VIEW COMPARISON:  Chest x-ray 05/21/2024 FINDINGS: The heart and mediastinal contours are unchanged. No focal consolidation. No pulmonary edema. Interval development of at least small to moderate right and trace to small left pleural effusions. No pneumothorax. No acute osseous abnormality. IMPRESSION: Interval development of at least small to moderate right and trace to small left pleural effusions. Question loculation on the right. Recommend CT chest with intravenous contrast (not CT PA) for further evaluation. Electronically Signed   By: Morgane  Naveau M.D.   On: 07/12/2024 19:52    Microbiology: Recent Results (from the  past 240 hours)  Culture, blood (Routine x 2)     Status: Abnormal   Collection Time: 07/12/24  6:33 PM   Specimen: BLOOD  Result Value Ref Range Status   Specimen Description BLOOD SITE NOT SPECIFIED  Final   Special Requests   Final    BOTTLES DRAWN AEROBIC AND ANAEROBIC Blood Culture results may not be optimal due to an inadequate volume of blood received in culture bottles   Culture  Setup Time   Final    GRAM NEGATIVE RODS ANAEROBIC BOTTLE ONLY CRITICAL RESULT CALLED TO, READ BACK BY AND VERIFIED WITH: PHARMD B.WANNARAT AT 0945 ON 07/14/2024 BY T.SAAD.    Culture (A)  Final    CAMPYLOBACTER COLI Standardized susceptibility testing for this organism is not available. Performed at [facility], [address] [place], [place] [address]    Report Status 07/16/2024 FINAL  Final  Blood Culture ID Panel (Reflexed)     Status: None   Collection Time: 07/12/24  6:33 PM  Result Value Ref Range Status   Enterococcus faecalis NOT DETECTED NOT DETECTED Final   Enterococcus Faecium NOT DETECTED NOT DETECTED Final   Listeria monocytogenes NOT DETECTED NOT DETECTED Final   Staphylococcus species NOT DETECTED NOT DETECTED Final   Staphylococcus aureus (BCID) NOT DETECTED NOT DETECTED Final   Staphylococcus epidermidis NOT DETECTED NOT DETECTED Final   Staphylococcus lugdunensis NOT DETECTED NOT DETECTED Final   Streptococcus species NOT DETECTED NOT DETECTED Final   Streptococcus agalactiae NOT DETECTED NOT DETECTED Final   Streptococcus pneumoniae NOT DETECTED NOT DETECTED Final   Streptococcus pyogenes NOT DETECTED NOT DETECTED Final   A.calcoaceticus-baumannii NOT DETECTED NOT DETECTED Final   Bacteroides fragilis NOT DETECTED NOT DETECTED Final   Enterobacterales NOT DETECTED NOT DETECTED Final   Enterobacter cloacae complex NOT DETECTED NOT DETECTED Final   Escherichia coli NOT DETECTED NOT DETECTED Final   Klebsiella aerogenes NOT DETECTED NOT DETECTED Final   Klebsiella  oxytoca NOT DETECTED NOT DETECTED Final   Klebsiella pneumoniae NOT DETECTED NOT DETECTED Final   Proteus species NOT DETECTED NOT DETECTED Final   Salmonella species NOT DETECTED NOT DETECTED Final   Serratia marcescens NOT DETECTED NOT DETECTED Final   Haemophilus influenzae NOT DETECTED NOT DETECTED Final   Neisseria meningitidis NOT DETECTED NOT DETECTED Final   Pseudomonas aeruginosa NOT DETECTED NOT DETECTED Final   Stenotrophomonas maltophilia NOT DETECTED NOT DETECTED Final   Candida albicans NOT DETECTED NOT DETECTED Final   Candida auris NOT DETECTED NOT DETECTED Final   Candida glabrata NOT DETECTED NOT DETECTED Final   Candida krusei NOT DETECTED NOT DETECTED Final   Candida parapsilosis NOT DETECTED NOT DETECTED Final   Candida tropicalis NOT DETECTED NOT DETECTED Final   Cryptococcus neoformans/gattii NOT DETECTED NOT DETECTED Final    Comment: Performed  at [facility], [address], [place], [place] [address]  Culture, blood (Routine x 2)     Status: None (Preliminary result)   Collection Time: 07/12/24  7:30 PM   Specimen: BLOOD  Result Value Ref Range Status   Specimen Description BLOOD SITE NOT SPECIFIED  Final   Special Requests   Final    BOTTLES DRAWN AEROBIC ONLY Blood Culture results may not be optimal due to an inadequate volume of blood received in culture bottles   Culture  Setup Time   Final    GRAM NEGATIVE RODS AEROBIC BOTTLE ONLY CRITICAL RESULT CALLED TO, READ BACK BY AND VERIFIED WITH: PHARMD BLAKE WANNARAT ON 07/15/24 @ 1535 BY DRT Performed at [facility], [address], [place], [place] [address]    Culture GRAM NEGATIVE RODS  Final   Report Status PENDING  Incomplete  Resp panel by RT-PCR (RSV, Flu A&B, Covid) Anterior Nasal Swab     Status: None   Collection Time: 07/12/24  7:49 PM   Specimen: Anterior Nasal Swab  Result Value Ref Range Status   SARS Coronavirus 2 by RT PCR NEGATIVE NEGATIVE Final   Influenza A by PCR NEGATIVE  NEGATIVE Final   Influenza B by PCR NEGATIVE NEGATIVE Final    Comment: (NOTE) The Xpert Xpress SARS-CoV-2/FLU/RSV plus assay is intended as an aid in the diagnosis of influenza from Nasopharyngeal swab specimens and should not be used as a sole basis for treatment. Nasal washings and aspirates are unacceptable for Xpert Xpress SARS-CoV-2/FLU/RSV testing.  Fact Sheet for Patients: BloggerCourse.com  Fact Sheet for Healthcare Providers: SeriousBroker.it  This test is not yet approved or cleared by the [place]  FDA and has been authorized for detection and/or diagnosis of SARS-CoV-2 by FDA under an Emergency Use Authorization (EUA). This EUA will remain in effect (meaning this test can be used) for the duration of the COVID-19 declaration under Section 564(b)(1) of the Act, 21 U.S.C. section 360bbb-3(b)(1), unless the authorization is terminated or revoked.     Resp Syncytial Virus by PCR NEGATIVE NEGATIVE Final    Comment: (NOTE) Fact Sheet for Patients: BloggerCourse.com  Fact Sheet for Healthcare Providers: SeriousBroker.it  This test is not yet approved or cleared by the [place]  FDA and has been authorized for detection and/or diagnosis of SARS-CoV-2 by FDA under an Emergency Use Authorization (EUA). This EUA will remain in effect (meaning this test can be used) for the duration of the COVID-19 declaration under Section 564(b)(1) of the Act, 21 U.S.C. section 360bbb-3(b)(1), unless the authorization is terminated or revoked.  Performed at [facility], [address] [place], [place] [address]   Urine Culture     Status: Abnormal   Collection Time: 07/12/24  7:49 PM   Specimen: Urine, Clean Catch  Result Value Ref Range Status   Specimen Description URINE, CLEAN CATCH  Final   Special Requests   Final    NONE Reflexed from [id number] Performed at [facility], [address] [place], [place] [address]    Culture (A)  Final    >=100,000 COLONIES/mL MULTIPLE SPECIES PRESENT, SUGGEST RECOLLECTION   Report Status 07/14/2024 FINAL  Final  C Difficile Quick Screen w PCR reflex     Status: None   Collection Time: 07/13/24  2:30 AM   Specimen: STOOL  Result Value Ref Range Status   C Diff antigen NEGATIVE NEGATIVE Final   C Diff toxin NEGATIVE NEGATIVE Final  C Diff interpretation No C. difficile detected.  Final    Comment: Performed at [facility], [address] [place], [place] [address]  Gastrointestinal Panel by PCR , Stool     Status: Abnormal   Collection Time: 07/13/24  2:30 AM   Specimen: STOOL  Result Value Ref Range Status   Campylobacter species DETECTED (A) NOT DETECTED Final    Comment: CRITICAL RESULT CALLED TO, READ BACK BY AND VERIFIED WITH: Lilly Malcomb 07/13/24 11:18 tdl    Plesimonas shigelloides NOT DETECTED NOT DETECTED Final   Salmonella species NOT DETECTED NOT DETECTED Final   Yersinia enterocolitica NOT DETECTED NOT DETECTED Final   Vibrio species NOT DETECTED NOT DETECTED Final   Vibrio cholerae NOT DETECTED NOT DETECTED Final   Enteroaggregative E coli (EAEC) NOT DETECTED NOT DETECTED Final   Enteropathogenic E coli (EPEC) DETECTED (A) NOT DETECTED Final    Comment: RESULT CALLED TO, READ BACK BY AND VERIFIED WITH: Lilly Malcomb 07/13/24 11:18 tdl    Enterotoxigenic E coli (ETEC) DETECTED (A) NOT DETECTED Final    Comment: RESULT CALLED TO, READ BACK BY AND VERIFIED WITH: Lilly Malcomb 07/13/24 11:18 tdl    Shiga like toxin producing E coli (STEC) NOT DETECTED NOT DETECTED Final   Shigella/Enteroinvasive E coli (EIEC) NOT DETECTED NOT DETECTED Final   Cryptosporidium NOT DETECTED NOT DETECTED Final   Cyclospora cayetanensis NOT DETECTED NOT DETECTED Final   Entamoeba histolytica NOT DETECTED NOT DETECTED Final   Giardia lamblia NOT DETECTED NOT DETECTED Final   Adenovirus F40/41 NOT  DETECTED NOT DETECTED Final   Astrovirus NOT DETECTED NOT DETECTED Final   Norovirus GI/GII DETECTED (A) NOT DETECTED Final    Comment: RESULT CALLED TO, READ BACK BY AND VERIFIED WITH: Lilly Malcomb 07/13/24 11:18 tdl    Rotavirus A NOT DETECTED NOT DETECTED Final   Sapovirus (I, II, IV, and V) NOT DETECTED NOT DETECTED Final    Comment: Performed at [facility], [address] [place], [place] [address]     Labs: Basic Metabolic Panel: Recent Labs  Lab 07/12/24 1915 07/12/24 1923 07/13/24 0633 07/14/24 0256 07/15/24 0924 07/16/24 0222  NA 127* 129* 127* 129* 129* 127*  K 3.3* 3.2* 3.4* 2.7* 3.6 3.2*  CL 96*  --  95* 97* 97* 97*  CO2 19*  --  21* 23 22 20*  GLUCOSE 118*  --  117* 108* 101* 106*  BUN <5*  --  6* 7* 5* 6*  CREATININE 0.61  --  0.60* 0.50* 0.44* 0.45*  CALCIUM 8.4*  --  7.8* 7.7* 8.2* 8.0*  MG 1.8  --  1.9 1.8  --   --    Liver Function Tests: Recent Labs  Lab 07/12/24 1915 07/13/24 0633 07/14/24 0256 07/15/24 0924 07/16/24 0222  AST 37 36 24 100* 89*  ALT 18 17 14  40 43  ALKPHOS 127* 93 85 204* 194*  BILITOT 1.8* 1.5* 0.9 3.6* 2.5*  PROT 7.0 5.6* 5.2* 5.9* 5.8*  ALBUMIN  3.3* 2.6* 2.4* 2.6* 2.6*   No results for input(s): LIPASE, AMYLASE in the last 168 hours. Recent Labs  Lab 07/13/24 1928  AMMONIA 23   CBC: Recent Labs  Lab 07/12/24 1915 07/12/24 1923 07/13/24 0633 07/14/24 0256 07/16/24 0222  WBC 8.7  --  8.7 5.1 3.9*  NEUTROABS 7.9*  --   --   --  2.2  HGB 10.7* 11.9* 9.2* 8.6* 9.4*  HCT 33.4* 35.0* 28.1* 25.9* 28.3*  MCV 100.9*  --  100.0 98.5 97.9  PLT 192  --  143* 124* 128*   Cardiac Enzymes: No results for input(s): CKTOTAL, CKMB, CKMBINDEX, TROPONINI in the last 168 hours. BNP: BNP (last 3 results) Recent Labs    06/12/24 0650 07/12/24 1915  BNP 382.6* 365.0*    ProBNP (last 3 results) No results for input(s): PROBNP in the last 8760 hours.  CBG: Recent Labs  Lab 07/12/24 1843   GLUCAP 119*    Signed:  Colen Grimes MD   [facility] 07/16/2024, 9:07 AM

## 2024-07-16 NOTE — Progress Notes (Signed)
 Heart Failure Navigator Progress Note  Assessed for Heart & Vascular TOC clinic readiness.  Patient does not meet criteria due to EF 60-65%, per MD patient to follow up with GI after discharge. No HF tOC. .   Navigator available for reassessment of patient.   Stephane Haddock, BSN, Scientist, clinical (histocompatibility and immunogenetics) Only

## 2024-07-16 NOTE — TOC Transition Note (Addendum)
 Transition of Care [facility]) - Discharge Note   Patient Details  Name: Evert Wenrich MRN: [id number] Date of Birth: 1956-07-27  [facility] CM/SW Contact:  Corean JAYSON Canary, RN Phone Number: 07/16/2024, 9:48 AM   Clinical Narrative:     Patient for discharge today. Nursing reports that the paitnet was sitting in stool, patient has a outhouse at home, bedside commode ordered from [facility]   Final next level of care: Home/Self Care Barriers to Discharge: No Barriers Identified   Patient Goals and CMS Choice            Discharge Placement                       Discharge Plan and Services Additional resources added to the After Visit Summary for     Discharge Planning Services: CM Consult                                 Social Drivers of Health (SDOH) Interventions SDOH Screenings   Food Insecurity: No Food Insecurity (07/13/2024)  Housing: Low Risk  (07/13/2024)  Transportation Needs: No Transportation Needs (07/13/2024)  Utilities: Not At Risk (07/13/2024)  Alcohol Screen: Low Risk  (05/30/2023)  Social Connections: Moderately Isolated (07/13/2024)  Tobacco Use: Medium Risk (07/12/2024)     Readmission Risk Interventions    05/09/2023    2:37 PM  Readmission Risk Prevention Plan  Post Dischage Appt Complete  Medication Screening Complete  Transportation Screening Complete

## 2024-07-17 LAB — CULTURE, BLOOD (ROUTINE X 2)

## 2024-07-20 DIAGNOSIS — E538 Deficiency of other specified B group vitamins: Secondary | ICD-10-CM | POA: Diagnosis not present

## 2024-07-20 DIAGNOSIS — I4891 Unspecified atrial fibrillation: Secondary | ICD-10-CM | POA: Diagnosis not present

## 2024-07-20 DIAGNOSIS — R188 Other ascites: Secondary | ICD-10-CM | POA: Diagnosis not present

## 2024-07-20 DIAGNOSIS — A045 Campylobacter enteritis: Secondary | ICD-10-CM | POA: Diagnosis not present

## 2024-07-20 DIAGNOSIS — I5030 Unspecified diastolic (congestive) heart failure: Secondary | ICD-10-CM | POA: Diagnosis not present

## 2024-07-20 DIAGNOSIS — Z79899 Other long term (current) drug therapy: Secondary | ICD-10-CM | POA: Diagnosis not present

## 2024-07-20 DIAGNOSIS — S72031D Displaced midcervical fracture of right femur, subsequent encounter for closed fracture with routine healing: Secondary | ICD-10-CM | POA: Diagnosis not present

## 2024-07-20 DIAGNOSIS — A419 Sepsis, unspecified organism: Secondary | ICD-10-CM | POA: Diagnosis not present

## 2024-07-20 DIAGNOSIS — K7460 Unspecified cirrhosis of liver: Secondary | ICD-10-CM | POA: Diagnosis not present

## 2024-07-20 DIAGNOSIS — E871 Hypo-osmolality and hyponatremia: Secondary | ICD-10-CM | POA: Diagnosis not present

## 2024-07-20 DIAGNOSIS — E559 Vitamin D deficiency, unspecified: Secondary | ICD-10-CM | POA: Diagnosis not present

## 2024-07-20 DIAGNOSIS — E876 Hypokalemia: Secondary | ICD-10-CM | POA: Diagnosis not present

## 2024-10-20 ENCOUNTER — Other Ambulatory Visit: Payer: Self-pay

## 2024-11-23 ENCOUNTER — Ambulatory Visit
